# Patient Record
Sex: Female | Born: 1960 | Race: White | Hispanic: No | Marital: Single | Smoking: Current every day smoker
Health system: Southern US, Community
[De-identification: ages and names within clinical notes are randomized; demographics above are authoritative.]

## PROBLEM LIST (undated history)

## (undated) DIAGNOSIS — D649 Anemia, unspecified: Secondary | ICD-10-CM

## (undated) DIAGNOSIS — R0602 Shortness of breath: Secondary | ICD-10-CM

## (undated) DIAGNOSIS — F419 Anxiety disorder, unspecified: Secondary | ICD-10-CM

## (undated) DIAGNOSIS — F329 Major depressive disorder, single episode, unspecified: Secondary | ICD-10-CM

## (undated) DIAGNOSIS — J449 Chronic obstructive pulmonary disease, unspecified: Secondary | ICD-10-CM

## (undated) DIAGNOSIS — M5136 Other intervertebral disc degeneration, lumbar region: Secondary | ICD-10-CM

## (undated) DIAGNOSIS — D473 Essential (hemorrhagic) thrombocythemia: Secondary | ICD-10-CM

## (undated) DIAGNOSIS — F32A Depression, unspecified: Secondary | ICD-10-CM

## (undated) DIAGNOSIS — K219 Gastro-esophageal reflux disease without esophagitis: Secondary | ICD-10-CM

## (undated) DIAGNOSIS — D75839 Thrombocytosis, unspecified: Secondary | ICD-10-CM

## (undated) DIAGNOSIS — M519 Unspecified thoracic, thoracolumbar and lumbosacral intervertebral disc disorder: Secondary | ICD-10-CM

## (undated) DIAGNOSIS — K7460 Unspecified cirrhosis of liver: Secondary | ICD-10-CM

## (undated) DIAGNOSIS — E039 Hypothyroidism, unspecified: Secondary | ICD-10-CM

## (undated) DIAGNOSIS — K766 Portal hypertension: Secondary | ICD-10-CM

## (undated) DIAGNOSIS — J45909 Unspecified asthma, uncomplicated: Secondary | ICD-10-CM

## (undated) DIAGNOSIS — D72829 Elevated white blood cell count, unspecified: Secondary | ICD-10-CM

## (undated) DIAGNOSIS — I10 Essential (primary) hypertension: Secondary | ICD-10-CM

## (undated) DIAGNOSIS — M51369 Other intervertebral disc degeneration, lumbar region without mention of lumbar back pain or lower extremity pain: Secondary | ICD-10-CM

## (undated) HISTORY — DX: Unspecified cirrhosis of liver: K74.60

## (undated) HISTORY — PX: BRAIN SURGERY: SHX531

## (undated) HISTORY — DX: Thrombocytosis, unspecified: D75.839

## (undated) HISTORY — PX: APPENDECTOMY: SHX54

## (undated) HISTORY — DX: Elevated white blood cell count, unspecified: D72.829

## (undated) HISTORY — DX: Gastro-esophageal reflux disease without esophagitis: K21.9

## (undated) HISTORY — DX: Essential (hemorrhagic) thrombocythemia: D47.3

## (undated) HISTORY — PX: ABDOMINAL HYSTERECTOMY: SHX81

## (undated) HISTORY — PX: OTHER SURGICAL HISTORY: SHX169

## (undated) HISTORY — DX: Unspecified thoracic, thoracolumbar and lumbosacral intervertebral disc disorder: M51.9

## (undated) HISTORY — PX: TOTAL ABDOMINAL HYSTERECTOMY W/ BILATERAL SALPINGOOPHORECTOMY: SHX83

## (undated) HISTORY — PX: BACK SURGERY: SHX140

---

## 1998-05-07 ENCOUNTER — Emergency Department (HOSPITAL_COMMUNITY): Admission: EM | Admit: 1998-05-07 | Discharge: 1998-05-07 | Payer: Self-pay | Admitting: Emergency Medicine

## 1999-02-21 ENCOUNTER — Emergency Department (HOSPITAL_COMMUNITY): Admission: EM | Admit: 1999-02-21 | Discharge: 1999-02-21 | Payer: Self-pay | Admitting: Emergency Medicine

## 1999-02-22 ENCOUNTER — Encounter: Payer: Self-pay | Admitting: Emergency Medicine

## 1999-03-05 ENCOUNTER — Emergency Department (HOSPITAL_COMMUNITY): Admission: EM | Admit: 1999-03-05 | Discharge: 1999-03-05 | Payer: Self-pay | Admitting: Emergency Medicine

## 1999-07-02 ENCOUNTER — Emergency Department (HOSPITAL_COMMUNITY): Admission: EM | Admit: 1999-07-02 | Discharge: 1999-07-02 | Payer: Self-pay | Admitting: Emergency Medicine

## 1999-07-02 ENCOUNTER — Encounter: Payer: Self-pay | Admitting: Emergency Medicine

## 1999-10-04 ENCOUNTER — Ambulatory Visit (HOSPITAL_COMMUNITY): Admission: RE | Admit: 1999-10-04 | Discharge: 1999-10-04 | Payer: Self-pay | Admitting: Otolaryngology

## 1999-10-04 ENCOUNTER — Encounter: Payer: Self-pay | Admitting: Otolaryngology

## 1999-10-20 ENCOUNTER — Emergency Department (HOSPITAL_COMMUNITY): Admission: EM | Admit: 1999-10-20 | Discharge: 1999-10-20 | Payer: Self-pay | Admitting: Emergency Medicine

## 1999-12-04 ENCOUNTER — Ambulatory Visit (HOSPITAL_BASED_OUTPATIENT_CLINIC_OR_DEPARTMENT_OTHER): Admission: RE | Admit: 1999-12-04 | Discharge: 1999-12-04 | Payer: Self-pay | Admitting: Otolaryngology

## 2000-04-07 ENCOUNTER — Emergency Department (HOSPITAL_COMMUNITY): Admission: EM | Admit: 2000-04-07 | Discharge: 2000-04-08 | Payer: Self-pay | Admitting: Emergency Medicine

## 2000-04-08 ENCOUNTER — Encounter: Payer: Self-pay | Admitting: Emergency Medicine

## 2000-05-25 ENCOUNTER — Emergency Department (HOSPITAL_COMMUNITY): Admission: EM | Admit: 2000-05-25 | Discharge: 2000-05-25 | Payer: Self-pay | Admitting: Emergency Medicine

## 2000-08-23 ENCOUNTER — Emergency Department (HOSPITAL_COMMUNITY): Admission: EM | Admit: 2000-08-23 | Discharge: 2000-08-23 | Payer: Self-pay | Admitting: Emergency Medicine

## 2000-08-23 ENCOUNTER — Encounter: Payer: Self-pay | Admitting: Emergency Medicine

## 2000-09-22 ENCOUNTER — Emergency Department (HOSPITAL_COMMUNITY): Admission: EM | Admit: 2000-09-22 | Discharge: 2000-09-22 | Payer: Self-pay | Admitting: *Deleted

## 2000-09-27 ENCOUNTER — Ambulatory Visit (HOSPITAL_COMMUNITY): Admission: RE | Admit: 2000-09-27 | Discharge: 2000-09-27 | Payer: Self-pay | Admitting: Family Medicine

## 2000-09-27 ENCOUNTER — Encounter: Payer: Self-pay | Admitting: Family Medicine

## 2000-09-30 ENCOUNTER — Encounter: Payer: Self-pay | Admitting: Neurosurgery

## 2000-09-30 ENCOUNTER — Ambulatory Visit (HOSPITAL_COMMUNITY): Admission: RE | Admit: 2000-09-30 | Discharge: 2000-09-30 | Payer: Self-pay | Admitting: Neurosurgery

## 2000-10-11 ENCOUNTER — Encounter: Payer: Self-pay | Admitting: Neurosurgery

## 2000-10-11 ENCOUNTER — Encounter: Admission: RE | Admit: 2000-10-11 | Discharge: 2000-10-11 | Payer: Self-pay | Admitting: Neurosurgery

## 2000-10-25 ENCOUNTER — Encounter: Payer: Self-pay | Admitting: Neurosurgery

## 2000-10-25 ENCOUNTER — Encounter: Admission: RE | Admit: 2000-10-25 | Discharge: 2000-10-25 | Payer: Self-pay | Admitting: Neurosurgery

## 2000-11-01 ENCOUNTER — Encounter: Payer: Self-pay | Admitting: Neurosurgery

## 2000-11-01 ENCOUNTER — Encounter: Admission: RE | Admit: 2000-11-01 | Discharge: 2000-11-01 | Payer: Self-pay | Admitting: Neurosurgery

## 2000-11-14 ENCOUNTER — Encounter: Payer: Self-pay | Admitting: Neurosurgery

## 2000-11-14 ENCOUNTER — Encounter: Admission: RE | Admit: 2000-11-14 | Discharge: 2000-11-14 | Payer: Self-pay | Admitting: Neurosurgery

## 2001-01-01 ENCOUNTER — Encounter: Admission: RE | Admit: 2001-01-01 | Discharge: 2001-01-01 | Payer: Self-pay | Admitting: Neurosurgery

## 2001-01-02 ENCOUNTER — Encounter: Admission: RE | Admit: 2001-01-02 | Discharge: 2001-01-07 | Payer: Self-pay | Admitting: Neurosurgery

## 2001-11-29 ENCOUNTER — Emergency Department (HOSPITAL_COMMUNITY): Admission: EM | Admit: 2001-11-29 | Discharge: 2001-11-29 | Payer: Self-pay | Admitting: Emergency Medicine

## 2001-11-29 ENCOUNTER — Encounter: Payer: Self-pay | Admitting: Emergency Medicine

## 2001-12-12 ENCOUNTER — Encounter: Payer: Self-pay | Admitting: Family Medicine

## 2001-12-12 ENCOUNTER — Ambulatory Visit (HOSPITAL_COMMUNITY): Admission: RE | Admit: 2001-12-12 | Discharge: 2001-12-12 | Payer: Self-pay | Admitting: Family Medicine

## 2002-02-05 ENCOUNTER — Encounter: Admission: RE | Admit: 2002-02-05 | Discharge: 2002-02-05 | Payer: Self-pay | Admitting: Neurology

## 2002-02-05 ENCOUNTER — Encounter: Payer: Self-pay | Admitting: Neurology

## 2002-02-12 ENCOUNTER — Encounter: Payer: Self-pay | Admitting: Neurology

## 2002-02-12 ENCOUNTER — Ambulatory Visit (HOSPITAL_COMMUNITY): Admission: RE | Admit: 2002-02-12 | Discharge: 2002-02-12 | Payer: Self-pay | Admitting: Neurology

## 2002-03-04 ENCOUNTER — Encounter: Payer: Self-pay | Admitting: Neurology

## 2002-03-04 ENCOUNTER — Ambulatory Visit (HOSPITAL_COMMUNITY): Admission: RE | Admit: 2002-03-04 | Discharge: 2002-03-04 | Payer: Self-pay | Admitting: Neurology

## 2002-03-30 ENCOUNTER — Ambulatory Visit (HOSPITAL_COMMUNITY): Admission: RE | Admit: 2002-03-30 | Discharge: 2002-03-30 | Payer: Self-pay | Admitting: Family Medicine

## 2002-03-30 ENCOUNTER — Encounter: Payer: Self-pay | Admitting: Family Medicine

## 2002-05-14 ENCOUNTER — Encounter: Payer: Self-pay | Admitting: Family Medicine

## 2002-05-14 ENCOUNTER — Ambulatory Visit (HOSPITAL_COMMUNITY): Admission: RE | Admit: 2002-05-14 | Discharge: 2002-05-14 | Payer: Self-pay | Admitting: Family Medicine

## 2002-06-18 ENCOUNTER — Ambulatory Visit (HOSPITAL_COMMUNITY): Admission: RE | Admit: 2002-06-18 | Discharge: 2002-06-18 | Payer: Self-pay | Admitting: Neurology

## 2002-07-09 ENCOUNTER — Encounter: Admission: RE | Admit: 2002-07-09 | Discharge: 2002-10-07 | Payer: Self-pay

## 2002-07-29 ENCOUNTER — Emergency Department (HOSPITAL_COMMUNITY): Admission: EM | Admit: 2002-07-29 | Discharge: 2002-07-29 | Payer: Self-pay | Admitting: Emergency Medicine

## 2002-07-29 ENCOUNTER — Encounter: Payer: Self-pay | Admitting: Emergency Medicine

## 2002-10-28 ENCOUNTER — Encounter: Payer: Self-pay | Admitting: Family Medicine

## 2002-10-28 ENCOUNTER — Encounter: Admission: RE | Admit: 2002-10-28 | Discharge: 2002-10-28 | Payer: Self-pay | Admitting: Family Medicine

## 2003-01-19 ENCOUNTER — Encounter: Payer: Self-pay | Admitting: Family Medicine

## 2003-01-19 ENCOUNTER — Ambulatory Visit (HOSPITAL_COMMUNITY): Admission: RE | Admit: 2003-01-19 | Discharge: 2003-01-19 | Payer: Self-pay | Admitting: Family Medicine

## 2003-02-08 ENCOUNTER — Emergency Department (HOSPITAL_COMMUNITY): Admission: EM | Admit: 2003-02-08 | Discharge: 2003-02-08 | Payer: Self-pay | Admitting: *Deleted

## 2003-02-08 ENCOUNTER — Encounter: Payer: Self-pay | Admitting: *Deleted

## 2003-02-10 ENCOUNTER — Inpatient Hospital Stay (HOSPITAL_COMMUNITY): Admission: EM | Admit: 2003-02-10 | Discharge: 2003-02-11 | Payer: Self-pay | Admitting: Emergency Medicine

## 2003-02-10 ENCOUNTER — Encounter: Payer: Self-pay | Admitting: Emergency Medicine

## 2003-03-26 ENCOUNTER — Encounter: Payer: Self-pay | Admitting: Neurological Surgery

## 2003-03-26 ENCOUNTER — Encounter: Admission: RE | Admit: 2003-03-26 | Discharge: 2003-03-26 | Payer: Self-pay | Admitting: Neurological Surgery

## 2003-03-29 ENCOUNTER — Emergency Department (HOSPITAL_COMMUNITY): Admission: EM | Admit: 2003-03-29 | Discharge: 2003-03-29 | Payer: Self-pay | Admitting: Emergency Medicine

## 2003-03-29 ENCOUNTER — Ambulatory Visit: Admission: RE | Admit: 2003-03-29 | Discharge: 2003-03-29 | Payer: Self-pay | Admitting: Neurosurgery

## 2003-03-29 ENCOUNTER — Encounter: Payer: Self-pay | Admitting: Emergency Medicine

## 2003-05-21 ENCOUNTER — Encounter: Admission: RE | Admit: 2003-05-21 | Discharge: 2003-05-21 | Payer: Self-pay | Admitting: Neurological Surgery

## 2003-06-07 ENCOUNTER — Emergency Department (HOSPITAL_COMMUNITY): Admission: EM | Admit: 2003-06-07 | Discharge: 2003-06-07 | Payer: Self-pay | Admitting: Emergency Medicine

## 2003-08-06 ENCOUNTER — Ambulatory Visit (HOSPITAL_COMMUNITY): Admission: RE | Admit: 2003-08-06 | Discharge: 2003-08-06 | Payer: Self-pay | Admitting: Family Medicine

## 2003-12-08 ENCOUNTER — Encounter (HOSPITAL_COMMUNITY): Admission: RE | Admit: 2003-12-08 | Discharge: 2004-01-07 | Payer: Self-pay | Admitting: Family Medicine

## 2003-12-20 ENCOUNTER — Ambulatory Visit (HOSPITAL_COMMUNITY): Admission: RE | Admit: 2003-12-20 | Discharge: 2003-12-20 | Payer: Self-pay | Admitting: Family Medicine

## 2004-02-28 ENCOUNTER — Ambulatory Visit: Payer: Self-pay | Admitting: Psychiatry

## 2004-03-07 ENCOUNTER — Ambulatory Visit: Payer: Self-pay | Admitting: Psychiatry

## 2004-04-24 ENCOUNTER — Ambulatory Visit: Payer: Self-pay | Admitting: Psychiatry

## 2004-05-03 ENCOUNTER — Ambulatory Visit: Payer: Self-pay | Admitting: Psychiatry

## 2004-07-25 ENCOUNTER — Ambulatory Visit: Payer: Self-pay | Admitting: Psychiatry

## 2005-06-29 ENCOUNTER — Emergency Department (HOSPITAL_COMMUNITY): Admission: EM | Admit: 2005-06-29 | Discharge: 2005-06-29 | Payer: Self-pay | Admitting: Emergency Medicine

## 2005-08-03 ENCOUNTER — Inpatient Hospital Stay (HOSPITAL_COMMUNITY): Admission: EM | Admit: 2005-08-03 | Discharge: 2005-08-09 | Payer: Self-pay | Admitting: Emergency Medicine

## 2005-08-08 ENCOUNTER — Ambulatory Visit: Payer: Self-pay | Admitting: Internal Medicine

## 2005-08-08 HISTORY — PX: ESOPHAGOGASTRODUODENOSCOPY: SHX1529

## 2005-10-02 ENCOUNTER — Ambulatory Visit (HOSPITAL_COMMUNITY): Admission: RE | Admit: 2005-10-02 | Discharge: 2005-10-02 | Payer: Self-pay | Admitting: Family Medicine

## 2005-10-05 ENCOUNTER — Ambulatory Visit (HOSPITAL_COMMUNITY): Payer: Self-pay | Admitting: Psychiatry

## 2005-10-09 ENCOUNTER — Ambulatory Visit (HOSPITAL_COMMUNITY): Payer: Self-pay | Admitting: Psychiatry

## 2005-10-11 ENCOUNTER — Ambulatory Visit (HOSPITAL_COMMUNITY): Payer: Self-pay | Admitting: Psychiatry

## 2005-10-18 ENCOUNTER — Ambulatory Visit (HOSPITAL_COMMUNITY): Payer: Self-pay | Admitting: Psychiatry

## 2005-11-05 ENCOUNTER — Emergency Department (HOSPITAL_COMMUNITY): Admission: RE | Admit: 2005-11-05 | Discharge: 2005-11-05 | Payer: Self-pay | Admitting: Family Medicine

## 2005-11-08 ENCOUNTER — Ambulatory Visit (HOSPITAL_COMMUNITY): Payer: Self-pay | Admitting: Psychiatry

## 2005-11-09 ENCOUNTER — Ambulatory Visit (HOSPITAL_COMMUNITY): Payer: Self-pay | Admitting: Psychiatry

## 2005-11-14 ENCOUNTER — Ambulatory Visit (HOSPITAL_COMMUNITY): Payer: Self-pay | Admitting: Psychiatry

## 2005-12-24 ENCOUNTER — Encounter (HOSPITAL_COMMUNITY): Admission: RE | Admit: 2005-12-24 | Discharge: 2006-01-23 | Payer: Self-pay | Admitting: Family Medicine

## 2006-01-16 ENCOUNTER — Ambulatory Visit (HOSPITAL_COMMUNITY): Admission: RE | Admit: 2006-01-16 | Discharge: 2006-01-16 | Payer: Self-pay | Admitting: Otolaryngology

## 2006-01-23 ENCOUNTER — Ambulatory Visit (HOSPITAL_COMMUNITY): Payer: Self-pay | Admitting: Psychiatry

## 2006-01-29 ENCOUNTER — Ambulatory Visit (HOSPITAL_COMMUNITY): Admission: RE | Admit: 2006-01-29 | Discharge: 2006-01-29 | Payer: Self-pay | Admitting: Family Medicine

## 2006-02-25 ENCOUNTER — Ambulatory Visit (HOSPITAL_COMMUNITY): Payer: Self-pay | Admitting: Psychiatry

## 2006-02-26 ENCOUNTER — Ambulatory Visit (HOSPITAL_COMMUNITY): Payer: Self-pay | Admitting: Psychiatry

## 2006-03-11 ENCOUNTER — Ambulatory Visit (HOSPITAL_COMMUNITY): Payer: Self-pay | Admitting: Psychiatry

## 2006-03-12 ENCOUNTER — Ambulatory Visit (HOSPITAL_COMMUNITY): Payer: Self-pay | Admitting: Psychiatry

## 2006-03-19 ENCOUNTER — Ambulatory Visit (HOSPITAL_COMMUNITY): Payer: Self-pay | Admitting: Psychiatry

## 2006-03-25 ENCOUNTER — Ambulatory Visit (HOSPITAL_COMMUNITY): Payer: Self-pay | Admitting: Psychiatry

## 2006-03-26 ENCOUNTER — Ambulatory Visit (HOSPITAL_COMMUNITY): Payer: Self-pay | Admitting: Psychiatry

## 2006-04-22 ENCOUNTER — Ambulatory Visit (HOSPITAL_COMMUNITY): Payer: Self-pay | Admitting: Psychiatry

## 2006-04-30 ENCOUNTER — Ambulatory Visit (HOSPITAL_COMMUNITY): Payer: Self-pay | Admitting: Psychiatry

## 2006-06-04 HISTORY — PX: COLONOSCOPY: SHX174

## 2006-12-31 ENCOUNTER — Ambulatory Visit (HOSPITAL_COMMUNITY): Admission: RE | Admit: 2006-12-31 | Discharge: 2006-12-31 | Payer: Self-pay | Admitting: Family Medicine

## 2007-01-06 ENCOUNTER — Encounter (HOSPITAL_COMMUNITY): Admission: RE | Admit: 2007-01-06 | Discharge: 2007-02-05 | Payer: Self-pay | Admitting: Family Medicine

## 2007-01-07 ENCOUNTER — Emergency Department (HOSPITAL_COMMUNITY): Admission: EM | Admit: 2007-01-07 | Discharge: 2007-01-07 | Payer: Self-pay | Admitting: *Deleted

## 2007-01-30 ENCOUNTER — Ambulatory Visit: Payer: Self-pay | Admitting: Internal Medicine

## 2007-01-31 ENCOUNTER — Ambulatory Visit (HOSPITAL_COMMUNITY): Admission: RE | Admit: 2007-01-31 | Discharge: 2007-01-31 | Payer: Self-pay | Admitting: Internal Medicine

## 2007-02-06 ENCOUNTER — Ambulatory Visit (HOSPITAL_COMMUNITY): Admission: RE | Admit: 2007-02-06 | Discharge: 2007-02-06 | Payer: Self-pay | Admitting: Gastroenterology

## 2007-02-10 ENCOUNTER — Emergency Department (HOSPITAL_COMMUNITY): Admission: EM | Admit: 2007-02-10 | Discharge: 2007-02-10 | Payer: Self-pay | Admitting: *Deleted

## 2007-02-11 ENCOUNTER — Ambulatory Visit: Payer: Self-pay | Admitting: Internal Medicine

## 2007-02-12 ENCOUNTER — Ambulatory Visit (HOSPITAL_COMMUNITY): Admission: RE | Admit: 2007-02-12 | Discharge: 2007-02-12 | Payer: Self-pay | Admitting: Internal Medicine

## 2007-02-14 ENCOUNTER — Ambulatory Visit: Payer: Self-pay | Admitting: Gastroenterology

## 2007-02-21 ENCOUNTER — Ambulatory Visit: Payer: Self-pay | Admitting: Internal Medicine

## 2007-02-21 ENCOUNTER — Ambulatory Visit (HOSPITAL_COMMUNITY): Admission: RE | Admit: 2007-02-21 | Discharge: 2007-02-21 | Payer: Self-pay | Admitting: Internal Medicine

## 2007-02-21 ENCOUNTER — Encounter: Payer: Self-pay | Admitting: Internal Medicine

## 2007-02-21 HISTORY — PX: COLONOSCOPY: SHX174

## 2007-03-28 ENCOUNTER — Emergency Department (HOSPITAL_COMMUNITY): Admission: EM | Admit: 2007-03-28 | Discharge: 2007-03-28 | Payer: Self-pay | Admitting: Emergency Medicine

## 2007-04-14 ENCOUNTER — Encounter (HOSPITAL_COMMUNITY): Admission: RE | Admit: 2007-04-14 | Discharge: 2007-05-14 | Payer: Self-pay | Admitting: Oncology

## 2007-04-14 ENCOUNTER — Emergency Department (HOSPITAL_COMMUNITY): Admission: EM | Admit: 2007-04-14 | Discharge: 2007-04-14 | Payer: Self-pay | Admitting: Emergency Medicine

## 2007-04-14 ENCOUNTER — Ambulatory Visit (HOSPITAL_COMMUNITY): Payer: Self-pay | Admitting: Oncology

## 2007-05-19 ENCOUNTER — Encounter (HOSPITAL_COMMUNITY): Admission: RE | Admit: 2007-05-19 | Discharge: 2007-06-04 | Payer: Self-pay | Admitting: Oncology

## 2007-07-06 ENCOUNTER — Inpatient Hospital Stay (HOSPITAL_COMMUNITY): Admission: EM | Admit: 2007-07-06 | Discharge: 2007-07-07 | Payer: Self-pay | Admitting: Emergency Medicine

## 2007-09-18 ENCOUNTER — Encounter (HOSPITAL_COMMUNITY): Admission: RE | Admit: 2007-09-18 | Discharge: 2007-10-18 | Payer: Self-pay | Admitting: Oncology

## 2007-09-18 ENCOUNTER — Ambulatory Visit (HOSPITAL_COMMUNITY): Payer: Self-pay | Admitting: Oncology

## 2007-10-13 ENCOUNTER — Ambulatory Visit (HOSPITAL_COMMUNITY): Payer: Self-pay | Admitting: Oncology

## 2007-10-31 ENCOUNTER — Ambulatory Visit (HOSPITAL_COMMUNITY): Admission: RE | Admit: 2007-10-31 | Discharge: 2007-10-31 | Payer: Self-pay | Admitting: Family Medicine

## 2008-01-01 ENCOUNTER — Encounter (HOSPITAL_COMMUNITY): Admission: RE | Admit: 2008-01-01 | Discharge: 2008-01-31 | Payer: Self-pay | Admitting: Oncology

## 2008-02-06 ENCOUNTER — Encounter: Payer: Self-pay | Admitting: Orthopedic Surgery

## 2008-02-06 ENCOUNTER — Emergency Department (HOSPITAL_COMMUNITY): Admission: EM | Admit: 2008-02-06 | Discharge: 2008-02-06 | Payer: Self-pay | Admitting: Emergency Medicine

## 2008-03-03 ENCOUNTER — Ambulatory Visit: Payer: Self-pay | Admitting: Orthopedic Surgery

## 2008-03-03 DIAGNOSIS — G5790 Unspecified mononeuropathy of unspecified lower limb: Secondary | ICD-10-CM | POA: Insufficient documentation

## 2008-03-03 DIAGNOSIS — G5600 Carpal tunnel syndrome, unspecified upper limb: Secondary | ICD-10-CM | POA: Insufficient documentation

## 2008-03-11 ENCOUNTER — Ambulatory Visit (HOSPITAL_COMMUNITY): Payer: Self-pay | Admitting: Oncology

## 2008-03-11 ENCOUNTER — Encounter (HOSPITAL_COMMUNITY): Admission: RE | Admit: 2008-03-11 | Discharge: 2008-04-10 | Payer: Self-pay | Admitting: Oncology

## 2008-03-12 ENCOUNTER — Telehealth: Payer: Self-pay | Admitting: Orthopedic Surgery

## 2008-03-15 ENCOUNTER — Telehealth: Payer: Self-pay | Admitting: Orthopedic Surgery

## 2008-09-07 ENCOUNTER — Ambulatory Visit (HOSPITAL_COMMUNITY): Admission: RE | Admit: 2008-09-07 | Discharge: 2008-09-07 | Payer: Self-pay | Admitting: Family Medicine

## 2008-09-11 ENCOUNTER — Emergency Department (HOSPITAL_COMMUNITY): Admission: EM | Admit: 2008-09-11 | Discharge: 2008-09-11 | Payer: Self-pay | Admitting: Emergency Medicine

## 2008-09-15 ENCOUNTER — Encounter (HOSPITAL_COMMUNITY): Admission: RE | Admit: 2008-09-15 | Discharge: 2008-10-15 | Payer: Self-pay | Admitting: Oncology

## 2008-09-15 ENCOUNTER — Ambulatory Visit (HOSPITAL_COMMUNITY): Payer: Self-pay | Admitting: Oncology

## 2008-10-04 ENCOUNTER — Encounter (HOSPITAL_COMMUNITY): Admission: RE | Admit: 2008-10-04 | Discharge: 2008-11-03 | Payer: Self-pay | Admitting: Family Medicine

## 2009-02-14 ENCOUNTER — Encounter (INDEPENDENT_AMBULATORY_CARE_PROVIDER_SITE_OTHER): Payer: Self-pay | Admitting: *Deleted

## 2009-03-07 DIAGNOSIS — Z8719 Personal history of other diseases of the digestive system: Secondary | ICD-10-CM | POA: Insufficient documentation

## 2009-03-07 DIAGNOSIS — R197 Diarrhea, unspecified: Secondary | ICD-10-CM | POA: Insufficient documentation

## 2009-03-07 DIAGNOSIS — R109 Unspecified abdominal pain: Secondary | ICD-10-CM | POA: Insufficient documentation

## 2009-03-07 DIAGNOSIS — K921 Melena: Secondary | ICD-10-CM | POA: Insufficient documentation

## 2009-03-07 DIAGNOSIS — Z86010 Personal history of colonic polyps: Secondary | ICD-10-CM | POA: Insufficient documentation

## 2009-03-07 DIAGNOSIS — R112 Nausea with vomiting, unspecified: Secondary | ICD-10-CM | POA: Insufficient documentation

## 2009-04-04 ENCOUNTER — Emergency Department (HOSPITAL_COMMUNITY): Admission: EM | Admit: 2009-04-04 | Discharge: 2009-04-04 | Payer: Self-pay | Admitting: Emergency Medicine

## 2009-04-05 ENCOUNTER — Emergency Department (HOSPITAL_COMMUNITY): Admission: EM | Admit: 2009-04-05 | Discharge: 2009-04-06 | Payer: Self-pay | Admitting: Emergency Medicine

## 2009-06-04 HISTORY — PX: ESOPHAGOGASTRODUODENOSCOPY: SHX1529

## 2009-06-28 ENCOUNTER — Encounter: Payer: Self-pay | Admitting: Gastroenterology

## 2009-06-28 ENCOUNTER — Ambulatory Visit: Payer: Self-pay | Admitting: Internal Medicine

## 2009-06-28 DIAGNOSIS — K920 Hematemesis: Secondary | ICD-10-CM | POA: Insufficient documentation

## 2009-06-29 ENCOUNTER — Telehealth (INDEPENDENT_AMBULATORY_CARE_PROVIDER_SITE_OTHER): Payer: Self-pay | Admitting: *Deleted

## 2009-07-01 ENCOUNTER — Encounter: Payer: Self-pay | Admitting: Internal Medicine

## 2009-07-01 LAB — CONVERTED CEMR LAB
Amylase: 26 units/L (ref 0–105)
BUN: 8 mg/dL (ref 6–23)
Basophils Relative: 1 % (ref 0–1)
CO2: 22 meq/L (ref 19–32)
Calcium: 9.7 mg/dL (ref 8.4–10.5)
Chloride: 103 meq/L (ref 96–112)
Creatinine, Ser: 0.49 mg/dL (ref 0.40–1.50)
Eosinophils Absolute: 0.6 10*3/uL (ref 0.0–0.7)
Eosinophils Relative: 4 % (ref 0–5)
Glucose, Bld: 94 mg/dL (ref 70–99)
HCT: 39.8 % (ref 39.0–52.0)
IgA: 168 mg/dL (ref 68–378)
Lymphs Abs: 4.8 10*3/uL — ABNORMAL HIGH (ref 0.7–4.0)
MCHC: 32.4 g/dL (ref 30.0–36.0)
MCV: 94.8 fL (ref 78.0–100.0)
Monocytes Relative: 7 % (ref 3–12)
Platelets: 533 10*3/uL — ABNORMAL HIGH (ref 150–400)
Tissue Transglutaminase Ab, IgA: 0.5 units (ref <7)
WBC: 13.2 10*3/uL — ABNORMAL HIGH (ref 4.0–10.5)

## 2009-07-07 ENCOUNTER — Encounter: Payer: Self-pay | Admitting: Internal Medicine

## 2009-07-07 ENCOUNTER — Encounter (HOSPITAL_COMMUNITY): Admission: RE | Admit: 2009-07-07 | Discharge: 2009-08-06 | Payer: Self-pay | Admitting: Oncology

## 2009-07-07 ENCOUNTER — Ambulatory Visit (HOSPITAL_COMMUNITY): Payer: Self-pay | Admitting: Oncology

## 2009-07-11 ENCOUNTER — Ambulatory Visit (HOSPITAL_COMMUNITY): Admission: RE | Admit: 2009-07-11 | Discharge: 2009-07-11 | Payer: Self-pay | Admitting: Internal Medicine

## 2009-07-11 ENCOUNTER — Ambulatory Visit: Payer: Self-pay | Admitting: Internal Medicine

## 2009-07-18 ENCOUNTER — Encounter (INDEPENDENT_AMBULATORY_CARE_PROVIDER_SITE_OTHER): Payer: Self-pay

## 2009-07-28 ENCOUNTER — Encounter (INDEPENDENT_AMBULATORY_CARE_PROVIDER_SITE_OTHER): Payer: Self-pay | Admitting: *Deleted

## 2009-08-04 ENCOUNTER — Encounter (HOSPITAL_COMMUNITY): Admission: RE | Admit: 2009-08-04 | Discharge: 2009-09-03 | Payer: Self-pay | Admitting: Oncology

## 2009-10-12 ENCOUNTER — Encounter (HOSPITAL_COMMUNITY): Admission: RE | Admit: 2009-10-12 | Discharge: 2009-11-11 | Payer: Self-pay | Admitting: Family Medicine

## 2009-10-19 ENCOUNTER — Encounter: Payer: Self-pay | Admitting: Internal Medicine

## 2009-10-24 ENCOUNTER — Ambulatory Visit: Payer: Self-pay | Admitting: Internal Medicine

## 2009-10-24 DIAGNOSIS — R7401 Elevation of levels of liver transaminase levels: Secondary | ICD-10-CM | POA: Insufficient documentation

## 2009-10-24 DIAGNOSIS — R740 Nonspecific elevation of levels of transaminase and lactic acid dehydrogenase [LDH]: Secondary | ICD-10-CM

## 2009-10-25 ENCOUNTER — Encounter: Payer: Self-pay | Admitting: Internal Medicine

## 2009-10-26 ENCOUNTER — Ambulatory Visit (HOSPITAL_COMMUNITY): Admission: RE | Admit: 2009-10-26 | Discharge: 2009-10-26 | Payer: Self-pay | Admitting: Internal Medicine

## 2009-11-03 ENCOUNTER — Encounter: Payer: Self-pay | Admitting: Internal Medicine

## 2010-01-09 ENCOUNTER — Ambulatory Visit (HOSPITAL_COMMUNITY): Payer: Self-pay | Admitting: Oncology

## 2010-01-09 ENCOUNTER — Encounter (HOSPITAL_COMMUNITY): Admission: RE | Admit: 2010-01-09 | Discharge: 2010-02-09 | Payer: Self-pay | Admitting: Oncology

## 2010-01-10 ENCOUNTER — Ambulatory Visit (HOSPITAL_COMMUNITY): Payer: Self-pay | Admitting: Oncology

## 2010-06-15 ENCOUNTER — Ambulatory Visit (HOSPITAL_COMMUNITY)
Admission: RE | Admit: 2010-06-15 | Discharge: 2010-06-15 | Payer: Self-pay | Source: Home / Self Care | Attending: Family Medicine | Admitting: Family Medicine

## 2010-06-24 ENCOUNTER — Encounter: Payer: Self-pay | Admitting: Neurological Surgery

## 2010-06-25 ENCOUNTER — Encounter: Payer: Self-pay | Admitting: Family Medicine

## 2010-06-26 ENCOUNTER — Encounter: Payer: Self-pay | Admitting: Family Medicine

## 2010-07-04 NOTE — Letter (Signed)
Summary: REFERRAL DR Nobie Putnam  REFERRAL DR Nobie Putnam   Imported By: Diana Eves 07/01/2009 09:44:04  _____________________________________________________________________  External Attachment:    Type:   Image     Comment:   External Document

## 2010-07-04 NOTE — Letter (Signed)
Summary: [facility] APPT CONFIRMATION  [facility] APPT CONFIRMATION   Imported By: Ave Filter 11/03/2009 15:12:25  _____________________________________________________________________  External Attachment:    Type:   Image     Comment:   External Document  Appended Document: [facility] APPT CONFIRMATION Pt informed.

## 2010-07-04 NOTE — Assessment & Plan Note (Signed)
Summary: E30/ABD PAIN/HX OF POLYPS/GU   Visit Type:  Consult Primary Care Provider:  Patrica Duel, MD  Chief Complaint:  abd pain, nausea and vomiting, and diarrhea.  History of Present Illness: Ms. [name] is a 50 y/o WF, patient of Dr. Nobie Putnam, who presents for further evaluation of n/v/d and abdominal pain. She was late for her appt today secondary to these symptoms.  Patient no showed for appt in 10/10 but tells me today that we rescheduled her appt. She c/o several month h/o intractable n/v/d. She has 10-20 BMs per day. She c/o nocturnal diarrhea and incontinence.  Her father helps her change the bed sheets when this occurs. She wakes up nauseated and when she started moving she vomits.  Heartburn is controlled on omeprazole. She complains of hematemesis and refluxing blood when she lays down.  Denies melena or brbpr. She feels like certain foods stick in her chest.  She c/o frequent oral candidiasis and odynophagia when swallowing.  She completed another course of Diflucan last week but says her symptoms are back. She c/o pain around her umbilicus and to the left lower quadrant which occurs daily.  Pain is unrelated to meals or BMs. She continues to have chronic RUQ pain like when we saw her couple of years ago.  Abdomen feels tight.   She was seen in ED twice in 11/10 for abdominal pain. 04/04/09, Tbili 0.6, AP 119, AST 69, ALT 32. 04/06/09 AST 42, ALT 26, lipase 81.  TSH 1.2 in 8/10  ABD U/S 4/10: probable fatty liver, ?hepatomegaly. HIDA 5/10: GB EF 74%. Hepatomegaly.          Current Medications (verified): 1)  Cymbalta 60 Mg Cpep (Duloxetine Hcl) 2)  Fentanyl 50 Mcg/hr Pt72 (Fentanyl) .... Q 3 Days 3)  Metoprolol Succinate 25 Mg Xr24h-Tab (Metoprolol Succinate) .... Once Daily 4)  Promethazine Hcl 25 Mg Tabs (Promethazine Hcl) .... As Needed 5)  Omeprazole 20 Mg Cpdr (Omeprazole) .... Two Times A Day 6)  Metoclopramide Hcl 5 Mg Tabs (Metoclopramide Hcl) .... Ac & Hs 7)   Premarin 1.25 Mg Tabs (Estrogens Conjugated) .... Once Daily 8)  Tizanidine Hcl 4 Mg Tabs (Tizanidine Hcl) .... Two Times A Day 9)  Percocet 10-650 Mg Tabs (Oxycodone-Acetaminophen) .... Q 4 Hrs Prn 10)  Zolpidem Tartrate 10 Mg Tabs (Zolpidem Tartrate) .... At Bedtime 11)  Alprazolam 1 Mg Tabs (Alprazolam) .... Q 8 Hours As Needed 12)  Imitrex Statdose System 6 Mg/0.58ml Kit (Sumatriptan Succinate) .... As Needed Migraines  Allergies (verified): 1)  ! Codeine 2)  ! * All Antibiotics  Past History:  Past Medical History: chronic back pain Leukocytosis, followed by Dr. Mariel Sleet Thrombocytosis, followed by Dr. Mariel Sleet low iron and potassium depression anxiety GERD  DDD  Vitamin D deficiency  Hypertension Alcohol intoxication, hospitalized 2/09  Family History: FH of Cancer:  Family History of Diabetes Family History Coronary Heart Disease female < 85 Family History of Arthritis Hx, family, chronic respiratory condition Hx, family, asthma Father, colon cancer Maternal aunt, pancreatitic cancer Maternal uncle, colon cancer Praternal GF, colon cancer 1/2 sister, cancer  Social History: Patient is single. One child. One grandson. 2ppd. No alcohol. No drugs. Disabled  Review of Systems General:  Denies fever, chills, sweats, anorexia, fatigue, weakness, and weight loss. Eyes:  Denies vision loss. ENT:  Complains of sore throat and difficulty swallowing; denies nasal congestion and hoarseness. CV:  Denies chest pains, angina, palpitations, dyspnea on exertion, and peripheral edema. Resp:  Denies dyspnea at  rest, dyspnea with exercise, and cough. GI:  Complains of difficulty swallowing, pain on swallowing, nausea, vomiting, vomiting blood, abdominal pain, diarrhea, change in bowel habits, and fecal incontinence; denies indigestion/heartburn, jaundice, gas/bloating, constipation, bloody BM's, and black BMs. GU:  Denies urinary burning and blood in urine. MS:  Complains of joint  pain / LOM. Derm:  Denies rash and itching. Neuro:  Denies weakness, paralysis, frequent headaches, memory loss, and confusion. Psych:  Complains of depression and anxiety; denies suicidal ideation. Endo:  Denies unusual weight change. Heme:  Denies bruising and bleeding. Allergy:  Denies hives and rash.  Vital Signs:  Patient profile:   50 year old female Height:      66 inches Temp:     97.8 degrees F oral Pulse rate:   68 / minute BP sitting:   120 / 84  (left arm) Cuff size:   regular  Vitals Entered By: Hendricks Limes LPN (June 28, 2009 9:34 AM)  Physical Exam  General:  obese, laying on the table, no acute distress.  moves slowly with positioning on the exam table. Head:  Normocephalic and atraumatic. Eyes:  Conjunctivae pink, no scleral icterus.  Mouth:  Oropharyngeal mucosa moist, pink.  No lesions, erythema or exudate.   poor dentition.   Neck:  Supple; no masses or thyromegaly. Lungs:  Clear throughout to auscultation. Heart:  Regular rate and rhythm; no murmurs, rubs,  or bruits. Abdomen:  Obese. Positive bowel sounds. Soft. Moderate tenderness in RUQ and LLQ to deep palpation. No rebound or guarding. No HSM or masses appreciated. Extremities:  No clubbing, cyanosis, edema or deformities noted. Neurologic:  Alert and  oriented x4;  grossly normal neurologically. Skin:  Intact without significant lesions or rashes. Cervical Nodes:  No significant cervical adenopathy. Psych:  Alert and cooperative. Normal mood and affect.  Impression & Recommendations:  Problem # 1:  ABDOMINAL PAIN (ICD-789.00) C/O chronic intermittent RUQ pain for years and now 6 month h/o Left sided abd pain associated with N/V/D. C/O hematemesis as well. Gallbladder w/u has been negative. Last EGD over three years ago. Last TCS/TI 9/08. Segmental bx negative for microscopic colitis. She had three polyps, one was tubular adenoma.   With regards to chronic RUQ pain, she has been extensively evaluated  in the past (Abd U/S, HIDA, EUS in 2008, UGI/SBFT).  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  Given c/o hematemesis, she needs EGD. Consider SB bx to r/o Celiac disease.  Right now, there is no clear indication for repeat colonoscopy.  If EGD unrevealing, we will perform CT A/P prior to tertiary care referral.   Orders: T-Lipase [phone]) T-Amylase [phone]) T-Comprehensive Metabolic Panel [phone]) T-CBC w/Diff [phone]) Consultation Level III [phone])  Other Orders: T-igA ([id number]) T-Tissue Transglutamase Ab IgA ([id number]) Prescriptions: ONDANSETRON HCL 4 MG TABS (ONDANSETRON HCL) one by mouth every 6 hours as needed n/v  #30 x 0   Entered and Authorized by:   Leanna Battles. Dixon Boos   Signed by:   Leanna Battles Dixon Boos on 06/28/2009   Method used:   Electronically to        [facility] (retail)       [address]       [place], [place]  [address]       Ph: [phone]       Fax: [phone]   RxID:   [id number]  I would like to  thank Dr. Nobie Putnam for allowing [place] to take part in the care of this nice patient.

## 2010-07-04 NOTE — Letter (Signed)
Summary: External Other  External Other   Imported By: Peggyann Shoals 10/25/2009 13:37:52  _____________________________________________________________________  External Attachment:    Type:   Image     Comment:   External Document

## 2010-07-04 NOTE — Letter (Signed)
Summary: EGD ORDER  EGD ORDER   Imported By: Ricard Dillon 07/07/2009 13:00:50  _____________________________________________________________________  External Attachment:    Type:   Image     Comment:   External Document

## 2010-07-04 NOTE — Letter (Signed)
Summary: External Other  External Other   Imported By: Peggyann Shoals 10/19/2009 10:41:29  _____________________________________________________________________  External Attachment:    Type:   Image     Comment:   External Document

## 2010-07-04 NOTE — Letter (Signed)
Summary: CT SCAN ORDER  CT SCAN ORDER   Imported By: Ave Filter 10/24/2009 11:58:33  _____________________________________________________________________  External Attachment:    Type:   Image     Comment:   External Document

## 2010-07-04 NOTE — Letter (Signed)
Summary: Appointment Reminder  [facility]  [address]   [place], [place] [address]   Phone: [phone]  Fax: [phone]       July 28, 2009   [name] [address] [place], [place]  [address] 30-Oct-1960    Dear [name],  We have been unable to reach you by phone to schedule a follow up   appointment that was recommended for you by Dr. Jena Gauss. It is very   important that we reach you to schedule an appointment. We hope that you  allow [place] to participate in your health care needs. Please contact us at  [phone] at your earliest convenience to schedule your appointment.  Sincerely,    [facility] R. Roetta Sessions, M.D.    Kassie Mends, M.D. Lorenza Burton, FNP-BC    Tana Coast, PA-C Phone: [phone]    Fax: [phone]

## 2010-07-04 NOTE — Assessment & Plan Note (Signed)
Summary: RUQ ABD PAIN,NAUSEA AND VOMITING/SS   Visit Type:  Initial Consult Referring Provider:  [facility] Primary Care Provider:  C[facility]  Chief Complaint:  abd pain/n/v.  History of Present Illness: [name] is here for f/u. Dr. Nobie Putnam asked she come back to see [place]. We tried to see her post-EGD but her phone number was changed and she did not respond to our letter. Since she was here last, she had another HIDA which was normal. Labs from 09/30/09-->Tbili 0.6, AP 124, AST 73, ALT 30, alb 4.3, WBC 15,800, Plt 588,000, H/H 14/42.5, amylase 31, lipase 19. Notably, we checked her LFTS on 07/11/09 and they were normal. In 1/11 her AP was 145, AST 88.   She is upset today because Dr. Nobie Putnam is retiring. She is going to start seeing Dr. Phillips Odor on May 31st. She complains of recurrent n/v with RUQ pain (off/on for over 2 years). She also has LUQ for several months now.  Symptoms usually last for couple weeks at a time, she will alter her diet to clear liquids when it occurs. She continues to have 5-10 or sometimes more, loose stools daily. Rare brbpr on toilet tissue. No melena. Denies heartburn, but has bitter taste in mouth. She has folate deficiency but has not been able to take folic acid due to hives/itching. Dr. Mariel Sleet manages this for her, I ask that she make sure he knows she is not taking meds.   Current Medications (verified): 1)  Cymbalta 60 Mg Cpep (Duloxetine Hcl) 2)  Fentanyl 50 Mcg/hr Pt72 (Fentanyl) .... Q 3 Days 3)  Metoprolol Succinate 25 Mg Xr24h-Tab (Metoprolol Succinate) .... Once Daily 4)  Promethazine Hcl 25 Mg Tabs (Promethazine Hcl) .... As Needed 5)  Omeprazole 20 Mg Cpdr (Omeprazole) .... Two Times A Day 6)  Metoclopramide Hcl 5 Mg Tabs (Metoclopramide Hcl) .... Ac & Hs 7)  Premarin 1.25 Mg Tabs (Estrogens Conjugated) .... Once Daily 8)  Tizanidine Hcl 4 Mg Tabs (Tizanidine Hcl) .... Two Times A Day 9)  Percocet 10-650 Mg Tabs (Oxycodone-Acetaminophen) .... Q 4 Hrs  Prn 10)  Zolpidem Tartrate 10 Mg Tabs (Zolpidem Tartrate) .... At Bedtime 11)  Alprazolam 1 Mg Tabs (Alprazolam) .... Q 8 Hours As Needed 12)  Imitrex Statdose System 6 Mg/0.61ml Kit (Sumatriptan Succinate) .... As Needed Migraines  Allergies (verified): 1)  ! Codeine 2)  ! * All Antibiotics  Past History:  Past Medical History: chronic back pain Leukocytosis, followed by Dr. Mariel Sleet Thrombocytosis, followed by Dr. Mariel Sleet Folate deficiency, followed by Dr. Mariel Sleet low iron and potassium depression anxiety GERD  DDD  Vitamin D deficiency  Hypertension Alcohol intoxication, hospitalized 2/09 EGD 2/11--> Normal esophagus. Small hiatal hernia. Patchy antral erythema of uncertain significance status post  biopsy. Duodenal diverticulum (D2), status biopsy D2/D3, otherwise D1-D3 appeared normal. Bx negative for H. pylori and Celiac Disease TCS/TI 9/08. Segmental bx negative for microscopic colitis. She had three polyps, one was tubular adenoma. Surveillance exam due 02/2012   Past Surgical History: right knee arthroscopy 1980 hysterectomy 1990, ultimately had both ovaries moved due to endometriosis benign right breast lumpectomy 1980 appendectomy  Review of Systems      See HPI  Vital Signs:  Patient profile:   50 year old female Height:      66 inches Weight:      177 pounds BMI:     28.67 Temp:     97.8 degrees F oral Pulse rate:   88 / minute BP sitting:   98 /  64  (left arm) Cuff size:   regular  Vitals Entered By: Cloria Spring LPN (Oct 24, 2009 11:09 AM)  Physical Exam  General:  Well developed, well nourished, no acute distress. Head:  Normocephalic and atraumatic. Eyes:  sclera nonicteric Mouth:  op moist Abdomen:  obese. positive bs. soft. mild ruq/luq tenderness to deep palpation. mild llq tenderness. no rebound or guarding. no hsm or masses. no abd bruit or hernia Extremities:  No clubbing, cyanosis, edema or deformities noted. Neurologic:  weakness  noted.   Skin:  Intact without significant lesions or rashes. Psych:  depressed affect.    Impression & Recommendations:  Problem # 1:  ABDOMINAL PAIN (ICD-789.00)  C/O chronic intermittent RUQ pain for years and now greater than 6 month h/o Left sided abd pain associated with N/V/D.  Gallbladder w/u has been negative. EGD 2/11 unrevealing. With regards to chronic RUQ pain, she has been extensively evaluated in the past (Abd U/S, HIDA, EUS in 2008, UGI/SBFT).  Recent unremarkable HIDA as well. Next step was referral to [facility] for potential sphincter of Oddi dysfuction.  She has had documented abnormal alk phos and AST on multiple occasions but also with normal values like in 2/11. Prior to tertiary care referral, will obtain CT A/P with IV/oral contrast to further evaluate her symptoms and abnormal lfts.   Orders: Est. Patient Level II (96295) Prescriptions: ONDANSETRON HCL 4 MG TABS (ONDANSETRON HCL) one by mouth every 6 hours as needed n/v  #30 x 0   Entered and Authorized by:   Leanna Battles. Dixon Boos   Signed by:   Leanna Battles Dixon Boos on 10/24/2009   Method used:   Electronically to        [facility] (retail)       [address]       [place], [place]  [address]       Ph: [phone]       Fax: [phone]   RxID:   [id number]

## 2010-07-04 NOTE — Letter (Signed)
Summary: [facility] REFERRAL  [facility] REFERRAL   Imported By: Ave Filter 11/03/2009 09:05:05  _____________________________________________________________________  External Attachment:    Type:   Image     Comment:   External Document

## 2010-07-04 NOTE — Miscellaneous (Signed)
Summary: labs from aph  Clinical Lists Changes L-Hepatic Function Panel (HFP / LFT) - STATUS: Final                                            Perform Date: 7 Feb11 09:06  Ordered By: Jena Gauss MD , Gerrit Friends           Ordered Date: 7 Feb11 09:07                                       Last Updated Date: 7 Feb11 10:00  Facility: [facility]                               Department: GENL  Accession #: [id number]                    USN:       [id number]  Findings  Result Name                              Result     Abnl   Normal Range     Units      Perf. Loc.  Bilirubin, Total                                0.4               0.3-1.2          mg/dL  Bilirubin, Direct                              0.2               0.0-0.3          mg/dL  Indirect Bilirubin                              0.2        l      0.3-0.9          mg/dL  Alkaline Phosphatase                    102               39-117           U/L  SGOT (AST)                                  35                0-37             U/L  SGPT (ALT)                                   24                0-35  U/L  Total  Protein                                 5.9        l      6.0-8.3          g/dL  Albumin-Blood                               3.3        l      3.5-5.2          g/dL  Additional Information  HL7 RESULT STATUS : F  External IF Update Timestamp : 2009-07-11:09:57:00.000000      L-CBC-NO Differential - STATUS: Final                                            Perform Date: 7 Feb11 09:06  Ordered By: Jena Gauss MD , Gerrit Friends           Ordered Date: 7 Feb11 09:06                                       Last Updated Date: 7 Feb11 09:27  Facility: [facility]                               Department: GENL  Accession #: [id number]                    USN:       [id number]  Findings  Result Name                              Result     Abnl   Normal Range     Units      Perf. Loc.  WBC                                          11.5       h      4.0-10.5         K/uL  RBC                                           3.92              3.87-5.11        MIL/uL  Hemoglobin (HGB)                      12.3              12.0-15.0        g/dL  Hematocrit (HCT)                        36.8  36.0-46.0        %  MCV                                          93.7              78.0-100.0       fL  MCHC                                       33.5              30.0-36.0        g/dL  RDW                                         15.1              11.5-15.5        %  Platelet Count (PLT)                    376               150-400          K/uL  Additional Information  HL7 RESULT STATUS : F  External IF Update Timestamp : 2009-07-11:09:23:00.000000

## 2010-07-04 NOTE — Progress Notes (Signed)
Summary: [facility] appointment.  Phone Note Outgoing Call   Call placed by: Waldon Reining,  March 15, 2008 4:49 PM Call placed to: Specialist Action Taken: Appt scheduled Summary of Call: Patient has an appointment with Dr. Gerilyn Pilgrim on 03-31-08 at 10:30. Patient notified. Dr. Romeo Apple to call patient with results.

## 2010-07-04 NOTE — Progress Notes (Signed)
Summary: Instructions for EGD  ---- Converted from flag ---- ---- 06/29/2009 1:14 PM, Leanna Battles. Dixon Boos wrote: please arrange for EGD with propofol with rmr for hematemesis, abd pain, n/v and consider sb biopsy for chronic diarrhea.  please make sure all indications are on order so the sb bx does not get missed. thx. by the way, per rmr no need for tcs right now. ------------------------------  Appended Document: Instructions for EGD Pt scheduled for EGD on 07/11/09@7 :30am  Pt aware of appt.

## 2010-07-10 ENCOUNTER — Encounter (HOSPITAL_COMMUNITY): Admission: RE | Admit: 2010-07-10 | Payer: Self-pay | Source: Home / Self Care | Admitting: Oncology

## 2010-07-10 ENCOUNTER — Other Ambulatory Visit (HOSPITAL_COMMUNITY): Payer: Medicare Other

## 2010-07-11 ENCOUNTER — Ambulatory Visit (HOSPITAL_COMMUNITY): Payer: Medicare Other | Admitting: Oncology

## 2010-08-18 LAB — FOLATE: Folate: >20 ng/mL

## 2010-08-18 LAB — CBC
MCHC: 33.3 g/dL (ref 30.0–36.0)
Platelets: 386 10*3/uL (ref 150–400)
RDW: 13.7 % (ref 11.5–15.5)

## 2010-08-23 LAB — BASIC METABOLIC PANEL
CO2: 25 mEq/L (ref 19–32)
Calcium: 9.5 mg/dL (ref 8.4–10.5)
GFR calc Af Amer: >60 mL/min (ref >60)
Sodium: 137 mEq/L (ref 135–145)

## 2010-08-23 LAB — HEPATIC FUNCTION PANEL
AST: 35 U/L (ref 0–37)
Albumin: 3.3 g/dL — ABNORMAL LOW (ref 3.5–5.2)
Total Bilirubin: 0.4 mg/dL (ref 0.3–1.2)

## 2010-08-23 LAB — DIFFERENTIAL
Eosinophils Absolute: 0.8 10*3/uL — ABNORMAL HIGH (ref 0.0–0.7)
Lymphocytes Relative: 45 % (ref 12–46)
Lymphs Abs: 5.3 10*3/uL — ABNORMAL HIGH (ref 0.7–4.0)
Neutro Abs: 5.6 10*3/uL (ref 1.7–7.7)
Neutrophils Relative %: 48 % (ref 43–77)

## 2010-08-23 LAB — CBC
Hemoglobin: 12.3 g/dL (ref 12.0–15.0)
MCV: 93.3 fL (ref 78.0–100.0)
Platelets: 376 10*3/uL (ref 150–400)
Platelets: 428 10*3/uL — ABNORMAL HIGH (ref 150–400)
RDW: 15.1 % (ref 11.5–15.5)
WBC: 11.8 10*3/uL — ABNORMAL HIGH (ref 4.0–10.5)

## 2010-08-23 LAB — HEMOGLOBIN AND HEMATOCRIT, BLOOD: HCT: 39.2 % (ref 36.0–46.0)

## 2010-08-23 LAB — FOLATE: Folate: 1.8 ng/mL — ABNORMAL LOW

## 2010-08-25 LAB — CBC
HCT: 39.3 % (ref 36.0–46.0)
Hemoglobin: 13.3 g/dL (ref 12.0–15.0)
MCHC: 33.7 g/dL (ref 30.0–36.0)
MCV: 95.4 fL (ref 78.0–100.0)
RBC: 4.13 MIL/uL (ref 3.87–5.11)

## 2010-09-06 LAB — DIFFERENTIAL
Basophils Absolute: 0 10*3/uL (ref 0.0–0.1)
Basophils Relative: 2 % — ABNORMAL HIGH (ref 0–1)
Basophils Relative: 0 % (ref 0–1)
Lymphocytes Relative: 23 % (ref 12–46)
Lymphs Abs: 4.5 10*3/uL — ABNORMAL HIGH (ref 0.7–4.0)
Monocytes Relative: 6 % (ref 3–12)
Monocytes Relative: 3 % (ref 3–12)
Neutro Abs: 7.1 10*3/uL (ref 1.7–7.7)
Neutro Abs: 11.3 10*3/uL — ABNORMAL HIGH (ref 1.7–7.7)
Neutrophils Relative %: 55 % (ref 43–77)
Neutrophils Relative %: 72 % (ref 43–77)

## 2010-09-06 LAB — CBC
HCT: 37.1 % (ref 36.0–46.0)
Hemoglobin: 12.5 g/dL (ref 12.0–15.0)
MCHC: 33.7 g/dL (ref 30.0–36.0)
MCHC: 34.3 g/dL (ref 30.0–36.0)
MCV: 94.3 fL (ref 78.0–100.0)
Platelets: 460 10*3/uL — ABNORMAL HIGH (ref 150–400)
RBC: 4.26 MIL/uL (ref 3.87–5.11)
RDW: 15.1 % (ref 11.5–15.5)

## 2010-09-06 LAB — POCT I-STAT, CHEM 8
BUN: <3 mg/dL — ABNORMAL LOW (ref 6–23)
Calcium, Ion: 1.04 mmol/L — ABNORMAL LOW (ref 1.12–1.32)
Creatinine, Ser: 0.5 mg/dL (ref 0.4–1.2)
Hemoglobin: 14.3 g/dL (ref 12.0–15.0)
TCO2: 24 mmol/L (ref 0–100)

## 2010-09-06 LAB — COMPREHENSIVE METABOLIC PANEL
ALT: 32 U/L (ref 0–35)
AST: 69 U/L — ABNORMAL HIGH (ref 0–37)
Albumin: 3.6 g/dL (ref 3.5–5.2)
BUN: 3 mg/dL — ABNORMAL LOW (ref 6–23)
CO2: 23 mEq/L (ref 19–32)
Calcium: 8.2 mg/dL — ABNORMAL LOW (ref 8.4–10.5)
Calcium: 9.1 mg/dL (ref 8.4–10.5)
Creatinine, Ser: 0.53 mg/dL (ref 0.4–1.2)
GFR calc Af Amer: >60 mL/min (ref >60)
GFR calc non Af Amer: >60 mL/min (ref >60)
GFR calc non Af Amer: >60 mL/min (ref >60)
Glucose, Bld: 87 mg/dL (ref 70–99)
Sodium: 140 mEq/L (ref 135–145)
Total Protein: 6.1 g/dL (ref 6.0–8.3)
Total Protein: 6.8 g/dL (ref 6.0–8.3)

## 2010-09-06 LAB — URINE CULTURE: Culture: NO GROWTH

## 2010-09-06 LAB — ABO/RH: ABO/RH(D): A POS

## 2010-09-06 LAB — TYPE AND SCREEN
ABO/RH(D): A POS
Antibody Screen: NEGATIVE

## 2010-09-06 LAB — URINALYSIS, ROUTINE W REFLEX MICROSCOPIC
Glucose, UA: NEGATIVE mg/dL
Hgb urine dipstick: NEGATIVE
Ketones, ur: NEGATIVE mg/dL
Protein, ur: NEGATIVE mg/dL

## 2010-09-06 LAB — LIPASE, BLOOD: Lipase: 81 U/L — ABNORMAL HIGH (ref 11–59)

## 2010-09-06 LAB — LACTIC ACID, PLASMA: Lactic Acid, Venous: 2.1 mmol/L (ref 0.5–2.2)

## 2010-09-06 LAB — PROTIME-INR
INR: 1 (ref 0.00–1.49)
Prothrombin Time: 13.1 seconds (ref 11.6–15.2)

## 2010-09-13 LAB — CBC
Hemoglobin: 12.8 g/dL (ref 12.0–15.0)
RBC: 4.05 MIL/uL (ref 3.87–5.11)
RDW: 14.6 % (ref 11.5–15.5)
WBC: 13.5 10*3/uL — ABNORMAL HIGH (ref 4.0–10.5)

## 2010-10-17 NOTE — H&P (Signed)
NAMEDEJANEE, [name]             ACCOUNT NO.:  [id number]   MEDICAL RECORD NO.:  [id number]          PATIENT TYPE:  INP   LOCATION:  2631                         FACILITY:  [facility]   PHYSICIAN:  Thomasenia Bottoms, MDDATE OF BIRTH:  Oct 06, 1960   DATE OF ADMISSION:  07/06/2007  DATE OF DISCHARGE:                              HISTORY & PHYSICAL   DATA:  The patient's blood alcohol level is high at 150.  Her sodium is  143, potassium 3.1, chloride 107, bicarb 26, glucose 110, BUN 7,  creatinine 0.62, albumin is 3.8, ALT is 39.  Her white count is 13.6,  hemoglobin 13.4, hematocrit 39.5, platelet count is 469.  Urinalysis is  clear, negative nitrate, negative leukocyte, normal specific gravity.  Urine pregnancy test is negative.  CPK is 43, troponin is 0.02.  Her  urine drug screen is positive for opiates, positive for benzodiazepines,  and positive for cannabinoids.   ASSESSMENT AND PLAN:  1. Unresponsiveness and possible cardiac arrest, likely secondary to      acute alcohol intoxication in the setting of marijuana and narcotic      use.  The report was that the patient received chest compressions      and was pulseless, but she seemed to reverse quite quickly with      Narcan.  The patient denies taking any extra of her prescribed      medications which included Xanax and Percocet, but the patient      certainly did become arousable with Narcan.  The plan is to admit      her to a telemetry-monitored bed overnight with a sitter, to be      sure that all of the alcohol and drugs have left her system, so      that she is not at risk for becoming unresponsive, again.  2. Involuntary commitment, possible suicidal ideation.  Two different      sources, the patient's daughter who was not with her tonight and      the patient both indicated initially that patient has been      suicidal.  The patient later recanted this.  We will re-evaluate in      the morning, but she will be under  involuntary commitment tonight.      I will likely have psychiatry evaluate as well.  3. Hypokalemia.  Will replace.  4. Acute alcohol intoxication.  Will watch her over time.  5. Minimally elevated white blood cell count and platelet count.  As      mentioned above, the patient feels she has leukemia.  I did discuss      at length with the patient that she really should follow up with      the oncologist to get a diagnosis.  This very well may not be a      terminal cancer.      Thomasenia Bottoms, MD  Electronically Signed     CVC/MEDQ  D:  07/06/2007  T:  07/06/2007  Job:  [id number]   cc:   Patrica Duel, M.D.

## 2010-10-17 NOTE — H&P (Signed)
[name], [name]             ACCOUNT NO.:  [id number]   MEDICAL RECORD NO.:  [id number]          PATIENT TYPE:  INP   LOCATION:  2631                         FACILITY:  [facility]   PHYSICIAN:  Thomasenia Bottoms, MDDATE OF BIRTH:  02/23/1961   DATE OF ADMISSION:  07/06/2007  DATE OF DISCHARGE:                              HISTORY & PHYSICAL   CHIEF COMPLAINT:  Unresponsive.   HISTORY OF PRESENT ILLNESS:  [name] is a 50 year old whose friends  called 9-1-1 when she became unresponsive.  The patient is able to tell  me that she lives in [place].  She was here in [place] with a  friend. She had approximately 40 tequila shots tonight and smoked some  marijuana but otherwise took no other drugs or pills.  She said after  doing that, she was sat down to have a slice of pizza, and that is the  last thing she remembers until being in the emergency department. The  report I got from the emergency department physician states that she  became suddenly unresponsive. The people she was with started doing  chest compressions. When EMS arrived, they also started doing chest  compressions, but the patient did become arousable to pain, and chest  compressions were stopped.  She was breathing on her own.  She was  brought to the emergency department, given Narcan.  She became  belligerent after getting the Narcan but was wide awake, talking, had no  memory of the recent events.  However, the patient became very  belligerent and demanded to go home.  When she tried to go home, the  police were called. She was involuntarily committed. Apparently on  arrival, she told the nurse that she wanted to die, and the patient's  daughter also confided in the nurse that the patient had discussed  suicide recently.  Because of these factors, the patient was  involuntarily committed. After sometime in the emergency department when  she was fully awake, she denies or has no recollection of telling  the  nurse that she wanted to die.  She says she would never say that. She is  denying being suicidal. Once she found out she was involuntarily  committed, she became quite angry in the emergency department again, but  her situation was explained, and we will be admitting her.   PAST MEDICAL HISTORY:  Is significant for the fact that the patient  tells me she believes she has terminal cancer.  After further  questioning, it turns out that the patient had an elevated white blood  cell count and elevated platelet count. She saw an oncologist who  recommended a bone marrow biopsy, and. from her report, described that  one of the possibilities is cancer. This scared the patient, and she  never went back.  She has not followed up. That was 4 months ago, and  since that time she has essentially been under the impression that she  has terminal cancer.  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  Does not have a  diagnosis, and she refused to tell me which oncologist she saw. The  computer reveals history of chronic pain syndrome on chronic narcotic  use, though there are no specifics about the chronic pain syndrome. She  had an EGD in March 2007 for recurrent diarrhea, nausea and vomiting  that apparently was within normal limits.  She has a history of anxiety,  depression and history of knee surgery.  The patient was not cooperative  with much of the history, so it does come from the computer.   SOCIAL HISTORY, FAMILY HISTORY, REVIEW OF SYSTEMS:  Are unknown except as mentioned above.  The patient is not cooperative.   PHYSICAL EXAMINATION:  VITAL SIGNS:  On arrival, blood pressure was  141/97, temperature 97.7 rectally, pulse 110.  She is saturating 100% on  2 liters nasal cannula.  GENERAL:   On physical examination, the patient is well nourished, well  developed, no acute distress. At times she is cooperative and pleasant,  and at other times she gets angry, starts yelling, and is quite  belligerent.  HEENT:  Exam is normocephalic, atraumatic.  Sclerae nonicteric.  NECK:  Supple.  CARDIAC:  Exam is regular rate and rhythm.  LUNGS:  Her lungs are clear to auscultation bilaterally with no wheezes,  rhonchi or rales.  ABDOMEN:  Her abdomen is soft, nontender, nondistended.  Normoactive  bowel sounds.  No masses are appreciated.  EXTREMITIES:  Her extremities reveal no evidence of clubbing, cyanosis  or edema.  NEUROLOGIC:  The patient is quite alert and awake.  She has no slurred  speech.  She moves each of her extremities spontaneously.  She took off  her oxygen and was about to get up off the bed without assistance on at  least two occasions during my examination. She can easily go from supine  to sitting quickly. She has no clear focal deficit.  Cranial nerves  appear to be intact.  MUSCULOSKELETAL:  Examination reveals no effusion of her joints.   See rest of History and physical in following dictation.  DICTATION STOPS HERE.      Thomasenia Bottoms, MD  Electronically Signed     CVC/MEDQ  D:  07/06/2007  T:  07/06/2007  Job:  [id number]

## 2010-10-17 NOTE — Op Note (Signed)
NAMESUMIE, REMSEN             ACCOUNT NO.:  [id number]   MEDICAL RECORD NO.:  [id number]          PATIENT TYPE:  AMB   LOCATION:  DAY                           FACILITY:  [facility]   PHYSICIAN:  R. Roetta Sessions, M.D. DATE OF BIRTH:  05-05-1961   DATE OF PROCEDURE:  02/21/2007  DATE OF DISCHARGE:                               OPERATIVE REPORT   PROCEDURE:  Colonoscopy with ileoscopy, hot snare polypectomy, segmental  biopsies, polyp biopsy and stool sampling.   INDICATIONS FOR PROCEDURE:  A 50 year old lady with right upper quadrant  abdominal pain which had been evaluated extensively without a specific  cause being found who also has been having chronic diarrhea and  hematochezia recently.  Colonoscopy is now being done for the latter  reasons. This approach has been discussed the patient at length. The  potential risks, benefits and alternatives have been reviewed, questions  answered and she is agreeable.  Please see the documentation in the  medical record.   MONITORING:  O2 saturation, blood pressure, pulse and respirations were  monitored throughout entire procedure.   CONSCIOUS SEDATION:  Versed 8 mg IV, Demerol 150 mg IV in divided doses,  Phenergan 25 mg diluted slow IV push to augment conscious sedation.   INSTRUMENT:  Pentax video chip system.   FINDINGS:  Digital rectal exam revealed no abnormalities.   ENDOSCOPIC FINDINGS:  The prep was marginal.   COLON:  The colonic mucosa was surveyed from the rectosigmoid junction  through the left transverse and right colon to the appendiceal orifice,  ileocecal valve and cecum.  These structures were well seen and  photographed for the record.  The terminal ileum was intubated to 10 cm.  From this level, the scope was slowly withdrawn. All previously  mentioned mucosal surfaces were again seen. The patient left sided  diverticula. The patient had two polyps in the mid descending colon and  one large hyperplastic-appearing  polyp on a stalk a good 1 cm in  dimensions, please see photos.  The other was 5 mm, both were hot  snared, removed and recovered through the scope. Segmental biopsies of  the ascending colon and sigmoid were taken to rule out microscopic  colitis, also a fresh stool specimen was taken for the microbiology lab.  However, the remainder of the colonic mucosa appeared entirely normal  and there was no evidence of colitis or other abnormality otherwise in  the colon and terminal ileum. The scope was pulled down to the rectum.  A thorough examination of the rectal mucosa including a retroflexed view  of the anal verge demonstrated an anal papilla and internal hemorrhoids  (more likely the cause of the patient's rectum bleeding).  There was a  diminutive polyp at 10 cm from the anal verge which was cold  biopsied/removed. The patient tolerated the procedure well and was  reacted in endoscopy.   IMPRESSION:  1. Anal papilla and internal hemorrhoids, diminutive rectal polyp      status post cold biopsy, otherwise normal rectum.  2. Left-sided diverticula (sigmoid) pedunculated polyps mid descending      colon, hot  snared. Segmental biopsies of the sigmoid and ascending      colon were taken to rule out microscopic colitis, otherwise normal      colon, normal terminal ileum, stool sample obtained. I suspect the      patient's rectal bleeding is coming more from her anorectum.   RECOMMENDATIONS:  1. No aspirin or arthritis medications for 10 days.  2. 10-day course of mesalamine 1 gram suppositories one per rectum at      bedtime.  Diverticulosis and polyp literature provided to Ms.      Fahrner.  3. Follow-up on path. As far as her right upper quadrant abdominal      pain is concerned, we may now need to consider evaluation of this      lady for potential type 3 sphincter of Oddi dysfunction. The next      step would be referral over to [facility] for  further evaluation.      Jonathon Bellows, M.D.  Electronically Signed     RMR/MEDQ  D:  02/21/2007  T:  02/22/2007  Job:  [id number]   cc:   Patrica Duel, M.D.  Fax: [phone]

## 2010-10-17 NOTE — Discharge Summary (Signed)
[name]             ACCOUNT NO.:  [id number]   MEDICAL RECORD NO.:  [id number]          PATIENT TYPE:  INP   LOCATION:  2631                         FACILITY:  [facility]   PHYSICIAN:  Madaline Savage, MD        DATE OF BIRTH:  03-13-1961   DATE OF ADMISSION:  07/06/2007  DATE OF DISCHARGE:  07/07/2007                               DISCHARGE SUMMARY   PRIMARY CARE PHYSICIAN:  Patrica Duel, M.D.  This patient is unassigned  to [place].   DISCHARGE DIAGNOSES:  1. Alcohol intoxication.  2. Thrombocytosis.  3. Hypokalemia.  4. Anxiety disorder.  5. Alcohol abuse versus dependence.   DISCHARGE MEDICATIONS:  She will continue all of her medications as  before which includes:  1. Cymbalta 30 mg twice daily.  2. Xanax 0.5 mg twice daily.   HISTORY OF PRESENT ILLNESS:  For a full history and physical, see the  history and physical dictated by Thomasenia Bottoms, M.D. on July 06, 2007.  In short, the patient is a 51 year old lady who apparently  took 26 tequila shots with her friends and also smoked some marijuana.  She states she is not drunk or taken alcohol for a while and she was  found unresponsive.  Apparently when the EMS came they did not get a  pulse.  They do not have a strip from the EMS monitor.  She apparently  did get some Narcan and she woke up with that and she was brought to the  emergency room.  Apparently in the emergency room, she initially said  she was suicidal, but she denied it later.  She was admitted for further  evaluation and management.   PROBLEM LIST:  1. Alcohol intoxication.  The patient was intoxicated with alcohol      with a high alcohol level on admission of 150.  The next morning      her alcohol level had come down and she was back at her baseline.  2. Questionable cardiac arrest.  When the patient was found by EMS she      apparently did not have a pulse, but she did improve with the      Narcan.  She did not have any chest pain.  She  was monitored in the      stepdown unit under a cardiac monitor and she did not have any      arrhythmias.  She had serial cardiac markers which were all      negative.  I do not think she did have a cardiac arrest.  No      further workup is indicated at this time.  3. Psychiatric illness.  The patient had apparently said that she was      suicidal when she came to the ER, but she later denied being      suicidal.  We consulted psychiatry who diagnosed her with anxiety      disorder.  At this time she is not a harm to herself and she is      deemed safe to be discharged as per  psychiatry.  She is being      discharged home in stable condition.  4. Thrombocytosis.  She did have elevated platelets at 500,000.  The      patient does not want [place] to further investigate her thrombocytosis      at this time.   DISPOSITION:  She is now being discharged home in stable condition.   FOLLOWUP:  She is asked to follow up with her family doctor, Patrica Duel, M.D., in one week.      Madaline Savage, MD  Electronically Signed     PKN/MEDQ  D:  07/07/2007  T:  07/08/2007  Job:  [id number]

## 2010-10-17 NOTE — Assessment & Plan Note (Signed)
NAME:  [name], [name]              CHART#:  [id number]   DATE:  02/11/2007                       DOB:  [birth year]   CHIEF COMPLAINT:  Nausea, vomiting and diarrhea, abdominal pain.   SUBJECTIVE:  [name] is here for an urgent visit. She has a history of  right upper quadrant abdominal pain with nausea and vomiting of 1-2  months duration. She also has an 8 month history of diarrhea, having  upwards to 10 stools daily. She has had multiple studies done thus far  including a CT of the abdomen and pelvis which revealed a fatty liver, a  HIDA scan which was normal and abdominal ultrasound confirmed fatty  liver and an EUS to further evaluate her abdominal pain. Previously, she  had a slightly dilated common bile duct on diameter. Her EUS was normal.  She called yesterday stating that she had been vomiting 5-6 times a day  for a couple of days. She was having too numerous to count number of  pure watery stools. She was very upset. She was advised to go to the ER  for fluids and pain control. After hydration, antiemetics, pain  relievers she felt better and was sent home. She is here today for an  urgent visit. In the emergency department, her white count was 10,900.  Her hemoglobin was 13.1, platelets were 434,000. Her neutrophil count  was 49%, lymphocyte count was 40%. Previously, she had had a  lymphocytosis and thrombocytosis on multiple CBCs. Her urinalysis was  unremarkable. Her potassium was low at 3, creatinine was normal at 0.59.   The patient denies heartburn as long as she is able to take her  omeprazole. Denies any blood in the stool. She continues to have post  prandial right upper quadrant abdominal pain which goes down the right  side of her abdomen. She denies any dysuria, hematuria.   The patient states that she has had significant weight gain in the past  six months. She states that she has gained 30 pounds. She used to weigh  138 pounds. She also states that back in  August before she went to the  emergency department that she had had some bright red blood in her  emesis. This was after multiple episodes of vomiting.   CURRENT MEDICATIONS:  1. Premarin 1.25 mg daily.  2. Cymbalta 60 mg daily.  3. Omeprazole 20 mg b.i.d.  4. Xanax 1 mg nightly p.r.n.  5. Metoclopramide 5 mg q AC and nightly.  6. Percocet 10/650 mg b.i.d.  7. Potassium given to her yesterday but she has not filled.   ALLERGIES:  CODEINE.   PHYSICAL EXAMINATION:  Weight is 169 pounds, stable. Temperature 97.6,  blood pressure 126/80. Pulse 88.  GENERAL: Pleasant, well-nourished, well-developed Caucasian female in no  acute distress.  SKIN: Warm and dry. No jaundice.  ABDOMEN: Soft, nondistended. She has mild right upper quadrant  tenderness and tenderness to the right mid abdomen to deep palpation. No  organomegaly or masses. No rebound, tenderness or guarding. No abdominal  bruits or hernias.  LOWER EXTREMITIES: No edema.  CHEST: LUNGS:  Clear to auscultation.  CARDIAC: Regular rate and rhythm. No murmurs, rubs or gallops.   IMPRESSION:  The patient is a 50 year old lady with an 8 month history  of persistent diarrhea, and 1-2 month history of post  prandial right  upper quadrant abdominal pain associated with nausea and vomiting.  Recent workup has been unrevealing except for thrombocytosis and  lymphocytosis of unclear significance or etiology. Notably, last night  in the ED her WBC was unremarkable except for slight elevation of white  count of 10,900, the high end of normal at 10,500. Her platelet count  was mildly elevated at 434,000. Her neutrophil lymphocyte counts were  normal. It is not clear the etiology of her ongoing symptoms. Due to  chronic diarrhea, we need to consider possibility of IBD, although there  was no bowel findings on recent CT. Her gallbladder appears to be normal  on multiple studies, although her symptoms seem to be biliary in  etiology. She  denies any risk factors suggestive of infectious colitis,  but we will exclude this given the worsening symptoms. Gastroesophageal  reflux disease symptoms seem to be well-controlled. Will screen for  metabolic etiology for her ongoing symptoms at this point.   PLAN:  1. Upper GI with small bowel follow through.  2. LFTs and TSH, sed rate.  3. Stool for ova and parasite, culture for C-difficile and WBCs.  4. She will continue her omeprazole 20 mg b.i.d.  5. I would like for her to try Zofran ODT 4 mg p.o. t.i.d. for nausea      and vomiting as needed. If she cannot have this filled because of      cost, then she will fill the Phenergan 25 mg to take one p.o. q4-6      hours p.r.n. nausea and vomiting #20 with 0 refills.  6. Refilled her Percocet 10/650 mg #20 one p.o. b.i.d. p.r.n. She will      get further refills from Dr.  Nobie Putnam as he has been out of town.  7. She will keep [place] informed if she is not able to keep down liquids.      Otherwise, will followup with her after her test results return.  8. She will keep her office visit with Dr.  Jena Gauss as planned.      Tana Coast, P.A.  Electronically Signed     R. Roetta Sessions, M.D.  Electronically Signed   LL/MEDQ  D:  02/11/2007  T:  02/11/2007  Job:  [id number]   cc:   Patrica Duel, M.D.

## 2010-10-17 NOTE — Consult Note (Signed)
[name], [name]             ACCOUNT NO.:  [id number]   MEDICAL RECORD NO.:  [id number]          PATIENT TYPE:  INP   LOCATION:  2631                         FACILITY:  [facility]   PHYSICIAN:  Antonietta Breach, M.D.  DATE OF BIRTH:  December 16, 1960   DATE OF CONSULTATION:  07/07/2007  DATE OF DISCHARGE:                                 CONSULTATION   REQUESTING PHYSICIAN:  [facility].   REASON FOR CONSULTATION:  Suicidal ideation, depression, alcohol.   HISTORY OF PRESENT ILLNESS:  [name] is a 50 year old female  admitted to [place] on July 06, 2007, after  narcotic abuse, alcohol intoxication and altered mental status.   The patient states that she rarely drinks alcohol and was at a tequila  party and she drank several shots before she knew it and began to feel  very imbibed.  She said that she only wanted to drink some [facility]  and some pizza and at that point, she took one taste of pizza and the  next thing she knew, she woke up with an Ambu bag over her face and  several health care providers around her.   The patient states that at that point, one of the nurses asked her if  she needed anything and the patient stated that she needed her momma,  which evidently implied that she was going to kill herself and go to  heaven.   The patient clearly has constructive future goals.  She has intact  interests. She does not have any suicidal thoughts.  She has no thoughts  of harming others.  She has no hallucinations or delusions.  She is  socially appropriate and cooperative.   Her interest level is at about a 70%.  Her energy is still decreased.  Her concentration is mildly decreased.  She has been on Cymbalta 30 mg  b.i.d. for antidepression, which has been effective recently.   PAST PSYCHIATRIC HISTORY:  The patient does have a history of depression  which is confirmed in the past medical record on review in September  2004.   Also in  March 2007, the patient was listed on Xanax and Cymbalta.  She  does have feeling on edge, excessive worry and muscle tension for which  she has been treated with Xanax.   FAMILY PSYCHIATRIC HISTORY:  None known.   SOCIAL HISTORY:  The patient is single.  She lives with her father.  She  has one daughter.  Religion:  [employer].  She rarely drinks alcohol,  according to her.  She has no history of illegal drugs.   PAST MEDICAL HISTORY:  1. Thrombocytosis.  2. Chronic pain syndrome.   ALLERGIES:  CODEINE.   MEDICATIONS:  The MAR is reviewed.  Patient is not on any current  psychotropic medications.   LABORATORY DATA:  Sodium 142, BUN 3, creatinine 0.53.  The wbc is 11.3,  hemoglobin 11.6, platelet count 367.   HCG negative.  Alcohol was 150 on admission.   SGOT 39, SGPT 27.   REVIEW OF SYSTEMS:  CONSTITUTIONAL, HEAD, EYES, EARS, NOSE, THROAT,  MOUTH,  NEUROLOGIC, PSYCHIATRIC, CARDIOVASCULAR, RESPIRATORY,  GASTROINTESTINAL, GENITOURINARY, SKIN, ENDOCRINE, METABOLIC,  MUSCULOSKELETAL, HEMATOLOGIC, LYMPHATIC:  All unremarkable except for  the fact that the patient does have an anemia as reflected in her  hemoglobin of 11.6.  She still has a slight leukocytosis.   PHYSICAL EXAMINATION:  VITAL SIGNS:  Temperature 97.3, pulse 81,  respiratory rate 18, blood pressure 106/68, O2 saturation on room air  100%.  GENERAL APPEARANCE:  [name] is a middle-age female lying in the  supine position in her hospital bed with no abnormal involuntary  movements.  She is socially appropriate and cooperative.   OTHER MENTAL STATUS EXAM:  [name] is alert.  She has good eye  contact.  Attention span is within normal limits.  Concentration within  normal limits.  Affect slightly anxious.  Mood slightly anxious.  She is  oriented completely to all spheres.  Memory is intact to immediate,  recent and remote except for the alcohol intoxication blackout.  Fund of  knowledge and intelligence within  normal limits.  Speech involves normal  rate and prosody without dysarthria.  Thought process is logical,  coherent, goal directed, no looseness of associates.  Language,  expression and comprehension are intact.  Abstraction is intact.   Thought content:  No thoughts of harming herself.  No thoughts of  harming others.  No delusions, no hallucinations.  Insight is partial.  Judgment is intact.   ASSESSMENT:  AXIS I:  1. 293.84 anxiety disorder, not otherwise specified.  2. Alcohol intoxication, now resolved.  3. Alcohol abuse versus dependence.  4. 293.83 mood disorder, not otherwise specified, depressed, in      partial remission (general medical and functional elements).  AXIS II:  Deferred.  AXIS III:  See general medical section.  AXIS IV:  General medical, primary support group.  AXIS V:  55.   [name] is no longer at risk to harm herself or others.  She agrees  to call emergency services immediately for thoughts of harming herself  or other psychiatric emergency symptoms.   The patient declines any form of inpatient treatment.  She wants to  leave the hospital.  However, she is motivated for outpatient care.  She  is no longer committable now that she is recovered from her acute mental  status changes.   RECOMMENDATIONS:  As mentioned above,  1. The patient does want to be reinstated in outpatient therapy.  She      is interested in following up at the prior psychiatric clinic that      she attended,  [facility] in      [place], [place].  However, other options include the      psychiatric clinics attached to [facility]      [place] or [facility].  2. The patient would be a candidate for a chemical dependency      intensive outpatient program if she can arrange the transportation.      Those are available at the above hospitals.  3. Would continue the patient on her Cymbalta 30 mg b.i.d. for       antidepression, anti-anxiety in the context of chronic pain.      Antonietta Breach, M.D.  Electronically Signed     JW/MEDQ  D:  07/07/2007  T:  07/07/2007  Job:  [id number]

## 2010-10-17 NOTE — Assessment & Plan Note (Signed)
NAMEMarland Kitchen  PAITEN, BOIES              CHART#:  [id number]   DATE:  01/30/2007                       DOB:  1960/07/17   PRIMARY CARE PHYSICIAN:  Dr. Patrica Duel, M.D.   CHIEF COMPLAINT:  Abdominal pain.   SUBJECTIVE:  Ms. Gilchrest is a 50 year old female who was previously a  patient of  Dr. Karilyn Cota.  She is choosing to change to Dr. Jena Gauss in his  absence.  She was last seen back in March, 2007, when she underwent EGD  for nausea, vomiting, abdominal pain, and exam was normal.  More  recently she developed a severe right upper quadrant aching pain which  radiates to her mid-back.  She describes the pain as squeezing and at  times sharp.  It has been persistent for at least a month.  She has  constant pain and intermittent worsening of the pain.  The pain is worse  postprandially.  She tells me, Everything I eat or drink causes pain.  She tells me she is not sleeping at night because of the severe pain.  She is on omeprazole 20 mg b.i.d.  She did run out.  She has noticed  some heartburn and indigestion.  She states, however, this is resolved  when she is taking the medicine, and she has been on it for about a year  now.  She is also on Reglan 5 mg a.c. and nightly.  She started on this  recently through Dr. Geanie Logan office.  She has noticed dark stools.  She is having anywhere from 2 to 8 stools per day.  She denies any  rectal bleeding, denies any mucus in her stools.  She does have chills  and diaphoresis but denies any fever.  During her episode of nausea,  vomiting and diarrhea last year, she was slated to have a colonoscopy  due to hematochezia.  However, it was felt  she was unable to keep down  the prep.  Therefore, this exam was postponed.  She had an abdominal  ultrasound on December 31, 2006, which showed fatty infiltration of the  liver, Murphy's point tenderness without evidence of acute  cholecystitis.  She has a slightly prominent CBD at 7.6 mm.  She then  had a Hida scan on  January 06, 2007, which was normal.  She had normal  LFTs on December 27, 2006.  She had a normal Met 7.  She had a CBC which  showed thrombocytosis with a platelet count of 580.  She had a white  blood cell count of 10.5, hemoglobin of 13.5, and hematocrit 40.  Her  amylase and lipase were normal.   PAST MEDICAL/SURGICAL HISTORY:  Hospitalization as described in HPI for  nausea, vomiting and diarrhea with EGD as described in HPI.  Migraine  headaches, chronic low back pain due to lumbar spondylosis and  degenerative disc disease, herpes zoster, depression, a complete  hysterectomy over the course of 4 surgeries.  Benign breast lumpectomy,  right knee arthroscopy, nasal septoplasty, GERD.   CURRENT MEDICATIONS:  1. Premarin 1.25 mg daily.  2. Cymbalta 60 mg daily.  3. Omeprazole 20 mg b.i.d.  4. Alprazolam 1 mg nightly and p.r.n.  5. Reglan 5 mg a.c. and nightly.  6. Percocet 10/650 mg b.i.d.   ALLERGIES:  CODEINE causes nausea.   FAMILY HISTORY:  She is  adopted, but she does know of a history of  biological brother with pancreatic carcinoma at age 69.   SOCIAL HISTORY:  Ms. Standish is single.  She lives with her father.  She  is disabled.  She has a 32-pack-year history of tobacco use.  Denies  alcohol or drug use.   REVIEW OF SYSTEMS:  See HPI.  Otherwise negative.   PHYSICAL EXAM:  VITAL SIGNS:  Weight 169 pounds, height 66 inches, temp  98.3, blood pressure 130/82, pulse 76.  GENERAL:  Ms. Genson is a well-developed, well-nourished Caucasian  female in no acute distress.  HEENT:  Sclera clear.  Oropharynx pink and moist without lesions.  CHEST:  Heart regular rate and rhythm, normal S1, S2 without murmurs,  rubs, thrills or gallops.  Lungs clear to auscultation bilaterally.  ABDOMEN:  Positive bowel sounds x4, no bruits auscultated.  Soft,  nontender, nondistended without palpable mass or hepatosplenomegaly.  EXTREMITIES:  Without clubbing or edema bilaterally.  SKIN:  Pink,  warm and dry with no rash or jaundice.   IMPRESSION:  Ms. Wiggs is a 50 year old female with a 63-month history  of persistent postprandial right upper quadrant pain.  She had a normal  HIDA scan.  Abdominal ultrasound shows a slightly prominent common bile  duct of 7.6 mm, and she has normal LFTs.  Do feel she needs further  evaluation of her common bile duct, and at this point we will proceed  with CT scan of abdomen and pelvis to look for evidence of mass,  stricture, etc.  It is possible that she may have passed a stone.  However, again, there was no bump in her transaminases.   Her thrombocytosis is concerning, and we will recheck this and may refer  for further evaluation.   PLAN:  1. CT scan abd/pelvis with IV and oral contrast with further      recommendations to follow.  2. Check CBC today.  3. Hx of hematochezia previously may require colonoscopy for further      evaluation in the future.       Lorenza Burton, N.P.  Electronically Signed     R. Roetta Sessions, M.D.  Electronically Signed    KJ/MEDQ  D:  01/30/2007  T:  01/31/2007  Job:  [id number]   cc:   R. Roetta Sessions, M.D.  Patrica Duel, M.D.

## 2010-10-20 NOTE — Consult Note (Signed)
[name], [name]             ACCOUNT NO.:  [id number]   MEDICAL RECORD NO.:  [id number]          PATIENT TYPE:  INP   LOCATION:  A202                          FACILITY:  [facility]   PHYSICIAN:  Lionel December, M.D.    DATE OF BIRTH:  [birth year]   DATE OF CONSULTATION:  08/07/2005  DATE OF DISCHARGE:                                   CONSULTATION   GASTROENTEROLOGY CONSULTATION   REASON FOR CONSULTATION:  Nausea and vomiting.   HISTORY OF PRESENT ILLNESS:  [name] is a 50 year old Caucasian female with  history of chronic back pain, depression, chronic gastroesophageal reflux  disease who was admitted five days ago with a several day history of nausea,  vomiting, vague upper abdominal pain and diarrhea.  Initially she had a mild  leukocytosis at 13,300 without left shift.  On August 04, 2005 her white count  was down to 8800 consisting mostly of lymphocytes at 49%.  Hemoglobin 11.3.  Her liver function tests were normal except for albumin at 2.9. Clostridium  difficile and O and P were negative.  Stool Hemoccult's were negative X2.  Abdominal ultrasound revealed fatty infiltration of the liver.  CT scan was  also unremarkable except for fatty liver.  She continues to have  postprandial nausea and vomiting.  No hematemesis.  She says she has had two  to three month history of postprandial diarrhea, usually consisting of  greater than 5 stools per day.  Stools are nonbloody and no melena. She  complains of vague upper abdominal pain which is worsened with meals.  She  has chronic acid reflux and takes Tums.  She denies any current dysphagia,  odynophagia although states that she always gets oral Candidiasis after  antibiotics.  About 3 months ago she took a Z-Pak and had thrush and also  felt that she had yeast of her esophagus based on odynophagia and dysphagia.  This has never been confirmed, however.  She had an upper endoscopy in the  remote past over 10 to 15 years ago.  She had her  esophagus stretched at  that time.  She denies any recent antibiotic use.  She denies any ill  contacts.   MEDICATIONS AT HOME:  Cymbalta, Premarin, Xanax 1 mg t.i.d., Percocet PRN.   ALLERGIES:  CODEINE.   PAST MEDICAL HISTORY:  Migraine headaches, chronic low back pain due to  lumbar spondylosis and degenerative disc disease.  History of Shingles a few  months ago in the right upper quadrant region with minimal residual pain,  depression.   PAST SURGICAL HISTORY:  Hysterectomy which she reports to be complete; this  was over a course of four surgeries. She has had a benign lump removed from  her breast.  She had right knee arthroscopy for ligament tendon repair.  Nasal septoplasty.   FAMILY HISTORY:  She is adopted but reports family history of diabetes,  hypertension, heart disease and her biological mother who had emphysema.   SOCIAL HISTORY:  She is single. She has one daughter.  She is currently out  on disability due to her back.  She smokes 2  packs a day and has smoked  since age 36.  Denies any alcohol use.  In her youth she did do intranasal  drug use.  No IV drug use.   REVIEW OF SYSTEMS:  GASTROENTEROLOGY:  See history of present illness for  gastrointestinal.  CARDIOPULMONARY:  Denies any chest pain or shortness of  breath.  GENITOURINARY:  Denies any dysuria.   PHYSICAL EXAMINATION:  VITAL SIGNS:  Temperature 98, pulse 85, respirations  20, blood pressure 105/60. Height 5 feet 6 inches, weight 148.  GENERAL APPEARANCE:  Pleasant, well-nourished, well-developed Caucasian  female in no acute distress.  SKIN:  Warm and dry, no jaundice.  HEENT:  Conjunctiva are pink.  Sclerae nonicteric.  Oropharyngeal mucosa  moist and pink.  No lesions, erythema or exudates.  NECK:  No lymphadenopathy or thyromegaly.  CHEST:  Lungs clear to auscultation.  CARDIAC:  Examination reveals regular rate and rhythm with normal S1 and S2.  No murmurs, rubs or gallops.  ABDOMEN:  Positive  bowel sounds.  Soft, nondistended.  She has diffuse  moderate tenderness throughout her abdomen with some localization in the  epigastrium. No rebound tenderness or guarding.  No abdominal bruits or  hernias.  EXTREMITIES:  No edema.  Right upper extremity reveals some erythema  proximal to her prior intravenous site.  No warmth.  Some tenderness.   LABORATORY DATA:  From August 04, 2005 revealed white count 8800, hemoglobin  11.3, hematocrit 33.4, MCV 91.8, platelet count 470,000, neutrophils 42%,  lymphocytes 49%, sodium 144, potassium 4.5, BUN 4, creatinine 0.6, glucose  88, total bilirubin 0.5, alkaline phosphatase 69, AST 25, ALT 17, albumin  2.9, amylase 61, lipase 23.   IMPRESSION:  Patient is a 50 year old Caucasian female with approximately  one week history of postprandial nausea, vomiting, vague upper abdominal  pain and two to three month history of postprandial nonbloody diarrhea.  CT  scan and abdominal ultrasound have been unremarkable.  Initially she had  leukocytosis and increased lymphocytes suggestive of viral etiology.  Clostridium difficile and O and P have been negative.  She does have chronic  gastroesophageal reflux disease  which is poorly controlled. She may have an  underlying viral illness, although this appears to be somewhat protracted at  this point.  Cannot exclude the possibility of peptic ulcer disease or  erosive reflux esophagitis.  I do not suspect that she has a biliary  etiology at this point based on her symptoms.  She probably has underlying  irritable bowel syndrome which explains her postprandial loose stools as  well as loose stools related to stress.  She has right upper extremity  erythema proximal to her prior intravenous site, possibly phlebitis related  to intravenous for Phenergan.   RECOMMENDATIONS:  1.  Will follow up HIDA results.  2.  CBC and liver function tests. 3.  Follow up stool cultures.  4.  If HIDA scan is negative will  proceed with upper endoscopy as next step.  5.  Continue proton pump inhibitor therapy.  6.  Consider discontinuing intravenous Phenergan.   I would like to thank Dr. Patrica Duel for allowing [place] to take part in the  care of this patient.      Tana Coast, P.A.      Lionel December, M.D.  Electronically Signed    LL/MEDQ  D:  08/07/2005  T:  08/07/2005  Job:  [id number]

## 2010-10-20 NOTE — Op Note (Signed)
[place]  Patient:    [name], [name]                    MRN: [id number] Proc. Date: 12/04/99 Adm. Date:  [id number] Attending:  Serena Colonel H CC:         John Giovanni, M.D.                           Operative Report  PREOPERATIVE DIAGNOSES:  Septal deviation and turbinate hypertrophy.  POSTOPERATIVE DIAGNOSES:  Septal deviation and turbinate hypertrophy.  PROCEDURE: 1. Nasal septoplasty. 2. Submucous resection, inferior turbinates.  SURGEON:  Jefry H. Pollyann Kennedy, M.D.  ANESTHESIA:  General endotracheal anesthesia was used.  COMPLICATIONS:  None.  REFERRING PHYSICIAN:  John Giovanni, M.D.  FINDINGS:  Elongation and thickening of the right inferior turbinate; elongation and hypertrophic mucosa of the left inferior turbinate.  Severe septal deviation with what appeared to be a fractured nasal septum, with the superior aspect deviated towards the right side and the overlying nasal dorsum and tip also deflected towards the right side; the inferior septal attachment anteriorly was severely deflected to the left side and almost touching the inferior turbinate.  Patient tolerated the procedure well, was awakened, extubated and transferred to recovery in stable condition.  ESTIMATED BLOOD LOSS:  25 cc.  HISTORY:  [age]-year-old lady with a long history of chronic left-sided nasal obstruction.  She has a history of nasal trauma in the past.  Risks, benefits, alternatives and complications of the procedure were explained to the patient, who seemed to understand and agreed to surgery.  DESCRIPTION OF PROCEDURE:  Patient was taken to the operating room and placed on the operating room in the supine position.  Following induction of general endotracheal anesthesia, the patient was prepped and draped in a standard fashion.  Oxymetazoline spray was used in the nose preoperatively.  Xylocaine 1% with epinephrine was injected into the  septum, the columella and the inferior turbinates bilaterally; a total of about 3 cc were used. Oxymetazoline-soaked pledgets were then packed in the nasal cavities bilaterally.  A left hemitransfixion incision was used to approach the septal cartilage.  Mucoperichondrial flap was elevated posteriorly back to the sphenoid rostrum.  There was a severe deviation about halfway back and great care was taken to preserve as much mucosa as possible.  There were some tears that occurred on the left side, but on the contralateral flap, there were none.  The bony-cartilaginous junction was divided and a flap was developed posteriorly down the right side.  At the superior attachment of the nasal septum, the ethmoid perpendicular plate was taken down using open Jansen-Middleton rongeurs.  The majority of the ethmoid plate was then resected inferiorly and posteriorly, detached from the sphenoid attachment. This maneuver allowed at least the lower part of the nasal dorsum to become mobile and to lie nicely in the midline.  The inferior attachment of the ethmoid plate to the vomerine bone was taken down as well as part of the vomerine bone that was deflected off to the left side.  A curved chisel was used to remove the severe vomerine and maxillary crest deflection that was impinging on the left inferior airway; this provided nice significant improvement of the airways bilaterally.  The quadrangular cartilage was stable and sturdy and was left untouched.  The mucosal incision was reapproximated with 4-0 chromic suture.  A quilting suture of 4-0 plain gut was  used to reappose septal flaps.  #2 - Submucous resection, inferior turbinates:  The leading edge of the inferior turbinates was incised with a 15 scalpel.  Mucosa was elevated off the bone inferiorly, laterally and medially, bilaterally.  Large fragments of bone were removed from the right side.  Small fragments were removed from the left side.  An  Rickard Patience was then used to out-fracture the turbinates bilaterally.  Airways were nicely opened inferiorly after having performed this.  The nasal cavities were suctioned of blood and secretions and packed with rolled up Telfa gauze coated with Bacitracin ointment.  The pharynx was suctioned of blood and secretions and the patient was awakened, extubated and transferred to recovery. DD:  12/04/99 TD:  12/04/99 Job: [id number]

## 2010-10-20 NOTE — Discharge Summary (Signed)
[name], [name]             ACCOUNT NO.:  [id number]   MEDICAL RECORD NO.:  [id number]          PATIENT TYPE:  INP   LOCATION:  A202                          FACILITY:  [facility]   PHYSICIAN:  Patrica Duel, M.D.    DATE OF BIRTH:  11-16-1960   DATE OF ADMISSION:  08/03/2005  DATE OF DISCHARGE:  03/08/2007LH                                 DISCHARGE SUMMARY   DISCHARGE DIAGNOSES:  1.  Severe abdominal pain with nausea and vomiting of questionable etiology.      Extensive workup negative.  2.  Chronic pain syndrome.  Multiple diagnostics unrevealing.  Chronic      narcotic use stable.  3.  Longstanding anxiety and depression.  4.  Status post knee surgery.   HISTORY:  For details regarding admission please refer to the admitting  note.  Briefly this 50 year old female with the above history presented to  the office the day of admission with a 3-day history of severe right upper  quadrant abdominal and epigastric abdominal pain associated with nausea,  vomiting and some diarrhea.  She was sent to the emergency department for  evaluation and found to have a mild leukocytosis with a white count of  13,000 associated with a normal differential.  Her H&H were normal,  chemistries including liver functions, etc., were benign, amylase and lipase  normal.  CT scan was benign except for fatty infiltration of the liver.  Pelvic CT negative.  She was admitted with above symptom complex.   COURSE IN THE HOSPITAL:  The patient was hydrated and treated  symptomatically.  She did improve.  Gastroenterology was consulted.  A HIDA  scan was obtained which was normal.  Stool studies, etc., were normal.  She  underwent upper endoscopy which was unrevealing as well.  Stool guaiac's  negative x3.  The patient's symptoms did improve and by the sixth hospital  day she was stable for discharge.  Ultimate etiology of her symptoms not  determined though supratentorial component likely.   DISPOSITION:   Medications include Cymbalta, Premarin, Xanax, and Percocet  p.r.n.  She will also use Bentyl p.r.n.  She will be followed and treated  expectantly as an outpatient.  Gastroenterology suggests a possible  endoscopic ultrasound pending her progress.  We will follow and treat  expectantly.      Patrica Duel, M.D.  Electronically Signed     MC/MEDQ  D:  08/30/2005  T:  09/01/2005  Job:  [id number]

## 2010-10-20 NOTE — H&P (Signed)
NAME:  [name], [name]                       ACCOUNT NO.:  [id number]   MEDICAL RECORD NO.:  [id number]                   PATIENT TYPE:  INP   LOCATION:  3012                                 FACILITY:  [facility]   PHYSICIAN:  Payton Doughty, M.D.                   DATE OF BIRTH:  06-19-1960   DATE OF ADMISSION:  02/11/2003  DATE OF DISCHARGE:  02/11/2003                                HISTORY & PHYSICAL   ADMITTING DIAGNOSES:  1. Lumbar spondylosis and degenerative disk disease.  2. Drug-seeking.  3. Malingering.   SERVICE:  Neurosurgery.   BODY OF TEXT:  This is a 50 year old right-handed white female.  I saw her  in the office for back pain several years ago.  She showed up in the [facility] Emergency Room last night with low back pain.  Apparently, she had been  seen a couple of times with back pain.  MRI just done three weeks ago showed  mild degenerative change at 4-5 and 5-1.  There was some mild change last  night on the MRI.  She received 13 mg of Dilaudid in the emergency room and  said she was unrelieved and was admitted.  She was transferred by ambulance  this morning.  She was given 4 mg of Dilaudid, said she still had back pain  and was very abusive to the staff.  She has been able to move all four  extremities and at no time during any of this was she found to have a  neurological deficit.   MEDICAL HISTORY:  Depression.   SURGICAL HISTORY:  1. Hysterectomy.  2. Breast lump.  3. Right knee.   ALLERGIES:  Allergies are to CODEINE.   MEDICATIONS:  Medications are Percocet, Valium, Ambien and Effexor.   SOCIAL HISTORY:  She smokes, does not drink, is not working and is on  [employer]; she used to work here as a Pharmacologist.   PHYSICAL EXAMINATION:  GENERAL:  On exam, she is awake with low back and  right leg pain, occasional pain into her left leg.  HEENT:  Exam is within normal limits.  NECK:  Her neck is without lymphadenopathy.  CHEST:  Chest clear.  CARDIAC:  Regular rate and rhythm.  ABDOMEN:  Abdomen soft and nontender with no hepatosplenomegaly.  EXTREMITIES:  Extremities without clubbing or cyanosis.  GU:  Exam is deferred.  PERIPHERAL PULSES:  Good.  NEUROLOGIC:  Neurologically, she is awake, alert and oriented x3.  Her  cranial nerves are intact.  Motor exam shows 5/5 strength throughout the  upper and lower extremities, although she is quite hesitant with the right  leg and drops it spontaneously when asked to push up with it.  There is no  current sensory deficit.  Deep tendon reflexes are 2 at the knees and 2 at  the ankles.  Toes are downgoing bilaterally.  Straight leg  raise is  negative.   IMAGING STUDIES:  MRI shows mild degenerative disk disease at L4-5 and L5-S1  and question slight increased disk in severity versus several weeks ago.   ASSESSMENT:  1. Mild degenerative disk disease.  2. Drug-seeking behavior.  3. Malingering.   Her complaint are vastly out of proportion to the pathology on the magnetic  resonance imaging and completely incongruous with her exam.  She does not  need to be hospitalized, nor does she need an operation.  She is going to be  discharged home with a Medrol Dosepak for whatever inflammation exists at  the degenerative disk and a small amount of Percocet.  Her followup will be  with her family doctor on an as-needed (p.r.n.) basis in my office.                                                Payton Doughty, M.D.    MWR/MEDQ  D:  02/11/2003  T:  02/12/2003  Job:  [id number]

## 2010-10-20 NOTE — Op Note (Signed)
NAMEJAYCI, [name]             ACCOUNT NO.:  [id number]   MEDICAL RECORD NO.:  [id number]          PATIENT TYPE:  INP   LOCATION:  A202                          FACILITY:  [facility]   PHYSICIAN:  Lionel December, M.D.    DATE OF BIRTH:  [birth year]   DATE OF PROCEDURE:  08/08/2005  DATE OF DISCHARGE:                                 OPERATIVE REPORT   PROCEDURE:  Esophagogastroduodenoscopy.   INDICATIONS:  [name] is a 50 year old Caucasian female who was admitted  five days ago with nausea, vomiting, abdominal pain and diarrhea. She is  felt to have acute gastroenteritis, but her symptoms have persisted. She had  unremarkable abdominal pelvic CT and ultrasound, and yesterday, she had  hepatobiliary scan, and her EF was 48%. She remains with epigastric pain as  well as nausea and vomiting. She is undergoing diagnostic  esophagogastroduodenoscopy. The patient reports passing fresh blood with her  bowel movements yesterday. This may well be secondary hemorrhoids and can be  evaluated at a later date when she is able to tolerate the prep.   Procedure and risks were reviewed with the patient, and informed consent was  obtained.   MEDICATIONS FOR CONSCIOUS SEDATION:  Cetacaine spray for pharyngeal topical  anesthesia, Demerol 50 mg IV, Versed 10 mg IV.   FINDINGS:  Procedure performed in endoscopy suite. The patient's vital signs  and O2 saturation were monitored during the procedure and remained stable.   The patient was placed in left lateral position, and Olympus videoscope was  passed via oropharynx without any difficulty into esophagus.   Esophagus. Mucosa of the esophagus normal throughout. GE junction was at 38  cm from the incisors and was unremarkable.   Stomach. It was empty and distended very well with insufflation. Folds of  proximal stomach were normal. Examination of mucosa at body, antrum, pyloric  channel as well as angularis, fundus and cardia was normal.   Duodenum.  Bulbar mucosa was normal. Scope was passed into the second part of  the duodenum where mucosa and folds were normal. Endoscope was withdrawn.  The patient tolerated the procedure well.   FINAL DIAGNOSIS:  Normal esophagogastroduodenoscopy.   RECOMMENDATIONS:  1.  The trial with dicyclomine 10 mg before each meal.  2.  Start on low-fat diet.  3.  If symptoms persist, she may need further workup starting with an EUS.      Lionel December, M.D.  Electronically Signed     NR/MEDQ  D:  08/08/2005  T:  08/09/2005  Job:  [id number]

## 2010-10-20 NOTE — H&P (Signed)
[name], [name]             ACCOUNT NO.:  [id number]   MEDICAL RECORD NO.:  [id number]          PATIENT TYPE:  INP   LOCATION:  A202                          FACILITY:  [facility]   PHYSICIAN:  [name], M.D.    DATE OF BIRTH:  [birth year]   DATE OF ADMISSION:  08/03/2005  DATE OF DISCHARGE:  LH                                HISTORY & PHYSICAL   CHIEF COMPLAINT:  Nausea, vomiting, abdominal pain.   HISTORY OF PRESENT ILLNESS:  This is a 50 year old female with a history of  severe back pain. Multiple diagnostics have been unrevealing. She was  currently on disability for same and using moderate amounts of narcotics for  control. She also has a history of depression and anxiety and is status post  knee surgery.   Approximately five years ago, the patient nearly had her gallbladder out,  but the surgery was postponed just prior to the procedure. Reasons are  unknown.   The patient presented to the office the day of admission with a three-day  history of severe right upper quadrant and epigastric abdominal pain  associated with nausea and vomiting and some diarrhea. There was no melena,  hematemesis or hematochezia. She also denied fever or chills.   The patient was in severe distress and sent immediately to the emergency  department for evaluation. In the emergency department, she was found to  have mild leukocytosis with a white count of 13,000 associated with a normal  differential. Her H and H were normal. Chemistries including liver  functions, etc., were benign. Amylase and lipase normal. CT scan was  negative except for fatty infiltration of the liver. Pelvic CT negative.   The patient was admitted with severe abdominal pain, nausea, vomiting and  some diarrhea.   There was no history of headache, neurological deficits, chest pain,  shortness of breath, melena, hematemesis, hematochezia or genitourinary  symptoms.   CURRENT MEDICATIONS:  1.  Xanax 1 mg t.i.d.  p.r.n.  2.  Percocet 10/650 one q.i.d. p.r.n.  3.  Premarin 1.25 p.o. daily.  4.  Cymbalta 60 mg daily (well tolerated and effective).   ALLERGIES:  None known.   PAST HISTORY:  As noted above.   FAMILY HISTORY:  Noncontributory.   REVIEW OF SYSTEMS:  Negative except as mentioned.   SOCIAL HISTORY:  The patient does smoke and has a history of moderate  alcohol use only.   PHYSICAL EXAMINATION:  GENERAL:  This is a well-developed, well-nourished  female who is tearful and writhing in pain.  VITAL SIGNS:  At presentation, temperature 97.6, BP 110/56, pulse 103,  respirations 24, O2 saturation 100%.  HEENT:  Normocephalic, atraumatic. Pupils are equal. Ears, nose and throat  are benign.  NECK:  Supple.  LUNGS:  Clear.  HEART:  Sounds normal without murmurs, rubs, or gallops.  ABDOMEN:  Reveals diffuse tenderness particularly in the right upper  quadrant and epigastrium. There is no CVA tenderness noted.  EXTREMITIES:  Revealed no clubbing, cyanosis, or edema.  NEUROLOGICAL:  Within normal limits.   ASSESSMENT:  Severe abdominal pain, nausea, vomiting, diarrhea. Question  cholecystitis though liver functions are normal.   PLAN:  Admit for surgical consult, stat ultrasound and further diagnostics  and intervention pending her progress. Will follow and treat expectantly.      [name], M.D.  Electronically Signed     MC/MEDQ  D:  08/07/2005  T:  08/07/2005  Job:  [id number]

## 2010-10-20 NOTE — H&P (Signed)
NAME:  [name], [name]                       ACCOUNT NO.:  [id number]   MEDICAL RECORD NO.:  [id number]                   PATIENT TYPE:  INP   LOCATION:  A309                                 FACILITY:  [facility]   PHYSICIAN:  Madelin Rear. Sherwood Gambler, M.D.             DATE OF BIRTH:  [birth year]   DATE OF ADMISSION:  02/10/2003  DATE OF DISCHARGE:                                HISTORY & PHYSICAL   CHIEF COMPLAINT:  Back pain.   HISTORY OF PRESENT ILLNESS:  The patient has had recurrent unrelenting back  pain chronically for some time now, but developed an abrupt increase in pain  after a fall today.  Recently, she had documented a herniated nucleus  pulposus L4,5 with nerve compression.  She was pending followup at  neurosurgery with the fall intervening and causing an emergency room  visitation due to severe excruciating pain.  She denies any muscle weakness,  although she has had intermittent legs giving out.  She received several  doses of IV Dilaudid with relief of her discomfort, to the point where she  could at least lie without moaning in pain, however, she is extremely  immobile secondary to any movement exacerbating the pain.   PAST MEDICAL HISTORY:  She has had recurrent back pain, depression.   SOCIAL HISTORY:  Cigarette smoking positive, negative alcohol or drug use.  She states that she lost her job secondary to back causing chronic recurrent  pain.  Formerly employed by [employer].   FAMILY HISTORY:  Noncontributory.   REVIEW OF SYSTEMS:  Review of systems is under HPI, was positive for  occasional neck popping and some numbness and tingling of her ring and  little finger of the left hand.  There is no motor weakness with this.  She  has not had any previous scans done of her C-spine, just her LS-spine  showing the aforementioned results.   PHYSICAL EXAMINATION:  GENERAL:  She appears uncomfortable with the least  movement of the stretcher or the patient  spontaneously moving.  HEAD/NECK:  No JVD, adenopathy.  Neck supple.  CHEST:  Clear.  CARDIAC:  Regular rate and rhythm without murmur, gallop, rub.  ABDOMEN:  Soft, no organomegaly or masses.  EXTREMITIES:  Without clubbing, cyanosis or edema.  NEUROLOGIC:  Hyperreflexia, bilateral knee jerk, normal ankle jerk reflexes.  Straight leg raising is positive bilaterally.  Muscle weakness was difficult  to assess secondary to patient's discomfort on an active assessment.  The  rest of her neurological examination is nonfocal.   IMPRESSIONS:  1. Intractable back pain secondary to herniated nucleus pulposus, L4,5,     probably exacerbated by fall.  Consultation ensued with Dr. Channing Mutters, who     graciously accepted the patient during normal hours, hopefully     tomorrow morning if that is available.  She will maintained on IV     analgesia, antispasm regimen and strict bedrest protocol.  2. Depression, stable at present.                                               Madelin Rear. Sherwood Gambler, M.D.    LJF/MEDQ  D:  02/10/2003  T:  02/10/2003  Job:  [id number]

## 2010-10-20 NOTE — Discharge Summary (Signed)
   NAME:  RIELY, OETKEN                       ACCOUNT NO.:  [id number]   MEDICAL RECORD NO.:  [id number]                   PATIENT TYPE:  INP   LOCATION:  3012                                 FACILITY:  [facility]   PHYSICIAN:  Madelin Rear. Sherwood Gambler, M.D.             DATE OF BIRTH:  Aug 29, 1960   DATE OF ADMISSION:  02/10/2003  DATE OF DISCHARGE:  02/11/2003                                 DISCHARGE SUMMARY   DISCHARGE DIAGNOSES:  1. Intractable back pain.  2. Herniated nucleus pulposus.   DISCHARGE MEDICATIONS:  None.   TRANSFER:  Transfer to [facility], Dr. Channing Mutters, accomplished.   SUMMARY:  The patient was admitted due to intractable back pain requiring  multiple doses of strong IV medication.  Her MRI was reviewed confirming  nucleus pulposus herniation, and she was sent to Dr. Channing Mutters after discussion  and acceptance for definitive care.     ___________________________________________                                         Madelin Rear. Sherwood Gambler, M.D.   LJF/MEDQ  D:  03/01/2003  T:  03/01/2003  Job:  [id number]

## 2011-02-23 ENCOUNTER — Emergency Department (HOSPITAL_COMMUNITY)
Admission: EM | Admit: 2011-02-23 | Discharge: 2011-02-23 | Disposition: A | Discharge status: Home / Self Care | Payer: Medicare Other | Attending: Emergency Medicine | Admitting: Emergency Medicine

## 2011-02-23 ENCOUNTER — Emergency Department (HOSPITAL_COMMUNITY): Payer: Medicare Other

## 2011-02-23 DIAGNOSIS — S20229A Contusion of unspecified back wall of thorax, initial encounter: Secondary | ICD-10-CM | POA: Insufficient documentation

## 2011-02-23 DIAGNOSIS — I10 Essential (primary) hypertension: Secondary | ICD-10-CM | POA: Insufficient documentation

## 2011-02-23 DIAGNOSIS — M5137 Other intervertebral disc degeneration, lumbosacral region: Secondary | ICD-10-CM | POA: Insufficient documentation

## 2011-02-23 DIAGNOSIS — Y92009 Unspecified place in unspecified non-institutional (private) residence as the place of occurrence of the external cause: Secondary | ICD-10-CM | POA: Insufficient documentation

## 2011-02-23 DIAGNOSIS — M51379 Other intervertebral disc degeneration, lumbosacral region without mention of lumbar back pain or lower extremity pain: Secondary | ICD-10-CM | POA: Insufficient documentation

## 2011-02-23 DIAGNOSIS — S300XXA Contusion of lower back and pelvis, initial encounter: Secondary | ICD-10-CM

## 2011-02-23 DIAGNOSIS — W010XXA Fall on same level from slipping, tripping and stumbling without subsequent striking against object, initial encounter: Secondary | ICD-10-CM | POA: Insufficient documentation

## 2011-02-23 DIAGNOSIS — W19XXXA Unspecified fall, initial encounter: Secondary | ICD-10-CM

## 2011-02-23 HISTORY — DX: Essential (primary) hypertension: I10

## 2011-02-23 HISTORY — DX: Anxiety disorder, unspecified: F41.9

## 2011-02-23 HISTORY — DX: Other intervertebral disc degeneration, lumbar region: M51.36

## 2011-02-23 HISTORY — DX: Depression, unspecified: F32.A

## 2011-02-23 HISTORY — DX: Major depressive disorder, single episode, unspecified: F32.9

## 2011-02-23 HISTORY — DX: Other intervertebral disc degeneration, lumbar region without mention of lumbar back pain or lower extremity pain: M51.369

## 2011-02-23 LAB — TROPONIN I: Troponin I: 0.02

## 2011-02-23 LAB — RAPID URINE DRUG SCREEN, HOSP PERFORMED
Amphetamines: NOT DETECTED
Barbiturates: NOT DETECTED
Benzodiazepines: POSITIVE — AB
Cocaine: NOT DETECTED
Opiates: POSITIVE — AB
Tetrahydrocannabinol: POSITIVE — AB

## 2011-02-23 LAB — URINALYSIS, ROUTINE W REFLEX MICROSCOPIC
Bilirubin Urine: NEGATIVE
Glucose, UA: NEGATIVE
Hgb urine dipstick: NEGATIVE
Ketones, ur: NEGATIVE
Nitrite: NEGATIVE
Protein, ur: NEGATIVE
Specific Gravity, Urine: 1.014
Urobilinogen, UA: 0.2
pH: 6

## 2011-02-23 LAB — DIFFERENTIAL
Basophils Absolute: 0.1
Basophils Relative: 1
Eosinophils Absolute: 0.4
Eosinophils Relative: 3
Lymphocytes Relative: 52 — ABNORMAL HIGH
Lymphs Abs: 7.2 — ABNORMAL HIGH
Monocytes Absolute: 0.1
Monocytes Relative: 1 — ABNORMAL LOW
Neutro Abs: 5.8
Neutrophils Relative %: 43

## 2011-02-23 LAB — CARDIAC PANEL(CRET KIN+CKTOT+MB+TROPI)
CK, MB: 0.5
Total CK: 51
Total CK: 54

## 2011-02-23 LAB — CBC
HCT: 34.4 — ABNORMAL LOW
HCT: 39.5
Hemoglobin: 11.6 — ABNORMAL LOW
Hemoglobin: 13.4
MCHC: 33.9
MCV: 90.7
Platelets: 469 — ABNORMAL HIGH
RBC: 4.36
RDW: 14.3
WBC: 11.3 — ABNORMAL HIGH
WBC: 13.6 — ABNORMAL HIGH

## 2011-02-23 LAB — COMPREHENSIVE METABOLIC PANEL WITH GFR
ALT: 27
Albumin: 3.8
Alkaline Phosphatase: 77
BUN: 7
Calcium: 8.9
Potassium: 3.1 — ABNORMAL LOW
Sodium: 143
Total Protein: 6.5

## 2011-02-23 LAB — COMPREHENSIVE METABOLIC PANEL
AST: 39 — ABNORMAL HIGH
CO2: 26
Chloride: 107
Creatinine, Ser: 0.62
GFR calc Af Amer: >60
GFR calc non Af Amer: >60
Glucose, Bld: 110 — ABNORMAL HIGH
Total Bilirubin: 0.5

## 2011-02-23 LAB — BASIC METABOLIC PANEL
GFR calc non Af Amer: >60
Potassium: 3.5
Sodium: 142

## 2011-02-23 LAB — CK TOTAL AND CKMB (NOT AT ARMC)
CK, MB: 0.6
Relative Index: INVALID
Total CK: 43

## 2011-02-23 LAB — SAMPLE TO BLOOD BANK

## 2011-02-23 LAB — PATHOLOGIST SMEAR REVIEW

## 2011-02-23 LAB — ETHANOL: Alcohol, Ethyl (B): 150 — ABNORMAL HIGH

## 2011-02-23 LAB — PREGNANCY, URINE: Preg Test, Ur: NEGATIVE

## 2011-02-23 MED ORDER — CYCLOBENZAPRINE HCL 10 MG PO TABS: 10.0000 mg | ORAL_TABLET | Freq: Two times a day (BID) | ORAL | Status: AC | PRN

## 2011-02-23 MED ORDER — ONDANSETRON HCL 4 MG/2ML IJ SOLN
4.0000 mg | Freq: Once | INTRAMUSCULAR | Status: AC
  Administered 2011-02-23: 4 mg via INTRAVENOUS
  Filled 2011-02-23: qty 2

## 2011-02-23 MED ORDER — ONDANSETRON HCL 4 MG/5ML PO SOLN: 4.0000 mg | Freq: Once | ORAL | Status: DC

## 2011-02-23 MED ORDER — KETOROLAC TROMETHAMINE 30 MG/ML IJ SOLN
30.0000 mg | Freq: Once | INTRAMUSCULAR | Status: AC
  Administered 2011-02-23: 30 mg via INTRAVENOUS
  Filled 2011-02-23: qty 1

## 2011-02-23 MED ORDER — HYDROMORPHONE HCL 1 MG/ML IJ SOLN
1.0000 mg | Freq: Once | INTRAMUSCULAR | Status: AC
  Administered 2011-02-23: 1 mg via INTRAVENOUS
  Filled 2011-02-23: qty 1

## 2011-02-23 MED ORDER — HYDROCODONE-ACETAMINOPHEN 5-325 MG PO TABS: 1.0000 | ORAL_TABLET | ORAL | Status: AC | PRN

## 2011-02-23 NOTE — ED Notes (Signed)
Pt states her feet slipped from under her and she feel in the bathtub. Complain of pain in her lower back

## 2011-02-23 NOTE — ED Provider Notes (Signed)
History   Chart scribed for Donnetta Hutching, MD by Enos Fling; the patient was seen in room APA18/APA18; this patient's care was started at 1:15 PM.    CSN: [id number] Arrival date & time: 02/23/2011 12:13 PM  Chief Complaint  Patient presents with  . Fall    HPI [name] is a 50 y.o. female who presents to the Emergency Department complaining of back pain. Pt c/o increased low back pain s/p slip and fall in her bathtub this AM. Pt admits to chronic low back pain d/t spinal fractures and "nerve damage" but states pain has been much worse since the fall. Pain radiates down BLE but pt states this is not new for her. No other complaints. Denies head injury, LOC, headache, or neck pain. Denies numbness, tingling, or bowel/bladder incontinence. Pt in usual state of health prior to injury and has no other complaints.    Past Medical History  Diagnosis Date  . Hypertension   . Depression   . Anxiety   . Disc degeneration, lumbar     History reviewed. No pertinent past surgical history.  History reviewed. No pertinent family history.  History  Substance Use Topics  . Smoking status: Not on file  . Smokeless tobacco: Not on file  . Alcohol Use:     OB History    Grav Para Term Preterm Abortions TAB SAB Ect Mult Living                  Review of Systems 10 Systems reviewed and are negative for acute change except as noted in the HPI.  Allergies  Codeine  Home Medications   Current Outpatient Rx  Name Route Sig Dispense Refill  . CYCLOBENZAPRINE HCL 10 MG PO TABS Oral Take 1 tablet (10 mg total) by mouth 2 (two) times daily as needed for muscle spasms. 20 tablet 0  . HYDROCODONE-ACETAMINOPHEN 5-325 MG PO TABS Oral Take 1-2 tablets by mouth every 4 (four) hours as needed for pain. 20 tablet 0    Physical Exam    BP 153/92  Pulse 88  Temp(Src) 98.4 F (36.9 C) (Oral)  Resp 18  Ht 5\' 6"  (1.676 m)  Wt 180 lb (81.647 kg)  BMI 29.05 kg/m2  SpO2 98%  Physical  Exam  Nursing note and vitals reviewed. Constitutional: She is oriented to person, place, and time. No distress.       Appearance consistent with age of record  HENT:  Head: Normocephalic and atraumatic.  Right Ear: External ear normal.  Left Ear: External ear normal.  Nose: Nose normal.  Mouth/Throat: Oropharynx is clear and moist.  Eyes: Conjunctivae are normal.  Neck: Neck supple. No spinous process tenderness and no muscular tenderness present.  Cardiovascular: Normal rate and regular rhythm.  Exam reveals no gallop and no friction rub.   No murmur heard. Pulmonary/Chest: Effort normal and breath sounds normal. She has no wheezes. She has no rhonchi. She has no rales. She exhibits no tenderness.  Abdominal: Soft. There is no tenderness.  Musculoskeletal: Normal range of motion.       Diffuse lumbar and sacral tenderness, no point tenderness; pain with bilateral straight leg raise; Normal appearance of extremities  Neurological: She is alert and oriented to person, place, and time. No sensory deficit.  Skin: No rash noted.       Color normal  Psychiatric: She has a normal mood and affect.    ED Course  Procedures - none  OTHER DATA REVIEWED:  Nursing notes and vital signs reviewed.   LABS / RADIOLOGY:   Dg Lumbar Spine Complete  02/23/2011  *RADIOLOGY REPORT*  Clinical Data: Low back pain, tail bone pain, right hip pain, fell in bathtub  LUMBAR SPINE - COMPLETE 4+ VIEW  Comparison: 09/11/2008  Findings: Five non-rib bearing lumbar vertebrae. Vertebral body and disc space heights maintained. No acute fracture, subluxation or bone destruction. No spondylolysis. SI joints symmetric. Osseous mineralization grossly normal.  IMPRESSION: No acute abnormalities.  Original Report Authenticated By: Lollie Marrow, M.D.   Dg Pelvis 1-2 Views  02/23/2011  *RADIOLOGY REPORT*  Clinical Data: Pain, fell in bathtub  PELVIS - 1-2 VIEW  Comparison: None  Findings: Symmetric hip and SI joints.  Bones appear mildly demineralized. Question bone island versus phlebolith right sacrum. No acute fracture, dislocation or bone destruction.  IMPRESSION: No acute bony abnormalities.  Original Report Authenticated By: Lollie Marrow, M.D.      MDM: X-rays of lower back and pelvis were normal. Rx pain medicine and muscle relaxers. No radicular symptoms.  IMPRESSION: 1. Fall   2. Contusion of lower back      PLAN: Discharge home All results reviewed and discussed with pt, questions answered, pt agreeable with plan.   MEDS GIVEN IN ED: HYDROmorphone (DILAUDID) injection 1 mg (1 mg Intravenous Given 02/23/11 1308)  ondansetron (ZOFRAN) injection 4 mg (4 mg Intravenous Given 02/23/11 1308)     DISCHARGE MEDICATIONS: New Prescriptions   CYCLOBENZAPRINE (FLEXERIL) 10 MG TABLET    Take 1 tablet (10 mg total) by mouth 2 (two) times daily as needed for muscle spasms.   HYDROCODONE-ACETAMINOPHEN (NORCO) 5-325 MG PER TABLET    Take 1-2 tablets by mouth every 4 (four) hours as needed for pain.     SCRIBE ATTESTATION: I personally performed the services described in this documentation, which was scribed in my presence. The recorded information has been reviewed and considered. Donnetta Hutching, MD        Donnetta Hutching, MD 02/23/11 [phone]

## 2011-02-23 NOTE — ED Notes (Signed)
Dr. Adriana Simas in room and removed c-collar and LSB from pt.

## 2011-02-23 NOTE — ED Notes (Signed)
Pt requesting pain medication. Dr. Adriana Simas aware.

## 2011-02-27 LAB — DIFFERENTIAL
Eosinophils Absolute: 0.2
Eosinophils Relative: 2
Lymphs Abs: 3.3
Monocytes Relative: 4
Neutrophils Relative %: 68

## 2011-02-27 LAB — CBC
HCT: 37.2
MCV: 89.8
RBC: 4.14
WBC: 13.2 — ABNORMAL HIGH

## 2011-03-02 LAB — CBC
HCT: 39.9
MCHC: 33.5
MCV: 91.7
RBC: 4.35

## 2011-03-02 LAB — DIFFERENTIAL
Basophils Relative: 1
Eosinophils Absolute: 0.2
Eosinophils Relative: 2
Monocytes Relative: 6
Neutrophils Relative %: 61

## 2011-03-02 LAB — IRON AND TIBC: TIBC: 374

## 2011-03-05 LAB — COMPREHENSIVE METABOLIC PANEL
ALT: 20
Alkaline Phosphatase: 91
BUN: 7
CO2: 25
Chloride: 104
GFR calc non Af Amer: >60
Glucose, Bld: 93
Potassium: 3.6
Sodium: 138
Total Bilirubin: 0.4

## 2011-03-05 LAB — FOLATE: Folate: 2.3

## 2011-03-05 LAB — CBC
HCT: 39.6
Hemoglobin: 13.6
MCV: 93.8
Platelets: 435 — ABNORMAL HIGH
RBC: 4.22
WBC: 13.8 — ABNORMAL HIGH

## 2011-03-05 LAB — COPPER, SERUM: Copper: 167 ug/dL — ABNORMAL HIGH (ref 70–155)

## 2011-03-07 LAB — BASIC METABOLIC PANEL
BUN: 5 — ABNORMAL LOW
Creatinine, Ser: 0.59
GFR calc non Af Amer: >60
Glucose, Bld: 106 — ABNORMAL HIGH

## 2011-03-07 LAB — DIFFERENTIAL
Basophils Absolute: 0.1
Eosinophils Absolute: 0.2
Eosinophils Relative: 1
Lymphocytes Relative: 25
Lymphs Abs: 3.2
Neutrophils Relative %: 67

## 2011-03-07 LAB — CBC
HCT: 42.1
MCV: 93.7
Platelets: 456 — ABNORMAL HIGH
RDW: 14.6
WBC: 12.6 — ABNORMAL HIGH

## 2011-03-09 LAB — CBC
Hemoglobin: 13.9
MCHC: 34.3
Platelets: 536 — ABNORMAL HIGH
RDW: 13.8

## 2011-03-09 LAB — DIFFERENTIAL
Band Neutrophils: 0
Blasts: 0
Lymphocytes Relative: 45
Monocytes Relative: 4
Neutrophils Relative %: 51
Promyelocytes Absolute: 0
Smear Review: INCREASED
nRBC: 0

## 2011-03-13 LAB — IRON AND TIBC
Saturation Ratios: 19 — ABNORMAL LOW
UIBC: 314

## 2011-03-13 LAB — DIFFERENTIAL
Eosinophils Absolute: 0.3
Eosinophils Relative: 3
Lymphocytes Relative: 36
Lymphs Abs: 3.5
Monocytes Absolute: 0.7

## 2011-03-13 LAB — COMPREHENSIVE METABOLIC PANEL
ALT: 17
AST: 21
Albumin: 3.5
CO2: 30
Chloride: 107
Creatinine, Ser: 0.51
GFR calc Af Amer: >60
Sodium: 140
Total Bilirubin: 0.5

## 2011-03-13 LAB — CBC
MCV: 89.4
Platelets: 459 — ABNORMAL HIGH
RBC: 4.07
WBC: 9.9

## 2011-03-15 LAB — CLOSTRIDIUM DIFFICILE EIA: C difficile Toxins A+B, EIA: NEGATIVE

## 2011-03-15 LAB — OVA AND PARASITE EXAMINATION: Ova and parasites: NONE SEEN

## 2011-03-15 LAB — FECAL LACTOFERRIN, QUANT

## 2011-03-16 LAB — URINALYSIS, ROUTINE W REFLEX MICROSCOPIC
Bilirubin Urine: NEGATIVE
Glucose, UA: NEGATIVE
Hgb urine dipstick: NEGATIVE
Ketones, ur: NEGATIVE
pH: 6

## 2011-03-16 LAB — CBC
HCT: 38.6
Hemoglobin: 13.1
MCV: 88.7
RDW: 13.9

## 2011-03-16 LAB — BASIC METABOLIC PANEL
CO2: 26
Chloride: 106
GFR calc non Af Amer: >60
Glucose, Bld: 100 — ABNORMAL HIGH
Potassium: 3 — ABNORMAL LOW
Sodium: 138

## 2011-03-16 LAB — DIFFERENTIAL
Basophils Absolute: 0.2 — ABNORMAL HIGH
Eosinophils Relative: 3
Lymphocytes Relative: 40
Monocytes Absolute: 0.8 — ABNORMAL HIGH

## 2011-03-19 LAB — CBC
HCT: 38.1
Hemoglobin: 12.9
MCHC: 33.9
RDW: 13.6

## 2011-03-19 LAB — DIFFERENTIAL
Lymphs Abs: 3.9 — ABNORMAL HIGH
Monocytes Relative: 4
Neutro Abs: 8.2 — ABNORMAL HIGH
Neutrophils Relative %: 63

## 2011-03-19 LAB — COMPREHENSIVE METABOLIC PANEL
BUN: 6
Calcium: 9.1
Glucose, Bld: 98
Sodium: 140
Total Protein: 6

## 2011-05-09 ENCOUNTER — Telehealth: Payer: Self-pay | Admitting: Oncology

## 2011-05-09 NOTE — Telephone Encounter (Signed)
called pts home lmovm to rtn call to schedule new pt appt

## 2011-05-10 ENCOUNTER — Telehealth: Payer: Self-pay | Admitting: Oncology

## 2011-05-10 NOTE — Telephone Encounter (Signed)
called pts lmovm to rtn call to schedule new pt appt

## 2011-05-14 ENCOUNTER — Telehealth: Payer: Self-pay | Admitting: Oncology

## 2011-05-14 NOTE — Telephone Encounter (Signed)
No response from pt, will fax over a letter to Dr. Assunta Found office to informed them

## 2011-05-17 ENCOUNTER — Telehealth: Payer: Self-pay | Admitting: Oncology

## 2011-05-17 NOTE — Telephone Encounter (Signed)
pt rtn call and scheduled new pt appt for 06/14/2011.  pt stated that she needed a afternoon appt.  did not mind waiting to jan2013

## 2011-06-14 ENCOUNTER — Ambulatory Visit: Payer: Medicare Other

## 2011-06-14 ENCOUNTER — Other Ambulatory Visit: Payer: Medicare Other | Admitting: Lab

## 2011-06-14 ENCOUNTER — Ambulatory Visit: Payer: Medicare Other | Admitting: Oncology

## 2011-12-10 ENCOUNTER — Other Ambulatory Visit (HOSPITAL_COMMUNITY): Payer: Self-pay | Admitting: Physician Assistant

## 2011-12-10 DIAGNOSIS — R109 Unspecified abdominal pain: Secondary | ICD-10-CM

## 2011-12-10 DIAGNOSIS — R112 Nausea with vomiting, unspecified: Secondary | ICD-10-CM

## 2011-12-12 ENCOUNTER — Ambulatory Visit (HOSPITAL_COMMUNITY)
Admission: RE | Admit: 2011-12-12 | Discharge: 2011-12-12 | Disposition: A | Discharge status: Home / Self Care | Payer: Medicare Other | Source: Ambulatory Visit | Attending: Physician Assistant | Admitting: Physician Assistant

## 2011-12-12 DIAGNOSIS — R112 Nausea with vomiting, unspecified: Secondary | ICD-10-CM | POA: Insufficient documentation

## 2011-12-12 DIAGNOSIS — K7689 Other specified diseases of liver: Secondary | ICD-10-CM | POA: Insufficient documentation

## 2011-12-12 DIAGNOSIS — R109 Unspecified abdominal pain: Secondary | ICD-10-CM | POA: Insufficient documentation

## 2011-12-13 ENCOUNTER — Other Ambulatory Visit (HOSPITAL_COMMUNITY): Payer: Self-pay | Admitting: Physician Assistant

## 2011-12-13 DIAGNOSIS — K829 Disease of gallbladder, unspecified: Secondary | ICD-10-CM

## 2011-12-17 ENCOUNTER — Encounter (HOSPITAL_COMMUNITY): Payer: Medicare Other

## 2011-12-19 ENCOUNTER — Ambulatory Visit: Payer: Medicare Other

## 2012-01-01 ENCOUNTER — Ambulatory Visit (HOSPITAL_BASED_OUTPATIENT_CLINIC_OR_DEPARTMENT_OTHER): Payer: Medicare Other | Admitting: Oncology

## 2012-01-01 ENCOUNTER — Other Ambulatory Visit (HOSPITAL_BASED_OUTPATIENT_CLINIC_OR_DEPARTMENT_OTHER): Payer: Medicare Other | Admitting: Lab

## 2012-01-01 ENCOUNTER — Ambulatory Visit: Payer: Medicare Other

## 2012-01-01 ENCOUNTER — Encounter: Payer: Self-pay | Admitting: Oncology

## 2012-01-01 ENCOUNTER — Other Ambulatory Visit: Payer: Self-pay | Admitting: *Deleted

## 2012-01-01 VITALS — BP 111/75 | HR 90 | Temp 98.2°F | Ht 66.0 in | Wt 159.3 lb

## 2012-01-01 DIAGNOSIS — D473 Essential (hemorrhagic) thrombocythemia: Secondary | ICD-10-CM

## 2012-01-01 DIAGNOSIS — D72829 Elevated white blood cell count, unspecified: Secondary | ICD-10-CM | POA: Insufficient documentation

## 2012-01-01 DIAGNOSIS — C50919 Malignant neoplasm of unspecified site of unspecified female breast: Secondary | ICD-10-CM

## 2012-01-01 DIAGNOSIS — D75839 Thrombocytosis, unspecified: Secondary | ICD-10-CM | POA: Insufficient documentation

## 2012-01-01 DIAGNOSIS — F411 Generalized anxiety disorder: Secondary | ICD-10-CM

## 2012-01-01 DIAGNOSIS — Z1231 Encounter for screening mammogram for malignant neoplasm of breast: Secondary | ICD-10-CM

## 2012-01-01 DIAGNOSIS — J449 Chronic obstructive pulmonary disease, unspecified: Secondary | ICD-10-CM

## 2012-01-01 LAB — CBC WITH DIFFERENTIAL/PLATELET
Eosinophils Absolute: 0.7 10*3/uL — ABNORMAL HIGH (ref 0.0–0.5)
HCT: 40.5 % (ref 34.8–46.6)
LYMPH%: 57.6 % — ABNORMAL HIGH (ref 14.0–49.7)
MONO#: 0.6 10*3/uL (ref 0.1–0.9)
NEUT#: 4.2 10*3/uL (ref 1.5–6.5)
NEUT%: 31.4 % — ABNORMAL LOW (ref 38.4–76.8)
Platelets: 501 10*3/uL — ABNORMAL HIGH (ref 145–400)
RBC: 4.72 10*6/uL (ref 3.70–5.45)
WBC: 13.3 10*3/uL — ABNORMAL HIGH (ref 3.9–10.3)

## 2012-01-01 LAB — COMPREHENSIVE METABOLIC PANEL
CO2: 24 mEq/L (ref 19–32)
Calcium: 9.3 mg/dL (ref 8.4–10.5)
Chloride: 107 mEq/L (ref 96–112)
Glucose, Bld: 86 mg/dL (ref 70–99)
Sodium: 141 mEq/L (ref 135–145)
Total Bilirubin: 0.4 mg/dL (ref 0.3–1.2)
Total Protein: 6.3 g/dL (ref 6.0–8.3)
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
## 2012-01-02 ENCOUNTER — Telehealth: Payer: Self-pay | Admitting: *Deleted

## 2012-01-02 NOTE — Progress Notes (Signed)
01/02/2012 addendum: Have received additional data on [name]. She saw Dr. Mariel Sleet as recently as August 2011 for a history of #1 folate acid deficiency, status post replacement, #2 leukocytosis, which he felt was secondary to her smoking #3 intermittent thrombocytosis, as well as problems with COPD, and anxiety and depression. Some additional lab work is also being scanned in to  [facility]

## 2012-01-02 NOTE — Telephone Encounter (Signed)
spoke with Jeanie Cooks at [facility] she will call the patient and set up the date and time for the mammograph and lab appointment

## 2012-01-03 ENCOUNTER — Ambulatory Visit (HOSPITAL_COMMUNITY)
Admission: RE | Admit: 2012-01-03 | Discharge: 2012-01-03 | Disposition: A | Discharge status: Home / Self Care | Payer: Medicare Other | Source: Ambulatory Visit | Attending: Physician Assistant | Admitting: Physician Assistant

## 2012-01-03 ENCOUNTER — Other Ambulatory Visit (HOSPITAL_COMMUNITY): Payer: Self-pay | Admitting: Physician Assistant

## 2012-01-03 DIAGNOSIS — R1011 Right upper quadrant pain: Secondary | ICD-10-CM | POA: Insufficient documentation

## 2012-01-03 DIAGNOSIS — G8929 Other chronic pain: Secondary | ICD-10-CM

## 2012-01-03 DIAGNOSIS — R110 Nausea: Secondary | ICD-10-CM | POA: Insufficient documentation

## 2012-01-03 DIAGNOSIS — Z139 Encounter for screening, unspecified: Secondary | ICD-10-CM

## 2012-01-03 DIAGNOSIS — K829 Disease of gallbladder, unspecified: Secondary | ICD-10-CM | POA: Insufficient documentation

## 2012-01-03 MED ORDER — TECHNETIUM TC 99M MEBROFENIN IV KIT
5.0000 | PACK | Freq: Once | INTRAVENOUS | Status: AC | PRN
  Administered 2012-01-03: 5 via INTRAVENOUS

## 2012-01-08 ENCOUNTER — Ambulatory Visit (HOSPITAL_COMMUNITY): Payer: Medicare Other

## 2012-01-08 ENCOUNTER — Other Ambulatory Visit (HOSPITAL_COMMUNITY): Payer: Medicare Other

## 2012-01-09 ENCOUNTER — Other Ambulatory Visit (HOSPITAL_COMMUNITY): Payer: Medicare Other

## 2012-01-18 ENCOUNTER — Other Ambulatory Visit (HOSPITAL_COMMUNITY): Payer: Medicare Other

## 2012-01-18 ENCOUNTER — Ambulatory Visit (HOSPITAL_COMMUNITY): Payer: Medicare Other

## 2012-01-25 ENCOUNTER — Other Ambulatory Visit (HOSPITAL_BASED_OUTPATIENT_CLINIC_OR_DEPARTMENT_OTHER): Payer: Medicare Other

## 2012-01-25 ENCOUNTER — Other Ambulatory Visit (HOSPITAL_COMMUNITY)
Admission: RE | Admit: 2012-01-25 | Discharge: 2012-01-25 | Disposition: A | Discharge status: Home / Self Care | Payer: Medicare Other | Source: Ambulatory Visit | Attending: Oncology | Admitting: Oncology

## 2012-01-25 DIAGNOSIS — D75839 Thrombocytosis, unspecified: Secondary | ICD-10-CM

## 2012-01-25 DIAGNOSIS — D72829 Elevated white blood cell count, unspecified: Secondary | ICD-10-CM

## 2012-01-25 DIAGNOSIS — D473 Essential (hemorrhagic) thrombocythemia: Secondary | ICD-10-CM

## 2012-01-25 DIAGNOSIS — C9110 Chronic lymphocytic leukemia of B-cell type not having achieved remission: Secondary | ICD-10-CM | POA: Insufficient documentation

## 2012-01-25 LAB — CBC WITH DIFFERENTIAL/PLATELET
Basophils Absolute: 0.1 10*3/uL (ref 0.0–0.1)
Eosinophils Absolute: 0.9 10*3/uL — ABNORMAL HIGH (ref 0.0–0.5)
HGB: 12.6 g/dL (ref 11.6–15.9)
LYMPH%: 50.5 % — ABNORMAL HIGH (ref 14.0–49.7)
MCV: 86.2 fL (ref 79.5–101.0)
MONO%: 6.4 % (ref 0.0–14.0)
NEUT#: 5.3 10*3/uL (ref 1.5–6.5)
NEUT%: 36.4 % — ABNORMAL LOW (ref 38.4–76.8)
Platelets: 488 10*3/uL — ABNORMAL HIGH (ref 145–400)

## 2012-01-28 ENCOUNTER — Other Ambulatory Visit: Payer: Self-pay | Admitting: Oncology

## 2012-02-07 ENCOUNTER — Encounter: Payer: Self-pay | Admitting: Internal Medicine

## 2012-02-11 ENCOUNTER — Ambulatory Visit: Payer: Medicare Other

## 2012-02-11 ENCOUNTER — Other Ambulatory Visit: Payer: Medicare Other | Admitting: Lab

## 2012-02-13 ENCOUNTER — Telehealth: Payer: Self-pay | Admitting: *Deleted

## 2012-02-13 NOTE — Telephone Encounter (Signed)
This RN called pt per VM request to discuss results of labs done in mid August.  Lab results reviewed and questions answered including informing pt present plan of care is to monitor only and check labs on a routine basis.  Pt verbalized understanding of above.

## 2012-02-19 ENCOUNTER — Other Ambulatory Visit: Payer: Self-pay | Admitting: Oncology

## 2012-02-24 ENCOUNTER — Other Ambulatory Visit: Payer: Self-pay | Admitting: Oncology

## 2012-02-24 ENCOUNTER — Encounter: Payer: Self-pay | Admitting: Oncology

## 2012-02-25 ENCOUNTER — Telehealth: Payer: Self-pay | Admitting: Oncology

## 2012-02-25 NOTE — Telephone Encounter (Signed)
Letter sent to pt.and the scanning center. °

## 2012-08-29 ENCOUNTER — Ambulatory Visit (HOSPITAL_COMMUNITY): Payer: Medicare Other | Admitting: Oncology

## 2012-09-10 ENCOUNTER — Encounter (HOSPITAL_COMMUNITY): Payer: Medicare Other | Attending: Oncology | Admitting: Oncology

## 2012-09-10 ENCOUNTER — Encounter (HOSPITAL_COMMUNITY): Payer: Self-pay | Admitting: Oncology

## 2012-09-10 VITALS — BP 108/66 | HR 87 | Temp 97.4°F | Resp 18 | Ht 66.0 in | Wt 177.0 lb

## 2012-09-10 DIAGNOSIS — F341 Dysthymic disorder: Secondary | ICD-10-CM | POA: Insufficient documentation

## 2012-09-10 DIAGNOSIS — K7689 Other specified diseases of liver: Secondary | ICD-10-CM

## 2012-09-10 DIAGNOSIS — D72829 Elevated white blood cell count, unspecified: Secondary | ICD-10-CM

## 2012-09-10 DIAGNOSIS — R5381 Other malaise: Secondary | ICD-10-CM | POA: Insufficient documentation

## 2012-09-10 DIAGNOSIS — R5383 Other fatigue: Secondary | ICD-10-CM | POA: Insufficient documentation

## 2012-09-10 DIAGNOSIS — E538 Deficiency of other specified B group vitamins: Secondary | ICD-10-CM | POA: Insufficient documentation

## 2012-09-10 DIAGNOSIS — J449 Chronic obstructive pulmonary disease, unspecified: Secondary | ICD-10-CM

## 2012-09-10 LAB — COMPREHENSIVE METABOLIC PANEL
AST: 45 U/L — ABNORMAL HIGH (ref 0–37)
Albumin: 3.7 g/dL (ref 3.5–5.2)
Calcium: 9.2 mg/dL (ref 8.4–10.5)
Creatinine, Ser: 0.56 mg/dL (ref 0.50–1.10)

## 2012-09-10 LAB — CBC WITH DIFFERENTIAL/PLATELET
Eosinophils Absolute: 0.7 10*3/uL (ref 0.0–0.7)
Eosinophils Relative: 5 % (ref 0–5)
MCH: 29.1 pg (ref 26.0–34.0)
MCHC: 33.8 g/dL (ref 30.0–36.0)
Monocytes Absolute: 0.8 10*3/uL (ref 0.1–1.0)
Neutrophils Relative %: 40 % — ABNORMAL LOW (ref 43–77)
Platelets: 540 10*3/uL — ABNORMAL HIGH (ref 150–400)
RBC: 4.71 MIL/uL (ref 3.87–5.11)

## 2012-09-10 NOTE — Progress Notes (Signed)
#  1 leukocytosis and the patient tells me that she was diagnosed with "leukemia" last August but was not given details of the diagnosis. No therapy was recommended she states. We are trying to retrieve the flow cytometry report from August 2013 ( ADD: Flow cytometry has been obtained and is negative)  #2 COPD still smoking a pack a day and she has been a smoker greater than 30 pack years of 1 to 2 packs of cigarettes per day. She is more short of breath ever before therefore needs CAT scan of her chest.  #3 hepatosplenomegaly and she needs a CAT scan with contrast upper abdomen to make sure she does not have intra-hepatic lesions.  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 Interestingly she is no longer on folic acid  I have not seen this young lady since August 2011. She was however seen in [place] last year at the oncology clinic.  She has not lost weight. She is asked to gained weight. Her weight is up 12 pounds since last July. She does have fevers chills or night sweats. She is not aware of adenopathy. She does have some nonspecific abdominal discomfort intermittently but states her bowels are working fairly well.  BP 108/66  Pulse 87  Temp(Src) 97.4 F (36.3 C) (Oral)  Resp 18  Ht 5\' 6"  (1.676 m)  Wt 177 lb (80.287 kg)  BMI 28.58 kg/m2  She is in no acute distress. He still has no adenopathy in the cervical, supraclavicular, infraclavicular, axillary, epitrochlear, or inguinal areas. She has no arm or leg edema. Her lungs are clear but with markedly diminished breath sounds. There are some rhonchi which clear with coughing. She has no thyromegaly. Facial symmetry is intact. Breast exam is negative for masses. Heart shows a regular rhythm and rate without murmur rub or gallop. Abdomen does reveal a palpable spleen tip in the left upper quadrant on inspiration at least 2 cm below the costal margin and  her liver edge right midclavicular line is palpated 6-7 cm below the costal margin.  Bowel sounds are present. She has no skin abnormalities that are apparent.  I do think she needs a CAT scan of her chest abdomen and pelvis, further blood work, and I will see her back in a couple weeks. She has a host of issues. I still think for anxiety depression are one of her major issues. She also has chronic back pain she states which keeps her from driving, etc. her legs are involved with this chronic debilitating pain issue

## 2012-09-10 NOTE — Patient Instructions (Addendum)
[facility] Discharge Instructions  RECOMMENDATIONS MADE BY THE CONSULTANT AND ANY TEST RESULTS WILL BE SENT TO YOUR REFERRING PHYSICIAN.  EXAM FINDINGS BY THE PHYSICIAN TODAY AND SIGNS OR SYMPTOMS TO REPORT TO CLINIC OR PRIMARY PHYSICIAN: Exam and discussion by MD.   Need to do some blood work today and get a CT scan of your chest, abdomen and pelvis.  MEDICATIONS PRESCRIBED:  none  INSTRUCTIONS GIVEN AND DISCUSSED: Prep for CT scans discussed.  SPECIAL INSTRUCTIONS/FOLLOW-UP: CTs of chest, abdomen and pelvis on Monday and will get you back for follow-up in a few weeks.  Thank you for choosing [facility] to provide your oncology and hematology care.  To afford each patient quality time with our providers, please arrive at least 15 minutes before your scheduled appointment time.  With your help, our goal is to use those 15 minutes to complete the necessary work-up to ensure our physicians have the information they need to help with your evaluation and healthcare recommendations.    Effective January 1st, 2014, we ask that you re-schedule your appointment with our physicians should you arrive 10 or more minutes late for your appointment.  We strive to give you quality time with our providers, and arriving late affects you and other patients whose appointments are after yours.    Again, thank you for choosing [facility].  Our hope is that these requests will decrease the amount of time that you wait before being seen by our physicians.       _____________________________________________________________  Should you have questions after your visit to [facility], please contact our office at [phone] between the hours of 8:30 a.m. and 5:00 p.m.  Voicemails left after 4:30 p.m. will not be returned until the following business day.  For prescription refill requests, have your pharmacy contact our office with your prescription  refill request.

## 2012-09-10 NOTE — Progress Notes (Signed)
[name] presented for [employer]. Labs per MD order drawn via Peripheral Line 23 gauge needle inserted in right hand  Good blood return present. Procedure without incident.  Needle removed intact. Patient tolerated procedure well.

## 2012-09-12 ENCOUNTER — Other Ambulatory Visit (HOSPITAL_COMMUNITY): Payer: Self-pay | Admitting: Oncology

## 2012-09-12 DIAGNOSIS — E876 Hypokalemia: Secondary | ICD-10-CM

## 2012-09-12 MED ORDER — POTASSIUM CHLORIDE CRYS ER 20 MEQ PO TBCR: 20.0000 meq | EXTENDED_RELEASE_TABLET | Freq: Every day | ORAL | Status: DC

## 2012-09-15 ENCOUNTER — Ambulatory Visit (HOSPITAL_COMMUNITY): Admission: RE | Admit: 2012-09-15 | Payer: Medicare Other | Source: Ambulatory Visit

## 2012-09-15 ENCOUNTER — Ambulatory Visit (HOSPITAL_COMMUNITY): Payer: Medicare Other

## 2012-09-15 ENCOUNTER — Encounter: Payer: Self-pay | Admitting: Gastroenterology

## 2012-09-22 ENCOUNTER — Ambulatory Visit (HOSPITAL_COMMUNITY)
Admission: RE | Admit: 2012-09-22 | Discharge: 2012-09-22 | Disposition: A | Discharge status: Home / Self Care | Payer: Medicare Other | Source: Ambulatory Visit | Attending: Oncology | Admitting: Oncology

## 2012-09-22 DIAGNOSIS — R160 Hepatomegaly, not elsewhere classified: Secondary | ICD-10-CM | POA: Insufficient documentation

## 2012-09-22 DIAGNOSIS — R0602 Shortness of breath: Secondary | ICD-10-CM | POA: Insufficient documentation

## 2012-09-22 DIAGNOSIS — J449 Chronic obstructive pulmonary disease, unspecified: Secondary | ICD-10-CM

## 2012-09-22 DIAGNOSIS — K7689 Other specified diseases of liver: Secondary | ICD-10-CM | POA: Insufficient documentation

## 2012-09-22 DIAGNOSIS — D72829 Elevated white blood cell count, unspecified: Secondary | ICD-10-CM

## 2012-09-22 MED ORDER — IOHEXOL 300 MG/ML  SOLN
100.0000 mL | Freq: Once | INTRAMUSCULAR | Status: AC | PRN
  Administered 2012-09-22: 100 mL via INTRAVENOUS

## 2012-09-23 ENCOUNTER — Telehealth (HOSPITAL_COMMUNITY): Payer: Self-pay

## 2012-09-23 ENCOUNTER — Ambulatory Visit (HOSPITAL_COMMUNITY): Admission: RE | Admit: 2012-09-23 | Payer: Medicare Other | Source: Ambulatory Visit

## 2012-09-23 ENCOUNTER — Ambulatory Visit (HOSPITAL_COMMUNITY): Payer: Medicare Other

## 2012-09-23 NOTE — Telephone Encounter (Signed)
Patient called to inform of CT results and to remind her of her 4/29 appointment.

## 2012-09-29 ENCOUNTER — Encounter: Payer: Self-pay | Admitting: Internal Medicine

## 2012-09-30 ENCOUNTER — Encounter (HOSPITAL_BASED_OUTPATIENT_CLINIC_OR_DEPARTMENT_OTHER): Payer: Medicare Other | Admitting: Oncology

## 2012-09-30 ENCOUNTER — Encounter (HOSPITAL_COMMUNITY): Payer: Self-pay | Admitting: Oncology

## 2012-09-30 VITALS — BP 109/67 | HR 80 | Temp 98.3°F | Resp 16 | Wt 178.3 lb

## 2012-09-30 DIAGNOSIS — R160 Hepatomegaly, not elsewhere classified: Secondary | ICD-10-CM

## 2012-09-30 DIAGNOSIS — F17200 Nicotine dependence, unspecified, uncomplicated: Secondary | ICD-10-CM

## 2012-09-30 DIAGNOSIS — Z8639 Personal history of other endocrine, nutritional and metabolic disease: Secondary | ICD-10-CM

## 2012-09-30 DIAGNOSIS — D473 Essential (hemorrhagic) thrombocythemia: Secondary | ICD-10-CM

## 2012-09-30 DIAGNOSIS — R5381 Other malaise: Secondary | ICD-10-CM

## 2012-09-30 DIAGNOSIS — D72829 Elevated white blood cell count, unspecified: Secondary | ICD-10-CM

## 2012-09-30 DIAGNOSIS — D75839 Thrombocytosis, unspecified: Secondary | ICD-10-CM

## 2012-09-30 DIAGNOSIS — D509 Iron deficiency anemia, unspecified: Secondary | ICD-10-CM

## 2012-09-30 NOTE — Progress Notes (Signed)
#  1 leukocytosis, nonspecific. Flow cytometry last year was actually negative for CLL. Her white count is less than was last year we will just see her again in 6 months  #2 weakness and fatigue with no evidence for cancer on her CAT scans I do think she has no changes of COPD and she still smoking. We talked about sensation once again  #3 history of folic acid deficiency in the past and you need to repeat that levels and she is not truly changed her dietary intake of greens and therefore we will see what it shows that.  #4 history of iron deficiency even though her hemoglobin is still good will check her iron stores presently  #5 hepatomegaly with fatty infiltration of the liver and most likely this is weight related.  #6 anxiety depression  She really does not have any of his for leukemia. She also has no lymphadenopathy and no primary lung lesion etc. She really does need to quit smoking since she is still smoking a pack a day and has been smoking essentially now since age 34 she recalls today.  We'll see her in 6 months check a folic acid and iron stores and will be in touch with her.

## 2012-09-30 NOTE — Patient Instructions (Signed)
[facility] Discharge Instructions  RECOMMENDATIONS MADE BY THE CONSULTANT AND ANY TEST RESULTS WILL BE SENT TO YOUR REFERRING PHYSICIAN.  EXAM FINDINGS BY THE PHYSICIAN TODAY AND SIGNS OR SYMPTOMS TO REPORT TO CLINIC OR PRIMARY PHYSICIAN:   Labs today.   Return in 6 months for labs and then to see Dr. Mariel Sleet after labs.   Dr. Mariel Sleet wants you to STOP SMOKING!!!!!   Thank you for choosing [facility] to provide your oncology and hematology care.  To afford each patient quality time with our providers, please arrive at least 15 minutes before your scheduled appointment time.  With your help, our goal is to use those 15 minutes to complete the necessary work-up to ensure our physicians have the information they need to help with your evaluation and healthcare recommendations.    Effective January 1st, 2014, we ask that you re-schedule your appointment with our physicians should you arrive 10 or more minutes late for your appointment.  We strive to give you quality time with our providers, and arriving late affects you and other patients whose appointments are after yours.    Again, thank you for choosing [facility].  Our hope is that these requests will decrease the amount of time that you wait before being seen by our physicians.       _____________________________________________________________  Should you have questions after your visit to [facility], please contact our office at [phone] between the hours of 8:30 a.m. and 5:00 p.m.  Voicemails left after 4:30 p.m. will not be returned until the following business day.  For prescription refill requests, have your pharmacy contact our office with your prescription refill request.

## 2012-10-01 ENCOUNTER — Other Ambulatory Visit (HOSPITAL_COMMUNITY): Payer: Self-pay

## 2012-10-01 ENCOUNTER — Ambulatory Visit: Payer: Medicare Other | Admitting: Gastroenterology

## 2012-10-01 DIAGNOSIS — E538 Deficiency of other specified B group vitamins: Secondary | ICD-10-CM

## 2012-10-01 LAB — IRON AND TIBC
Iron: 57 ug/dL (ref 42–135)
Saturation Ratios: 14 % — ABNORMAL LOW (ref 20–55)
TIBC: 412 ug/dL (ref 250–470)

## 2012-10-01 MED ORDER — FOLIC ACID 1 MG PO TABS: ORAL_TABLET | ORAL | Status: DC

## 2012-10-03 ENCOUNTER — Other Ambulatory Visit (HOSPITAL_COMMUNITY): Payer: Self-pay | Admitting: Oncology

## 2012-10-03 ENCOUNTER — Encounter (HOSPITAL_COMMUNITY): Payer: Medicare Other | Attending: Oncology

## 2012-10-03 VITALS — BP 122/72 | HR 79 | Temp 98.1°F | Resp 18

## 2012-10-03 DIAGNOSIS — D75839 Thrombocytosis, unspecified: Secondary | ICD-10-CM

## 2012-10-03 DIAGNOSIS — K920 Hematemesis: Secondary | ICD-10-CM | POA: Insufficient documentation

## 2012-10-03 DIAGNOSIS — D500 Iron deficiency anemia secondary to blood loss (chronic): Secondary | ICD-10-CM

## 2012-10-03 DIAGNOSIS — D473 Essential (hemorrhagic) thrombocythemia: Secondary | ICD-10-CM | POA: Insufficient documentation

## 2012-10-03 DIAGNOSIS — E538 Deficiency of other specified B group vitamins: Secondary | ICD-10-CM

## 2012-10-03 DIAGNOSIS — D509 Iron deficiency anemia, unspecified: Secondary | ICD-10-CM

## 2012-10-03 MED ORDER — SODIUM CHLORIDE 0.9 % IV SOLN
1020.0000 mg | Freq: Once | INTRAVENOUS | Status: AC
  Administered 2012-10-03: 1020 mg via INTRAVENOUS
  Filled 2012-10-03: qty 34

## 2012-10-03 MED ORDER — SODIUM CHLORIDE 0.9 % IJ SOLN
10.0000 mL | INTRAMUSCULAR | Status: DC | PRN
  Filled 2012-10-03: qty 10

## 2012-10-03 MED ORDER — SODIUM CHLORIDE 0.9 % IV SOLN
Freq: Once | INTRAVENOUS | Status: AC
  Administered 2012-10-03: 250 mL via INTRAVENOUS

## 2012-10-03 NOTE — Progress Notes (Signed)
[name] tolerated infusion well and without incident; verbalizes understanding for follow-up.  No distress noted at time of discharge and patient was discharged home with her father.

## 2012-10-14 ENCOUNTER — Ambulatory Visit: Payer: Medicare Other | Admitting: Gastroenterology

## 2012-10-18 ENCOUNTER — Encounter: Payer: Self-pay | Admitting: Oncology

## 2012-10-23 ENCOUNTER — Telehealth: Payer: Self-pay | Admitting: Gastroenterology

## 2012-10-23 ENCOUNTER — Ambulatory Visit: Payer: Medicare Other | Admitting: Gastroenterology

## 2012-10-23 NOTE — Telephone Encounter (Signed)
Pt was a no show

## 2012-10-28 NOTE — Telephone Encounter (Signed)
Please send letter for f/u.  

## 2012-11-11 ENCOUNTER — Encounter: Payer: Self-pay | Admitting: Gastroenterology

## 2012-11-11 NOTE — Telephone Encounter (Signed)
MAILED LETTER °

## 2013-02-23 ENCOUNTER — Encounter (HOSPITAL_COMMUNITY): Payer: Self-pay

## 2013-02-23 ENCOUNTER — Encounter (HOSPITAL_COMMUNITY): Payer: Medicare Other | Attending: Oncology

## 2013-02-23 VITALS — BP 141/92 | HR 123 | Temp 98.2°F | Resp 20 | Wt 171.6 lb

## 2013-02-23 DIAGNOSIS — D509 Iron deficiency anemia, unspecified: Secondary | ICD-10-CM | POA: Insufficient documentation

## 2013-02-23 DIAGNOSIS — D72829 Elevated white blood cell count, unspecified: Secondary | ICD-10-CM | POA: Insufficient documentation

## 2013-02-23 DIAGNOSIS — D500 Iron deficiency anemia secondary to blood loss (chronic): Secondary | ICD-10-CM

## 2013-02-23 DIAGNOSIS — E538 Deficiency of other specified B group vitamins: Secondary | ICD-10-CM

## 2013-02-23 LAB — CBC WITH DIFFERENTIAL/PLATELET
Basophils Absolute: 0.1 10*3/uL (ref 0.0–0.1)
Eosinophils Absolute: 0.4 10*3/uL (ref 0.0–0.7)
Lymphs Abs: 6.3 10*3/uL — ABNORMAL HIGH (ref 0.7–4.0)
MCH: 30.4 pg (ref 26.0–34.0)
MCHC: 34.1 g/dL (ref 30.0–36.0)
MCV: 89.1 fL (ref 78.0–100.0)
Monocytes Absolute: 0.7 10*3/uL (ref 0.1–1.0)
Monocytes Relative: 5 % (ref 3–12)
Platelets: 468 10*3/uL — ABNORMAL HIGH (ref 150–400)
RDW: 13.7 % (ref 11.5–15.5)
WBC: 14.7 10*3/uL — ABNORMAL HIGH (ref 4.0–10.5)

## 2013-02-23 LAB — IRON AND TIBC: UIBC: 253 ug/dL (ref 125–400)
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
## 2013-02-23 NOTE — Patient Instructions (Addendum)
[facility] Discharge Instructions  RECOMMENDATIONS MADE BY THE CONSULTANT AND ANY TEST RESULTS WILL BE SENT TO YOUR REFERRING PHYSICIAN.  EXAM FINDINGS BY THE PHYSICIAN TODAY AND SIGNS OR SYMPTOMS TO REPORT TO CLINIC OR PRIMARY PHYSICIAN: [name] seen dr Nelle Don today.  We drew some blood work and keep your upcoming appt to follow up    Thank [name] for choosing [facility] to provide your oncology and hematology care.  To afford each patient quality time with our providers, please arrive at least 15 minutes before your scheduled appointment time.  With your help, our goal is to use those 15 minutes to complete the necessary work-up to ensure our physicians have the information they need to help with your evaluation and healthcare recommendations.    Effective January 1st, 2014, we ask that [name] re-schedule your appointment with our physicians should [name] arrive 10 or more minutes late for your appointment.  We strive to give [name] quality time with our providers, and arriving late affects [name] and other patients whose appointments are after yours.    Again, thank [name] for choosing [facility].  Our hope is that these requests will decrease the amount of time that [name] wait before being seen by our physicians.       _____________________________________________________________  Should [name] have questions after your visit to [facility], please contact our office at [phone] between the hours of 8:30 a.m. and 5:00 p.m.  Voicemails left after 4:30 p.m. will not be returned until the following business day.  For prescription refill requests, have your pharmacy contact our office with your prescription refill request.

## 2013-02-24 LAB — ERYTHROPOIETIN: Erythropoietin: 5.6 m[IU]/mL (ref 2.6–18.5)

## 2013-02-25 NOTE — Progress Notes (Addendum)
[name]'s reason for visit today is for labs as scheduled per MD orders.  Venipuncture performed with a 23 gauge butterfly needle to R Hand.  [name] tolerated procedure well and without incident; questions were answered and patient was discharged.

## 2013-02-27 ENCOUNTER — Ambulatory Visit (HOSPITAL_COMMUNITY): Payer: Medicare Other

## 2013-04-01 ENCOUNTER — Other Ambulatory Visit (HOSPITAL_COMMUNITY): Payer: Medicare Other

## 2013-04-03 ENCOUNTER — Ambulatory Visit (HOSPITAL_COMMUNITY): Payer: Medicare Other

## 2013-05-20 ENCOUNTER — Ambulatory Visit (HOSPITAL_COMMUNITY)
Admission: RE | Admit: 2013-05-20 | Discharge: 2013-05-20 | Disposition: A | Discharge status: Home / Self Care | Payer: Medicare Other | Source: Ambulatory Visit | Attending: Physician Assistant | Admitting: Physician Assistant

## 2013-05-20 ENCOUNTER — Other Ambulatory Visit (HOSPITAL_COMMUNITY): Payer: Self-pay | Admitting: Physician Assistant

## 2013-05-20 DIAGNOSIS — M79642 Pain in left hand: Secondary | ICD-10-CM

## 2013-05-20 DIAGNOSIS — M79609 Pain in unspecified limb: Secondary | ICD-10-CM | POA: Insufficient documentation

## 2013-06-29 ENCOUNTER — Emergency Department (HOSPITAL_COMMUNITY)
Admission: EM | Admit: 2013-06-29 | Discharge: 2013-06-29 | Discharge status: Against Medical Advice | Payer: Medicare Other

## 2013-06-29 NOTE — ED Notes (Signed)
Pt called for triage and no answer 

## 2013-06-29 NOTE — ED Notes (Signed)
Called once for triage- no answer.

## 2013-11-06 ENCOUNTER — Encounter (HOSPITAL_COMMUNITY): Payer: Self-pay

## 2013-11-06 ENCOUNTER — Encounter (HOSPITAL_BASED_OUTPATIENT_CLINIC_OR_DEPARTMENT_OTHER): Payer: Medicare HMO

## 2013-11-06 ENCOUNTER — Encounter (HOSPITAL_COMMUNITY): Payer: Medicare HMO | Attending: Hematology and Oncology

## 2013-11-06 VITALS — BP 144/91 | HR 116 | Temp 97.9°F | Resp 20 | Wt 170.5 lb

## 2013-11-06 DIAGNOSIS — F3289 Other specified depressive episodes: Secondary | ICD-10-CM | POA: Diagnosis not present

## 2013-11-06 DIAGNOSIS — R1012 Left upper quadrant pain: Secondary | ICD-10-CM | POA: Insufficient documentation

## 2013-11-06 DIAGNOSIS — D473 Essential (hemorrhagic) thrombocythemia: Secondary | ICD-10-CM | POA: Diagnosis not present

## 2013-11-06 DIAGNOSIS — K7689 Other specified diseases of liver: Secondary | ICD-10-CM | POA: Diagnosis not present

## 2013-11-06 DIAGNOSIS — M51379 Other intervertebral disc degeneration, lumbosacral region without mention of lumbar back pain or lower extremity pain: Secondary | ICD-10-CM | POA: Insufficient documentation

## 2013-11-06 DIAGNOSIS — J4489 Other specified chronic obstructive pulmonary disease: Secondary | ICD-10-CM | POA: Insufficient documentation

## 2013-11-06 DIAGNOSIS — R5381 Other malaise: Secondary | ICD-10-CM | POA: Insufficient documentation

## 2013-11-06 DIAGNOSIS — R079 Chest pain, unspecified: Secondary | ICD-10-CM | POA: Diagnosis not present

## 2013-11-06 DIAGNOSIS — R42 Dizziness and giddiness: Secondary | ICD-10-CM | POA: Insufficient documentation

## 2013-11-06 DIAGNOSIS — J449 Chronic obstructive pulmonary disease, unspecified: Secondary | ICD-10-CM

## 2013-11-06 DIAGNOSIS — D75839 Thrombocytosis, unspecified: Secondary | ICD-10-CM

## 2013-11-06 DIAGNOSIS — R5383 Other fatigue: Secondary | ICD-10-CM | POA: Diagnosis not present

## 2013-11-06 DIAGNOSIS — G589 Mononeuropathy, unspecified: Secondary | ICD-10-CM | POA: Insufficient documentation

## 2013-11-06 DIAGNOSIS — D500 Iron deficiency anemia secondary to blood loss (chronic): Secondary | ICD-10-CM | POA: Diagnosis present

## 2013-11-06 DIAGNOSIS — R0602 Shortness of breath: Secondary | ICD-10-CM | POA: Diagnosis not present

## 2013-11-06 DIAGNOSIS — I10 Essential (primary) hypertension: Secondary | ICD-10-CM | POA: Diagnosis not present

## 2013-11-06 DIAGNOSIS — D72829 Elevated white blood cell count, unspecified: Secondary | ICD-10-CM

## 2013-11-06 DIAGNOSIS — M5137 Other intervertebral disc degeneration, lumbosacral region: Secondary | ICD-10-CM | POA: Diagnosis not present

## 2013-11-06 DIAGNOSIS — Z79899 Other long term (current) drug therapy: Secondary | ICD-10-CM | POA: Insufficient documentation

## 2013-11-06 DIAGNOSIS — M549 Dorsalgia, unspecified: Secondary | ICD-10-CM

## 2013-11-06 DIAGNOSIS — F411 Generalized anxiety disorder: Secondary | ICD-10-CM | POA: Diagnosis not present

## 2013-11-06 DIAGNOSIS — F329 Major depressive disorder, single episode, unspecified: Secondary | ICD-10-CM | POA: Diagnosis not present

## 2013-11-06 DIAGNOSIS — D72828 Other elevated white blood cell count: Secondary | ICD-10-CM | POA: Diagnosis not present

## 2013-11-06 DIAGNOSIS — R350 Frequency of micturition: Secondary | ICD-10-CM

## 2013-11-06 LAB — COMPREHENSIVE METABOLIC PANEL
ALK PHOS: 210 U/L — AB (ref 39–117)
ALT: 41 U/L — ABNORMAL HIGH (ref 0–35)
AST: 62 U/L — ABNORMAL HIGH (ref 0–37)
Albumin: 3.6 g/dL (ref 3.5–5.2)
BUN: 7 mg/dL (ref 6–23)
CO2: 25 mEq/L (ref 19–32)
Calcium: 9.2 mg/dL (ref 8.4–10.5)
Chloride: 99 mEq/L (ref 96–112)
Creatinine, Ser: 0.54 mg/dL (ref 0.50–1.10)
GFR calc non Af Amer: >90 mL/min (ref >90)
GLUCOSE: 106 mg/dL — AB (ref 70–99)
POTASSIUM: 3.4 meq/L — AB (ref 3.7–5.3)
Sodium: 138 mEq/L (ref 137–147)
TOTAL PROTEIN: 7.4 g/dL (ref 6.0–8.3)
Total Bilirubin: 0.5 mg/dL (ref 0.3–1.2)

## 2013-11-06 LAB — CBC WITH DIFFERENTIAL/PLATELET
BASOS ABS: 0.1 10*3/uL (ref 0.0–0.1)
BASOS PCT: 1 % (ref 0–1)
EOS ABS: 0.4 10*3/uL (ref 0.0–0.7)
EOS PCT: 4 % (ref 0–5)
HCT: 39.8 % (ref 36.0–46.0)
Hemoglobin: 13.5 g/dL (ref 12.0–15.0)
LYMPHS ABS: 5 10*3/uL — AB (ref 0.7–4.0)
Lymphocytes Relative: 42 % (ref 12–46)
MCH: 30.5 pg (ref 26.0–34.0)
MCHC: 33.9 g/dL (ref 30.0–36.0)
MCV: 89.8 fL (ref 78.0–100.0)
Monocytes Absolute: 0.6 10*3/uL (ref 0.1–1.0)
Monocytes Relative: 5 % (ref 3–12)
Neutro Abs: 5.9 10*3/uL (ref 1.7–7.7)
Neutrophils Relative %: 48 % (ref 43–77)
PLATELETS: 388 10*3/uL (ref 150–400)
RBC: 4.43 MIL/uL (ref 3.87–5.11)
RDW: 13.8 % (ref 11.5–15.5)
WBC: 12 10*3/uL — ABNORMAL HIGH (ref 4.0–10.5)

## 2013-11-06 LAB — RETICULOCYTES
RBC.: 4.43 MIL/uL (ref 3.87–5.11)
RETIC CT PCT: 1.5 % (ref 0.4–3.1)
Retic Count, Absolute: 66.5 10*3/uL (ref 19.0–186.0)

## 2013-11-06 LAB — FERRITIN: Ferritin: 376 ng/mL — ABNORMAL HIGH (ref 10–291)

## 2013-11-06 LAB — LACTATE DEHYDROGENASE: LDH: 196 U/L (ref 94–250)

## 2013-11-06 NOTE — Progress Notes (Signed)
LABS DRAWN FOR BCR/ABL GENE REARRANGEMENT BY PCR, JAK2 GENOTYPR, FERR, CMP, LDH, CBCD, RETICULOCYTES

## 2013-11-06 NOTE — Patient Instructions (Signed)
[facility] Discharge Instructions  RECOMMENDATIONS MADE BY THE CONSULTANT AND ANY TEST RESULTS WILL BE SENT TO YOUR REFERRING PHYSICIAN.  Return in 6 months for office visit. Please consult your primary care physician re:  Urinary symptoms; chest pain  Thank you for choosing [facility] to provide your oncology and hematology care.  To afford each patient quality time with our providers, please arrive at least 15 minutes before your scheduled appointment time.  With your help, our goal is to use those 15 minutes to complete the necessary work-up to ensure our physicians have the information they need to help with your evaluation and healthcare recommendations.    Effective January 1st, 2014, we ask that you re-schedule your appointment with our physicians should you arrive 10 or more minutes late for your appointment.  We strive to give you quality time with our providers, and arriving late affects you and other patients whose appointments are after yours.    Again, thank you for choosing [facility].  Our hope is that these requests will decrease the amount of time that you wait before being seen by our physicians.       _____________________________________________________________  Should you have questions after your visit to [facility], please contact our office at [phone] between the hours of 8:30 a.m. and 5:00 p.m.  Voicemails left after 4:30 p.m. will not be returned until the following business day.  For prescription refill requests, have your pharmacy contact our office with your prescription refill request.

## 2013-11-06 NOTE — Progress Notes (Signed)
[facility]  OFFICE PROGRESS NOTE  MANN, [place], PA-C [address] [place] [address]  DIAGNOSIS: Iron deficiency anemia secondary to blood loss (chronic) - Plan: CBC with Differential, Reticulocytes, Ferritin  Thrombocytosis - Plan: CBC with Differential, Reticulocytes, Comprehensive metabolic panel, Lactate dehydrogenase, Bcr/abl gene rearrangement qnt, PCR, JAK2 genotypr, Ferritin  COPD (chronic obstructive pulmonary disease)  Leukocytosis - Plan: CBC with Differential, Reticulocytes, Comprehensive metabolic panel, Lactate dehydrogenase, Bcr/abl gene rearrangement qnt, PCR, JAK2 genotypr, Ferritin  Chief Complaint  Patient presents with  . Follow-up  . Anemia  . Leukocytosis/thrombocytosis    CURRENT THERAPY: Watchful expectation and surveillance.  INTERVAL HISTORY: [name] 53 y.o. female returns for followup of iron deficiency in the setting of thrombocytosis and leukocytosis with last visit here in September of 2014 outpatient CBC done on 10/21/2013 showed a white cell count of 16.5, hemoglobin of 14.5, and platelets of  427,000. Unable to find work up for primary bone marrow disorder so additional testing will be done today to identify myeloproliferative disorder. She presents with multiple somatic complaints including left upper quadrant abdominal pain, back pain, urinary frequency, weakness, fatigue, intermittent chest pain for 3 weeks, dizziness, lightheadedness, and shortness of breath on exertion. She denies any significant pruritus or desire to suck on ice.  MEDICAL HISTORY: Past Medical History  Diagnosis Date  . Hypertension   . Depression   . Anxiety   . Disc degeneration, lumbar   . Leukocytosis   . Thrombocytosis   . Lumbar disc disease     INTERIM HISTORY: has CARPAL TUNNEL SYNDROME; MONONEURITIS, LEG; HEMATEMESIS; HEMATOCHEZIA; Nausea with vomiting; Diarrhea; ABDOMINAL PAIN; TRANSAMINASES,  SERUM, ELEVATED; COLONIC POLYPS, HX OF; FATTY LIVER DISEASE, HX OF; Leukocytosis; Thrombocytosis; Iron deficiency anemia secondary to blood loss (chronic); and Folic acid deficiency on her problem list.    ALLERGIES:  is allergic to codeine; latex; and other.  MEDICATIONS: has a current medication list which includes the following prescription(s): alprazolam, baclofen, duloxetine, estrogens (conjugated), fentanyl, fluconazole, metoclopramide, metoprolol tartrate, omeprazole, oxycodone, promethazine, tizanidine, zolpidem, folic acid, and potassium chloride sa.  SURGICAL HISTORY:  Past Surgical History  Procedure Laterality Date  . Total abdominal hysterectomy w/ bilateral salpingoophorectomy      age 30  . Rt breast biopsy    . Right knee surgery    . Esophagogastroduodenoscopy  08/08/2005    AYT:KZSWFU esophagogastroduodenoscopy.  . Colonoscopy  02/21/2007    XNA:TFTD papilla and internal hemorrhoids, diminutive rectal polyp/Left-sided diverticula (sigmoid) pedunculated polyps mid descending    FAMILY HISTORY: family history includes Cancer in her brother, maternal aunt, maternal uncle, mother, paternal aunt, and paternal grandfather. She was adopted.  SOCIAL HISTORY:  reports that she has been smoking Cigarettes.  She has a 15 pack-year smoking history. She has never used smokeless tobacco. She reports that she does not drink alcohol or use illicit drugs.  REVIEW OF SYSTEMS:  Other than that discussed above is noncontributory.  PHYSICAL EXAMINATION: ECOG PERFORMANCE STATUS: 1 - Symptomatic but completely ambulatory  Blood pressure 144/91, pulse 116, temperature 97.9 F (36.6 C), temperature source Oral, resp. rate 20, weight 170 lb 8 oz (77.338 kg).  GENERAL:alert, no distress and comfortable SKIN: skin color, texture, turgor are normal, no rashes or significant lesions EYES: PERLA; Conjunctiva are pink and non-injected, sclera clear SINUSES: No redness or tenderness over maxillary  or ethmoid sinuses OROPHARYNX:no exudate, no erythema on lips, buccal mucosa, or tongue. NECK: supple, thyroid normal  size, non-tender, without nodularity. No masses CHEST: Increased AP diameter with no breast masses. LYMPH:  no palpable lymphadenopathy in the cervical, axillary or inguinal LUNGS: clear to auscultation and percussion with normal breathing effort HEART: regular rate & rhythm and no murmurs. ABDOMEN:abdomen soft, non-tender and normal bowel sounds. Liver spleen on large. MUSCULOSKELETAL:no cyanosis of digits and no clubbing. Range of motion normal.  NEURO: alert & oriented x 3 with fluent speech, no focal motor/sensory deficits   LABORATORY DATA:  10/21/2013:  WBC 16.5, hemoglobin 14.5, platelets 477,000, ANC 7.9, ALC 6.8                    Alkaline phosphatase 186 with AST 49 ALT 43  No visits with results within 30 Day(s) from this visit. Latest known visit with results is:  Office Visit on 02/23/2013  Component Date Value Ref Range Status  . WBC 02/23/2013 14.7* 4.0 - 10.5 K/uL Final  . RBC 02/23/2013 4.67  3.87 - 5.11 MIL/uL Final  . Hemoglobin 02/23/2013 14.2  12.0 - 15.0 g/dL Final  . HCT 02/23/2013 41.6  36.0 - 46.0 % Final  . MCV 02/23/2013 89.1  78.0 - 100.0 fL Final  . MCH 02/23/2013 30.4  26.0 - 34.0 pg Final  . MCHC 02/23/2013 34.1  30.0 - 36.0 g/dL Final  . RDW 02/23/2013 13.7  11.5 - 15.5 % Final  . Platelets 02/23/2013 468* 150 - 400 K/uL Final  . Neutrophils Relative % 02/23/2013 48  43 - 77 % Final  . Lymphocytes Relative 02/23/2013 43  12 - 46 % Final  . Monocytes Relative 02/23/2013 5  3 - 12 % Final  . Eosinophils Relative 02/23/2013 3  0 - 5 % Final  . Basophils Relative 02/23/2013 1  0 - 1 % Final  . Neutro Abs 02/23/2013 7.2  1.7 - 7.7 K/uL Final  . Lymphs Abs 02/23/2013 6.3* 0.7 - 4.0 K/uL Final  . Monocytes Absolute 02/23/2013 0.7  0.1 - 1.0 K/uL Final  . Eosinophils Absolute 02/23/2013 0.4  0.0 - 0.7 K/uL Final  . Basophils Absolute  02/23/2013 0.1  0.0 - 0.1 K/uL Final  . WBC Morphology 02/23/2013 ABSOLUTE LYMPHOCYTOSIS   Final   ATYPICAL LYMPHOCYTES  . Smear Review 02/23/2013 LARGE PLATELETS PRESENT   Final   GIANT PLATELETS SEEN  . Ferritin 02/23/2013 362* 10 - 291 ng/mL Final   Performed at [facility]  . Iron 02/23/2013 131  42 - 135 ug/dL Final  . TIBC 02/23/2013 384  250 - 470 ug/dL Final  . Saturation Ratios 02/23/2013 34  20 - 55 % Final  . UIBC 02/23/2013 253  125 - 400 ug/dL Final   Performed at [facility]  . Erythropoietin 02/23/2013 5.6  2.6 - 18.5 mIU/mL Final   Comment: (NOTE)                          Because of diurnal variations in Erythropoietin levels, it is                          important to collect samples at a consistent time of day.  Morning                          samples collected between 7:30 AM and 12:00 noon are recommended.  Performed at [facility]: Peripheral blood smear failed to reveal evidence of premature forms.  Urinalysis    Component Value Date/Time   COLORURINE YELLOW 04/04/2009 2232   APPEARANCEUR CLEAR 04/04/2009 2232   LABSPEC 1.005 04/04/2009 2232   PHURINE 6.5 04/04/2009 2232   GLUCOSEU NEGATIVE 04/04/2009 2232   HGBUR NEGATIVE 04/04/2009 2232   BILIRUBINUR NEGATIVE 04/04/2009 2232   KETONESUR NEGATIVE 04/04/2009 2232   PROTEINUR NEGATIVE 04/04/2009 2232   UROBILINOGEN 0.2 04/04/2009 2232   NITRITE NEGATIVE 04/04/2009 2232   LEUKOCYTESUR NEGATIVE MICROSCOPIC NOT DONE ON URINES WITH NEGATIVE PROTEIN, BLOOD, LEUKOCYTES, NITRITE, OR GLUCOSE <1000 mg/dL. 04/04/2009 2232    RADIOGRAPHIC STUDIES: No results found.  ASSESSMENT:  #1. Possible myeloproliferative disorder. #2. History of iron deficiency anemia with Feraheme infusion given in March of 2014, awaiting today's ferritin report. #3. Steatosis of the liver. #4. Multiple vertebral injuries with generalized tremulousness and back pain control with  fentanyl plus oxycodone.   PLAN:  #1. If ferritin is low, intravenous iron will be administered. #2. Await results of BCR-ABL and JAK-2 determination with appropriate intervention should abnormalities be discovered. #3. Office visit 6 months with CBC and ferritin.   All questions were answered. The patient knows to call the clinic with any problems, questions or concerns. We can certainly see the patient much sooner if necessary.   I spent 25 minutes counseling the patient face to face. The total time spent in the appointment was 30 minutes.    Farrel Gobble, MD 11/06/2013 9:48 AM  DISCLAIMER:  This note was dictated with voice recognition software.  Similar sounding words can inadvertently be transcribed inaccurately and may not be corrected upon review.

## 2013-11-10 LAB — P210 BCR-ABL 1: P210 BCR ABL1: NOT DETECTED

## 2013-11-10 LAB — BCR/ABL GENE REARRANGEMENT QNT, PCR
BCR ABL1 / ABL1 IS: 0 %
BCR ABL1 / ABL1: 0 %

## 2013-11-10 LAB — P190 BCR-ABL 1: P190 BCR ABL1: NOT DETECTED

## 2013-11-11 LAB — JAK2 GENOTYPR: JAK2 GenotypR: NOT DETECTED

## 2013-11-16 ENCOUNTER — Encounter: Payer: Self-pay | Admitting: *Deleted

## 2013-12-09 ENCOUNTER — Emergency Department (HOSPITAL_COMMUNITY)
Admission: EM | Admit: 2013-12-09 | Discharge: 2013-12-09 | Disposition: A | Discharge status: Home / Self Care | Payer: Medicare HMO | Attending: Emergency Medicine | Admitting: Emergency Medicine

## 2013-12-09 ENCOUNTER — Telehealth: Payer: Self-pay | Admitting: *Deleted

## 2013-12-09 ENCOUNTER — Emergency Department (HOSPITAL_COMMUNITY): Payer: Medicare HMO

## 2013-12-09 ENCOUNTER — Encounter (HOSPITAL_COMMUNITY): Payer: Self-pay | Admitting: Emergency Medicine

## 2013-12-09 DIAGNOSIS — R000 Tachycardia, unspecified: Secondary | ICD-10-CM | POA: Insufficient documentation

## 2013-12-09 DIAGNOSIS — F3289 Other specified depressive episodes: Secondary | ICD-10-CM | POA: Insufficient documentation

## 2013-12-09 DIAGNOSIS — R112 Nausea with vomiting, unspecified: Secondary | ICD-10-CM

## 2013-12-09 DIAGNOSIS — M51379 Other intervertebral disc degeneration, lumbosacral region without mention of lumbar back pain or lower extremity pain: Secondary | ICD-10-CM | POA: Insufficient documentation

## 2013-12-09 DIAGNOSIS — M5137 Other intervertebral disc degeneration, lumbosacral region: Secondary | ICD-10-CM | POA: Insufficient documentation

## 2013-12-09 DIAGNOSIS — E860 Dehydration: Secondary | ICD-10-CM

## 2013-12-09 DIAGNOSIS — I10 Essential (primary) hypertension: Secondary | ICD-10-CM | POA: Insufficient documentation

## 2013-12-09 DIAGNOSIS — R109 Unspecified abdominal pain: Secondary | ICD-10-CM | POA: Insufficient documentation

## 2013-12-09 DIAGNOSIS — F411 Generalized anxiety disorder: Secondary | ICD-10-CM | POA: Insufficient documentation

## 2013-12-09 DIAGNOSIS — R197 Diarrhea, unspecified: Secondary | ICD-10-CM | POA: Insufficient documentation

## 2013-12-09 DIAGNOSIS — Z91040 Latex allergy status: Secondary | ICD-10-CM | POA: Insufficient documentation

## 2013-12-09 DIAGNOSIS — F329 Major depressive disorder, single episode, unspecified: Secondary | ICD-10-CM | POA: Insufficient documentation

## 2013-12-09 DIAGNOSIS — C9590 Leukemia, unspecified not having achieved remission: Secondary | ICD-10-CM | POA: Insufficient documentation

## 2013-12-09 DIAGNOSIS — Z860100 Personal history of colon polyps, unspecified: Secondary | ICD-10-CM | POA: Insufficient documentation

## 2013-12-09 DIAGNOSIS — F17200 Nicotine dependence, unspecified, uncomplicated: Secondary | ICD-10-CM | POA: Insufficient documentation

## 2013-12-09 DIAGNOSIS — Z79899 Other long term (current) drug therapy: Secondary | ICD-10-CM | POA: Insufficient documentation

## 2013-12-09 LAB — COMPREHENSIVE METABOLIC PANEL
ALBUMIN: 4.2 g/dL (ref 3.5–5.2)
ALK PHOS: 229 U/L — AB (ref 39–117)
ALT: 23 U/L (ref 0–35)
ANION GAP: 19 — AB (ref 5–15)
AST: 53 U/L — ABNORMAL HIGH (ref 0–37)
BUN: 5 mg/dL — ABNORMAL LOW (ref 6–23)
CO2: 22 mEq/L (ref 19–32)
CREATININE: 0.51 mg/dL (ref 0.50–1.10)
Calcium: 9.7 mg/dL (ref 8.4–10.5)
Chloride: 101 mEq/L (ref 96–112)
GFR calc Af Amer: >90 mL/min (ref >90)
GFR calc non Af Amer: >90 mL/min (ref >90)
Glucose, Bld: 117 mg/dL — ABNORMAL HIGH (ref 70–99)
POTASSIUM: 3.5 meq/L — AB (ref 3.7–5.3)
Sodium: 142 mEq/L (ref 137–147)
Total Bilirubin: 0.6 mg/dL (ref 0.3–1.2)
Total Protein: 8.6 g/dL — ABNORMAL HIGH (ref 6.0–8.3)

## 2013-12-09 LAB — CBC WITH DIFFERENTIAL/PLATELET
Basophils Absolute: 0.1 K/uL (ref 0.0–0.1)
Basophils Relative: 1 % (ref 0–1)
Eosinophils Absolute: 0.3 K/uL (ref 0.0–0.7)
Eosinophils Relative: 2 % (ref 0–5)
HCT: 47.1 % — ABNORMAL HIGH (ref 36.0–46.0)
Hemoglobin: 16.3 g/dL — ABNORMAL HIGH (ref 12.0–15.0)
Lymphocytes Relative: 30 % (ref 12–46)
Lymphs Abs: 4.5 K/uL — ABNORMAL HIGH (ref 0.7–4.0)
MCH: 30.9 pg (ref 26.0–34.0)
MCHC: 34.6 g/dL (ref 30.0–36.0)
MCV: 89.2 fL (ref 78.0–100.0)
Monocytes Absolute: 0.9 K/uL (ref 0.1–1.0)
Monocytes Relative: 6 % (ref 3–12)
Neutro Abs: 9.4 K/uL — ABNORMAL HIGH (ref 1.7–7.7)
Neutrophils Relative %: 61 % (ref 43–77)
Platelets: 493 K/uL — ABNORMAL HIGH (ref 150–400)
RBC: 5.28 MIL/uL — ABNORMAL HIGH (ref 3.87–5.11)
RDW: 13.9 % (ref 11.5–15.5)
WBC: 15.2 K/uL — ABNORMAL HIGH (ref 4.0–10.5)

## 2013-12-09 LAB — LIPASE, BLOOD: Lipase: 21 U/L (ref 11–59)

## 2013-12-09 MED ORDER — SODIUM CHLORIDE 0.9 % IV SOLN
1000.0000 mL | Freq: Once | INTRAVENOUS | Status: AC
  Administered 2013-12-09: 1000 mL via INTRAVENOUS

## 2013-12-09 MED ORDER — DIPHENHYDRAMINE HCL 50 MG/ML IJ SOLN
25.0000 mg | Freq: Once | INTRAMUSCULAR | Status: AC
  Administered 2013-12-09: 25 mg via INTRAVENOUS
  Filled 2013-12-09: qty 1

## 2013-12-09 MED ORDER — SODIUM CHLORIDE 0.9 % IV SOLN: 1000.0000 mL | INTRAVENOUS | Status: DC

## 2013-12-09 MED ORDER — METOCLOPRAMIDE HCL 5 MG/ML IJ SOLN
10.0000 mg | Freq: Once | INTRAMUSCULAR | Status: AC
  Administered 2013-12-09: 10 mg via INTRAVENOUS
  Filled 2013-12-09: qty 2

## 2013-12-09 MED ORDER — ONDANSETRON 4 MG PO TBDP: 4.0000 mg | ORAL_TABLET | Freq: Once | ORAL | Status: DC

## 2013-12-09 MED ORDER — FENTANYL CITRATE 0.05 MG/ML IJ SOLN
50.0000 ug | Freq: Once | INTRAMUSCULAR | Status: AC
  Administered 2013-12-09: 50 ug via INTRAVENOUS
  Filled 2013-12-09: qty 2

## 2013-12-09 MED ORDER — SODIUM CHLORIDE 0.9 % IV BOLUS (SEPSIS): 1000.0000 mL | Freq: Once | INTRAVENOUS | Status: DC

## 2013-12-09 MED ORDER — PROMETHAZINE HCL 25 MG PO TABS: 25.0000 mg | ORAL_TABLET | Freq: Three times a day (TID) | ORAL | Status: DC | PRN

## 2013-12-09 MED ORDER — IOHEXOL 300 MG/ML  SOLN
100.0000 mL | Freq: Once | INTRAMUSCULAR | Status: AC | PRN
  Administered 2013-12-09: 100 mL via INTRAVENOUS

## 2013-12-09 MED ORDER — ONDANSETRON HCL 4 MG/2ML IJ SOLN
4.0000 mg | Freq: Once | INTRAMUSCULAR | Status: AC
  Administered 2013-12-09: 4 mg via INTRAVENOUS
  Filled 2013-12-09: qty 2

## 2013-12-09 NOTE — ED Notes (Signed)
MD at bedside. 

## 2013-12-09 NOTE — Telephone Encounter (Signed)
I spoke with pt. We have not seen her in the office since 2011, she no showed her last ov with AS in 2014. Advised her that until she has been seen in the office recently that I cannot give her medical advise. Informed her that she needed to see her pcp or go to the ED if she gets worse. Pt verbalized understanding and is aware of ov on 12/21/13 with AS.

## 2013-12-09 NOTE — ED Notes (Signed)
Pt also reports has had diarrhea as well.

## 2013-12-09 NOTE — ED Notes (Signed)
Pt tolerated Sprite without nausea. Pt walking in halls without complaint of pain.

## 2013-12-09 NOTE — ED Notes (Signed)
Pt also reports is being evaluated for elevated liver enzymes.

## 2013-12-09 NOTE — Telephone Encounter (Signed)
Pt has a appt 12/21/13 and pt says she is very bloated, pt has been in the bed for the last 12 days, anything she eats it goes right through her and she has lost weight. Per pt she has nausea and diarrhea. Pt said even water makes her sick. Pt said she is having a lot of pain. Please advise [phone]

## 2013-12-09 NOTE — Discharge Instructions (Signed)
Drink plenty of fluids. Use the phenergan for nausea or vomiting. Avoid milk until the diarrhea is gone.  Dehydration, Adult Dehydration is when you lose more fluids from the body than you take in. Vital organs like the kidneys, brain, and heart cannot function without a proper amount of fluids and salt. Any loss of fluids from the body can cause dehydration.  CAUSES   Vomiting.  Diarrhea.  Excessive sweating.  Excessive urine output.  Fever. SYMPTOMS  Mild dehydration  Thirst.  Dry lips.  Slightly dry mouth. Moderate dehydration  Very dry mouth.  Sunken eyes.  Skin does not bounce back quickly when lightly pinched and released.  Dark urine and decreased urine production.  Decreased tear production.  Headache. Severe dehydration  Very dry mouth.  Extreme thirst.  Rapid, weak pulse (more than 100 beats per minute at rest).  Cold hands and feet.  Not able to sweat in spite of heat and temperature.  Rapid breathing.  Blue lips.  Confusion and lethargy.  Difficulty being awakened.  Minimal urine production.  No tears. DIAGNOSIS  Your caregiver will diagnose dehydration based on your symptoms and your exam. Blood and urine tests will help confirm the diagnosis. The diagnostic evaluation should also identify the cause of dehydration. TREATMENT  Treatment of mild or moderate dehydration can often be done at home by increasing the amount of fluids that you drink. It is best to drink small amounts of fluid more often. Drinking too much at one time can make vomiting worse. Refer to the home care instructions below. Severe dehydration needs to be treated at the hospital where you will probably be given intravenous (IV) fluids that contain water and electrolytes. HOME CARE INSTRUCTIONS   Ask your caregiver about specific rehydration instructions.  Drink enough fluids to keep your urine clear or pale yellow.  Drink small amounts frequently if you have nausea  and vomiting.  Eat as you normally do.  Avoid:  Foods or drinks high in sugar.  Carbonated drinks.  Juice.  Extremely hot or cold fluids.  Drinks with caffeine.  Fatty, greasy foods.  Alcohol.  Tobacco.  Overeating.  Gelatin desserts.  Wash your hands well to avoid spreading bacteria and viruses.  Only take over-the-counter or prescription medicines for pain, discomfort, or fever as directed by your caregiver.  Ask your caregiver if you should continue all prescribed and over-the-counter medicines.  Keep all follow-up appointments with your caregiver. SEEK MEDICAL CARE IF:  You have abdominal pain and it increases or stays in one area (localizes).  You have a rash, stiff neck, or severe headache.  You are irritable, sleepy, or difficult to awaken.  You are weak, dizzy, or extremely thirsty. SEEK IMMEDIATE MEDICAL CARE IF:   You are unable to keep fluids down or you get worse despite treatment.  You have frequent episodes of vomiting or diarrhea.  You have blood or green matter (bile) in your vomit.  You have blood in your stool or your stool looks black and tarry.  You have not urinated in 6 to 8 hours, or you have only urinated a small amount of very dark urine.  You have a fever.  You faint. MAKE SURE YOU:   Understand these instructions.  Will watch your condition.  Will get help right away if you are not doing well or get worse. Document Released: 05/21/2005 Document Revised: 08/13/2011 Document Reviewed: 01/08/2011 [facility] Patient Information 2015 [place], [place]. This information is not intended to replace advice given  to you by your health care provider. Make sure you discuss any questions you have with your health care provider. Keep your appointment with Dr Gala Romney to have the endoscopy done, the radiologist also recommended you get a colonoscopy done to look at one area of your colon. Recheck if you get dehydrated again.

## 2013-12-09 NOTE — Telephone Encounter (Signed)
Pt has a appt 12/21/13 and pt says she is very bloated, pt has been in the bed for the last 12 days, anything she eats it goes right through her and she has lost weight. Per pt she has nausea and diarrhea. Pt said even water makes her sick. Pt said she is having a lot of pain. Please advise [phone]

## 2013-12-09 NOTE — Telephone Encounter (Signed)
Agreed -

## 2013-12-09 NOTE — ED Notes (Signed)
Pt came to desk to complain that she is not willing to wait any longer for her discharge papers and turned around and walked out of the e.d.

## 2013-12-09 NOTE — ED Provider Notes (Signed)
CSN: [id number]     Arrival date & time 12/09/13  1638 History  This chart was scribed for Janice Norrie, MD by Roxan Diesel, ED scribe.  This patient was seen in room APA17/APA17 and the patient's care was started at 5:34 PM.   Chief Complaint  Patient presents with  . Abdominal Pain    The history is provided by the patient. No language interpreter was used.    HPI Comments: [name] is a 53 y.o. female with h/o leukemia, fatty liver disease, colonic polyps, elevated serum transaminases, leukocytosis, and thrombocytosis who presents to the Emergency Department complaining of 13 days of abdominal pain, nausea, vomiting and diarrhea.  Pt states her symptoms initially began after eating a barbecue sandwich.  Her father ate the same sandwich and did not get sick.  She has been unable to eat or drink anything since then including her medications.  She reports about 6-9 episodes of emesis per day, and about 10 episodes per day of watery stool.  She was seen by her PCP today and told "you're throwing up bile" and advised to come to the ED.  She also reports sharp pains that begin in her right upper abdomen and radiates across the abdomen to her RLQ.  This pain has also been constant for 13 days.  She also reports lightheadedness and dizziness over the past few days.  She states she is urinating a very large amount considering how few fluids she is drinking.  She denies fever.  Pt denies prior h/o similar symptoms.  She denise recent sick contact.  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  She still sees her oncologist and was told that her white count is still abnormal.  She is also scheduled for an endoscopy soon due to recent questionable elevated liver tests. .  She takes Xanax, Cymbalta, Baclofen, Premarin, Reglan, lopressor, Prilosec, Roxicodone, Zanaflex, and Ambien.  She is wearing a fentanyl patch.  She has h/o  hysterectomy.    Past Medical History  Diagnosis Date  . Hypertension   . Depression   . Anxiety   . Disc degeneration, lumbar   . Leukocytosis   . Thrombocytosis   . Lumbar disc disease     Past Surgical History  Procedure Laterality Date  . Total abdominal hysterectomy w/ bilateral salpingoophorectomy      age 14  . Rt breast biopsy    . Right knee surgery    . Esophagogastroduodenoscopy  08/08/2005    [id number] esophagogastroduodenoscopy.  . Colonoscopy  02/21/2007    PJK:DTOI papilla and internal hemorrhoids, diminutive rectal polyp/Left-sided diverticula (sigmoid) pedunculated polyps mid descending    Family History  Problem Relation Age of Onset  . Adopted: Yes  . Cancer Mother   . Cancer Brother   . Cancer Maternal Aunt   . Cancer Maternal Uncle   . Cancer Paternal Aunt   . Cancer Paternal Grandfather     History  Substance Use Topics  . Smoking status: Current Every Day Smoker -- 0.50 packs/day for 30 years    Types: Cigarettes  . Smokeless tobacco: Never Used  . Alcohol Use: No     Comment: see HPI. Patient denies alcohol use at present   Pt is a current one-pack-per-day smoker.   She does not drink.   She is on disability due to back, hip and leg problems.   She lives with her  father  OB History   Grav Para Term Preterm Abortions TAB SAB Ect Mult Living                   Review of Systems  Gastrointestinal: Positive for nausea, vomiting, abdominal pain and diarrhea.  All other systems reviewed and are negative.     Allergies  Codeine; Latex; and Other  Home Medications   Prior to Admission medications   Medication Sig Start Date End Date Taking? Authorizing Provider  ALPRAZolam Duanne Moron) 1 MG tablet Take 1 mg by mouth 3 (three) times daily as needed. Pain    Yes Historical Provider, MD  baclofen (LIORESAL) 10 MG tablet Take 10 mg by mouth at bedtime. Muscle spasms   Yes Historical Provider, MD  DULoxetine (CYMBALTA) 60 MG capsule Take  60 mg by mouth daily.     Yes Historical Provider, MD  estradiol (ESTRACE) 1 MG tablet Take 1 tablet by mouth daily. 11/09/13  Yes Historical Provider, MD  estrogens, conjugated, (PREMARIN) 1.25 MG tablet Take 1.25 mg by mouth daily.     Yes Historical Provider, MD  fentaNYL (DURAGESIC - DOSED MCG/HR) 50 MCG/HR Place 1 patch onto the skin every 3 (three) days. 08/13/12  Yes Historical Provider, MD  metoCLOPramide (REGLAN) 5 MG tablet Take 5 mg by mouth at bedtime.     Yes Historical Provider, MD  metoprolol tartrate (LOPRESSOR) 25 MG tablet Take 25 mg by mouth daily.    Yes Historical Provider, MD  omeprazole (PRILOSEC) 20 MG capsule Take 20 mg by mouth at bedtime.    Yes Historical Provider, MD  oxycodone (ROXICODONE) 30 MG immediate release tablet Take 30 mg by mouth every 4 (four) hours as needed for pain.   Yes Historical Provider, MD  PROAIR HFA 108 (90 BASE) MCG/ACT inhaler Inhale 2 puffs into the lungs daily as needed. 09/07/13  Yes Historical Provider, MD  tiZANidine (ZANAFLEX) 4 MG tablet Take 1 tablet by mouth at bedtime as needed and may repeat dose one time if needed. 08/23/12  Yes Historical Provider, MD  zolpidem (AMBIEN) 10 MG tablet Take 10 mg by mouth at bedtime.     Yes Historical Provider, MD   BP 138/90  Pulse 122  Temp(Src) 98.4 F (36.9 C) (Oral)  Resp 20  Ht 5\' 6"  (1.676 m)  Wt 168 lb (76.204 kg)  BMI 27.13 kg/m2  SpO2 98%  Vital signs normal    Physical Exam  Nursing note and vitals reviewed. Constitutional: She is oriented to person, place, and time. She appears well-developed and well-nourished.  Non-toxic appearance. She does not appear ill. She appears distressed.  HENT:  Head: Normocephalic and atraumatic.  Right Ear: External ear normal.  Left Ear: External ear normal.  Nose: Nose normal. No mucosal edema or rhinorrhea.  Mouth/Throat: Oropharynx is clear and moist. Mucous membranes are dry. No dental abscesses or uvula swelling.  Eyes: Conjunctivae and EOM  are normal. Pupils are equal, round, and reactive to light.  Neck: Normal range of motion and full passive range of motion without pain. Neck supple.  Cardiovascular: Regular rhythm and normal heart sounds.  Tachycardia present.  Exam reveals no gallop and no friction rub.   No murmur heard. Pulmonary/Chest: Effort normal and breath sounds normal. No respiratory distress. She has no wheezes. She has no rhonchi. She has no rales. She exhibits no tenderness and no crepitus.  Abdominal: Soft. Normal appearance and bowel sounds are normal. She exhibits no distension. There is tenderness  in the right upper quadrant, right lower quadrant and suprapubic area. There is no rebound and no guarding.    Musculoskeletal: Normal range of motion. She exhibits no edema and no tenderness.  Moves all extremities well.   Neurological: She is alert and oriented to person, place, and time. She has normal strength. No cranial nerve deficit.  Skin: Skin is warm, dry and intact. No rash noted. No erythema. No pallor.  Psychiatric: Her speech is normal and behavior is normal. Her mood appears not anxious.  flat affect    ED Course  Procedures (including critical care time)  Medications  0.9 %  sodium chloride infusion (0 mLs Intravenous Stopped 12/09/13 1905)    Followed by  0.9 %  sodium chloride infusion (1,000 mLs Intravenous New Bag/Given 12/09/13 2009)    Followed by  0.9 %  sodium chloride infusion (not administered)  sodium chloride 0.9 % bolus 1,000 mL (not administered)  metoCLOPramide (REGLAN) injection 10 mg (10 mg Intravenous Given 12/09/13 1804)  diphenhydrAMINE (BENADRYL) injection 25 mg (25 mg Intravenous Given 12/09/13 1805)  fentaNYL (SUBLIMAZE) injection 50 mcg (50 mcg Intravenous Given 12/09/13 1804)  iohexol (OMNIPAQUE) 300 MG/ML solution 100 mL (100 mLs Intravenous Contrast Given 12/09/13 1914)  ondansetron (ZOFRAN) injection 4 mg (4 mg Intravenous Given 12/09/13 2009)    DIAGNOSTIC STUDIES: Oxygen  Saturation is 98% on room air, normal by my interpretation.    COORDINATION OF CARE: 5:45 PM-Discussed treatment plan which includes IV fluids, CT abdomen, and labs with pt at bedside and pt agreed to plan.   Pt given IV fluids and nausea meds. She was able to drink fluids without vomiting or diarrhea.    Results for orders placed during the hospital encounter of 12/09/13  COMPREHENSIVE METABOLIC PANEL      Result Value Ref Range   Sodium 142  137 - 147 mEq/L   Potassium 3.5 (*) 3.7 - 5.3 mEq/L   Chloride 101  96 - 112 mEq/L   CO2 22  19 - 32 mEq/L   Glucose, Bld 117 (*) 70 - 99 mg/dL   BUN 5 (*) 6 - 23 mg/dL   Creatinine, Ser 0.51  0.50 - 1.10 mg/dL   Calcium 9.7  8.4 - 10.5 mg/dL   Total Protein 8.6 (*) 6.0 - 8.3 g/dL   Albumin 4.2  3.5 - 5.2 g/dL   AST 53 (*) 0 - 37 U/L   ALT 23  0 - 35 U/L   Alkaline Phosphatase 229 (*) 39 - 117 U/L   Total Bilirubin 0.6  0.3 - 1.2 mg/dL   GFR calc non Af Amer >90  >90 mL/min   GFR calc Af Amer >90  >90 mL/min   Anion gap 19 (*) 5 - 15  CBC WITH DIFFERENTIAL      Result Value Ref Range   WBC 15.2 (*) 4.0 - 10.5 K/uL   RBC 5.28 (*) 3.87 - 5.11 MIL/uL   Hemoglobin 16.3 (*) 12.0 - 15.0 g/dL   HCT 47.1 (*) 36.0 - 46.0 %   MCV 89.2  78.0 - 100.0 fL   MCH 30.9  26.0 - 34.0 pg   MCHC 34.6  30.0 - 36.0 g/dL   RDW 13.9  11.5 - 15.5 %   Platelets 493 (*) 150 - 400 K/uL   Neutrophils Relative % 61  43 - 77 %   Neutro Abs 9.4 (*) 1.7 - 7.7 K/uL   Lymphocytes Relative 30  12 - 46 %  Lymphs Abs 4.5 (*) 0.7 - 4.0 K/uL   Monocytes Relative 6  3 - 12 %   Monocytes Absolute 0.9  0.1 - 1.0 K/uL   Eosinophils Relative 2  0 - 5 %   Eosinophils Absolute 0.3  0.0 - 0.7 K/uL   Basophils Relative 1  0 - 1 %   Basophils Absolute 0.1  0.0 - 0.1 K/uL  LIPASE, BLOOD      Result Value Ref Range   Lipase 21  11 - 59 U/L   Laboratory interpretation all normal except concentrated Hb c/w dehydration, elevated LFT's (chronic)   Ct Abdomen Pelvis W  Contrast  12/09/2013   CLINICAL DATA:  Abdominal pain.  Vomiting and diarrhea.  EXAM: CT ABDOMEN AND PELVIS WITH CONTRAST  TECHNIQUE: Multidetector CT imaging of the abdomen and pelvis was performed using the standard protocol following bolus administration of intravenous contrast.  CONTRAST:  189mL OMNIPAQUE IOHEXOL 300 MG/ML  SOLN  COMPARISON:  CT of the abdomen 09/22/2012.  FINDINGS: Lung Bases: Unremarkable.  Abdomen/Pelvis: Diffuse low attenuation throughout the hepatic parenchyma, compatible with hepatic steatosis. This is less severe than the prior study. The appearance of the gallbladder, pancreas, spleen, bilateral adrenal glands and bilateral kidneys is unremarkable. No significant volume of ascites. No pneumoperitoneum. No pathologic distention of small bowel. No lymphadenopathy identified within the abdomen or pelvis. Status post hysterectomy. Ovaries are not confidently identified may be surgically absent or atrophic. Urinary bladder is unremarkable in appearance. Focal area of narrowing and colonic wall thickening in the transverse colon best appreciated on image 51 of series 2 and image 38 of series 3.  Musculoskeletal: There are no aggressive appearing lytic or blastic lesions noted in the visualized portions of the skeleton.  IMPRESSION: 1. No acute findings in the abdomen or pelvis to account for the patient's symptoms. 2. However, there is a focal area of colonic wall thickening and narrowing in the transverse colon. Followup nonemergent colonoscopy is recommended in the near future to exclude the possibility of underlying colonic neoplasm. 3. Hepatic steatosis.   Electronically Signed   By: Vinnie Langton M.D.   On: 12/09/2013 20:00      MDM   Final diagnoses:  Nausea vomiting and diarrhea  Dehydration    New Prescriptions   PROMETHAZINE (PHENERGAN) 25 MG TABLET    Take 1 tablet (25 mg total) by mouth every 8 (eight) hours as needed for nausea or vomiting.    Plan  discharge  Rolland Porter, MD, FACEP   I personally performed the services described in this documentation, which was scribed in my presence. The recorded information has been reviewed and considered.  Rolland Porter, MD, Abram Sander    Janice Norrie, MD 12/09/13 2149

## 2013-12-09 NOTE — ED Notes (Signed)
Pt sent from [facility] for c/o abd pain and vomiting x 13 days.  Pt received 4mg  of zofran IM at pcp's office.

## 2013-12-10 NOTE — ED Notes (Addendum)
Pt was upset over the wait for her discharge paperwork, and walked out without it when it was actually ready for her.

## 2013-12-21 ENCOUNTER — Ambulatory Visit: Payer: Private Health Insurance - Indemnity | Admitting: Gastroenterology

## 2014-01-26 ENCOUNTER — Ambulatory Visit: Payer: Medicare HMO | Admitting: Gastroenterology

## 2014-01-26 ENCOUNTER — Telehealth: Payer: Self-pay | Admitting: Gastroenterology

## 2014-01-26 ENCOUNTER — Encounter: Payer: Self-pay | Admitting: Gastroenterology

## 2014-01-26 NOTE — Telephone Encounter (Signed)
Mailed letter °

## 2014-01-26 NOTE — Telephone Encounter (Signed)
Please send letter.

## 2014-01-26 NOTE — Telephone Encounter (Signed)
Pt was a no show

## 2014-03-09 ENCOUNTER — Other Ambulatory Visit: Payer: Self-pay

## 2014-03-09 ENCOUNTER — Encounter: Payer: Self-pay | Admitting: Gastroenterology

## 2014-03-09 ENCOUNTER — Ambulatory Visit (INDEPENDENT_AMBULATORY_CARE_PROVIDER_SITE_OTHER): Payer: Medicare HMO | Admitting: Gastroenterology

## 2014-03-09 VITALS — BP 100/66 | HR 89 | Temp 98.4°F | Ht 66.0 in | Wt 173.0 lb

## 2014-03-09 DIAGNOSIS — K5909 Other constipation: Secondary | ICD-10-CM

## 2014-03-09 DIAGNOSIS — Z800 Family history of malignant neoplasm of digestive organs: Secondary | ICD-10-CM | POA: Insufficient documentation

## 2014-03-09 DIAGNOSIS — Z86010 Personal history of colonic polyps: Secondary | ICD-10-CM

## 2014-03-09 DIAGNOSIS — R1314 Dysphagia, pharyngoesophageal phase: Secondary | ICD-10-CM

## 2014-03-09 DIAGNOSIS — K5900 Constipation, unspecified: Secondary | ICD-10-CM | POA: Insufficient documentation

## 2014-03-09 MED ORDER — PEG 3350-KCL-NA BICARB-NACL 420 G PO SOLR: 4000.0000 mL | ORAL | Status: DC

## 2014-03-09 MED ORDER — LINACLOTIDE 145 MCG PO CAPS: 145.0000 ug | ORAL_CAPSULE | Freq: Every day | ORAL | Status: DC

## 2014-03-09 NOTE — Progress Notes (Signed)
Primary Care Physician:  Collene Mares, PA-C Primary Gastroenterologist:  Dr. Gala Romney   Chief Complaint  [name] presents with  . Abdominal Pain    HPI:   53 year old female last seen in 2011 with history of chronic abdominal pain now presenting with RLQ abdominal pain. Has intermittent nausea, stays more nauseated than not. Unable to describe pain. "weird" pain. Constant. States had no bowel movement for "35 days". Had antibiotics and went normally, now back to no bowel movements. Has no "urge" to have a bowel movement. Abdominal bloating.  Occasional low-volume hematochezia. New onset constipation. Going on for 2-3 months. No melena.  Liquid dysphagia noted. Solid food dysphagia. History of dilation in the remote past per [name]. If does not take PPI, esophagus "burns" all the way up. Sometimes burns all the way up even with PPI. PCP gave her Reglan. Last EGD in 2011 with normal esophagus, gastritis. Negative H.pylori and negative celiac sprue.   Last colonoscopy in 2008 with adenomatous polyp; she was due for surveillance in 2013. CT July 2015 revealed focal area of colonic wall thickening and narrowing of transverse colon, recommending follow-up colonoscopy to exclude neoplasm.       Past Medical History  Diagnosis Date  . Hypertension   . Depression   . Anxiety   . Disc degeneration, lumbar   . Leukocytosis   . Thrombocytosis   . Lumbar disc disease   . GERD (gastroesophageal reflux disease)     Past Surgical History  Procedure Laterality Date  . Total abdominal hysterectomy w/ bilateral salpingoophorectomy      age 80  . Rt breast biopsy    . Right knee surgery    . Esophagogastroduodenoscopy  08/08/2005    [id number] esophagogastroduodenoscopy.  . Colonoscopy  02/21/2007    SWN:IOEV papilla and internal hemorrhoids, diminutive rectal polyp/Left-sided diverticula (sigmoid) pedunculated polyps mid descending  . Esophagogastroduodenoscopy  2011    Dr. Gala Romney:  normal esophagus, small hiatal hernia, duodenal diverticulum, negative H.pylori and celiac  . Colonoscopy  2008    Dr. Gala Romney: segmental biopsy negative for microscopic colitis. Three polyps, one tubular adenoma. Surveillance due 2013.     Current Outpatient Prescriptions  Medication Sig Dispense Refill  . ALPRAZolam (XANAX) 1 MG tablet Take 1 mg by mouth 3 (three) times daily as needed. Pain       . baclofen (LIORESAL) 10 MG tablet Take 10 mg by mouth 3 (three) times daily. Muscle spasms      . DULoxetine (CYMBALTA) 60 MG capsule Take 60 mg by mouth daily.        Marland Kitchen estradiol (ESTRACE) 1 MG tablet Take 1 tablet by mouth daily.      . fentaNYL (DURAGESIC - DOSED MCG/HR) 50 MCG/HR Place 1 patch onto the skin every 3 (three) days.      . metoCLOPramide (REGLAN) 5 MG tablet Take 5 mg by mouth 4 (four) times daily -  before meals and at bedtime.       . metoprolol tartrate (LOPRESSOR) 25 MG tablet Take 25 mg by mouth daily.       Marland Kitchen omeprazole (PRILOSEC) 20 MG capsule Take 20 mg by mouth at bedtime.       Marland Kitchen oxycodone (ROXICODONE) 30 MG immediate release tablet Take 30 mg by mouth every 4 (four) hours as needed for pain.      Marland Kitchen PROAIR HFA 108 (90 BASE) MCG/ACT inhaler Inhale 2 puffs into the lungs daily as needed.      Marland Kitchen  promethazine (PHENERGAN) 25 MG tablet Take 1 tablet (25 mg total) by mouth every 8 (eight) hours as needed for nausea or vomiting.  15 tablet  0  . tiZANidine (ZANAFLEX) 4 MG tablet Take 1 tablet by mouth at bedtime as needed and may repeat dose one time if needed.      . zolpidem (AMBIEN) 10 MG tablet Take 10 mg by mouth at bedtime.        . Linaclotide (LINZESS) 145 MCG CAPS capsule Take 1 capsule (145 mcg total) by mouth daily. Take 30 minutes prior to breakfast  30 capsule  5  . polyethylene glycol-electrolytes (TRILYTE) 420 G solution Take 4,000 mLs by mouth as directed.  4000 mL  0   No current facility-administered medications for this visit.    Allergies as of 03/09/2014 -  Review Complete 03/09/2014  Allergen Reaction Noted  . Codeine Hives and Other (See Comments) 03/03/2008  . Latex Hives and Itching 02/23/2011  . Other Hives and Itching 02/23/2013    Family History  Problem Relation Age of Onset  . Adopted: Yes  . Cancer Mother   . Cancer Brother   . Cancer Maternal Aunt   . Cancer Maternal Uncle   . Cancer Paternal Aunt   . Cancer Paternal Grandfather   . Colon cancer Brother 17    History   Social History  . Marital Status: Single    Spouse Name: N/A    Number of Children: N/A  . Years of Education: N/A   Occupational History  . Not on file.   Social History Main Topics  . Smoking status: Current Every Day Smoker -- 1.00 packs/day for 30 years    Types: Cigarettes  . Smokeless tobacco: Never Used  . Alcohol Use: No     Comment: see HPI. [name] denies alcohol use at present  . Drug Use: No  . Sexual Activity: Not on file   Other Topics Concern  . Not on file   Social History Narrative  . No narrative on file    Review of Systems: Gen: Denies any fever, chills, fatigue, weight loss, lack of appetite.  CV: Denies chest pain, heart palpitations, peripheral edema, syncope.  Resp: +DOE GI: see HPI GU : Denies urinary burning, urinary frequency, urinary hesitancy MS: +chronic back pain Derm: Denies rash, itching, dry skin Psych: +depression/anxiety Heme: Denies bruising, bleeding, and enlarged lymph nodes.  Physical Exam: BP 100/66  Pulse 89  Temp(Src) 98.4 F (36.9 C) (Oral)  Ht 5\' 6"  (1.676 m)  Wt 173 lb (78.472 kg)  BMI 27.94 kg/m2 General:   Alert and oriented. Pleasant and cooperative. Well-nourished and well-developed.  Head:  Normocephalic and atraumatic. Eyes:  Without icterus, sclera clear and conjunctiva pink.  Ears:  Normal auditory acuity. Nose:  No deformity, discharge,  or lesions. Mouth:  No deformity or lesions, oral mucosa pink.  Lungs:  Clear to auscultation bilaterally. No wheezes, rales, or  rhonchi. No distress.  Heart:  S1, S2 present without murmurs appreciated.  Abdomen:  +BS, soft, mild TTP RLQ  and non-distended. No HSM noted. No guarding or rebound. No masses appreciated.  Rectal:  Deferred  Msk:  Right side weaker than left, walks with a limp Extremities:  Without clubbing or edema. Neurologic:  Alert and  oriented x4;  grossly normal neurologically. Skin:  Intact without significant lesions or rashes. Psych:  Alert and cooperative. Normal mood and affect.   CT abd/pelvis with contrast July 2015: IMPRESSION:  1. No  acute findings in the abdomen or pelvis to account for the  [name]'s symptoms.  2. However, there is a focal area of colonic wall thickening and  narrowing in the transverse colon. Followup nonemergent colonoscopy  is recommended in the near future to exclude the possibility of  underlying colonic neoplasm.  3. Hepatic steatosis.

## 2014-03-09 NOTE — Patient Instructions (Signed)
For constipation: start taking Linzess 1 capsule each morning, 30 minutes before breakfast. I have provided a voucher for you.  We have scheduled your for a colonoscopy, upper endoscopy and dilation with Dr. Gala Romney in the near future.

## 2014-03-09 NOTE — Progress Notes (Signed)
Pt pre-op appointment is on October 19 @ 9:00. Pt is aware

## 2014-03-10 ENCOUNTER — Encounter: Payer: Self-pay | Admitting: Gastroenterology

## 2014-03-10 DIAGNOSIS — R1314 Dysphagia, pharyngoesophageal phase: Secondary | ICD-10-CM | POA: Insufficient documentation

## 2014-03-10 NOTE — Assessment & Plan Note (Addendum)
Solid food and liquid dysphagia in the setting of GERD. Last EGD in 2011 as noted. Needs EGD for further evaluation.  Proceed with upper endoscopy and dilation in the near future with Dr. Gala Romney. The risks, benefits, and alternatives have been discussed in detail with patient. They have stated understanding and desire to proceed.  PROPOFOL due to polypharmacy Continue Prilosec daily

## 2014-03-10 NOTE — Assessment & Plan Note (Addendum)
53 year old female with history of adenomatous polyp in 2008, overdue for surveillance. Recent CT in July 2015 with focal area of colonic wall thickening and narrowing of transverse colon; needs colonoscopy to further exclude malignancy. +FH of colon cancer in brother at age 75.  Proceed with TCS with Dr. Gala Romney in near future: the risks, benefits, and alternatives have been discussed with the patient in detail. The patient states understanding and desires to proceed. Propofol due to polypharmacy

## 2014-03-10 NOTE — Assessment & Plan Note (Signed)
Brother age 52. High risk surveillance colonoscopy scheduled.

## 2014-03-10 NOTE — Assessment & Plan Note (Signed)
With constant abdominal pain, bloating, constipation. Likely multifactorial. Start Linzess 145 mcg daily. Vouchers and samples provided.

## 2014-03-11 NOTE — Progress Notes (Signed)
cc'ed to pcp °

## 2014-03-15 ENCOUNTER — Encounter (HOSPITAL_COMMUNITY): Payer: Self-pay | Admitting: Pharmacy Technician

## 2014-03-22 ENCOUNTER — Encounter (HOSPITAL_COMMUNITY)
Admission: RE | Admit: 2014-03-22 | Discharge: 2014-03-22 | Disposition: A | Discharge status: Home / Self Care | Payer: Medicare HMO | Source: Ambulatory Visit | Attending: Internal Medicine | Admitting: Internal Medicine

## 2014-03-22 ENCOUNTER — Encounter (HOSPITAL_COMMUNITY): Payer: Self-pay

## 2014-03-22 ENCOUNTER — Other Ambulatory Visit: Payer: Self-pay

## 2014-03-22 DIAGNOSIS — Z79899 Other long term (current) drug therapy: Secondary | ICD-10-CM | POA: Diagnosis not present

## 2014-03-22 DIAGNOSIS — K635 Polyp of colon: Secondary | ICD-10-CM | POA: Diagnosis not present

## 2014-03-22 DIAGNOSIS — K5730 Diverticulosis of large intestine without perforation or abscess without bleeding: Secondary | ICD-10-CM | POA: Diagnosis not present

## 2014-03-22 DIAGNOSIS — K921 Melena: Secondary | ICD-10-CM | POA: Diagnosis not present

## 2014-03-22 DIAGNOSIS — K5900 Constipation, unspecified: Secondary | ICD-10-CM | POA: Diagnosis not present

## 2014-03-22 DIAGNOSIS — F418 Other specified anxiety disorders: Secondary | ICD-10-CM | POA: Diagnosis not present

## 2014-03-22 DIAGNOSIS — K219 Gastro-esophageal reflux disease without esophagitis: Secondary | ICD-10-CM | POA: Diagnosis not present

## 2014-03-22 DIAGNOSIS — R1031 Right lower quadrant pain: Secondary | ICD-10-CM | POA: Diagnosis present

## 2014-03-22 DIAGNOSIS — I10 Essential (primary) hypertension: Secondary | ICD-10-CM | POA: Diagnosis not present

## 2014-03-22 DIAGNOSIS — Z86010 Personal history of colonic polyps: Secondary | ICD-10-CM | POA: Diagnosis not present

## 2014-03-22 DIAGNOSIS — R1319 Other dysphagia: Secondary | ICD-10-CM | POA: Diagnosis not present

## 2014-03-22 DIAGNOSIS — K621 Rectal polyp: Secondary | ICD-10-CM | POA: Diagnosis not present

## 2014-03-22 DIAGNOSIS — K2970 Gastritis, unspecified, without bleeding: Secondary | ICD-10-CM | POA: Diagnosis not present

## 2014-03-22 HISTORY — DX: Chronic obstructive pulmonary disease, unspecified: J44.9

## 2014-03-22 HISTORY — DX: Shortness of breath: R06.02

## 2014-03-22 HISTORY — DX: Unspecified asthma, uncomplicated: J45.909

## 2014-03-22 LAB — BASIC METABOLIC PANEL
ANION GAP: 14 (ref 5–15)
BUN: 6 mg/dL (ref 6–23)
CHLORIDE: 100 meq/L (ref 96–112)
CO2: 24 meq/L (ref 19–32)
Calcium: 9.4 mg/dL (ref 8.4–10.5)
Creatinine, Ser: 0.55 mg/dL (ref 0.50–1.10)
GFR calc non Af Amer: >90 mL/min (ref >90)
Glucose, Bld: 104 mg/dL — ABNORMAL HIGH (ref 70–99)
Potassium: 4 mEq/L (ref 3.7–5.3)
SODIUM: 138 meq/L (ref 137–147)

## 2014-03-22 LAB — HEMOGLOBIN AND HEMATOCRIT, BLOOD
HEMATOCRIT: 39 % (ref 36.0–46.0)
Hemoglobin: 13.3 g/dL (ref 12.0–15.0)

## 2014-03-22 NOTE — Patient Instructions (Signed)
[name]  03/22/2014   Your procedure is scheduled on:  03/25/2014  Report to [facility] at  50  AM.  Call this number if you have problems the morning of surgery: [phone]   Remember:   Do not eat food or drink liquids after midnight.   Take these medicines the morning of surgery with A SIP OF WATER:  Xanax, baclofen, cymbalta, reglan, metorpolol, prilosec, oxycodone, zanaflex. Take your proair before you come.   Do not wear jewelry, make-up or nail polish.  Do not wear lotions, powders, or perfumes.   Do not shave 48 hours prior to surgery. Men may shave face and neck.  Do not bring valuables to the hospital.  [facility] is not responsible for any belongings or valuables.               Contacts, dentures or bridgework may not be worn into surgery.  Leave suitcase in the car. After surgery it may be brought to your room.  For patients admitted to the hospital, discharge time is determined by your treatment team.               Patients discharged the day of surgery will not be allowed to drive home.  Name and phone number of your driver: family  Special Instructions: N/A   Please read over the following fact sheets that you were given: Pain Booklet, Coughing and Deep Breathing, Surgical Site Infection Prevention, Anesthesia Post-op Instructions and Care and Recovery After Surgery Colonoscopy A colonoscopy is an exam to look at the entire large intestine (colon). This exam can help find problems such as tumors, polyps, inflammation, and areas of bleeding. The exam takes about 1 hour.  LET [facility] CARE PROVIDER KNOW ABOUT:   Any allergies you have.  All medicines you are taking, including vitamins, herbs, eye drops, creams, and over-the-counter medicines.  Previous problems you or members of your family have had with the use of anesthetics.  Any blood disorders you have.  Previous surgeries you have had.  Medical conditions you have. RISKS AND  COMPLICATIONS  Generally, this is a safe procedure. However, as with any procedure, complications can occur. Possible complications include:  Bleeding.  Tearing or rupture of the colon wall.  Reaction to medicines given during the exam.  Infection (rare). BEFORE THE PROCEDURE   Ask your health care provider about changing or stopping your regular medicines.  You may be prescribed an oral bowel prep. This involves drinking a large amount of medicated liquid, starting the day before your procedure. The liquid will cause you to have multiple loose stools until your stool is almost clear or light green. This cleans out your colon in preparation for the procedure.  Do not eat or drink anything else once you have started the bowel prep, unless your health care provider tells you it is safe to do so.  Arrange for someone to drive you home after the procedure. PROCEDURE   You will be given medicine to help you relax (sedative).  You will lie on your side with your knees bent.  A long, flexible tube with a light and camera on the end (colonoscope) will be inserted through the rectum and into the colon. The camera sends video back to a computer screen as it moves through the colon. The colonoscope also releases carbon dioxide gas to inflate the colon. This helps your health care provider see the area better.  During the exam, your health care provider may take a small tissue sample (biopsy) to be examined under a microscope if any abnormalities are found.  The exam is finished when the entire colon has been viewed. AFTER THE PROCEDURE   Do not drive for 24 hours after the exam.  You may have a small amount of blood in your stool.  You may pass moderate amounts of gas and have mild abdominal cramping or bloating. This is caused by the gas used to inflate your colon during the exam.  Ask when your test results will be ready and how you will get your results. Make sure you get your test  results. Document Released: 05/18/2000 Document Revised: 03/11/2013 Document Reviewed: 01/26/2013 [facility] Patient Information 2015 [place], [place]. This information is not intended to replace advice given to you by your health care provider. Make sure you discuss any questions you have with your health care provider. Esophageal Dilatation The esophagus is the long, narrow tube which carries food and liquid from the mouth to the stomach. Esophageal dilatation is the technique used to stretch a blocked or narrowed portion of the esophagus. This procedure is used when a part of the esophagus has become so narrow that it becomes difficult, painful or even impossible to swallow. This is generally an uncomplicated form of treatment. When this is not successful, chest surgery may be required. This is a much more extensive form of treatment with a longer recovery time. CAUSES  Some of the more common causes of blockage or strictures of the esophagus are:  Narrowing from longstanding inflammation (soreness and redness) of the lower esophagus. This comes from the constant exposure of the lower esophagus to the acid which bubbles up from the stomach. Over time this causes scarring and narrowing of the lower esophagus.  Hiatal hernia in which a small part of the stomach bulges (herniates) up through the diaphragm. This can cause a gradual narrowing of the end of the esophagus.  Schatzki ring is a narrow ring of benign (non-cancerous) fibrous tissue which constricts the lower esophagus. The reason for this is not known.  Scleroderma is a connective tissue disorder that affects the esophagus and makes swallowing difficult.  Achalasia is an absence of nerves to the lower esophagus and to the esophageal sphincter. This is the circular muscle between the stomach and esophagus that relaxes to allow food into the stomach. After swallowing, it contracts to keep food in the stomach. This absence of nerves may be  congenital (present since birth). This can cause irregular spasms of the lower esophageal muscle. This spasm does not open up to allow food and fluid through. The result is a persistent blockage with subsequent slow trickling of the esophageal contents into the stomach.  Strictures may develop from swallowing materials which damage the esophagus. Some examples are strong acids or alkalis such as lye.  Growths such as benign (non-cancerous) and malignant (cancerous) tumors can block the esophagus.  Hereditary (present since birth) causes. DIAGNOSIS  Your caregiver often suspects this problem by taking a medical history. They will also do a physical exam. They can then prove their suspicions using X-rays and endoscopy. Endoscopy is an exam in which a tube like a small, flexible telescope is used to look at your esophagus.  TREATMENT There are different stretching (dilating) techniques that can be used. Simple bougie dilatation may be done in the office. This usually takes only a couple minutes. A numbing (anesthetic) spray of the throat is used.  Endoscopy, when done, is done in an endoscopy suite under mild sedation. When fluoroscopy is used, the procedure is performed in X-ray. Other techniques require a little longer time. Recovery is usually quick. There is no waiting time to begin eating and drinking to test success of the treatment. Following are some of the methods used. Narrowing of the esophagus is treated by making it bigger. Commonly this is a mechanical problem which can be treated with stretching. This can be done in different ways. Your caregiver will discuss these with you. Some of the means used are:  A series of graduated (increasing thickness) flexible dilators can be used. These are weighted tubes passed through the esophagus into the stomach. The tubes used become progressively larger until the desired stretched size is reached. Graduated dilators are a simple and quick way of opening  the esophagus. No visualization is required.  Another method is the use of endoscopy to place a flexible wire across the stricture. The endoscope is removed and the wire left in place. A dilator with a hole through it from end to end is guided down the esophagus and across the stricture. One or more of these dilators are passed over the wire. At the end of the exam, the wire is removed. This type of treatment may be performed in the X-ray department under fluoroscopy. An advantage of this procedure is the examiner is visualizing the end opening in the esophagus.  Stretching of the esophagus may be done using balloons. Deflated balloons are placed through the endoscope and across the stricture. This type of balloon dilatation is often done at the time of endoscopy or fluoroscopy. Flexible endoscopy allows the examiner to directly view the stricture. A balloon is inserted in the deflated form into the area of narrowing. It is then inflated with air to a certain pressure that is preset for a given circumference. When inflated, it becomes sausage shaped, stretched, and makes the stricture larger.  Achalasia requires a longer, larger balloon-type dilator. This is frequently done under X-ray control. In this situation, the spastic muscle fibers in the lower esophagus are stretched. All of the above procedures make the passage of food and water into the stomach easier. They also make it easier for stomach contents to reflux back into the esophagus. Special medications may be used following the procedure to help prevent further stricturing. Proton-pump inhibitor medications are good at decreasing the amount of acid in the stomach juice. When stomach juice refluxes into the esophagus, the juice is no longer as acidic and is less likely to burn or scar the esophagus. RISKS AND COMPLICATIONS Esophageal dilatation is usually performed effectively and without problems. Some complications that can occur are:  A small  amount of bleeding almost always happens where the stretching takes place. If this is too excessive it may require more aggressive treatment.  An uncommon complication is perforation (making a hole) of the esophagus. The esophagus is thin. It is easy to make a hole in it. If this happens, an operation may be necessary to repair this.  A small, undetected perforation could lead to an infection in the chest. This can be very serious. HOME CARE INSTRUCTIONS   If you received sedation for your procedure, do not drive, make important decisions, or perform any activities requiring your full coordination. Do not drink alcohol, take sedatives, or use any mind altering chemicals unless instructed by your caregiver.  You may use throat lozenges or warm salt water gargles if you have  throat discomfort.  You can begin eating and drinking normally on return home unless instructed otherwise. Do not purposely try to force large chunks of food down to test the benefits of your procedure.  Mild discomfort can be eased with sips of ice water.  Medications for discomfort may or may not be needed. SEEK IMMEDIATE MEDICAL CARE IF:   You begin vomiting up blood.  You develop black, tarry stools.  You develop chills or an unexplained temperature of over 101F (38.3C)  You develop chest or abdominal pain.  You develop shortness of breath, or feel light-headed or faint.  Your swallowing is becoming more painful, difficult, or you are unable to swallow. MAKE SURE YOU:   Understand these instructions.  Will watch your condition.  Will get help right away if you are not doing well or get worse. Document Released: 07/12/2005 Document Revised: 10/05/2013 Document Reviewed: 08/29/2005 [facility] Patient Information 2015 [place], [place]. This information is not intended to replace advice given to you by your health care provider. Make sure you discuss any questions you have with your health care  provider. Esophagogastroduodenoscopy Esophagogastroduodenoscopy (EGD) is a procedure to examine the lining of the esophagus, stomach, and first part of the small intestine (duodenum). A long, flexible, lighted tube with a camera attached (endoscope) is inserted down the throat to view these organs. This procedure is done to detect problems or abnormalities, such as inflammation, bleeding, ulcers, or growths, in order to treat them. The procedure lasts about 5-20 minutes. It is usually an outpatient procedure, but it may need to be performed in emergency cases in the hospital. LET YOUR CAREGIVER KNOW ABOUT:   Allergies to food or medicine.  All medicines you are taking, including vitamins, herbs, eyedrops, and over-the-counter medicines and creams.  Use of steroids (by mouth or creams).  Previous problems you or members of your family have had with the use of anesthetics.  Any blood disorders you have.  Previous surgeries you have had.  Other health problems you have.  Possibility of pregnancy, if this applies. RISKS AND COMPLICATIONS  Generally, EGD is a safe procedure. However, as with any procedure, complications can occur. Possible complications include:  Infection.  Bleeding.  Tearing (perforation) of the esophagus, stomach, or duodenum.  Difficulty breathing or not being able to breath.  Excessive sweating.  Spasms of the larynx.  Slowed heartbeat.  Low blood pressure. BEFORE THE PROCEDURE  Do not eat or drink anything for 6-8 hours before the procedure or as directed by your caregiver.  Ask your caregiver about changing or stopping your regular medicines.  If you wear dentures, be prepared to remove them before the procedure.  Arrange for someone to drive you home after the procedure. PROCEDURE   A vein will be accessed to give medicines and fluids. A medicine to relax you (sedative) and a pain reliever will be given through that access into the vein.  A  numbing medicine (local anesthetic) may be sprayed on your throat for comfort and to stop you from gagging or coughing.  A mouth guard may be placed in your mouth to protect your teeth and to keep you from biting on the endoscope.  You will be asked to lie on your left side.  The endoscope is inserted down your throat and into the esophagus, stomach, and duodenum.  Air is put through the endoscope to allow your caregiver to view the lining of your esophagus clearly.  The esophagus, stomach, and duodenum is then examined.  During the exam, your caregiver may:  Remove tissue to be examined under a microscope (biopsy) for inflammation, infection, or other medical problems.  Remove growths.  Remove objects (foreign bodies) that are stuck.  Treat any bleeding with medicines or other devices that stop tissues from bleeding (hot cautery, clipping devices).  Widen (dilate) or stretch narrowed areas of the esophagus and stomach.  The endoscope will then be withdrawn. AFTER THE PROCEDURE  You will be taken to a recovery area to be monitored. You will be able to go home once you are stable and alert.  Do not eat or drink anything until the local anesthetic and numbing medicines have worn off. You may choke.  It is normal to feel bloated, have pain with swallowing, or have a sore throat for a short time. This will wear off.  Your caregiver should be able to discuss his or her findings with you. It will take longer to discuss the test results if any biopsies were taken. Document Released: 09/21/2004 Document Revised: 10/05/2013 Document Reviewed: 04/23/2012 [facility] Patient Information 2015 [place], [place]. This information is not intended to replace advice given to you by your health care provider. Make sure you discuss any questions you have with your health care provider. PATIENT INSTRUCTIONS POST-ANESTHESIA  IMMEDIATELY FOLLOWING SURGERY:  Do not drive or operate machinery for the first  twenty four hours after surgery.  Do not make any important decisions for twenty four hours after surgery or while taking narcotic pain medications or sedatives.  If you develop intractable nausea and vomiting or a severe headache please notify your doctor immediately.  FOLLOW-UP:  Please make an appointment with your surgeon as instructed. You do not need to follow up with anesthesia unless specifically instructed to do so.  WOUND CARE INSTRUCTIONS (if applicable):  Keep a dry clean dressing on the anesthesia/puncture wound site if there is drainage.  Once the wound has quit draining you may leave it open to air.  Generally you should leave the bandage intact for twenty four hours unless there is drainage.  If the epidural site drains for more than 36-48 hours please call the anesthesia department.  QUESTIONS?:  Please feel free to call your physician or the hospital operator if you have any questions, and they will be happy to assist you.

## 2014-03-22 NOTE — Pre-Procedure Instructions (Signed)
Patient given information to sign up for my chart at home. 

## 2014-03-25 ENCOUNTER — Encounter (HOSPITAL_COMMUNITY): Payer: Self-pay

## 2014-03-25 ENCOUNTER — Encounter (HOSPITAL_COMMUNITY): Payer: Medicare HMO | Admitting: Anesthesiology

## 2014-03-25 ENCOUNTER — Ambulatory Visit (HOSPITAL_COMMUNITY): Payer: Medicare HMO | Admitting: Anesthesiology

## 2014-03-25 ENCOUNTER — Ambulatory Visit (HOSPITAL_COMMUNITY)
Admission: RE | Admit: 2014-03-25 | Discharge: 2014-03-25 | Disposition: A | Discharge status: Home / Self Care | Payer: Medicare HMO | Source: Ambulatory Visit | Attending: Internal Medicine | Admitting: Internal Medicine

## 2014-03-25 ENCOUNTER — Encounter (HOSPITAL_COMMUNITY)
Admission: RE | Disposition: A | Discharge status: Home / Self Care | Payer: Self-pay | Source: Ambulatory Visit | Attending: Internal Medicine

## 2014-03-25 DIAGNOSIS — K921 Melena: Secondary | ICD-10-CM | POA: Insufficient documentation

## 2014-03-25 DIAGNOSIS — I10 Essential (primary) hypertension: Secondary | ICD-10-CM | POA: Insufficient documentation

## 2014-03-25 DIAGNOSIS — K2970 Gastritis, unspecified, without bleeding: Secondary | ICD-10-CM | POA: Diagnosis not present

## 2014-03-25 DIAGNOSIS — Z86010 Personal history of colonic polyps: Secondary | ICD-10-CM

## 2014-03-25 DIAGNOSIS — F418 Other specified anxiety disorders: Secondary | ICD-10-CM | POA: Insufficient documentation

## 2014-03-25 DIAGNOSIS — K219 Gastro-esophageal reflux disease without esophagitis: Secondary | ICD-10-CM | POA: Insufficient documentation

## 2014-03-25 DIAGNOSIS — K621 Rectal polyp: Secondary | ICD-10-CM | POA: Insufficient documentation

## 2014-03-25 DIAGNOSIS — K635 Polyp of colon: Secondary | ICD-10-CM | POA: Insufficient documentation

## 2014-03-25 DIAGNOSIS — K5730 Diverticulosis of large intestine without perforation or abscess without bleeding: Secondary | ICD-10-CM | POA: Insufficient documentation

## 2014-03-25 DIAGNOSIS — R1319 Other dysphagia: Secondary | ICD-10-CM | POA: Insufficient documentation

## 2014-03-25 DIAGNOSIS — D128 Benign neoplasm of rectum: Secondary | ICD-10-CM

## 2014-03-25 DIAGNOSIS — Z79899 Other long term (current) drug therapy: Secondary | ICD-10-CM | POA: Insufficient documentation

## 2014-03-25 DIAGNOSIS — D124 Benign neoplasm of descending colon: Secondary | ICD-10-CM

## 2014-03-25 DIAGNOSIS — R1310 Dysphagia, unspecified: Secondary | ICD-10-CM

## 2014-03-25 DIAGNOSIS — K5900 Constipation, unspecified: Secondary | ICD-10-CM | POA: Insufficient documentation

## 2014-03-25 HISTORY — PX: MALONEY DILATION: SHX5535

## 2014-03-25 HISTORY — PX: ESOPHAGOGASTRODUODENOSCOPY (EGD) WITH PROPOFOL: SHX5813

## 2014-03-25 HISTORY — PX: POLYPECTOMY: SHX5525

## 2014-03-25 HISTORY — PX: BIOPSY: SHX5522

## 2014-03-25 HISTORY — PX: COLONOSCOPY WITH PROPOFOL: SHX5780

## 2014-03-25 SURGERY — COLONOSCOPY WITH PROPOFOL
Anesthesia: Monitor Anesthesia Care

## 2014-03-25 MED ORDER — BUTAMBEN-TETRACAINE-BENZOCAINE 2-2-14 % EX AERO
1.0000 | INHALATION_SPRAY | Freq: Once | CUTANEOUS | Status: AC
  Administered 2014-03-25: 1 via TOPICAL
  Filled 2014-03-25: qty 56

## 2014-03-25 MED ORDER — PROPOFOL 10 MG/ML IV BOLUS
INTRAVENOUS | Status: AC
  Filled 2014-03-25: qty 20

## 2014-03-25 MED ORDER — PROPOFOL INFUSION 10 MG/ML OPTIME
INTRAVENOUS | Status: DC | PRN
Start: 2014-03-25 — End: 2014-03-25
  Administered 2014-03-25: 125 ug/kg/min via INTRAVENOUS
  Administered 2014-03-25: 11:00:00 via INTRAVENOUS

## 2014-03-25 MED ORDER — LIDOCAINE HCL (PF) 1 % IJ SOLN
INTRAMUSCULAR | Status: AC
  Filled 2014-03-25: qty 5

## 2014-03-25 MED ORDER — ONDANSETRON HCL 4 MG/2ML IJ SOLN
INTRAMUSCULAR | Status: AC
  Filled 2014-03-25: qty 2

## 2014-03-25 MED ORDER — ONDANSETRON HCL 4 MG/2ML IJ SOLN
4.0000 mg | Freq: Once | INTRAMUSCULAR | Status: DC | PRN
Start: 2014-03-25 — End: 2014-03-25

## 2014-03-25 MED ORDER — MIDAZOLAM HCL 2 MG/2ML IJ SOLN
INTRAMUSCULAR | Status: AC
  Filled 2014-03-25: qty 2

## 2014-03-25 MED ORDER — ONDANSETRON HCL 4 MG/2ML IJ SOLN
4.0000 mg | Freq: Once | INTRAMUSCULAR | Status: AC
  Administered 2014-03-25: 4 mg via INTRAVENOUS

## 2014-03-25 MED ORDER — FENTANYL CITRATE 0.05 MG/ML IJ SOLN: 25.0000 ug | INTRAMUSCULAR | Status: DC | PRN

## 2014-03-25 MED ORDER — FENTANYL CITRATE 0.05 MG/ML IJ SOLN
25.0000 ug | INTRAMUSCULAR | Status: AC
  Administered 2014-03-25 (×2): 25 ug via INTRAVENOUS

## 2014-03-25 MED ORDER — STERILE WATER FOR IRRIGATION IR SOLN
Status: DC | PRN
Start: 2014-03-25 — End: 2014-03-25
  Administered 2014-03-25: 11:00:00

## 2014-03-25 MED ORDER — FENTANYL CITRATE 0.05 MG/ML IJ SOLN
INTRAMUSCULAR | Status: AC
  Filled 2014-03-25: qty 2

## 2014-03-25 MED ORDER — LACTATED RINGERS IV SOLN
INTRAVENOUS | Status: DC
  Administered 2014-03-25: 10:00:00 via INTRAVENOUS

## 2014-03-25 MED ORDER — MIDAZOLAM HCL 2 MG/2ML IJ SOLN
1.0000 mg | INTRAMUSCULAR | Status: DC | PRN
  Administered 2014-03-25 (×2): 2 mg via INTRAVENOUS
  Filled 2014-03-25: qty 2

## 2014-03-25 SURGICAL SUPPLY — 20 items
BLOCK BITE 60FR ADLT L/F BLUE (MISCELLANEOUS) ×3 IMPLANT
FLOOR PAD 36X40 (MISCELLANEOUS) ×3
FORCEPS BIOP RAD 4 LRG CAP 4 (CUTTING FORCEPS) ×3 IMPLANT
FORMALIN 10 PREFIL 20ML (MISCELLANEOUS) ×3 IMPLANT
KIT CLEAN ENDO COMPLIANCE (KITS) ×3 IMPLANT
LUBRICANT JELLY 4.5OZ STERILE (MISCELLANEOUS) ×1 IMPLANT
MANIFOLD NEPTUNE II (INSTRUMENTS) ×1 IMPLANT
NDL SCLEROTHERAPY 25GX240 (NEEDLE) ×2 IMPLANT
NEEDLE SCLEROTHERAPY 25GX240 (NEEDLE) ×3 IMPLANT
PAD FLOOR 36X40 (MISCELLANEOUS) ×2 IMPLANT
PROBE APC STR FIRE (PROBE) ×3 IMPLANT
PROBE INJECTION GOLD (MISCELLANEOUS) ×3
PROBE INJECTION GOLD 7FR (MISCELLANEOUS) ×2 IMPLANT
SNARE ROTATE MED OVAL 20MM (MISCELLANEOUS) ×4 IMPLANT
SNARE SHORT THROW 13M SML OVAL (MISCELLANEOUS) ×3 IMPLANT
SYR 50ML LL SCALE MARK (SYRINGE) ×1 IMPLANT
TRAP SPECIMEN MUCOUS 40CC (MISCELLANEOUS) ×1 IMPLANT
TUBING ENDO SMARTCAP PENTAX (MISCELLANEOUS) ×3 IMPLANT
TUBING IRRIGATION ENDOGATOR (MISCELLANEOUS) ×3 IMPLANT
WATER STERILE IRR 1000ML POUR (IV SOLUTION) ×1 IMPLANT

## 2014-03-25 NOTE — Addendum Note (Signed)
Addendum created 03/25/14 1428 by Ollen Bowl, CRNA   Modules edited: Charges VN

## 2014-03-25 NOTE — Op Note (Signed)
[facility] [address] [place], [address]   COLONOSCOPY PROCEDURE REPORT  PATIENT: Tammy, [name]  MR#: [id number] BIRTHDATE: 12-25-1960 , 23  yrs. old GENDER: female ENDOSCOPIST: R.  Garfield Cornea, MD FACP [facility] REFERRED HQ:[id number] Mann, PA-C PROCEDURE DATE:  04/08/14 PROCEDURE:   Colonoscopy with cold biopsy polypectomy and Colonoscopy with snare polypectomy INDICATIONS:History of colonic adenoma; abnormal transverse colon suggested on CT. MEDICATIONS: Deep sedation per Dr.  [facility] ASA CLASS:       Class II  CONSENT: The risks, benefits, alternatives and imponderables including but not limited to bleeding, perforation as well as the possibility of a missed lesion have been reviewed.  The potential for biopsy, lesion removal, etc. have also been discussed. Questions have been answered.  All parties agreeable.  Please see the history and physical in the medical record for more information.  DESCRIPTION OF PROCEDURE:   After the risks benefits and alternatives of the procedure were thoroughly explained, informed consent was obtained.  The digital rectal exam revealed no abnormalities of the rectum.   The     endoscope was introduced through the anus and advanced to the cecum, which was identified by both the appendix and ileocecal valve. No adverse events experienced.   The quality of the prep was adequate.  The instrument was then slowly withdrawn as the colon was fully examined.      COLON FINDINGS: (1) diminutive polyp in the rectum at 10 cm from the anal verge; otherwise, the remainder of the rectal mucosa appeared normal.  Couple of left-sided diverticula observed; (1) 4 mm polyp in the mid descending segment; otherwise, the remainder of the colonic mucosa appeared normal.  Specifically no thickening or other mucosal abnormality otherwise anywhere throughout the colon. The above-mentioned polyps were cold biopsy / removed  and cold snare removed, respectively.  Retroflexed views revealed no abnormalities. .  Withdrawal time=14 minutes 0 seconds.  The scope was withdrawn and the procedure completed. COMPLICATIONS: There were no immediate complications.  ENDOSCOPIC IMPRESSION: Rectal and colonic polyps removed as described above. Colonic diverticulosis. CT abnormality likely artifactual in origin  RECOMMENDATIONS: Continuance Linzess 145 daily. Followup on pathology. See EGD report.  eSigned:  R. Garfield Cornea, MD Rosalita Chessman [facility] 2014/04/08 12:31 PM   cc:  CPT CODES: ICD CODES:  The ICD and CPT codes recommended by this software are interpretations from the data that the clinical staff has captured with the software.  The verification of the translation of this report to the ICD and CPT codes and modifiers is the sole responsibility of the health care institution and practicing physician where this report was generated.  [employer] will not be held responsible for the validity of the ICD and CPT codes included on this report.  AMA assumes no liability for data contained or not contained herein. CPT is a Designer, television/film set of the [employer].  PATIENT NAME:  Tammy, [name] MR#: [id number]

## 2014-03-25 NOTE — Op Note (Signed)
[facility] [address] [place], [address]   ENDOSCOPY PROCEDURE REPORT  PATIENT: [name], [name]  MR#: [id number] BIRTHDATE: [birth year] , 22  yrs. old GENDER: female ENDOSCOPIST: R.  Garfield Cornea, MD [facility] REFERRED BY:  Collene Mares, PA-C PROCEDURE DATE:  03/29/14 PROCEDURE:  EGD w/ biopsy and Maloney dilation of esophagus INDICATIONS:  GERD; esophageal dysphagia. MEDICATIONS: deep sedation per Dr[facility]  [facility] ASA CLASS:      Class II  CONSENT: The risks, benefits, limitations, alternatives and imponderables have been discussed.  The potential for biopsy, esophogeal dilation, etc. have also been reviewed.  Questions have been answered.  All parties agreeable.  Please see the history and physical in the medical record for more information.  DESCRIPTION OF PROCEDURE: After the risks benefits and alternatives of the procedure were thoroughly explained, informed consent was obtained.  The    endoscope was introduced through the mouth and advanced to the second portion of the duodenum , limited by Without limitations. The instrument was slowly withdrawn as the mucosa was fully examined.    Normal appearing tubular esophagus.  Stomach empty;  mucosa diffusely erythematous with a couple of gastric erosions in the antrum.  Some bile-stained mucus overlying the mucosa.  No ulcer or infiltrating process.  Patent pylorus.  Normal first and second portion of the duodenum  54 [place] Maloney dilators passed to full insertion with mild to moderate resistance.  A look back revealed a small tear through the upper esophageal sphincter mucosa.  Retroflexed views revealed as previously described.    Biopsy of the abnormal gastric mucosa taken. The scope was then withdrawn from the patient and the procedure completed.  COMPLICATIONS: There were no immediate complications.  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 Abnormal gastric mucosa of uncertain significance-status post gastric biopsy  RECOMMENDATIONS: Continue Prilosec 20 mg daily. Further recommendations to follow pending review of pathology report. See colonoscopy report.   REPEAT EXAM:  eSigned:  R. Garfield Cornea, MD Rosalita Chessman [facility] 29-Mar-2014 12:26 PM    CC:  CPT CODES: ICD CODES:  The ICD and CPT codes recommended by this software are interpretations from the data that the clinical staff has captured with the software.  The verification of the translation of this report to the ICD and CPT codes and modifiers is the sole responsibility of the health care institution and practicing physician where this report was generated.  [employer] will not be held responsible for the validity of the ICD and CPT codes included on this report.  AMA assumes no liability for data contained or not contained herein. CPT is a Designer, television/film set of the [employer].  PATIENT NAME:  [name], [name] MR#: [id number]

## 2014-03-25 NOTE — Anesthesia Postprocedure Evaluation (Signed)
  Anesthesia Post-op Note  Patient: [name]  Procedure(s) Performed: Procedure(s): COLONOSCOPY WITH PROPOFOL; At cecum 1140; total withdrawal time=14 minutes (N/A) ESOPHAGOGASTRODUODENOSCOPY (EGD) WITH PROPOFOL (N/A) MALONEY ESOPHAGEAL DILATION #54 Fr (N/A) GASTRIC BIOPSIES SIGMOID AND RECTAL POLYPECTOMY  Patient Location: PACU  Anesthesia Type:MAC  Level of Consciousness: awake, alert  and oriented  Airway and Oxygen Therapy: Patient Spontanous Breathing  Post-op Pain: none  Post-op Assessment: Post-op Vital signs reviewed, Patient's Cardiovascular Status Stable, Respiratory Function Stable, Patent Airway and No signs of Nausea or vomiting  Post-op Vital Signs: Reviewed and stable  Last Vitals:  Filed Vitals:   03/25/14 1233  BP: 129/76  Pulse:   Temp:   Resp:     Complications: No apparent anesthesia complications, late entry, patient seen before discharge.

## 2014-03-25 NOTE — Discharge Instructions (Addendum)
Colonoscopy Discharge Instructions  Read the instructions outlined below and refer to this sheet in the next few weeks. These discharge instructions provide you with general information on caring for yourself after you leave the hospital. Your doctor may also give you specific instructions. While your treatment has been planned according to the most current medical practices available, unavoidable complications occasionally occur. If you have any problems or questions after discharge, call Dr. Gala Romney at [phone]. ACTIVITY  You may resume your regular activity, but move at a slower pace for the next 24 hours.   Take frequent rest periods for the next 24 hours.   Walking will help get rid of the air and reduce the bloated feeling in your belly (abdomen).   No driving for 24 hours (because of the medicine (anesthesia) used during the test).    Do not sign any important legal documents or operate any machinery for 24 hours (because of the anesthesia used during the test).  NUTRITION  Drink plenty of fluids.   You may resume your normal diet as instructed by your doctor.   Begin with a light meal and progress to your normal diet. Heavy or fried foods are harder to digest and may make you feel sick to your stomach (nauseated).   Avoid alcoholic beverages for 24 hours or as instructed.  MEDICATIONS  You may resume your normal medications unless your doctor tells you otherwise.  WHAT YOU CAN EXPECT TODAY  Some feelings of bloating in the abdomen.   Passage of more gas than usual.   Spotting of blood in your stool or on the toilet paper.  IF YOU HAD POLYPS REMOVED DURING THE COLONOSCOPY:  No aspirin products for 7 days or as instructed.   No alcohol for 7 days or as instructed.   Eat a soft diet for the next 24 hours.  FINDING OUT THE RESULTS OF YOUR TEST Not all test results are available during your visit. If your test results are not back during the visit, make an appointment  with your caregiver to find out the results. Do not assume everything is normal if you have not heard from your caregiver or the medical facility. It is important for you to follow up on all of your test results.  SEEK IMMEDIATE MEDICAL ATTENTION IF:  You have more than a spotting of blood in your stool.   Your belly is swollen (abdominal distention).   You are nauseated or vomiting.   You have a temperature over 101.   You have abdominal pain or discomfort that is severe or gets worse throughout the day.  EGD Discharge instructions Please read the instructions outlined below and refer to this sheet in the next few weeks. These discharge instructions provide you with general information on caring for yourself after you leave the hospital. Your doctor may also give you specific instructions. While your treatment has been planned according to the most current medical practices available, unavoidable complications occasionally occur. If you have any problems or questions after discharge, please call your doctor. ACTIVITY You may resume your regular activity but move at a slower pace for the next 24 hours.  Take frequent rest periods for the next 24 hours.  Walking will help expel (get rid of) the air and reduce the bloated feeling in your abdomen.  No driving for 24 hours (because of the anesthesia (medicine) used during the test).  You may shower.  Do not sign any important legal documents or operate any machinery  for 24 hours (because of the anesthesia used during the test).  NUTRITION Drink plenty of fluids.  You may resume your normal diet.  Begin with a light meal and progress to your normal diet.  Avoid alcoholic beverages for 24 hours or as instructed by your caregiver.  MEDICATIONS You may resume your normal medications unless your caregiver tells you otherwise.  WHAT YOU CAN EXPECT TODAY You may experience abdominal discomfort such as a feeling of fullness or gas pains.    FOLLOW-UP Your doctor will discuss the results of your test with you.  SEEK IMMEDIATE MEDICAL ATTENTION IF ANY OF THE FOLLOWING OCCUR: Excessive nausea (feeling sick to your stomach) and/or vomiting.  Severe abdominal pain and distention (swelling).  Trouble swallowing.  Temperature over 101 F (37.8 C).  Rectal bleeding or vomiting of blood.   Continue Prilosec 20 mg daily  Continued Linzess 145 daily  Polyp information provided  Office visit with [place] in 8 weeks.  Further recommendations to follow pending review of pathology report  Colon Polyps Polyps are lumps of extra tissue growing inside the body. Polyps can grow in the large intestine (colon). Most colon polyps are noncancerous (benign). However, some colon polyps can become cancerous over time. Polyps that are larger than a pea may be harmful. To be safe, caregivers remove and test all polyps. CAUSES  Polyps form when mutations in the genes cause your cells to grow and divide even though no more tissue is needed. RISK FACTORS There are a number of risk factors that can increase your chances of getting colon polyps. They include:  Being older than 50 years.  Family history of colon polyps or colon cancer.  Long-term colon diseases, such as colitis or Crohn disease.  Being overweight.  Smoking.  Being inactive.  Drinking too much alcohol. SYMPTOMS  Most small polyps do not cause symptoms. If symptoms are present, they may include:  Blood in the stool. The stool may look dark red or black.  Constipation or diarrhea that lasts longer than 1 week. DIAGNOSIS People often do not know they have polyps until their caregiver finds them during a regular checkup. Your caregiver can use 4 tests to check for polyps:  Digital rectal exam. The caregiver wears gloves and feels inside the rectum. This test would find polyps only in the rectum.  Barium enema. The caregiver puts a liquid called barium into your rectum before  taking X-rays of your colon. Barium makes your colon look white. Polyps are dark, so they are easy to see in the X-ray pictures.  Sigmoidoscopy. A thin, flexible tube (sigmoidoscope) is placed into your rectum. The sigmoidoscope has a light and tiny camera in it. The caregiver uses the sigmoidoscope to look at the last third of your colon.  Colonoscopy. This test is like sigmoidoscopy, but the caregiver looks at the entire colon. This is the most common method for finding and removing polyps. TREATMENT  Any polyps will be removed during a sigmoidoscopy or colonoscopy. The polyps are then tested for cancer. PREVENTION  To help lower your risk of getting more colon polyps:  Eat plenty of fruits and vegetables. Avoid eating fatty foods.  Do not smoke.  Avoid drinking alcohol.  Exercise every day.  Lose weight if recommended by your caregiver.  Eat plenty of calcium and folate. Foods that are rich in calcium include milk, cheese, and broccoli. Foods that are rich in folate include chickpeas, kidney beans, and spinach. HOME CARE INSTRUCTIONS Keep all follow-up  appointments as directed by your caregiver. You may need periodic exams to check for polyps. SEEK MEDICAL CARE IF: You notice bleeding during a bowel movement. Document Released: 02/15/2004 Document Revised: 08/13/2011 Document Reviewed: 07/31/2011 [facility] Patient Information 2015 [place], [place]. This information is not intended to replace advice given to you by your health care provider. Make sure you discuss any questions you have with your health care provider.

## 2014-03-25 NOTE — Interval H&P Note (Signed)
History and Physical Interval Note:  03/25/2014 10:12 AM  [name]  has presented today for surgery, with the diagnosis of family history of colon cancer/colon polyps/rectal bleeding  The various methods of treatment have been discussed with the patient and family. After consideration of risks, benefits and other options for treatment, the patient has consented to  Procedure(s) with comments: COLONOSCOPY WITH PROPOFOL (N/A) - 1100 ESOPHAGOGASTRODUODENOSCOPY (EGD) WITH PROPOFOL (N/A) SAVORY DILATION (N/A) MALONEY DILATION (N/A) as a surgical intervention .  The patient's history has been reviewed, patient examined, no change in status, stable for surgery.  I have reviewed the patient's chart and labs.  Questions were answered to the patient's satisfaction.     [name]   Patient without change. Esophageal dysphagia persists. History of colonic adenoma. Abnormal colon on CT. EGD with dilation and colonoscopy as appropriate per plan.  The risks, benefits, limitations, imponderables and alternatives regarding both EGD and colonoscopy have been reviewed with the patient. Questions have been answered. All parties agreeable.

## 2014-03-25 NOTE — H&P (View-Only) (Signed)
Primary Care Physician:  Collene Mares, PA-C Primary Gastroenterologist:  Dr. Gala Romney   Chief Complaint  Patient presents with  . Abdominal Pain    HPI:   53 year old female last seen in 2011 with history of chronic abdominal pain now presenting with RLQ abdominal pain. Has intermittent nausea, stays more nauseated than not. Unable to describe pain. "weird" pain. Constant. States had no bowel movement for "35 days". Had antibiotics and went normally, now back to no bowel movements. Has no "urge" to have a bowel movement. Abdominal bloating.  Occasional low-volume hematochezia. New onset constipation. Going on for 2-3 months. No melena.  Liquid dysphagia noted. Solid food dysphagia. History of dilation in the remote past per patient. If does not take PPI, esophagus "burns" all the way up. Sometimes burns all the way up even with PPI. PCP gave her Reglan. Last EGD in 2011 with normal esophagus, gastritis. Negative H.pylori and negative celiac sprue.   Last colonoscopy in 2008 with adenomatous polyp; she was due for surveillance in 2013. CT July 2015 revealed focal area of colonic wall thickening and narrowing of transverse colon, recommending follow-up colonoscopy to exclude neoplasm.       Past Medical History  Diagnosis Date  . Hypertension   . Depression   . Anxiety   . Disc degeneration, lumbar   . Leukocytosis   . Thrombocytosis   . Lumbar disc disease   . GERD (gastroesophageal reflux disease)     Past Surgical History  Procedure Laterality Date  . Total abdominal hysterectomy w/ bilateral salpingoophorectomy      age 47  . Rt breast biopsy    . Right knee surgery    . Esophagogastroduodenoscopy  08/08/2005    ZMO:[id number] esophagogastroduodenoscopy.  . Colonoscopy  02/21/2007    YYT:KPTW papilla and internal hemorrhoids, diminutive rectal polyp/Left-sided diverticula (sigmoid) pedunculated polyps mid descending  . Esophagogastroduodenoscopy  2011    Dr. Gala Romney:  normal esophagus, small hiatal hernia, duodenal diverticulum, negative H.pylori and celiac  . Colonoscopy  2008    Dr. Gala Romney: segmental biopsy negative for microscopic colitis. Three polyps, one tubular adenoma. Surveillance due 2013.     Current Outpatient Prescriptions  Medication Sig Dispense Refill  . ALPRAZolam (XANAX) 1 MG tablet Take 1 mg by mouth 3 (three) times daily as needed. Pain       . baclofen (LIORESAL) 10 MG tablet Take 10 mg by mouth 3 (three) times daily. Muscle spasms      . DULoxetine (CYMBALTA) 60 MG capsule Take 60 mg by mouth daily.        Marland Kitchen estradiol (ESTRACE) 1 MG tablet Take 1 tablet by mouth daily.      . fentaNYL (DURAGESIC - DOSED MCG/HR) 50 MCG/HR Place 1 patch onto the skin every 3 (three) days.      . metoCLOPramide (REGLAN) 5 MG tablet Take 5 mg by mouth 4 (four) times daily -  before meals and at bedtime.       . metoprolol tartrate (LOPRESSOR) 25 MG tablet Take 25 mg by mouth daily.       Marland Kitchen omeprazole (PRILOSEC) 20 MG capsule Take 20 mg by mouth at bedtime.       Marland Kitchen oxycodone (ROXICODONE) 30 MG immediate release tablet Take 30 mg by mouth every 4 (four) hours as needed for pain.      Marland Kitchen PROAIR HFA 108 (90 BASE) MCG/ACT inhaler Inhale 2 puffs into the lungs daily as needed.      Marland Kitchen  promethazine (PHENERGAN) 25 MG tablet Take 1 tablet (25 mg total) by mouth every 8 (eight) hours as needed for nausea or vomiting.  15 tablet  0  . tiZANidine (ZANAFLEX) 4 MG tablet Take 1 tablet by mouth at bedtime as needed and may repeat dose one time if needed.      . zolpidem (AMBIEN) 10 MG tablet Take 10 mg by mouth at bedtime.        . Linaclotide (LINZESS) 145 MCG CAPS capsule Take 1 capsule (145 mcg total) by mouth daily. Take 30 minutes prior to breakfast  30 capsule  5  . polyethylene glycol-electrolytes (TRILYTE) 420 G solution Take 4,000 mLs by mouth as directed.  4000 mL  0   No current facility-administered medications for this visit.    Allergies as of 03/09/2014 -  Review Complete 03/09/2014  Allergen Reaction Noted  . Codeine Hives and Other (See Comments) 03/03/2008  . Latex Hives and Itching 02/23/2011  . Other Hives and Itching 02/23/2013    Family History  Problem Relation Age of Onset  . Adopted: Yes  . Cancer Mother   . Cancer Brother   . Cancer Maternal Aunt   . Cancer Maternal Uncle   . Cancer Paternal Aunt   . Cancer Paternal Grandfather   . Colon cancer Brother 52    History   Social History  . Marital Status: Single    Spouse Name: N/A    Number of Children: N/A  . Years of Education: N/A   Occupational History  . Not on file.   Social History Main Topics  . Smoking status: Current Every Day Smoker -- 1.00 packs/day for 30 years    Types: Cigarettes  . Smokeless tobacco: Never Used  . Alcohol Use: No     Comment: see HPI. Patient denies alcohol use at present  . Drug Use: No  . Sexual Activity: Not on file   Other Topics Concern  . Not on file   Social History Narrative  . No narrative on file    Review of Systems: Gen: Denies any fever, chills, fatigue, weight loss, lack of appetite.  CV: Denies chest pain, heart palpitations, peripheral edema, syncope.  Resp: +DOE GI: see HPI GU : Denies urinary burning, urinary frequency, urinary hesitancy MS: +chronic back pain Derm: Denies rash, itching, dry skin Psych: +depression/anxiety Heme: Denies bruising, bleeding, and enlarged lymph nodes.  Physical Exam: BP 100/66  Pulse 89  Temp(Src) 98.4 F (36.9 C) (Oral)  Ht 5\' 6"  (1.676 m)  Wt 173 lb (78.472 kg)  BMI 27.94 kg/m2 General:   Alert and oriented. Pleasant and cooperative. Well-nourished and well-developed.  Head:  Normocephalic and atraumatic. Eyes:  Without icterus, sclera clear and conjunctiva pink.  Ears:  Normal auditory acuity. Nose:  No deformity, discharge,  or lesions. Mouth:  No deformity or lesions, oral mucosa pink.  Lungs:  Clear to auscultation bilaterally. No wheezes, rales, or  rhonchi. No distress.  Heart:  S1, S2 present without murmurs appreciated.  Abdomen:  +BS, soft, mild TTP RLQ  and non-distended. No HSM noted. No guarding or rebound. No masses appreciated.  Rectal:  Deferred  Msk:  Right side weaker than left, walks with a limp Extremities:  Without clubbing or edema. Neurologic:  Alert and  oriented x4;  grossly normal neurologically. Skin:  Intact without significant lesions or rashes. Psych:  Alert and cooperative. Normal mood and affect.   CT abd/pelvis with contrast July 2015: IMPRESSION:  1. No  acute findings in the abdomen or pelvis to account for the  patient's symptoms.  2. However, there is a focal area of colonic wall thickening and  narrowing in the transverse colon. Followup nonemergent colonoscopy  is recommended in the near future to exclude the possibility of  underlying colonic neoplasm.  3. Hepatic steatosis.

## 2014-03-25 NOTE — Transfer of Care (Signed)
Immediate Anesthesia Transfer of Care Note  Patient: [name]  Procedure(s) Performed: Procedure(s): COLONOSCOPY WITH PROPOFOL; At cecum 1140; total withdrawal time=14 minutes (N/A) ESOPHAGOGASTRODUODENOSCOPY (EGD) WITH PROPOFOL (N/A) MALONEY ESOPHAGEAL DILATION #54 Fr (N/A) GASTRIC BIOPSIES SIGMOID AND RECTAL POLYPECTOMY  Patient Location: PACU  Anesthesia Type:MAC  Level of Consciousness: awake, alert  and oriented  Airway & Oxygen Therapy: Patient Spontanous Breathing and Patient connected to face mask oxygen  Post-op Assessment: Report given to PACU RN and Post -op Vital signs reviewed and stable  Post vital signs: Reviewed and stable  Complications: No apparent anesthesia complications

## 2014-03-25 NOTE — Anesthesia Preprocedure Evaluation (Signed)
Anesthesia Evaluation  Patient identified by MRN, date of birth, ID band Patient awake    Reviewed: Allergy & Precautions, H&P , NPO status , Patient's Chart, lab work & pertinent test results  Airway Mallampati: II TM Distance: >3 FB Neck ROM: Full    Dental  (+) Dental Advisory Given, Poor Dentition, Missing, Chipped,    Pulmonary shortness of breath, asthma , COPDCurrent Smoker,  breath sounds clear to auscultation        Cardiovascular hypertension, Pt. on medications Rhythm:Regular Rate:Normal     Neuro/Psych PSYCHIATRIC DISORDERS Anxiety Depression  Neuromuscular disease    GI/Hepatic GERD-  Medicated and Poorly Controlled,Epigastric pain    Endo/Other    Renal/GU      Musculoskeletal  (+) Arthritis -,   Abdominal   Peds  Hematology  (+) anemia ,   Anesthesia Other Findings   Reproductive/Obstetrics                           Anesthesia Physical Anesthesia Plan  ASA: III  Anesthesia Plan: MAC   Post-op Pain Management:    Induction: Intravenous  Airway Management Planned: Simple Face Mask  Additional Equipment:   Intra-op Plan:   Post-operative Plan:   Informed Consent: I have reviewed the patients History and Physical, chart, labs and discussed the procedure including the risks, benefits and alternatives for the proposed anesthesia with the patient or authorized representative who has indicated his/her understanding and acceptance.     Plan Discussed with:   Anesthesia Plan Comments:         Anesthesia Quick Evaluation

## 2014-03-26 ENCOUNTER — Encounter (HOSPITAL_COMMUNITY): Payer: Self-pay | Admitting: Internal Medicine

## 2014-03-29 ENCOUNTER — Encounter: Payer: Self-pay | Admitting: Internal Medicine

## 2014-04-22 ENCOUNTER — Encounter: Payer: Self-pay | Admitting: Internal Medicine

## 2014-05-14 ENCOUNTER — Ambulatory Visit (HOSPITAL_COMMUNITY)
Admission: RE | Admit: 2014-05-14 | Discharge: 2014-05-14 | Disposition: A | Discharge status: Home / Self Care | Payer: Medicare HMO | Source: Ambulatory Visit | Attending: Physician Assistant | Admitting: Physician Assistant

## 2014-05-14 ENCOUNTER — Other Ambulatory Visit (HOSPITAL_COMMUNITY): Payer: Self-pay | Admitting: Physician Assistant

## 2014-05-14 ENCOUNTER — Ambulatory Visit (HOSPITAL_COMMUNITY): Payer: Self-pay

## 2014-05-14 DIAGNOSIS — R0600 Dyspnea, unspecified: Secondary | ICD-10-CM

## 2014-05-14 DIAGNOSIS — R05 Cough: Secondary | ICD-10-CM | POA: Diagnosis not present

## 2014-05-14 DIAGNOSIS — G894 Chronic pain syndrome: Secondary | ICD-10-CM

## 2014-05-14 DIAGNOSIS — R0989 Other specified symptoms and signs involving the circulatory and respiratory systems: Secondary | ICD-10-CM | POA: Diagnosis not present

## 2014-05-15 NOTE — Progress Notes (Signed)
This encounter was created in error - please disregard.

## 2014-06-02 ENCOUNTER — Ambulatory Visit: Payer: Medicare HMO | Admitting: Gastroenterology

## 2014-06-09 ENCOUNTER — Encounter: Payer: Self-pay | Admitting: Gastroenterology

## 2014-06-09 ENCOUNTER — Telehealth: Payer: Self-pay | Admitting: Internal Medicine

## 2014-06-09 ENCOUNTER — Ambulatory Visit: Payer: Medicare HMO | Admitting: Gastroenterology

## 2014-06-09 NOTE — Telephone Encounter (Signed)
PATIENT WAS A NO SHOW ON 06/09/14 AND LETTER SENT

## 2014-08-13 ENCOUNTER — Encounter: Payer: Self-pay | Admitting: Gastroenterology

## 2014-08-30 ENCOUNTER — Ambulatory Visit: Payer: Medicare HMO | Admitting: Nurse Practitioner

## 2014-08-30 ENCOUNTER — Telehealth: Payer: Self-pay | Admitting: Gastroenterology

## 2014-08-30 ENCOUNTER — Encounter: Payer: Self-pay | Admitting: Nurse Practitioner

## 2014-08-30 NOTE — Telephone Encounter (Signed)
PATIENT WAS A NO SHOW 08/30/14 AND LETTER SENT

## 2014-08-30 NOTE — Telephone Encounter (Signed)
Noted  

## 2015-02-24 ENCOUNTER — Other Ambulatory Visit (HOSPITAL_COMMUNITY): Payer: Self-pay | Admitting: Physician Assistant

## 2015-02-24 ENCOUNTER — Ambulatory Visit (HOSPITAL_COMMUNITY)
Admission: RE | Admit: 2015-02-24 | Discharge: 2015-02-24 | Disposition: A | Discharge status: Home / Self Care | Payer: Medicare HMO | Source: Ambulatory Visit | Attending: Physician Assistant | Admitting: Physician Assistant

## 2015-02-24 DIAGNOSIS — G894 Chronic pain syndrome: Secondary | ICD-10-CM | POA: Diagnosis present

## 2015-02-24 DIAGNOSIS — R059 Cough, unspecified: Secondary | ICD-10-CM

## 2015-02-24 DIAGNOSIS — Z1389 Encounter for screening for other disorder: Secondary | ICD-10-CM | POA: Insufficient documentation

## 2015-02-24 DIAGNOSIS — M810 Age-related osteoporosis without current pathological fracture: Secondary | ICD-10-CM

## 2015-02-24 DIAGNOSIS — R05 Cough: Secondary | ICD-10-CM | POA: Insufficient documentation

## 2015-03-09 ENCOUNTER — Other Ambulatory Visit (HOSPITAL_COMMUNITY): Payer: Medicare HMO

## 2015-04-11 DIAGNOSIS — Z23 Encounter for immunization: Secondary | ICD-10-CM | POA: Diagnosis not present

## 2015-04-25 DIAGNOSIS — G894 Chronic pain syndrome: Secondary | ICD-10-CM | POA: Diagnosis not present

## 2015-04-25 DIAGNOSIS — Z0000 Encounter for general adult medical examination without abnormal findings: Secondary | ICD-10-CM | POA: Diagnosis not present

## 2015-04-25 DIAGNOSIS — R609 Edema, unspecified: Secondary | ICD-10-CM | POA: Diagnosis not present

## 2015-04-25 DIAGNOSIS — Z0001 Encounter for general adult medical examination with abnormal findings: Secondary | ICD-10-CM | POA: Diagnosis not present

## 2015-04-25 DIAGNOSIS — Z6834 Body mass index (BMI) 34.0-34.9, adult: Secondary | ICD-10-CM | POA: Diagnosis not present

## 2015-04-26 ENCOUNTER — Other Ambulatory Visit: Payer: Self-pay

## 2015-04-26 DIAGNOSIS — Z1231 Encounter for screening mammogram for malignant neoplasm of breast: Secondary | ICD-10-CM

## 2015-05-25 ENCOUNTER — Other Ambulatory Visit (HOSPITAL_COMMUNITY): Payer: Medicare HMO

## 2015-06-08 ENCOUNTER — Encounter: Payer: Self-pay | Admitting: *Deleted

## 2015-06-16 ENCOUNTER — Other Ambulatory Visit: Payer: Self-pay | Admitting: Obstetrics & Gynecology

## 2015-06-29 ENCOUNTER — Other Ambulatory Visit: Payer: Self-pay | Admitting: Advanced Practice Midwife

## 2015-07-11 DIAGNOSIS — Z6835 Body mass index (BMI) 35.0-35.9, adult: Secondary | ICD-10-CM | POA: Diagnosis not present

## 2015-07-11 DIAGNOSIS — E6609 Other obesity due to excess calories: Secondary | ICD-10-CM | POA: Diagnosis not present

## 2015-07-11 DIAGNOSIS — G894 Chronic pain syndrome: Secondary | ICD-10-CM | POA: Diagnosis not present

## 2015-07-11 DIAGNOSIS — E538 Deficiency of other specified B group vitamins: Secondary | ICD-10-CM | POA: Diagnosis not present

## 2015-07-11 DIAGNOSIS — L509 Urticaria, unspecified: Secondary | ICD-10-CM | POA: Diagnosis not present

## 2015-08-31 DIAGNOSIS — G894 Chronic pain syndrome: Secondary | ICD-10-CM | POA: Diagnosis not present

## 2015-08-31 DIAGNOSIS — Z6834 Body mass index (BMI) 34.0-34.9, adult: Secondary | ICD-10-CM | POA: Diagnosis not present

## 2015-08-31 DIAGNOSIS — E538 Deficiency of other specified B group vitamins: Secondary | ICD-10-CM | POA: Diagnosis not present

## 2015-09-14 ENCOUNTER — Emergency Department (HOSPITAL_COMMUNITY)
Admission: EM | Admit: 2015-09-14 | Discharge: 2015-09-14 | Disposition: A | Discharge status: Home / Self Care | Payer: Medicare HMO | Attending: Dermatology | Admitting: Dermatology

## 2015-09-14 ENCOUNTER — Encounter (HOSPITAL_COMMUNITY): Payer: Self-pay | Admitting: *Deleted

## 2015-09-14 DIAGNOSIS — L03317 Cellulitis of buttock: Secondary | ICD-10-CM | POA: Diagnosis not present

## 2015-09-14 DIAGNOSIS — J45909 Unspecified asthma, uncomplicated: Secondary | ICD-10-CM | POA: Insufficient documentation

## 2015-09-14 DIAGNOSIS — Z79899 Other long term (current) drug therapy: Secondary | ICD-10-CM | POA: Diagnosis not present

## 2015-09-14 DIAGNOSIS — J449 Chronic obstructive pulmonary disease, unspecified: Secondary | ICD-10-CM | POA: Insufficient documentation

## 2015-09-14 DIAGNOSIS — I10 Essential (primary) hypertension: Secondary | ICD-10-CM | POA: Insufficient documentation

## 2015-09-14 DIAGNOSIS — F329 Major depressive disorder, single episode, unspecified: Secondary | ICD-10-CM | POA: Insufficient documentation

## 2015-09-14 DIAGNOSIS — Z5321 Procedure and treatment not carried out due to patient leaving prior to being seen by health care provider: Secondary | ICD-10-CM | POA: Insufficient documentation

## 2015-09-14 DIAGNOSIS — L0231 Cutaneous abscess of buttock: Secondary | ICD-10-CM | POA: Diagnosis not present

## 2015-09-14 DIAGNOSIS — R319 Hematuria, unspecified: Secondary | ICD-10-CM | POA: Diagnosis not present

## 2015-09-14 DIAGNOSIS — R69 Illness, unspecified: Secondary | ICD-10-CM | POA: Diagnosis not present

## 2015-09-14 NOTE — ED Notes (Addendum)
Pt has an abscess in between her cheeks on the right side of her buttock that burst today around 1 pm. Pt c/o pain and bleeding to this area. Pt has had this before on her right buttocks.

## 2015-09-14 NOTE — ED Notes (Signed)
Pt states she cannot wait any longer because her daughter is on her way to pick her up and she is her only way home.  Pt states she will come back if the bleeding starts again from an open area between her buttocks.  Pt states there was a knot there until today at 1 pm when it started bleeding and the knot has gone down, feels it was an abcess.   Pt left without signing out.

## 2015-09-16 ENCOUNTER — Emergency Department (HOSPITAL_COMMUNITY)
Admission: EM | Admit: 2015-09-16 | Discharge: 2015-09-16 | Disposition: A | Discharge status: Home / Self Care | Payer: Medicare HMO | Attending: Emergency Medicine | Admitting: Emergency Medicine

## 2015-09-16 ENCOUNTER — Encounter (HOSPITAL_COMMUNITY): Payer: Self-pay | Admitting: Emergency Medicine

## 2015-09-16 DIAGNOSIS — K610 Anal abscess: Secondary | ICD-10-CM | POA: Diagnosis not present

## 2015-09-16 DIAGNOSIS — J449 Chronic obstructive pulmonary disease, unspecified: Secondary | ICD-10-CM | POA: Diagnosis not present

## 2015-09-16 DIAGNOSIS — F329 Major depressive disorder, single episode, unspecified: Secondary | ICD-10-CM | POA: Insufficient documentation

## 2015-09-16 DIAGNOSIS — K6289 Other specified diseases of anus and rectum: Secondary | ICD-10-CM | POA: Diagnosis not present

## 2015-09-16 DIAGNOSIS — Z79899 Other long term (current) drug therapy: Secondary | ICD-10-CM | POA: Insufficient documentation

## 2015-09-16 DIAGNOSIS — K625 Hemorrhage of anus and rectum: Secondary | ICD-10-CM | POA: Diagnosis present

## 2015-09-16 DIAGNOSIS — K611 Rectal abscess: Secondary | ICD-10-CM | POA: Diagnosis not present

## 2015-09-16 DIAGNOSIS — R69 Illness, unspecified: Secondary | ICD-10-CM | POA: Diagnosis not present

## 2015-09-16 DIAGNOSIS — J45909 Unspecified asthma, uncomplicated: Secondary | ICD-10-CM | POA: Insufficient documentation

## 2015-09-16 DIAGNOSIS — I10 Essential (primary) hypertension: Secondary | ICD-10-CM | POA: Insufficient documentation

## 2015-09-16 DIAGNOSIS — F17210 Nicotine dependence, cigarettes, uncomplicated: Secondary | ICD-10-CM | POA: Diagnosis not present

## 2015-09-16 DIAGNOSIS — K629 Disease of anus and rectum, unspecified: Secondary | ICD-10-CM

## 2015-09-16 MED ORDER — LIDOCAINE-EPINEPHRINE (PF) 2 %-1:200000 IJ SOLN
20.0000 mL | Freq: Once | INTRAMUSCULAR | Status: AC
  Administered 2015-09-16: 20 mL
  Filled 2015-09-16: qty 20

## 2015-09-16 NOTE — ED Notes (Signed)
Pt sent over by Dr. Karie Kirks for evaluation of a "growth" on patient's perineum. Pt states that PCP stated he did not think it was cyst. States it has been bleeding. Pt also has hemorrhoids.

## 2015-09-16 NOTE — ED Provider Notes (Signed)
CSN: [id number]     Arrival date & time 09/16/15  1557 History   First MD Initiated Contact with Patient 09/16/15 1621     Chief Complaint  Patient presents with  . Rectal Bleeding     HPI Patient presents to the emergency department for evaluation of perianal discomfort and pain after being evaluated at primary care doctor's office.  Patient reports that over the past several days she's had an area to her right buttock near rectum that has been causing discomfort and pain.  2 days ago she felt it "pop" and she had drainage from this area.  Skin the emergency department but was unable to stay and was never evaluated by a medical provider.  She presents now from the primary care doctor's office for further evaluation the ER.  No fevers or chills.  She does have a history of internal/external hemorrhoids but reports this feels different.  She has small amount of bleeding from this area.  She denies abdominal pain.  No fevers or chills.   Past Medical History  Diagnosis Date  . Hypertension   . Depression   . Anxiety   . Disc degeneration, lumbar   . Leukocytosis   . Thrombocytosis ([place])   . Lumbar disc disease   . GERD (gastroesophageal reflux disease)   . Leukemia ([place])   . Asthma   . COPD (chronic obstructive pulmonary disease) ([place])   . Shortness of breath    Past Surgical History  Procedure Laterality Date  . Total abdominal hysterectomy w/ bilateral salpingoophorectomy      age 82  . Rt breast biopsy    . Right knee surgery    . Esophagogastroduodenoscopy  08/08/2005    [id number] esophagogastroduodenoscopy.  . Colonoscopy  02/21/2007    [id number] papilla and internal hemorrhoids, diminutive rectal polyp/Left-sided diverticula (sigmoid) pedunculated polyps mid descending  . Esophagogastroduodenoscopy  2011    Dr. Gala Romney: normal esophagus, small hiatal hernia, duodenal diverticulum, negative H.pylori and celiac  . Colonoscopy  2008    Dr. Gala Romney: segmental biopsy negative for  microscopic colitis. Three polyps, one tubular adenoma. Surveillance due 2013.   . Colonoscopy with propofol N/A 03/25/2014    RMR: Rectal and colonic polyps removed as described above.  Colonic diverticulosis. CT abnormality likely artifactual in orgin    . 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 Abnormal gastic mucosa of uncertain significance-status post gasrtric biopsy  . Maloney dilation N/A 03/25/2014    Procedure: Venia Minks ESOPHAGEAL DILATION #54 Fr;  Surgeon: Daneil Dolin, MD;  Location: [facility];  Service: Endoscopy;  Laterality: N/A;  . Biopsy  03/25/2014    Procedure: GASTRIC BIOPSIES;  Surgeon: Daneil Dolin, MD;  Location: [facility];  Service: Endoscopy;;  . Polypectomy  03/25/2014    Procedure: SIGMOID AND RECTAL POLYPECTOMY;  Surgeon: Daneil Dolin, MD;  Location: [facility];  Service: Endoscopy;;  . Abdominal hysterectomy    . Brain surgery     Family History  Problem Relation Age of Onset  . Adopted: Yes  . Cancer Mother   . Cancer Brother   . Cancer Maternal Aunt   . Cancer Maternal Uncle   . Cancer Paternal Aunt   . Cancer Paternal Grandfather   . Colon cancer Brother 29   Social History  Substance Use Topics  . Smoking status: Current Every Day Smoker -- 1.00 packs/day for 30 years  Types: Cigarettes  . Smokeless tobacco: Never Used  . Alcohol Use: No     Comment: see HPI. Patient denies alcohol use at present   OB History    No data available     Review of Systems  All other systems reviewed and are negative.     Allergies  Codeine; Latex; and Other  Home Medications   Prior to Admission medications   Medication Sig Start Date End Date Taking? Authorizing Provider  ALPRAZolam Duanne Moron) 1 MG tablet Take 1 mg by mouth 3 (three) times daily as needed for anxiety.   Yes Historical Provider, MD  baclofen (LIORESAL) 10 MG tablet Take 10 mg by  mouth 3 (three) times daily. Muscle spasms   Yes Historical Provider, MD  DULoxetine (CYMBALTA) 60 MG capsule Take 60 mg by mouth daily.     Yes Historical Provider, MD  fentaNYL (DURAGESIC - DOSED MCG/HR) 50 MCG/HR Place 1 patch onto the skin every 3 (three) days. 08/13/12  Yes Historical Provider, MD  metoCLOPramide (REGLAN) 5 MG tablet Take 5 mg by mouth 4 (four) times daily -  before meals and at bedtime.    Yes Historical Provider, MD  metoprolol tartrate (LOPRESSOR) 25 MG tablet Take 25 mg by mouth daily.    Yes Historical Provider, MD  mometasone-formoterol (DULERA) 100-5 MCG/ACT AERO Inhale 2 puffs into the lungs 2 (two) times daily.   Yes Historical Provider, MD  omeprazole (PRILOSEC) 20 MG capsule Take 20 mg by mouth at bedtime.    Yes Historical Provider, MD  oxycodone (ROXICODONE) 30 MG immediate release tablet Take 30 mg by mouth every 4 (four) hours.    Yes Historical Provider, MD  PROAIR HFA 108 (90 BASE) MCG/ACT inhaler Inhale 2 puffs into the lungs daily as needed for wheezing or shortness of breath.  09/07/13  Yes Historical Provider, MD  promethazine (PHENERGAN) 25 MG tablet Take 1 tablet (25 mg total) by mouth every 8 (eight) hours as needed for nausea or vomiting. Patient taking differently: Take 25 mg by mouth every 4 (four) hours.  12/09/13  Yes Rolland Porter, MD  tiZANidine (ZANAFLEX) 4 MG tablet Take 1 tablet by mouth at bedtime as needed and may repeat dose one time if needed for muscle spasms.  08/23/12  Yes Historical Provider, MD  venlafaxine (EFFEXOR) 75 MG tablet Take 75 mg by mouth daily.   Yes Historical Provider, MD  zolpidem (AMBIEN) 10 MG tablet Take 10 mg by mouth at bedtime.     Yes Historical Provider, MD   BP 122/93 mmHg  Pulse 116  Temp(Src) 98.2 F (36.8 C) (Oral)  Resp 18  SpO2 97% Physical Exam  Constitutional: She is oriented to person, place, and time. She appears well-developed and well-nourished.  HENT:  Head: Normocephalic.  Eyes: EOM are normal.   Neck: Normal range of motion.  Pulmonary/Chest: Effort normal.  Abdominal: She exhibits no distension. There is no tenderness.  Genitourinary:  Small area of fluctuance near the perianal region but was more on the right buttock.  This area was somewhat bruised and fluctuance without drainage.  No signs of external hemorrhoids at this time.  Chaperone present during examination  Musculoskeletal: Normal range of motion.  Neurological: She is alert and oriented to person, place, and time.  Psychiatric: She has a normal mood and affect.  Nursing note and vitals reviewed.   ED Course  Procedures (including critical care time)    INCISION AND DRAINAGE Performed by: Hoy Morn Consent: Verbal  consent obtained. Risks and benefits: risks, benefits and alternatives were discussed Time out performed prior to procedure Type: fluctuance Body area: right perianal region Anesthesia: local infiltration Incision was made with a scalpel. Local anesthetic: lidocaine 2% with epinephrine Anesthetic total: 5 ml Complexity: simple Drainage: blood Drainage amount:small  Packing material: none Patient tolerance: Patient tolerated the procedure well with no immediate complications.      Labs Review Labs Reviewed - No data to display  Imaging Review No results found. I have personally reviewed and evaluated these images and lab results as part of my medical decision-making.   EKG Interpretation None      MDM   Final diagnoses:  None    Incision and drainage was made over this area but only a small amount of blood was obtained.  There was no purulent drainage.  No signs of surrounding erythema or warmth.  No signs of cellulitis.  No purulent drainage obtained therefore the patient does not need to be on antibiotics at this time.  Recommended warm water soaks twice a day with the understanding that she would return the emergency department for any new or worsening  symptoms    Jola Schmidt, MD 09/18/15 [phone]

## 2015-09-16 NOTE — Discharge Instructions (Signed)
Please call your gastroenterologist for followup. Perform warm baths soaks twice a day. Return to the ER for new or worsening symptoms

## 2015-09-23 DIAGNOSIS — E6609 Other obesity due to excess calories: Secondary | ICD-10-CM | POA: Diagnosis not present

## 2015-09-23 DIAGNOSIS — Z6834 Body mass index (BMI) 34.0-34.9, adult: Secondary | ICD-10-CM | POA: Diagnosis not present

## 2015-09-23 DIAGNOSIS — L03818 Cellulitis of other sites: Secondary | ICD-10-CM | POA: Diagnosis not present

## 2015-09-23 DIAGNOSIS — Z1389 Encounter for screening for other disorder: Secondary | ICD-10-CM | POA: Diagnosis not present

## 2015-10-17 DIAGNOSIS — E538 Deficiency of other specified B group vitamins: Secondary | ICD-10-CM | POA: Diagnosis not present

## 2015-10-17 DIAGNOSIS — E6609 Other obesity due to excess calories: Secondary | ICD-10-CM | POA: Diagnosis not present

## 2015-10-17 DIAGNOSIS — G894 Chronic pain syndrome: Secondary | ICD-10-CM | POA: Diagnosis not present

## 2015-10-17 DIAGNOSIS — K120 Recurrent oral aphthae: Secondary | ICD-10-CM | POA: Diagnosis not present

## 2015-10-17 DIAGNOSIS — Z6835 Body mass index (BMI) 35.0-35.9, adult: Secondary | ICD-10-CM | POA: Diagnosis not present

## 2015-11-08 DIAGNOSIS — Z6835 Body mass index (BMI) 35.0-35.9, adult: Secondary | ICD-10-CM | POA: Diagnosis not present

## 2015-11-08 DIAGNOSIS — G894 Chronic pain syndrome: Secondary | ICD-10-CM | POA: Diagnosis not present

## 2015-11-08 DIAGNOSIS — E538 Deficiency of other specified B group vitamins: Secondary | ICD-10-CM | POA: Diagnosis not present

## 2015-11-08 DIAGNOSIS — T148 Other injury of unspecified body region: Secondary | ICD-10-CM | POA: Diagnosis not present

## 2015-12-06 ENCOUNTER — Encounter (HOSPITAL_COMMUNITY): Payer: Self-pay

## 2015-12-06 ENCOUNTER — Emergency Department (HOSPITAL_COMMUNITY)
Admission: EM | Admit: 2015-12-06 | Discharge: 2015-12-06 | Disposition: A | Discharge status: Home / Self Care | Payer: Medicare HMO | Attending: Emergency Medicine | Admitting: Emergency Medicine

## 2015-12-06 DIAGNOSIS — R030 Elevated blood-pressure reading, without diagnosis of hypertension: Secondary | ICD-10-CM | POA: Diagnosis not present

## 2015-12-06 DIAGNOSIS — M5442 Lumbago with sciatica, left side: Secondary | ICD-10-CM | POA: Diagnosis not present

## 2015-12-06 DIAGNOSIS — J45909 Unspecified asthma, uncomplicated: Secondary | ICD-10-CM | POA: Diagnosis not present

## 2015-12-06 DIAGNOSIS — Z79899 Other long term (current) drug therapy: Secondary | ICD-10-CM | POA: Diagnosis not present

## 2015-12-06 DIAGNOSIS — F17210 Nicotine dependence, cigarettes, uncomplicated: Secondary | ICD-10-CM | POA: Insufficient documentation

## 2015-12-06 DIAGNOSIS — F329 Major depressive disorder, single episode, unspecified: Secondary | ICD-10-CM | POA: Diagnosis not present

## 2015-12-06 DIAGNOSIS — J449 Chronic obstructive pulmonary disease, unspecified: Secondary | ICD-10-CM | POA: Diagnosis not present

## 2015-12-06 DIAGNOSIS — I10 Essential (primary) hypertension: Secondary | ICD-10-CM | POA: Diagnosis not present

## 2015-12-06 DIAGNOSIS — R69 Illness, unspecified: Secondary | ICD-10-CM | POA: Diagnosis not present

## 2015-12-06 DIAGNOSIS — M5489 Other dorsalgia: Secondary | ICD-10-CM | POA: Diagnosis not present

## 2015-12-06 DIAGNOSIS — M545 Low back pain: Secondary | ICD-10-CM | POA: Diagnosis present

## 2015-12-06 MED ORDER — HYDROMORPHONE HCL 1 MG/ML IJ SOLN
1.0000 mg | Freq: Once | INTRAMUSCULAR | Status: AC
  Administered 2015-12-06: 1 mg via INTRAVENOUS
  Filled 2015-12-06: qty 1

## 2015-12-06 MED ORDER — PROMETHAZINE HCL 25 MG PO TABS: 25.0000 mg | ORAL_TABLET | Freq: Four times a day (QID) | ORAL | Status: DC | PRN

## 2015-12-06 MED ORDER — SODIUM CHLORIDE 0.9 % IV BOLUS (SEPSIS)
250.0000 mL | Freq: Once | INTRAVENOUS | Status: AC
  Administered 2015-12-06: 250 mL via INTRAVENOUS

## 2015-12-06 MED ORDER — ONDANSETRON HCL 4 MG/2ML IJ SOLN
4.0000 mg | Freq: Once | INTRAMUSCULAR | Status: AC
  Administered 2015-12-06: 4 mg via INTRAVENOUS
  Filled 2015-12-06: qty 2

## 2015-12-06 MED ORDER — SODIUM CHLORIDE 0.9 % IV SOLN
INTRAVENOUS | Status: DC
  Administered 2015-12-06: 19:00:00 via INTRAVENOUS

## 2015-12-06 NOTE — ED Provider Notes (Signed)
CSN: [id number]     Arrival date & time 12/06/15  1828 History   First MD Initiated Contact with Patient 12/06/15 1832     Chief Complaint  Patient presents with  . Back Pain     (Consider location/radiation/quality/duration/timing/severity/associated sxs/prior Treatment) Patient is a 55 y.o. female presenting with back pain. The history is provided by the patient.  Back Pain Associated symptoms: numbness   Associated symptoms: no abdominal pain, no chest pain, no dysuria, no fever and no headaches   Patient with complaint of increase lower back pain with radiation of pain into the posterior aspect of the left leg was started about 3 days ago. Exacerbated by recent move. Patient has numbness to the left foot. No fall or injury. No incontinence. Patient has chronic pain medicines at home but has been vomiting them.  Past Medical History  Diagnosis Date  . Hypertension   . Depression   . Anxiety   . Disc degeneration, lumbar   . Leukocytosis   . Thrombocytosis ([place])   . Lumbar disc disease   . GERD (gastroesophageal reflux disease)   . Leukemia ([place])   . Asthma   . COPD (chronic obstructive pulmonary disease) ([place])   . Shortness of breath    Past Surgical History  Procedure Laterality Date  . Total abdominal hysterectomy w/ bilateral salpingoophorectomy      age 71  . Rt breast biopsy    . Right knee surgery    . Esophagogastroduodenoscopy  08/08/2005    [id number] esophagogastroduodenoscopy.  . Colonoscopy  02/21/2007    [id number] papilla and internal hemorrhoids, diminutive rectal polyp/Left-sided diverticula (sigmoid) pedunculated polyps mid descending  . Esophagogastroduodenoscopy  2011    Dr. Gala Romney: normal esophagus, small hiatal hernia, duodenal diverticulum, negative H.pylori and celiac  . Colonoscopy  2008    Dr. Gala Romney: segmental biopsy negative for microscopic colitis. Three polyps, one tubular adenoma. Surveillance due 2013.   . Colonoscopy with propofol N/A  03/25/2014    RMR: Rectal and colonic polyps removed as described above.  Colonic diverticulosis. CT abnormality likely artifactual in orgin    . 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 Abnormal gastic mucosa of uncertain significance-status post gasrtric biopsy  . Maloney dilation N/A 03/25/2014    Procedure: Venia Minks ESOPHAGEAL DILATION #54 Fr;  Surgeon: Daneil Dolin, MD;  Location: [facility];  Service: Endoscopy;  Laterality: N/A;  . Biopsy  03/25/2014    Procedure: GASTRIC BIOPSIES;  Surgeon: Daneil Dolin, MD;  Location: [facility];  Service: Endoscopy;;  . Polypectomy  03/25/2014    Procedure: SIGMOID AND RECTAL POLYPECTOMY;  Surgeon: Daneil Dolin, MD;  Location: [facility];  Service: Endoscopy;;  . Abdominal hysterectomy    . Brain surgery     Family History  Problem Relation Age of Onset  . Adopted: Yes  . Cancer Mother   . Cancer Brother   . Cancer Maternal Aunt   . Cancer Maternal Uncle   . Cancer Paternal Aunt   . Cancer Paternal Grandfather   . Colon cancer Brother 84   Social History  Substance Use Topics  . Smoking status: Current Every Day Smoker -- 1.00 packs/day for 30 years    Types: Cigarettes  . Smokeless tobacco: Never Used  . Alcohol Use: No     Comment: see HPI. Patient denies alcohol use at present   OB History    No data  available     Review of Systems  Constitutional: Negative for fever.  HENT: Negative for congestion.   Eyes: Negative for visual disturbance.  Respiratory: Negative for shortness of breath.   Cardiovascular: Negative for chest pain.  Gastrointestinal: Negative for abdominal pain.  Genitourinary: Negative for dysuria.  Musculoskeletal: Positive for back pain.  Skin: Negative for rash.  Neurological: Positive for numbness. Negative for headaches.  Hematological: Does not bruise/bleed easily.  Psychiatric/Behavioral: Positive  for confusion.      Allergies  Codeine; Latex; and Other  Home Medications   Prior to Admission medications   Medication Sig Start Date End Date Taking? Authorizing Provider  ALPRAZolam Duanne Moron) 1 MG tablet Take 1 mg by mouth 3 (three) times daily as needed for anxiety.   Yes Historical Provider, MD  baclofen (LIORESAL) 10 MG tablet Take 10 mg by mouth 3 (three) times daily. Muscle spasms   Yes Historical Provider, MD  DULoxetine (CYMBALTA) 60 MG capsule Take 60 mg by mouth daily.     Yes Historical Provider, MD  estradiol (ESTRACE) 1 MG tablet Take 1 mg by mouth daily.   Yes Historical Provider, MD  fentaNYL (DURAGESIC - DOSED MCG/HR) 50 MCG/HR Place 1 patch onto the skin every 3 (three) days. 08/13/12  Yes Historical Provider, MD  metoCLOPramide (REGLAN) 5 MG tablet Take 5 mg by mouth 4 (four) times daily -  before meals and at bedtime.    Yes Historical Provider, MD  metoprolol tartrate (LOPRESSOR) 25 MG tablet Take 25 mg by mouth daily.    Yes Historical Provider, MD  mometasone-formoterol (DULERA) 100-5 MCG/ACT AERO Inhale 2 puffs into the lungs 2 (two) times daily.   Yes Historical Provider, MD  omeprazole (PRILOSEC) 20 MG capsule Take 20 mg by mouth at bedtime.    Yes Historical Provider, MD  oxycodone (ROXICODONE) 30 MG immediate release tablet Take 30 mg by mouth every 4 (four) hours.    Yes Historical Provider, MD  PROAIR HFA 108 (90 BASE) MCG/ACT inhaler Inhale 2 puffs into the lungs daily as needed for wheezing or shortness of breath.  09/07/13  Yes Historical Provider, MD  promethazine (PHENERGAN) 25 MG tablet Take 1 tablet (25 mg total) by mouth every 8 (eight) hours as needed for nausea or vomiting. Patient taking differently: Take 25 mg by mouth every 4 (four) hours.  12/09/13  Yes Rolland Porter, MD  tiZANidine (ZANAFLEX) 4 MG tablet Take 4 mg by mouth at bedtime.  12/06/15  Yes Historical Provider, MD  venlafaxine (EFFEXOR) 75 MG tablet Take 75 mg by mouth daily.   Yes Historical  Provider, MD  zolpidem (AMBIEN) 10 MG tablet Take 10 mg by mouth at bedtime.     Yes Historical Provider, MD  promethazine (PHENERGAN) 25 MG tablet Take 1 tablet (25 mg total) by mouth every 6 (six) hours as needed. 12/06/15   Fredia Sorrow, MD   BP 142/91 mmHg  Pulse 80  Temp(Src) 98.2 F (36.8 C) (Oral)  Resp 20  Ht 5\' 6"  (1.676 m)  Wt 77.111 kg  BMI 27.45 kg/m2  SpO2 99% Physical Exam  Constitutional: She is oriented to person, place, and time. She appears well-developed and well-nourished. No distress.  HENT:  Head: Normocephalic and atraumatic.  Mouth/Throat: Oropharynx is clear and moist.  Eyes: Conjunctivae and EOM are normal. Pupils are equal, round, and reactive to light.  Neck: Normal range of motion.  Cardiovascular: Normal rate, regular rhythm and normal heart sounds.   Pulmonary/Chest: Effort normal  and breath sounds normal. No respiratory distress.  Abdominal: Soft. Bowel sounds are normal. There is no tenderness.  Musculoskeletal: Normal range of motion.  Good range of motion at the ankle and toes of the left foot. Her sounds. Pulse 2+.  Neurological: She is alert and oriented to person, place, and time. No cranial nerve deficit. She exhibits normal muscle tone. Coordination normal.  Except for some numbness to the lateral aspect of the left foot dorsalis pedis pulses 2+.  Skin: Skin is warm.  Nursing note and vitals reviewed.   ED Course  Procedures (including critical care time) Labs Review Labs Reviewed - No data to display  Imaging Review No results found. I have personally reviewed and evaluated these images and lab results as part of my medical decision-making.   EKG Interpretation None      MDM   Final diagnoses:  Left-sided low back pain with left-sided sciatica    Patient with an exacerbation of her back pain with left-sided sciatica. Numbness to the lateral aspect of her left foot. Dorsalis pedis pulses 2+. No significant weakness. No fall  or injury. Will treat symptomatically have her follow-up with her regular doctors.  Patient improved here with IV fluids and some IV pain medicine. Will be discharged home with antinausea medicine.  Fredia Sorrow, MD 12/06/15 2039

## 2015-12-06 NOTE — ED Notes (Signed)
Complain of severe lower back pain and numbness to left leg. States three toes on her left foot is numb

## 2015-12-06 NOTE — Discharge Instructions (Signed)
Symptoms consistent with an exacerbation of your back pain with left-sided sciatica. Rest off her feet as much as possible. Take pain medicine she have at home. Take the antinausea medicine provided. Follow-up with your doctor if symptoms aren't improving in 2 weeks and then MRI of the back would be appropriate.

## 2015-12-26 ENCOUNTER — Other Ambulatory Visit (HOSPITAL_COMMUNITY): Payer: Self-pay | Admitting: Internal Medicine

## 2015-12-26 ENCOUNTER — Ambulatory Visit (HOSPITAL_COMMUNITY)
Admission: RE | Admit: 2015-12-26 | Discharge: 2015-12-26 | Disposition: A | Discharge status: Home / Self Care | Payer: Medicare HMO | Source: Ambulatory Visit | Attending: Internal Medicine | Admitting: Internal Medicine

## 2015-12-26 ENCOUNTER — Encounter (HOSPITAL_COMMUNITY): Payer: Self-pay

## 2015-12-26 DIAGNOSIS — I10 Essential (primary) hypertension: Secondary | ICD-10-CM | POA: Diagnosis not present

## 2015-12-26 DIAGNOSIS — G8929 Other chronic pain: Secondary | ICD-10-CM | POA: Diagnosis not present

## 2015-12-26 DIAGNOSIS — G834 Cauda equina syndrome: Secondary | ICD-10-CM

## 2015-12-26 DIAGNOSIS — M5126 Other intervertebral disc displacement, lumbar region: Secondary | ICD-10-CM | POA: Diagnosis not present

## 2015-12-26 DIAGNOSIS — E538 Deficiency of other specified B group vitamins: Secondary | ICD-10-CM | POA: Diagnosis not present

## 2015-12-26 DIAGNOSIS — Z6834 Body mass index (BMI) 34.0-34.9, adult: Secondary | ICD-10-CM | POA: Diagnosis not present

## 2015-12-26 DIAGNOSIS — M5416 Radiculopathy, lumbar region: Secondary | ICD-10-CM | POA: Diagnosis not present

## 2015-12-27 ENCOUNTER — Ambulatory Visit (HOSPITAL_COMMUNITY): Payer: Medicare HMO

## 2015-12-27 ENCOUNTER — Other Ambulatory Visit (HOSPITAL_COMMUNITY): Payer: Self-pay | Admitting: Internal Medicine

## 2015-12-27 ENCOUNTER — Ambulatory Visit (HOSPITAL_COMMUNITY)
Admission: RE | Admit: 2015-12-27 | Discharge: 2015-12-27 | Disposition: A | Discharge status: Home / Self Care | Payer: Medicare HMO | Source: Ambulatory Visit | Attending: Internal Medicine | Admitting: Internal Medicine

## 2015-12-27 DIAGNOSIS — M5126 Other intervertebral disc displacement, lumbar region: Secondary | ICD-10-CM

## 2015-12-27 DIAGNOSIS — G834 Cauda equina syndrome: Secondary | ICD-10-CM

## 2015-12-27 DIAGNOSIS — M5127 Other intervertebral disc displacement, lumbosacral region: Secondary | ICD-10-CM | POA: Diagnosis not present

## 2015-12-29 DIAGNOSIS — Z6831 Body mass index (BMI) 31.0-31.9, adult: Secondary | ICD-10-CM | POA: Diagnosis not present

## 2015-12-29 DIAGNOSIS — M5127 Other intervertebral disc displacement, lumbosacral region: Secondary | ICD-10-CM | POA: Diagnosis not present

## 2016-01-03 DIAGNOSIS — M5127 Other intervertebral disc displacement, lumbosacral region: Secondary | ICD-10-CM | POA: Diagnosis not present

## 2016-01-03 DIAGNOSIS — M5117 Intervertebral disc disorders with radiculopathy, lumbosacral region: Secondary | ICD-10-CM | POA: Diagnosis not present

## 2016-03-08 DIAGNOSIS — D473 Essential (hemorrhagic) thrombocythemia: Secondary | ICD-10-CM | POA: Diagnosis not present

## 2016-03-08 DIAGNOSIS — G894 Chronic pain syndrome: Secondary | ICD-10-CM | POA: Diagnosis not present

## 2016-03-08 DIAGNOSIS — Z6834 Body mass index (BMI) 34.0-34.9, adult: Secondary | ICD-10-CM | POA: Diagnosis not present

## 2016-03-08 DIAGNOSIS — I10 Essential (primary) hypertension: Secondary | ICD-10-CM | POA: Diagnosis not present

## 2016-03-08 DIAGNOSIS — Z1389 Encounter for screening for other disorder: Secondary | ICD-10-CM | POA: Diagnosis not present

## 2016-05-18 DIAGNOSIS — E538 Deficiency of other specified B group vitamins: Secondary | ICD-10-CM | POA: Diagnosis not present

## 2016-05-18 DIAGNOSIS — G894 Chronic pain syndrome: Secondary | ICD-10-CM | POA: Diagnosis not present

## 2016-05-18 DIAGNOSIS — E6609 Other obesity due to excess calories: Secondary | ICD-10-CM | POA: Diagnosis not present

## 2016-05-18 DIAGNOSIS — Z1389 Encounter for screening for other disorder: Secondary | ICD-10-CM | POA: Diagnosis not present

## 2016-05-18 DIAGNOSIS — R69 Illness, unspecified: Secondary | ICD-10-CM | POA: Diagnosis not present

## 2016-05-18 DIAGNOSIS — Z6836 Body mass index (BMI) 36.0-36.9, adult: Secondary | ICD-10-CM | POA: Diagnosis not present

## 2016-07-04 DIAGNOSIS — G894 Chronic pain syndrome: Secondary | ICD-10-CM | POA: Diagnosis not present

## 2016-07-04 DIAGNOSIS — I10 Essential (primary) hypertension: Secondary | ICD-10-CM | POA: Diagnosis not present

## 2016-07-04 DIAGNOSIS — H6692 Otitis media, unspecified, left ear: Secondary | ICD-10-CM | POA: Diagnosis not present

## 2016-07-04 DIAGNOSIS — Z6834 Body mass index (BMI) 34.0-34.9, adult: Secondary | ICD-10-CM | POA: Diagnosis not present

## 2016-07-04 DIAGNOSIS — E538 Deficiency of other specified B group vitamins: Secondary | ICD-10-CM | POA: Diagnosis not present

## 2016-09-27 DIAGNOSIS — I10 Essential (primary) hypertension: Secondary | ICD-10-CM | POA: Diagnosis not present

## 2016-09-27 DIAGNOSIS — R609 Edema, unspecified: Secondary | ICD-10-CM | POA: Diagnosis not present

## 2016-09-27 DIAGNOSIS — Z6837 Body mass index (BMI) 37.0-37.9, adult: Secondary | ICD-10-CM | POA: Diagnosis not present

## 2016-09-27 DIAGNOSIS — M47816 Spondylosis without myelopathy or radiculopathy, lumbar region: Secondary | ICD-10-CM | POA: Diagnosis not present

## 2016-09-27 DIAGNOSIS — G894 Chronic pain syndrome: Secondary | ICD-10-CM | POA: Diagnosis not present

## 2016-09-27 DIAGNOSIS — Z1389 Encounter for screening for other disorder: Secondary | ICD-10-CM | POA: Diagnosis not present

## 2016-10-02 ENCOUNTER — Encounter: Payer: Self-pay | Admitting: Internal Medicine

## 2016-10-03 DIAGNOSIS — Z6836 Body mass index (BMI) 36.0-36.9, adult: Secondary | ICD-10-CM | POA: Diagnosis not present

## 2016-10-03 DIAGNOSIS — E6609 Other obesity due to excess calories: Secondary | ICD-10-CM | POA: Diagnosis not present

## 2016-10-03 DIAGNOSIS — D45 Polycythemia vera: Secondary | ICD-10-CM | POA: Diagnosis not present

## 2016-10-03 DIAGNOSIS — M5431 Sciatica, right side: Secondary | ICD-10-CM | POA: Diagnosis not present

## 2016-10-03 DIAGNOSIS — Z0001 Encounter for general adult medical examination with abnormal findings: Secondary | ICD-10-CM | POA: Diagnosis not present

## 2016-10-03 DIAGNOSIS — E669 Obesity, unspecified: Secondary | ICD-10-CM | POA: Diagnosis not present

## 2016-10-05 ENCOUNTER — Other Ambulatory Visit (HOSPITAL_COMMUNITY): Payer: Self-pay | Admitting: Internal Medicine

## 2016-10-05 DIAGNOSIS — E748 Other specified disorders of carbohydrate metabolism: Secondary | ICD-10-CM | POA: Diagnosis not present

## 2016-10-05 DIAGNOSIS — E46 Unspecified protein-calorie malnutrition: Secondary | ICD-10-CM | POA: Diagnosis not present

## 2016-10-05 DIAGNOSIS — E039 Hypothyroidism, unspecified: Secondary | ICD-10-CM

## 2016-10-05 DIAGNOSIS — R945 Abnormal results of liver function studies: Secondary | ICD-10-CM | POA: Diagnosis not present

## 2016-10-05 DIAGNOSIS — E6609 Other obesity due to excess calories: Secondary | ICD-10-CM | POA: Diagnosis not present

## 2016-10-09 ENCOUNTER — Encounter: Payer: Self-pay | Admitting: Internal Medicine

## 2016-10-23 ENCOUNTER — Ambulatory Visit: Payer: Managed Care, Other (non HMO) | Admitting: Gastroenterology

## 2016-10-30 ENCOUNTER — Ambulatory Visit (HOSPITAL_COMMUNITY): Admission: RE | Admit: 2016-10-30 | Payer: Medicare HMO | Source: Ambulatory Visit

## 2016-11-20 ENCOUNTER — Ambulatory Visit (INDEPENDENT_AMBULATORY_CARE_PROVIDER_SITE_OTHER): Payer: Medicare HMO | Admitting: Gastroenterology

## 2016-11-20 ENCOUNTER — Encounter: Payer: Self-pay | Admitting: Gastroenterology

## 2016-11-20 VITALS — BP 161/97 | HR 119 | Temp 97.0°F | Ht 66.0 in | Wt 195.8 lb

## 2016-11-20 DIAGNOSIS — R7989 Other specified abnormal findings of blood chemistry: Secondary | ICD-10-CM | POA: Insufficient documentation

## 2016-11-20 DIAGNOSIS — R945 Abnormal results of liver function studies: Secondary | ICD-10-CM | POA: Diagnosis not present

## 2016-11-20 DIAGNOSIS — R1011 Right upper quadrant pain: Secondary | ICD-10-CM

## 2016-11-20 DIAGNOSIS — K5903 Drug induced constipation: Secondary | ICD-10-CM | POA: Diagnosis not present

## 2016-11-20 MED ORDER — LUBIPROSTONE 24 MCG PO CAPS: 24.0000 ug | ORAL_CAPSULE | Freq: Two times a day (BID) | ORAL | 5 refills | Status: DC

## 2016-11-20 NOTE — Assessment & Plan Note (Signed)
Abnormal alkaline phosphatase and AST. Patient complains of right upper quadrant pain. No significant recent medication changes. Update imaging via abdominal ultrasound. Further serologies to be done. Discussed with patient today that hypoalbuminemia can often be seen if protein is being lost in the urine, liver is not working well, patient not absorbing protein in her diet. I will check to see results from her 24-hour urinary test. Also indicated to patient that abnormal alkaline phosphatase can be from other sources besides liver but we will work to determine whether the liver is the etiology. Further recommendations to follow pending labs and ultrasound.

## 2016-11-20 NOTE — Progress Notes (Signed)
Primary Care Physician:  Redmond School, MD  Primary Gastroenterologist:  Garfield Cornea, MD   Chief Complaint  Patient presents with  . abnormal liver labs  . Fatigue  . Abdominal Pain    right side    HPI:  [name] is a 56 y.o. female here For further evaluation of abdominal pain and abnormal LFTs at the request of PCP. Patient last seen in our office in October 2015. She has a history of chronic abdominal pain. At the time she was complaining of right lower quadrant pain. Abnormal colon on CT (focal area of colon wall thickening and narrowing in the transverse colon) at that time. She underwent a colonoscopy revealing rectal and colonic polyps (hyperplastic), colonic diverticulosis, no abnormality seen to correlate with CT findings felt to be artifactual. EGD showed small hiatal hernia, duodenal diverticulum, negative for H. pylori and celiac disease.  Patient presents today complaining of feeling poorly for the last 7 months. Complains of fatigue and nausea. Right upper quadrant pain worse with meals. Sometimes the pain radiates throughout the entire upper abdomen. She states she was told she had a low protein level on her lab work. She's been "eating plenty of meat" and Carnation Instant Breakfast twice daily even before that. Heartburn well-controlled on current PPI regimen. Denies dysphagia. She has chronic constipation, may go 3 or 4 days without a BM. Takes Dulcolax as needed which causes abdominal cramping.  Labs back in April showed TSH of 4.6. She was put on Synthroid without any improvement in her fatigue. Hemoglobin A1c 5.2. Alkaline phosphatase 547, AST 43, ALT and total bilirubin normal, albumin 2.8. White blood cell count 11,400, hemoglobin 13.1, platelets 3 and 35,000. Repeat LFTs in May with alkaline phosphatase 364, AST 33. Viral markers negative. 24-hour urine ordered but I cannot see the results.  Back in 2015 her alkaline phosphatase was 229, AST 53. Last liver  imaging in 2015 with fatty liver.  Current Outpatient Prescriptions  Medication Sig Dispense Refill  . ALPRAZolam (XANAX) 1 MG tablet Take 1 mg by mouth 3 (three) times daily as needed for anxiety.    . baclofen (LIORESAL) 10 MG tablet Take 10 mg by mouth 3 (three) times daily. Muscle spasms    . DULoxetine (CYMBALTA) 60 MG capsule Take 60 mg by mouth daily.      Marland Kitchen estradiol (ESTRACE) 1 MG tablet Take 1 mg by mouth daily.    . fentaNYL (DURAGESIC - DOSED MCG/HR) 25 MCG/HR patch Place 1 patch onto the skin every 3 (three) days.    . metoCLOPramide (REGLAN) 5 MG tablet Take 5 mg by mouth 4 (four) times daily -  before meals and at bedtime.     . metoprolol tartrate (LOPRESSOR) 25 MG tablet Take 25 mg by mouth daily.     . mometasone-formoterol (DULERA) 100-5 MCG/ACT AERO Inhale 2 puffs into the lungs 2 (two) times daily.    Marland Kitchen omeprazole (PRILOSEC) 20 MG capsule Take 20 mg by mouth at bedtime.     Marland Kitchen oxycodone (ROXICODONE) 30 MG immediate release tablet Take 60 mg by mouth every 4 (four) hours.     Marland Kitchen PROAIR HFA 108 (90 BASE) MCG/ACT inhaler Inhale 2 puffs into the lungs daily as needed for wheezing or shortness of breath.     . promethazine (PHENERGAN) 25 MG tablet Take 1 tablet (25 mg total) by mouth every 8 (eight) hours as needed for nausea or vomiting. (Patient taking differently: Take 25 mg by mouth every 4 (  four) hours. ) 15 tablet 0  . promethazine (PHENERGAN) 25 MG tablet Take 1 tablet (25 mg total) by mouth every 6 (six) hours as needed. 12 tablet 1  . tiZANidine (ZANAFLEX) 4 MG tablet Take 4 mg by mouth at bedtime.     Marland Kitchen venlafaxine (EFFEXOR) 75 MG tablet Take 75 mg by mouth daily.    Marland Kitchen zolpidem (AMBIEN) 10 MG tablet Take 10 mg by mouth at bedtime.      Marland Kitchen levothyroxine (SYNTHROID, LEVOTHROID) 25 MCG tablet Take 25 mcg by mouth daily.    .       No current facility-administered medications for this visit.     Allergies as of 11/20/2016 - Review Complete 11/20/2016  Allergen Reaction  Noted  . Codeine Hives and Other (See Comments) 03/03/2008  . Latex Hives and Itching 02/23/2011  . Lyrica [pregabalin] Hives 11/20/2016  . Nsaids  11/20/2016  . Other Hives and Itching 02/23/2013    Past Medical History:  Diagnosis Date  . Anxiety   . Asthma   . COPD (chronic obstructive pulmonary disease) ([place])   . Depression   . Disc degeneration, lumbar   . GERD (gastroesophageal reflux disease)   . Hypertension   . Leukocytosis   . Lumbar disc disease   . Shortness of breath   . Thrombocytosis ([place])     Past Surgical History:  Procedure Laterality Date  . ABDOMINAL HYSTERECTOMY    . BIOPSY  03/25/2014   Procedure: GASTRIC BIOPSIES;  Surgeon: Daneil Dolin, MD;  Location: [facility];  Service: Endoscopy;;  . BRAIN SURGERY    . COLONOSCOPY  02/21/2007   GEX:BMWU papilla and internal hemorrhoids, diminutive rectal polyp/Left-sided diverticula (sigmoid) pedunculated polyps mid descending  . COLONOSCOPY  2008   Dr. Gala Romney: segmental biopsy negative for microscopic colitis. Three polyps, one tubular adenoma. Surveillance due 2013.   Marland Kitchen COLONOSCOPY WITH PROPOFOL N/A 03/25/2014   RMR: Rectal and colonic polyps removed as described above.  Colonic diverticulosis. CT abnormality likely artifactual in orgin    . ESOPHAGOGASTRODUODENOSCOPY  08/08/2005   XLK:GMWNUU esophagogastroduodenoscopy.  . ESOPHAGOGASTRODUODENOSCOPY  2011   Dr. Gala Romney: normal esophagus, small hiatal hernia, duodenal diverticulum, negative H.pylori and celiac  . 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 Abnormal gastic mucosa of uncertain significance-status post gasrtric biopsy  . MALONEY DILATION N/A 03/25/2014   Procedure: MALONEY ESOPHAGEAL DILATION #54 Fr;  Surgeon: Daneil Dolin, MD;  Location: [facility];  Service: Endoscopy;  Laterality: N/A;  . POLYPECTOMY  03/25/2014   Procedure: SIGMOID AND RECTAL  POLYPECTOMY;  Surgeon: Daneil Dolin, MD;  Location: [facility];  Service: Endoscopy;;  . right knee surgery    . rt breast biopsy    . TOTAL ABDOMINAL HYSTERECTOMY W/ BILATERAL SALPINGOOPHORECTOMY     age 14    Family History  Problem Relation Age of Onset  . Adopted: Yes  . Pancreatic cancer Mother   . Hepatitis C Brother   . Liver cancer Brother        suicide  . Pancreatic cancer Maternal Aunt   . Kidney cancer Maternal Uncle   . Colon cancer Paternal Aunt   . Kidney cancer Paternal Grandfather   . Colon cancer Brother 44  . Leukemia Sister        1/2 sister  . Pancreatic cancer Paternal Grandmother   . Pancreatic cancer Maternal Uncle     Social History  Social History  . Marital status: Single    Spouse name: N/A  . Number of children: N/A  . Years of education: N/A   Occupational History  . Not on file.   Social History Main Topics  . Smoking status: Current Every Day Smoker    Packs/day: 1.00    Years: 30.00    Types: Cigarettes  . Smokeless tobacco: Never Used  . Alcohol use No     Comment: see HPI. Patient denies alcohol use at present  . Drug use: No  . Sexual activity: Not on file   Other Topics Concern  . Not on file   Social History Narrative  . No narrative on file      ROS:  General: Negative for anorexia, weight loss, fever, chills,Weakness. Complains of fatigue. Weight is up 20 pounds in the last 3 years. Eyes: Negative for vision changes.  ENT: Negative for hoarseness, difficulty swallowing , nasal congestion. CV: Negative for chest pain, angina, palpitations, dyspnea on exertion, peripheral edema.  Respiratory: Negative for dyspnea at rest, dyspnea on exertion, cough, sputum, wheezing.  GI: See history of present illness. GU:  Negative for dysuria, hematuria, urinary incontinence, urinary frequency, nocturnal urination.  MS: Chronic back pain  Derm: Negative for rash or itching.  Neuro: Negative for weakness, abnormal sensation,  seizure, frequent headaches, memory loss, confusion.  Psych: Negative for anxiety, depression, suicidal ideation, hallucinations.  Endo: Negative for unusual weight change.  Heme: Negative for bruising or bleeding. Allergy: Negative for rash or hives.    Physical Examination:  BP (!) 161/97   Pulse (!) 119   Temp 97 F (36.1 C) (Oral)   Ht 5\' 6"  (1.676 m)   Wt 195 lb 12.8 oz (88.8 kg)   BMI 31.60 kg/m    General: Well-nourished, well-developed in no acute distress. Somewhat disheveled. Accompanied by neighbor. Head: Normocephalic, atraumatic.   Eyes: Conjunctiva pink, no icterus. Mouth: Oropharyngeal mucosa moist and pink , no lesions erythema or exudate. Neck: Supple without thyromegaly, masses, or lymphadenopathy.  Lungs: Clear to auscultation bilaterally.  Heart: Regular rate and rhythm, no murmurs rubs or gallops.  Abdomen: Bowel sounds are normal, mild diffuse abdominal tenderness with moderate tenderness in the right upper quadrant/epigastrium, nondistended, no hepatosplenomegaly or masses, no abdominal bruits or    hernia , no rebound or guarding.   Rectal: Not performed Extremities: No lower extremity edema. No clubbing or deformities.  Neuro: Alert and oriented x 4 , grossly normal neurologically.  Skin: Warm and dry, no rash or jaundice.   Psych: Alert and cooperative, normal mood and affect.  Labs: Labs from 5 2018 Amylase 37, hemoglobin A1c 5.2, alkaline phosphatase 364, AST 33, ALT 16, total bilirubin 0.3, hepatitis A IgM negative, hepatitis B surface antigen negative, hepatitis B core IgM negative, hepatitis C antibody less than 0.1 negative, lipase 28,   Labs from April 2018 white blood cell count 11,400, hemoglobin 13.1, hematocrit 38, platelets 335,000 Total bilirubin 0.5, alkaline phosphatase 547, AST 43, ALT 16, albumin 2.8, random omeprazole level XIX.4, TSH 4.6  Imaging Studies: No results found.

## 2016-11-20 NOTE — Assessment & Plan Note (Addendum)
Chronic constipation in the setting of opioid use. Begin an Amitiza 24 g twice daily with food. Samples and prescription provided. She will let me know if medication is not helping.

## 2016-11-20 NOTE — Patient Instructions (Signed)
1. Please have your labs and ultrasound done. We will contact to within 5-7 business days with results. 2. Start Amitiza 24 g twice daily with food for constipation. Samples provided. Prescription sent to your pharmacy. Let me know if her stools do not become more regular with this regimen.

## 2016-11-20 NOTE — Progress Notes (Signed)
[name], can you see if patient completed 24 hour urinary test with [facility], ordered by PCP.

## 2016-11-21 NOTE — Progress Notes (Signed)
cc'ed to pcp °

## 2016-11-21 NOTE — Progress Notes (Signed)
I checked in [place] and there are no 24 hr urine results.

## 2016-11-23 ENCOUNTER — Ambulatory Visit (HOSPITAL_COMMUNITY)
Admission: RE | Admit: 2016-11-23 | Discharge: 2016-11-23 | Disposition: A | Discharge status: Home / Self Care | Payer: Medicare HMO | Source: Ambulatory Visit | Attending: Gastroenterology | Admitting: Gastroenterology

## 2016-11-23 DIAGNOSIS — K5903 Drug induced constipation: Secondary | ICD-10-CM

## 2016-11-23 DIAGNOSIS — R1011 Right upper quadrant pain: Secondary | ICD-10-CM | POA: Insufficient documentation

## 2016-11-23 DIAGNOSIS — R945 Abnormal results of liver function studies: Secondary | ICD-10-CM | POA: Diagnosis not present

## 2016-11-23 DIAGNOSIS — R7989 Other specified abnormal findings of blood chemistry: Secondary | ICD-10-CM

## 2016-12-04 ENCOUNTER — Other Ambulatory Visit: Payer: Self-pay | Admitting: Nurse Practitioner

## 2016-12-06 NOTE — Progress Notes (Signed)
Please let patient know that her gb wall is slightly thick which may be due to low protein vs gb disease. She may have some liver scarring as well. Would recommend HIDA with fatty meal challenge to further evaluate gb.  We need her to have her lab work done as ordered at time of ov.  Needs OV 4 weeks, to discuss results.

## 2016-12-13 ENCOUNTER — Other Ambulatory Visit: Payer: Self-pay

## 2016-12-13 DIAGNOSIS — K828 Other specified diseases of gallbladder: Secondary | ICD-10-CM

## 2016-12-13 DIAGNOSIS — R1011 Right upper quadrant pain: Secondary | ICD-10-CM

## 2016-12-14 ENCOUNTER — Encounter: Payer: Self-pay | Admitting: Internal Medicine

## 2016-12-14 NOTE — Progress Notes (Signed)
noted 

## 2016-12-14 NOTE — Progress Notes (Signed)
[name] can you request copy of labs done at PCP

## 2016-12-17 ENCOUNTER — Encounter (HOSPITAL_COMMUNITY): Payer: Self-pay | Admitting: Emergency Medicine

## 2016-12-17 ENCOUNTER — Emergency Department (HOSPITAL_COMMUNITY)
Admission: EM | Admit: 2016-12-17 | Discharge: 2016-12-18 | Disposition: A | Discharge status: Home / Self Care | Payer: Medicare HMO | Attending: Emergency Medicine | Admitting: Emergency Medicine

## 2016-12-17 ENCOUNTER — Telehealth: Payer: Self-pay

## 2016-12-17 ENCOUNTER — Emergency Department (HOSPITAL_COMMUNITY): Payer: Medicare HMO

## 2016-12-17 ENCOUNTER — Other Ambulatory Visit (INDEPENDENT_AMBULATORY_CARE_PROVIDER_SITE_OTHER): Payer: Self-pay | Admitting: Internal Medicine

## 2016-12-17 ENCOUNTER — Encounter (HOSPITAL_COMMUNITY): Payer: Medicare HMO

## 2016-12-17 DIAGNOSIS — K529 Noninfective gastroenteritis and colitis, unspecified: Secondary | ICD-10-CM | POA: Diagnosis not present

## 2016-12-17 DIAGNOSIS — I10 Essential (primary) hypertension: Secondary | ICD-10-CM | POA: Diagnosis not present

## 2016-12-17 DIAGNOSIS — R1084 Generalized abdominal pain: Secondary | ICD-10-CM | POA: Diagnosis not present

## 2016-12-17 DIAGNOSIS — R112 Nausea with vomiting, unspecified: Secondary | ICD-10-CM | POA: Diagnosis not present

## 2016-12-17 DIAGNOSIS — G8929 Other chronic pain: Secondary | ICD-10-CM

## 2016-12-17 DIAGNOSIS — M545 Low back pain: Secondary | ICD-10-CM | POA: Diagnosis not present

## 2016-12-17 DIAGNOSIS — J449 Chronic obstructive pulmonary disease, unspecified: Secondary | ICD-10-CM | POA: Diagnosis not present

## 2016-12-17 DIAGNOSIS — F17210 Nicotine dependence, cigarettes, uncomplicated: Secondary | ICD-10-CM | POA: Insufficient documentation

## 2016-12-17 DIAGNOSIS — E876 Hypokalemia: Secondary | ICD-10-CM | POA: Diagnosis not present

## 2016-12-17 DIAGNOSIS — R197 Diarrhea, unspecified: Secondary | ICD-10-CM | POA: Diagnosis not present

## 2016-12-17 DIAGNOSIS — Z91040 Latex allergy status: Secondary | ICD-10-CM | POA: Diagnosis not present

## 2016-12-17 DIAGNOSIS — K6389 Other specified diseases of intestine: Secondary | ICD-10-CM | POA: Diagnosis not present

## 2016-12-17 DIAGNOSIS — R404 Transient alteration of awareness: Secondary | ICD-10-CM | POA: Diagnosis not present

## 2016-12-17 DIAGNOSIS — R061 Stridor: Secondary | ICD-10-CM | POA: Diagnosis not present

## 2016-12-17 DIAGNOSIS — J45909 Unspecified asthma, uncomplicated: Secondary | ICD-10-CM | POA: Insufficient documentation

## 2016-12-17 DIAGNOSIS — Z79899 Other long term (current) drug therapy: Secondary | ICD-10-CM | POA: Diagnosis not present

## 2016-12-17 DIAGNOSIS — R531 Weakness: Secondary | ICD-10-CM | POA: Diagnosis not present

## 2016-12-17 DIAGNOSIS — R69 Illness, unspecified: Secondary | ICD-10-CM | POA: Diagnosis not present

## 2016-12-17 LAB — COMPREHENSIVE METABOLIC PANEL
ALT: 24 U/L (ref 14–54)
AST: 50 U/L — AB (ref 15–41)
Albumin: 2.6 g/dL — ABNORMAL LOW (ref 3.5–5.0)
Alkaline Phosphatase: 329 U/L — ABNORMAL HIGH (ref 38–126)
Anion gap: 10 (ref 5–15)
BUN: 14 mg/dL (ref 6–20)
CALCIUM: 7.9 mg/dL — AB (ref 8.9–10.3)
CHLORIDE: 104 mmol/L (ref 101–111)
CO2: 22 mmol/L (ref 22–32)
CREATININE: 0.64 mg/dL (ref 0.44–1.00)
Glucose, Bld: 110 mg/dL — ABNORMAL HIGH (ref 65–99)
Potassium: 3.1 mmol/L — ABNORMAL LOW (ref 3.5–5.1)
Sodium: 136 mmol/L (ref 135–145)
Total Bilirubin: 1.1 mg/dL (ref 0.3–1.2)
Total Protein: 6.1 g/dL — ABNORMAL LOW (ref 6.5–8.1)

## 2016-12-17 LAB — CBC
HCT: 47.7 % — ABNORMAL HIGH (ref 36.0–46.0)
Hemoglobin: 16.1 g/dL — ABNORMAL HIGH (ref 12.0–15.0)
MCH: 29.9 pg (ref 26.0–34.0)
MCHC: 33.8 g/dL (ref 30.0–36.0)
MCV: 88.5 fL (ref 78.0–100.0)
Platelets: 328 10*3/uL (ref 150–400)
RBC: 5.39 MIL/uL — AB (ref 3.87–5.11)
RDW: 13.6 % (ref 11.5–15.5)
WBC: 15 10*3/uL — AB (ref 4.0–10.5)

## 2016-12-17 LAB — URINALYSIS, ROUTINE W REFLEX MICROSCOPIC
GLUCOSE, UA: NEGATIVE mg/dL
Hgb urine dipstick: NEGATIVE
Ketones, ur: NEGATIVE mg/dL
LEUKOCYTES UA: NEGATIVE
Nitrite: NEGATIVE
Protein, ur: >=300 mg/dL — AB
SPECIFIC GRAVITY, URINE: 1.03 (ref 1.005–1.030)
pH: 6 (ref 5.0–8.0)

## 2016-12-17 LAB — LIPASE, BLOOD: LIPASE: 34 U/L (ref 11–51)

## 2016-12-17 MED ORDER — HYDROMORPHONE HCL 1 MG/ML IJ SOLN
1.0000 mg | INTRAMUSCULAR | Status: AC | PRN
  Administered 2016-12-17 – 2016-12-18 (×2): 1 mg via INTRAVENOUS
  Filled 2016-12-17 (×2): qty 1

## 2016-12-17 MED ORDER — POTASSIUM CHLORIDE 10 MEQ/100ML IV SOLN
10.0000 meq | INTRAVENOUS | Status: AC
  Administered 2016-12-17 – 2016-12-18 (×3): 10 meq via INTRAVENOUS
  Filled 2016-12-17 (×3): qty 100

## 2016-12-17 MED ORDER — IOPAMIDOL (ISOVUE-300) INJECTION 61%
100.0000 mL | Freq: Once | INTRAVENOUS | Status: AC | PRN
  Administered 2016-12-17: 100 mL via INTRAVENOUS

## 2016-12-17 MED ORDER — ONDANSETRON HCL 4 MG/2ML IJ SOLN
4.0000 mg | Freq: Once | INTRAMUSCULAR | Status: AC
  Administered 2016-12-17: 4 mg via INTRAVENOUS
  Filled 2016-12-17: qty 2

## 2016-12-17 NOTE — ED Triage Notes (Signed)
Pt states she has been having nausea vomiting and black diarrhea for 4 days.  EMS gave 4mg  zofran in route

## 2016-12-17 NOTE — Telephone Encounter (Signed)
Pt was a no show for her HIDA scan this morning. Tried to call pt, no answer.

## 2016-12-17 NOTE — ED Notes (Signed)
Pt informed of need for urine sample but states she is not able to provide one at this time. Was informed to let nursing staff know when able to provide one.

## 2016-12-17 NOTE — ED Provider Notes (Signed)
[facility] DEPT Provider Note   CSN: [id number] Arrival date & time: 12/17/16  1959     History   Chief Complaint Chief Complaint  Patient presents with  . Emesis  . Diarrhea    HPI [name] is a 56 y.o. female.  She presents for evaluation of nausea vomiting and diarrhea, present for 5 days, with intermittent black stools.  She denies fever, chills, weakness or dizziness.  She is concerned because she has not been able to keep her medications down.  No prior similar problems.  No prior abdominal surgeries.  No recent cough, chest pain, headache or pain.  Back has been hurting because she is unable to keep her chronic pain medicine, tramadol, down.  There are no other known modifying factors.  HPI  Past Medical History:  Diagnosis Date  . Anxiety   . Asthma   . COPD (chronic obstructive pulmonary disease) ([place])   . Depression   . Disc degeneration, lumbar   . GERD (gastroesophageal reflux disease)   . Hypertension   . Leukocytosis   . Lumbar disc disease   . Shortness of breath   . Thrombocytosis [facility])     Patient Active Problem List   Diagnosis Date Noted  . RUQ pain 11/20/2016  . Abnormal LFTs 11/20/2016  . Dysphagia, pharyngoesophageal phase 03/10/2014  . Constipation 03/09/2014  . FH: colon cancer in first degree relative <60 years old 03/09/2014  . 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  . Folic acid deficiency 76/28/3151  . Leukocytosis 01/01/2012  . Thrombocytosis ([place])   . TRANSAMINASES, SERUM, ELEVATED 10/24/2009  . HEMATEMESIS 06/28/2009  . HEMATOCHEZIA 03/07/2009  . Nausea with vomiting 03/07/2009  . Diarrhea 03/07/2009  . ABDOMINAL PAIN 03/07/2009  . History of colonic polyps 03/07/2009  . FATTY LIVER DISEASE, HX OF 03/07/2009  . CARPAL TUNNEL SYNDROME 03/03/2008  . MONONEURITIS, LEG 03/03/2008    Past Surgical History:  Procedure Laterality Date  . ABDOMINAL HYSTERECTOMY    . BIOPSY  03/25/2014   Procedure: GASTRIC BIOPSIES;  Surgeon: Daneil Dolin, MD;  Location: [facility] ORS;  Service: Endoscopy;;  . BRAIN SURGERY    . COLONOSCOPY  02/21/2007   VOH:YWVP papilla and internal hemorrhoids, diminutive rectal polyp/Left-sided diverticula (sigmoid) pedunculated polyps mid descending  . COLONOSCOPY  2008   Dr. Gala Romney: segmental biopsy negative for microscopic colitis. Three polyps, one tubular adenoma. Surveillance due 2013.   Marland Kitchen COLONOSCOPY WITH PROPOFOL N/A 03/25/2014   RMR: Rectal and colonic polyps removed as described above.  Colonic diverticulosis. CT abnormality likely artifactual in orgin    . ESOPHAGOGASTRODUODENOSCOPY  08/08/2005   XTG:GYIRSW esophagogastroduodenoscopy.  . ESOPHAGOGASTRODUODENOSCOPY  2011   Dr. Gala Romney: normal esophagus, small hiatal hernia, duodenal diverticulum, negative H.pylori and celiac  . 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 Abnormal gastic mucosa of uncertain significance-status post gasrtric biopsy  . MALONEY DILATION N/A 03/25/2014   Procedure: MALONEY ESOPHAGEAL DILATION #54 Fr;  Surgeon: Daneil Dolin, MD;  Location: [facility];  Service: Endoscopy;  Laterality: N/A;  . POLYPECTOMY  03/25/2014   Procedure: SIGMOID AND RECTAL POLYPECTOMY;  Surgeon: Daneil Dolin, MD;  Location: [facility];  Service: Endoscopy;;  . right knee surgery    . rt breast biopsy    . TOTAL ABDOMINAL HYSTERECTOMY W/ BILATERAL SALPINGOOPHORECTOMY     age 30    OB History    No data available  Home Medications    Prior to Admission medications   Medication Sig Start Date End Date Taking? Authorizing Provider  ALPRAZolam Duanne Moron) 1 MG tablet Take 1 mg by mouth 3 (three) times daily as needed for anxiety.   Yes [provider]  baclofen (LIORESAL) 10 MG tablet Take 10 mg by mouth at bedtime. May take up to 3 times daily for Muscle spasms   Yes [provider]  DULoxetine (CYMBALTA) 60 MG capsule Take 60 mg by mouth at bedtime.    Yes [provider]  estradiol (ESTRACE) 1 MG tablet Take 1 mg by mouth daily.   Yes [provider]  fentaNYL (DURAGESIC - DOSED MCG/HR) 25 MCG/HR patch Place 1 patch onto the skin every 3 (three) days. 10/30/16  Yes [provider]  furosemide (LASIX) 20 MG tablet Take 20 mg by mouth daily. 12/10/16  Yes [provider]  levothyroxine (SYNTHROID, LEVOTHROID) 25 MCG tablet Take 25 mcg by mouth daily. 10/30/16  Yes [provider]  lubiprostone (AMITIZA) 24 MCG capsule Take 1 capsule (24 mcg total) by mouth 2 (two) times daily with a meal. 11/20/16  Yes Mahala Menghini, PA-C  metoCLOPramide (REGLAN) 5 MG tablet Take 5 mg by mouth 4 (four) times daily -  before meals and at bedtime.    Yes [provider]  metoprolol tartrate (LOPRESSOR) 25 MG tablet Take 25 mg by mouth daily.    Yes [provider]  mometasone-formoterol (DULERA) 100-5 MCG/ACT AERO Inhale 2 puffs into the lungs 2 (two) times daily.   Yes [provider]  omeprazole (PRILOSEC) 20 MG capsule Take 20 mg by mouth at bedtime.    Yes [provider]  oxycodone (ROXICODONE) 30 MG immediate release tablet Take 60 mg by mouth every 4 (four) hours.    Yes [provider]  PROAIR HFA 108 (90 BASE) MCG/ACT inhaler Inhale 2 puffs into the lungs daily as needed for wheezing or shortness of breath.  09/07/13  Yes [provider]  promethazine (PHENERGAN) 25 MG tablet Take 1 tablet (25 mg total) by mouth every 8 (eight) hours as needed for nausea or vomiting. Patient taking differently: Take 25 mg by mouth every 4 (four) hours.  12/09/13  Yes Rolland Porter, MD  tiZANidine (ZANAFLEX) 4 MG tablet Take 4 mg by mouth at bedtime.  12/06/15  Yes [provider]  venlafaxine (EFFEXOR) 75 MG tablet Take 75 mg by mouth daily.   Yes [provider]  zolpidem (AMBIEN) 10  MG tablet Take 10 mg by mouth at bedtime.     Yes [provider]    Family History Family History  Problem Relation Age of Onset  . Adopted: Yes  . Pancreatic cancer Mother   . Hepatitis C Brother   . Liver cancer Brother        suicide  . Pancreatic cancer Maternal Aunt   . Kidney cancer Maternal Uncle   . Colon cancer Paternal Aunt   . Kidney cancer Paternal Grandfather   . Colon cancer Brother 78  . Leukemia Sister        1/2 sister  . Pancreatic cancer Paternal Grandmother   . Pancreatic cancer Maternal Uncle     Social History Social History  Substance Use Topics  . Smoking status: Current Every Day Smoker    Packs/day: 1.00    Years: 30.00    Types: Cigarettes  . Smokeless tobacco: Never Used  . Alcohol use No  Comment: see HPI. Patient denies alcohol use at present     Allergies   Codeine; Latex; Lyrica [pregabalin]; Nsaids; and Other   Review of Systems Review of Systems  All other systems reviewed and are negative.    Physical Exam Updated Vital Signs BP (!) 192/103   Pulse 99   Temp 98.1 F (36.7 C) (Oral)   Resp 18   Ht 5\' 6"  (1.676 m)   Wt 90.7 kg (200 lb)   SpO2 98%   BMI 32.28 kg/m   Physical Exam  Constitutional: She is oriented to person, place, and time. She appears well-developed.  Obese  HENT:  Head: Normocephalic and atraumatic.  Eyes: Pupils are equal, round, and reactive to light. Conjunctivae and EOM are normal.  Neck: Normal range of motion and phonation normal. Neck supple.  Cardiovascular: Normal rate and regular rhythm.   Pulmonary/Chest: Effort normal and breath sounds normal. She exhibits no tenderness.  Abdominal: Soft. She exhibits no distension and no mass. There is tenderness (Bilateral upper abdomen, moderate). There is no rebound and no guarding.  Musculoskeletal: Normal range of motion.  Neurological: She is alert and oriented to person, place, and time. She exhibits normal muscle tone.  Skin: Skin  is warm and dry.  Psychiatric: She has a normal mood and affect. Her behavior is normal. Judgment and thought content normal.  Nursing note and vitals reviewed.    ED Treatments / Results  Labs (all labs ordered are listed, but only abnormal results are displayed) Labs Reviewed  COMPREHENSIVE METABOLIC PANEL - Abnormal; Notable for the following:       Result Value   Potassium 3.1 (*)    Glucose, Bld 110 (*)    Calcium 7.9 (*)    Total Protein 6.1 (*)    Albumin 2.6 (*)    AST 50 (*)    Alkaline Phosphatase 329 (*)    All other components within normal limits  CBC - Abnormal; Notable for the following:    WBC 15.0 (*)    RBC 5.39 (*)    Hemoglobin 16.1 (*)    HCT 47.7 (*)    All other components within normal limits  URINALYSIS, ROUTINE W REFLEX MICROSCOPIC - Abnormal; Notable for the following:    Color, Urine AMBER (*)    APPearance HAZY (*)    Bilirubin Urine SMALL (*)    Protein, ur >=300 (*)    Bacteria, UA RARE (*)    Squamous Epithelial / LPF 6-30 (*)    All other components within normal limits  LIPASE, BLOOD    EKG  EKG Interpretation None       Radiology Ct Abdomen Pelvis W Contrast  Result Date: 12/17/2016 CLINICAL DATA:  Initial evaluation for chronic pain, increased over past 4 days, black diarrhea. EXAM: CT ABDOMEN AND PELVIS WITH CONTRAST TECHNIQUE: Multidetector CT imaging of the abdomen and pelvis was performed using the standard protocol following bolus administration of intravenous contrast. CONTRAST:  119mL ISOVUE-300 IOPAMIDOL (ISOVUE-300) INJECTION 61% COMPARISON:  Prior CT from 12/09/2013. FINDINGS: Lower chest: Scattered atelectatic changes present within the visualized lung bases. Visualized lung bases are otherwise clear. No pleural or pericardial effusion. Hepatobiliary: Liver is heterogeneous with diffuse nodular contour, consistent with cirrhosis, advanced relative to previous exam. No definite focal intrahepatic lesions, although evaluation  limited on this exam. Gallbladder within normal limits. Small amount of free pericholecystic fluid like related to ascites/liver disease. No biliary dilatation. Pancreas: Pancreas within normal limits without acute inflammatory changes  or mass lesion. Spleen: Spleen within normal limits.  No splenomegaly. Adrenals/Urinary Tract: The adrenal glands are normal. Kidneys equal in size with symmetric enhancement. No nephrolithiasis, hydronephrosis, or focal enhancing renal mass. No hydroureter. Bladder largely decompressed without acute abnormality. Stomach/Bowel: Stomach within normal limits. No evidence for bowel obstruction. Colon largely decompressed. Mild circumferential wall thickening about the transverse and ascending colon favored to be related incomplete distension and/ or midline hepatic enteropathy. Superimposed acute colitis could be considered in the correct clinical setting. Appendix is absent. No other significant inflammatory changes seen about the bowels. Vascular/Lymphatic: Normal intravascular enhancement seen throughout the intra-abdominal aorta and its branch vessels. Mild aorto bi-iliac atherosclerotic disease. No aneurysm. Scattered gastrohepatic and paraesophageal varices with recannulized paraumbilical vein, consistent with portal hypertension. Portal vein and SMV are patent. No pathologically enlarged intra-abdominal or pelvic lymph nodes. Reproductive: Uterus is absent.  Ovaries not discretely identified. Other: Moderate volume ascites.  No free intraperitoneal air. Musculoskeletal: No acute osseus abnormality. No worrisome lytic or blastic osseous lesions. Bilateral facet arthropathy noted within the lumbar spine. IMPRESSION: 1. Hepatic cirrhosis with evidence for portal hypertension. Associated moderate volume ascites within the abdomen. 2. Circumferential wall thickening about the ascending and transverse colon, favored to be related to incomplete distension and/or hepatic enteropathy.  Superimposed acute colitis could be considered in the correct clinical setting. 3. Aortic atherosclerosis. Electronically Signed   By: Jeannine Boga M.D.   On: 12/17/2016 23:53    Procedures Procedures (including critical care time)  Medications Ordered in ED Medications  potassium chloride 10 mEq in 100 mL IVPB (10 mEq Intravenous New Bag/Given 12/17/16 2256)  ondansetron (ZOFRAN) injection 4 mg (not administered)  ondansetron (ZOFRAN) injection 4 mg (4 mg Intravenous Given 12/17/16 2246)  HYDROmorphone (DILAUDID) injection 1 mg (1 mg Intravenous Given 12/18/16 0000)  iopamidol (ISOVUE-300) 61 % injection 100 mL (100 mLs Intravenous Contrast Given 12/17/16 2306)     Initial Impression / Assessment and Plan / ED Course  I have reviewed the triage vital signs and the nursing notes.  Pertinent labs & imaging results that were available during my care of the patient were reviewed by me and considered in my medical decision making (see chart for details).      Patient Vitals for the past 24 hrs:  BP Temp Temp src Pulse Resp SpO2 Height Weight  12/18/16 0002 (!) 192/103 - - 99 18 98 % - -  12/17/16 2230 (!) 158/87 - - (!) 107 - 96 % - -  12/17/16 2200 (!) 158/87 - - (!) 106 - 96 % - -  12/17/16 2130 136/89 - - (!) 113 - 97 % - -  12/17/16 2045 - - - (!) 107 - 99 % - -  12/17/16 2030 (!) 169/99 - - (!) 112 - 95 % - -  12/17/16 2005 - - - (!) 108 - 99 % - -  12/17/16 2004 - - - (!) 105 - 98 % - -  12/17/16 2003 - - - (!) 105 - 99 % - -  12/17/16 2002 - - - (!) 110 - 99 % - -  12/17/16 2001 (!) 157/92 98.1 F (36.7 C) Oral (!) 108 - 100 % 5\' 6"  (1.676 m) 90.7 kg (200 lb)    12:10 AM Reevaluation with update and discussion. After initial assessment and treatment, an updated evaluation reveals she feels somewhat better at this time but is nauseated.  He would like to try some more antiemetic, then an oral  fluid trial.  Findings discussed and she agrees to home if she can tolerate  oral fluids.Daleen Bo L    Final Clinical Impressions(s) / ED Diagnoses   Final diagnoses:  Generalized abdominal pain  Hypokalemia  Chronic low back pain without sciatica, unspecified back pain laterality    Non-specific nausea vomiting and diarrhea and possible mild acute colitis, unspecified.  No hemodynamic instability.  Incidental hypokalemia.  Doubt serious bacterial infection, bowel obstruction, or metabolic instability.  Suspect hypokalemia will improved as oral diet, improves.  Nursing Notes Reviewed/ Care Coordinated Applicable Imaging Reviewed Interpretation of Laboratory Data incorporated into ED treatment  The patient appears reasonably screened and/or stabilized for discharge and I doubt any other medical condition or other [facility] requiring further screening, evaluation, or treatment in the ED at this time prior to discharge.  Plan: Home Medications-resume usual medications as soon as possible; Home Treatments-gradually advance diet; return here if the recommended treatment, does not improve the symptoms; Recommended follow up-PCP checkup in 3-5 days and as needed  New Prescriptions New Prescriptions   No medications on file     Daleen Bo, MD 12/18/16 1700

## 2016-12-17 NOTE — Progress Notes (Signed)
PATIENT SCHEDULED  °

## 2016-12-18 DIAGNOSIS — J449 Chronic obstructive pulmonary disease, unspecified: Secondary | ICD-10-CM | POA: Diagnosis not present

## 2016-12-18 DIAGNOSIS — I10 Essential (primary) hypertension: Secondary | ICD-10-CM | POA: Diagnosis not present

## 2016-12-18 DIAGNOSIS — J45909 Unspecified asthma, uncomplicated: Secondary | ICD-10-CM | POA: Diagnosis not present

## 2016-12-18 DIAGNOSIS — M545 Low back pain: Secondary | ICD-10-CM | POA: Diagnosis not present

## 2016-12-18 DIAGNOSIS — Z79899 Other long term (current) drug therapy: Secondary | ICD-10-CM | POA: Diagnosis not present

## 2016-12-18 DIAGNOSIS — R1084 Generalized abdominal pain: Secondary | ICD-10-CM | POA: Diagnosis not present

## 2016-12-18 DIAGNOSIS — R69 Illness, unspecified: Secondary | ICD-10-CM | POA: Diagnosis not present

## 2016-12-18 DIAGNOSIS — E876 Hypokalemia: Secondary | ICD-10-CM | POA: Diagnosis not present

## 2016-12-18 DIAGNOSIS — Z91040 Latex allergy status: Secondary | ICD-10-CM | POA: Diagnosis not present

## 2016-12-18 MED ORDER — ONDANSETRON HCL 4 MG/2ML IJ SOLN
4.0000 mg | Freq: Once | INTRAMUSCULAR | Status: AC
  Administered 2016-12-18: 4 mg via INTRAVENOUS
  Filled 2016-12-18: qty 2

## 2016-12-18 MED ORDER — CIPROFLOXACIN HCL 500 MG PO TABS: 500.0000 mg | ORAL_TABLET | Freq: Two times a day (BID) | ORAL | 0 refills | Status: DC

## 2016-12-18 MED ORDER — METRONIDAZOLE 500 MG PO TABS: 500.0000 mg | ORAL_TABLET | Freq: Two times a day (BID) | ORAL | 0 refills | Status: DC

## 2016-12-18 MED ORDER — ONDANSETRON HCL 8 MG PO TABS: 8.0000 mg | ORAL_TABLET | Freq: Three times a day (TID) | ORAL | 0 refills | Status: DC | PRN

## 2016-12-18 MED ORDER — ONDANSETRON HCL 4 MG/2ML IJ SOLN
4.0000 mg | Freq: Once | INTRAMUSCULAR | Status: AC
  Administered 2016-12-18: 4 mg via INTRAVENOUS

## 2016-12-18 MED ORDER — ONDANSETRON HCL 4 MG/2ML IJ SOLN
INTRAMUSCULAR | Status: AC
  Filled 2016-12-18: qty 2

## 2016-12-18 NOTE — ED Provider Notes (Signed)
Patient signed out to me by Dr. Eulis Foster. Patient seen with nausea, vomiting, diarrhea, diagnosed with mild colitis. Patient being administered fluids. She is tolerating oral intake. She feels better, blood pressure improved, heart rate improved. She will be discharged, treated with Cipro and Flagyl as an outpatient. Follow-up with PCP.   Orpah Greek, MD 12/18/16 [phone]

## 2016-12-18 NOTE — Discharge Instructions (Signed)
Start with a clear liquid diet and gradually advance to regular foods over 1 or 2 days.  Soon your usual medications, as soon as possible.  Follow up with your primary care doctor for checkup within 1 week.  Return here if needed for problems.

## 2016-12-20 DIAGNOSIS — R188 Other ascites: Secondary | ICD-10-CM | POA: Diagnosis not present

## 2016-12-20 DIAGNOSIS — I10 Essential (primary) hypertension: Secondary | ICD-10-CM | POA: Diagnosis not present

## 2016-12-20 DIAGNOSIS — K529 Noninfective gastroenteritis and colitis, unspecified: Secondary | ICD-10-CM | POA: Diagnosis not present

## 2016-12-20 DIAGNOSIS — G894 Chronic pain syndrome: Secondary | ICD-10-CM | POA: Diagnosis not present

## 2016-12-20 DIAGNOSIS — K7460 Unspecified cirrhosis of liver: Secondary | ICD-10-CM | POA: Diagnosis not present

## 2016-12-20 DIAGNOSIS — R69 Illness, unspecified: Secondary | ICD-10-CM | POA: Diagnosis not present

## 2016-12-20 DIAGNOSIS — Z6835 Body mass index (BMI) 35.0-35.9, adult: Secondary | ICD-10-CM | POA: Diagnosis not present

## 2016-12-26 NOTE — Telephone Encounter (Signed)
Letter mailed to pt.  

## 2017-01-16 ENCOUNTER — Ambulatory Visit: Payer: Medicare HMO | Admitting: Gastroenterology

## 2017-01-16 ENCOUNTER — Encounter: Payer: Self-pay | Admitting: Gastroenterology

## 2017-01-16 ENCOUNTER — Telehealth: Payer: Self-pay | Admitting: Gastroenterology

## 2017-01-16 NOTE — Telephone Encounter (Signed)
Patient was a no show and letter sent  °

## 2017-02-02 ENCOUNTER — Other Ambulatory Visit: Payer: Self-pay | Admitting: Nurse Practitioner

## 2017-03-13 DIAGNOSIS — R609 Edema, unspecified: Secondary | ICD-10-CM | POA: Diagnosis not present

## 2017-03-13 DIAGNOSIS — Z6833 Body mass index (BMI) 33.0-33.9, adult: Secondary | ICD-10-CM | POA: Diagnosis not present

## 2017-03-13 DIAGNOSIS — G894 Chronic pain syndrome: Secondary | ICD-10-CM | POA: Diagnosis not present

## 2017-03-13 DIAGNOSIS — R69 Illness, unspecified: Secondary | ICD-10-CM | POA: Diagnosis not present

## 2017-03-13 DIAGNOSIS — E063 Autoimmune thyroiditis: Secondary | ICD-10-CM | POA: Diagnosis not present

## 2017-03-13 DIAGNOSIS — R0600 Dyspnea, unspecified: Secondary | ICD-10-CM | POA: Diagnosis not present

## 2017-04-10 ENCOUNTER — Encounter: Payer: Self-pay | Admitting: Cardiovascular Disease

## 2017-05-03 ENCOUNTER — Emergency Department (HOSPITAL_COMMUNITY): Payer: Medicare HMO

## 2017-05-03 ENCOUNTER — Inpatient Hospital Stay (HOSPITAL_COMMUNITY)
Admission: EM | Admit: 2017-05-03 | Discharge: 2017-05-05 | DRG: 392 | Disposition: A | Discharge status: Home / Self Care | Payer: Medicare HMO | Attending: Internal Medicine | Admitting: Internal Medicine

## 2017-05-03 ENCOUNTER — Encounter (HOSPITAL_COMMUNITY): Payer: Self-pay | Admitting: Emergency Medicine

## 2017-05-03 ENCOUNTER — Other Ambulatory Visit: Payer: Self-pay

## 2017-05-03 DIAGNOSIS — Z8051 Family history of malignant neoplasm of kidney: Secondary | ICD-10-CM | POA: Diagnosis not present

## 2017-05-03 DIAGNOSIS — K5750 Diverticulosis of both small and large intestine without perforation or abscess without bleeding: Secondary | ICD-10-CM | POA: Diagnosis present

## 2017-05-03 DIAGNOSIS — Z886 Allergy status to analgesic agent status: Secondary | ICD-10-CM | POA: Diagnosis not present

## 2017-05-03 DIAGNOSIS — R1084 Generalized abdominal pain: Secondary | ICD-10-CM

## 2017-05-03 DIAGNOSIS — J449 Chronic obstructive pulmonary disease, unspecified: Secondary | ICD-10-CM | POA: Diagnosis not present

## 2017-05-03 DIAGNOSIS — R079 Chest pain, unspecified: Secondary | ICD-10-CM | POA: Diagnosis not present

## 2017-05-03 DIAGNOSIS — D72829 Elevated white blood cell count, unspecified: Secondary | ICD-10-CM | POA: Diagnosis present

## 2017-05-03 DIAGNOSIS — E039 Hypothyroidism, unspecified: Secondary | ICD-10-CM | POA: Diagnosis present

## 2017-05-03 DIAGNOSIS — I161 Hypertensive emergency: Secondary | ICD-10-CM

## 2017-05-03 DIAGNOSIS — Z79891 Long term (current) use of opiate analgesic: Secondary | ICD-10-CM | POA: Diagnosis not present

## 2017-05-03 DIAGNOSIS — K760 Fatty (change of) liver, not elsewhere classified: Secondary | ICD-10-CM | POA: Diagnosis present

## 2017-05-03 DIAGNOSIS — F17210 Nicotine dependence, cigarettes, uncomplicated: Secondary | ICD-10-CM | POA: Diagnosis present

## 2017-05-03 DIAGNOSIS — Z90710 Acquired absence of both cervix and uterus: Secondary | ICD-10-CM | POA: Diagnosis not present

## 2017-05-03 DIAGNOSIS — A084 Viral intestinal infection, unspecified: Principal | ICD-10-CM | POA: Diagnosis present

## 2017-05-03 DIAGNOSIS — E669 Obesity, unspecified: Secondary | ICD-10-CM | POA: Diagnosis present

## 2017-05-03 DIAGNOSIS — Z91040 Latex allergy status: Secondary | ICD-10-CM | POA: Diagnosis not present

## 2017-05-03 DIAGNOSIS — I10 Essential (primary) hypertension: Secondary | ICD-10-CM | POA: Diagnosis present

## 2017-05-03 DIAGNOSIS — I160 Hypertensive urgency: Secondary | ICD-10-CM | POA: Diagnosis present

## 2017-05-03 DIAGNOSIS — R112 Nausea with vomiting, unspecified: Secondary | ICD-10-CM | POA: Diagnosis present

## 2017-05-03 DIAGNOSIS — Z86010 Personal history of colonic polyps: Secondary | ICD-10-CM

## 2017-05-03 DIAGNOSIS — K7469 Other cirrhosis of liver: Secondary | ICD-10-CM | POA: Diagnosis present

## 2017-05-03 DIAGNOSIS — R7989 Other specified abnormal findings of blood chemistry: Secondary | ICD-10-CM | POA: Diagnosis present

## 2017-05-03 DIAGNOSIS — M5136 Other intervertebral disc degeneration, lumbar region: Secondary | ICD-10-CM | POA: Diagnosis not present

## 2017-05-03 DIAGNOSIS — G894 Chronic pain syndrome: Secondary | ICD-10-CM | POA: Diagnosis not present

## 2017-05-03 DIAGNOSIS — K7460 Unspecified cirrhosis of liver: Secondary | ICD-10-CM | POA: Diagnosis not present

## 2017-05-03 DIAGNOSIS — Z885 Allergy status to narcotic agent status: Secondary | ICD-10-CM

## 2017-05-03 DIAGNOSIS — Z888 Allergy status to other drugs, medicaments and biological substances status: Secondary | ICD-10-CM

## 2017-05-03 DIAGNOSIS — R748 Abnormal levels of other serum enzymes: Secondary | ICD-10-CM | POA: Diagnosis not present

## 2017-05-03 DIAGNOSIS — R197 Diarrhea, unspecified: Secondary | ICD-10-CM | POA: Diagnosis not present

## 2017-05-03 DIAGNOSIS — E782 Mixed hyperlipidemia: Secondary | ICD-10-CM | POA: Diagnosis not present

## 2017-05-03 DIAGNOSIS — Z800 Family history of malignant neoplasm of digestive organs: Secondary | ICD-10-CM | POA: Diagnosis not present

## 2017-05-03 DIAGNOSIS — K7290 Hepatic failure, unspecified without coma: Secondary | ICD-10-CM | POA: Diagnosis present

## 2017-05-03 DIAGNOSIS — R778 Other specified abnormalities of plasma proteins: Secondary | ICD-10-CM | POA: Diagnosis present

## 2017-05-03 DIAGNOSIS — R1110 Vomiting, unspecified: Secondary | ICD-10-CM | POA: Diagnosis not present

## 2017-05-03 DIAGNOSIS — Z79890 Hormone replacement therapy (postmenopausal): Secondary | ICD-10-CM | POA: Diagnosis not present

## 2017-05-03 DIAGNOSIS — R109 Unspecified abdominal pain: Secondary | ICD-10-CM | POA: Diagnosis present

## 2017-05-03 DIAGNOSIS — Z6831 Body mass index (BMI) 31.0-31.9, adult: Secondary | ICD-10-CM

## 2017-05-03 DIAGNOSIS — F419 Anxiety disorder, unspecified: Secondary | ICD-10-CM | POA: Diagnosis present

## 2017-05-03 DIAGNOSIS — R188 Other ascites: Secondary | ICD-10-CM | POA: Diagnosis not present

## 2017-05-03 DIAGNOSIS — Z806 Family history of leukemia: Secondary | ICD-10-CM | POA: Diagnosis not present

## 2017-05-03 DIAGNOSIS — K219 Gastro-esophageal reflux disease without esophagitis: Secondary | ICD-10-CM | POA: Diagnosis present

## 2017-05-03 DIAGNOSIS — R404 Transient alteration of awareness: Secondary | ICD-10-CM | POA: Diagnosis not present

## 2017-05-03 DIAGNOSIS — F329 Major depressive disorder, single episode, unspecified: Secondary | ICD-10-CM | POA: Diagnosis present

## 2017-05-03 DIAGNOSIS — G8929 Other chronic pain: Secondary | ICD-10-CM

## 2017-05-03 DIAGNOSIS — E869 Volume depletion, unspecified: Secondary | ICD-10-CM | POA: Diagnosis present

## 2017-05-03 DIAGNOSIS — R531 Weakness: Secondary | ICD-10-CM | POA: Diagnosis not present

## 2017-05-03 DIAGNOSIS — I700 Atherosclerosis of aorta: Secondary | ICD-10-CM | POA: Diagnosis present

## 2017-05-03 LAB — GRAM STAIN

## 2017-05-03 LAB — COMPREHENSIVE METABOLIC PANEL
ALK PHOS: 604 U/L — AB (ref 38–126)
ALT: 11 U/L — AB (ref 14–54)
AST: 32 U/L (ref 15–41)
Albumin: 2.5 g/dL — ABNORMAL LOW (ref 3.5–5.0)
Anion gap: 8 (ref 5–15)
BUN: 9 mg/dL (ref 6–20)
CO2: 25 mmol/L (ref 22–32)
Calcium: 8.2 mg/dL — ABNORMAL LOW (ref 8.9–10.3)
Chloride: 109 mmol/L (ref 101–111)
Creatinine, Ser: 0.55 mg/dL (ref 0.44–1.00)
GFR calc non Af Amer: >60 mL/min (ref >60)
Glucose, Bld: 115 mg/dL — ABNORMAL HIGH (ref 65–99)
Potassium: 3.6 mmol/L (ref 3.5–5.1)
SODIUM: 142 mmol/L (ref 135–145)
TOTAL PROTEIN: 6.2 g/dL — AB (ref 6.5–8.1)
Total Bilirubin: 1 mg/dL (ref 0.3–1.2)

## 2017-05-03 LAB — PHOSPHORUS: Phosphorus: 4.2 mg/dL (ref 2.5–4.6)

## 2017-05-03 LAB — GLUCOSE, PLEURAL OR PERITONEAL FLUID: GLUCOSE FL: 132 mg/dL

## 2017-05-03 LAB — BODY FLUID CELL COUNT WITH DIFFERENTIAL
Eos, Fluid: 0 %
Lymphs, Fluid: 58 %
Monocyte-Macrophage-Serous Fluid: 35 % — ABNORMAL LOW (ref 50–90)
Neutrophil Count, Fluid: 7 % (ref 0–25)
Total Nucleated Cell Count, Fluid: 309 uL (ref 0–1000)

## 2017-05-03 LAB — PROTIME-INR
INR: 1.19
Prothrombin Time: 15 s (ref 11.4–15.2)

## 2017-05-03 LAB — TROPONIN I
TROPONIN I: 0.07 ng/mL — AB (ref <0.03)
Troponin I: 0.13 ng/mL (ref <0.03)
Troponin I: 0.19 ng/mL (ref <0.03)

## 2017-05-03 LAB — ALBUMIN, PLEURAL OR PERITONEAL FLUID: Albumin, Fluid: <1 g/dL

## 2017-05-03 LAB — CBC WITH DIFFERENTIAL/PLATELET
Basophils Absolute: 0 10*3/uL (ref 0.0–0.1)
Basophils Relative: 0 %
EOS ABS: 0 10*3/uL (ref 0.0–0.7)
Eosinophils Relative: 0 %
HEMATOCRIT: 40 % (ref 36.0–46.0)
HEMOGLOBIN: 12.9 g/dL (ref 12.0–15.0)
LYMPHS ABS: 1.8 10*3/uL (ref 0.7–4.0)
LYMPHS PCT: 11 %
MCH: 29.3 pg (ref 26.0–34.0)
MCHC: 32.3 g/dL (ref 30.0–36.0)
MCV: 90.7 fL (ref 78.0–100.0)
Monocytes Absolute: 0.8 10*3/uL (ref 0.1–1.0)
Monocytes Relative: 5 %
NEUTROS PCT: 84 %
Neutro Abs: 13.8 10*3/uL — ABNORMAL HIGH (ref 1.7–7.7)
Platelets: 390 10*3/uL (ref 150–400)
RBC: 4.41 MIL/uL (ref 3.87–5.11)
RDW: 15.2 % (ref 11.5–15.5)
WBC: 16.4 10*3/uL — AB (ref 4.0–10.5)

## 2017-05-03 LAB — LACTATE DEHYDROGENASE, PLEURAL OR PERITONEAL FLUID: LD FL: 41 U/L — AB (ref 3–23)

## 2017-05-03 LAB — PROTEIN, PLEURAL OR PERITONEAL FLUID: Total protein, fluid: <3 g/dL

## 2017-05-03 LAB — MAGNESIUM: Magnesium: 1.6 mg/dL — ABNORMAL LOW (ref 1.7–2.4)

## 2017-05-03 LAB — LIPASE, BLOOD: Lipase: 17 U/L (ref 11–51)

## 2017-05-03 MED ORDER — ENOXAPARIN SODIUM 40 MG/0.4ML ~~LOC~~ SOLN
40.0000 mg | SUBCUTANEOUS | Status: DC
Start: 2017-05-04 — End: 2017-05-05
  Administered 2017-05-04 – 2017-05-05 (×2): 40 mg via SUBCUTANEOUS
  Filled 2017-05-03 (×2): qty 0.4

## 2017-05-03 MED ORDER — ONDANSETRON HCL 4 MG/2ML IJ SOLN
4.0000 mg | Freq: Once | INTRAMUSCULAR | Status: AC
  Administered 2017-05-03: 4 mg via INTRAVENOUS
  Filled 2017-05-03: qty 2

## 2017-05-03 MED ORDER — ONDANSETRON HCL 4 MG/2ML IJ SOLN
4.0000 mg | Freq: Four times a day (QID) | INTRAMUSCULAR | Status: DC | PRN
  Administered 2017-05-04 – 2017-05-05 (×3): 4 mg via INTRAVENOUS
  Filled 2017-05-03 (×3): qty 2

## 2017-05-03 MED ORDER — ASPIRIN 300 MG RE SUPP
300.0000 mg | Freq: Once | RECTAL | Status: DC
  Filled 2017-05-03: qty 1

## 2017-05-03 MED ORDER — FENTANYL CITRATE (PF) 100 MCG/2ML IJ SOLN
100.0000 ug | Freq: Once | INTRAMUSCULAR | Status: AC
  Administered 2017-05-03: 100 ug via INTRAVENOUS
  Filled 2017-05-03: qty 2

## 2017-05-03 MED ORDER — LABETALOL HCL 5 MG/ML IV SOLN
10.0000 mg | INTRAVENOUS | Status: DC | PRN
  Administered 2017-05-04 (×2): 10 mg via INTRAVENOUS
  Filled 2017-05-03 (×2): qty 4

## 2017-05-03 MED ORDER — HYDRALAZINE HCL 20 MG/ML IJ SOLN
10.0000 mg | INTRAMUSCULAR | Status: DC | PRN
  Administered 2017-05-04: 10 mg via INTRAVENOUS
  Filled 2017-05-03: qty 1

## 2017-05-03 MED ORDER — SODIUM CHLORIDE 0.9 % IV BOLUS (SEPSIS)
1000.0000 mL | Freq: Once | INTRAVENOUS | Status: AC
  Administered 2017-05-03: 1000 mL via INTRAVENOUS

## 2017-05-03 MED ORDER — MOMETASONE FURO-FORMOTEROL FUM 100-5 MCG/ACT IN AERO
INHALATION_SPRAY | RESPIRATORY_TRACT | Status: AC
  Filled 2017-05-03: qty 8.8

## 2017-05-03 MED ORDER — METOCLOPRAMIDE HCL 5 MG/ML IJ SOLN
10.0000 mg | Freq: Once | INTRAMUSCULAR | Status: AC
  Administered 2017-05-03: 10 mg via INTRAVENOUS
  Filled 2017-05-03: qty 2

## 2017-05-03 MED ORDER — ESTRADIOL 1 MG PO TABS
1.0000 mg | ORAL_TABLET | Freq: Every day | ORAL | Status: DC
  Administered 2017-05-04 – 2017-05-05 (×2): 1 mg via ORAL
  Filled 2017-05-03 (×4): qty 1

## 2017-05-03 MED ORDER — MOMETASONE FURO-FORMOTEROL FUM 100-5 MCG/ACT IN AERO
2.0000 | INHALATION_SPRAY | Freq: Two times a day (BID) | RESPIRATORY_TRACT | Status: DC
  Administered 2017-05-04 – 2017-05-05 (×3): 2 via RESPIRATORY_TRACT
  Filled 2017-05-03: qty 8.8

## 2017-05-03 MED ORDER — ALPRAZOLAM 1 MG PO TABS
1.0000 mg | ORAL_TABLET | Freq: Three times a day (TID) | ORAL | Status: DC | PRN
  Administered 2017-05-04 – 2017-05-05 (×2): 1 mg via ORAL
  Filled 2017-05-03: qty 2
  Filled 2017-05-03: qty 1

## 2017-05-03 MED ORDER — SODIUM CHLORIDE 0.9 % IV BOLUS (SEPSIS): 1000.0000 mL | Freq: Once | INTRAVENOUS | Status: DC

## 2017-05-03 MED ORDER — ALBUTEROL SULFATE (2.5 MG/3ML) 0.083% IN NEBU: 3.0000 mL | INHALATION_SOLUTION | Freq: Every day | RESPIRATORY_TRACT | Status: DC | PRN

## 2017-05-03 MED ORDER — LEVOTHYROXINE SODIUM 25 MCG PO TABS
25.0000 ug | ORAL_TABLET | Freq: Every day | ORAL | Status: DC
  Administered 2017-05-04 – 2017-05-05 (×2): 25 ug via ORAL
  Filled 2017-05-03 (×2): qty 1

## 2017-05-03 MED ORDER — METOPROLOL TARTRATE 25 MG PO TABS
25.0000 mg | ORAL_TABLET | Freq: Every day | ORAL | Status: DC
  Administered 2017-05-04: 25 mg via ORAL
  Filled 2017-05-03: qty 1

## 2017-05-03 MED ORDER — LABETALOL HCL 5 MG/ML IV SOLN
10.0000 mg | Freq: Once | INTRAVENOUS | Status: AC
  Administered 2017-05-03: 10 mg via INTRAVENOUS
  Filled 2017-05-03: qty 4

## 2017-05-03 MED ORDER — SODIUM CHLORIDE 0.9 % IV SOLN
INTRAVENOUS | Status: DC
Start: 2017-05-03 — End: 2017-05-05
  Administered 2017-05-04 – 2017-05-05 (×2): via INTRAVENOUS

## 2017-05-03 MED ORDER — BACLOFEN 10 MG PO TABS
10.0000 mg | ORAL_TABLET | Freq: Every day | ORAL | Status: DC
  Administered 2017-05-04 (×2): 10 mg via ORAL
  Filled 2017-05-03 (×5): qty 1

## 2017-05-03 MED ORDER — PROMETHAZINE HCL 25 MG/ML IJ SOLN
25.0000 mg | Freq: Once | INTRAMUSCULAR | Status: AC
  Administered 2017-05-03: 25 mg via INTRAVENOUS
  Filled 2017-05-03: qty 1

## 2017-05-03 MED ORDER — IOPAMIDOL (ISOVUE-300) INJECTION 61%
100.0000 mL | Freq: Once | INTRAVENOUS | Status: AC | PRN
  Administered 2017-05-03: 100 mL via INTRAVENOUS

## 2017-05-03 NOTE — ED Notes (Signed)
Pt was given Sprite to drink. Pt was able to keep some down and then began to feel nauseated.

## 2017-05-03 NOTE — ED Provider Notes (Signed)
[facility] EMERGENCY DEPARTMENT Provider Note   CSN: [id number] Arrival date & time: 05/03/17  1350     History   Chief Complaint Chief Complaint  Patient presents with  . Emesis  . Diarrhea    HPI [name] is a 56 y.o. female.  HPI  56 year old female presents with 4 days of vomiting and diarrhea.  She has not been able to keep anything down including water.  She has not been able to take her medicines.  This is similar to when she was here a few months ago.  She is having some right-sided abdominal pain that started after the vomiting.  Whenever the nausea spikes it seems to cause an increase in pain.  She has been having multiple episodes of loose stools without blood.  No hematemesis.  Abdominal pain feels like someone is squeezing her abdomen.  Has had hot and cold spells.  No recent antibiotics or travel.  No one else around her is sick.  She has also been feeling a tightness and pressure to her chest that has been intermittent since the vomiting started.  Some shortness of breath.  She has not been able to smoke due to all the symptoms.  Past Medical History:  Diagnosis Date  . Anxiety   . Asthma   . COPD (chronic obstructive pulmonary disease) ([place])   . Depression   . Disc degeneration, lumbar   . GERD (gastroesophageal reflux disease)   . Hypertension   . Leukocytosis   . Lumbar disc disease   . Shortness of breath   . Thrombocytosis [facility])     Patient Active Problem List   Diagnosis Date Noted  . RUQ pain 11/20/2016  . Abnormal LFTs 11/20/2016  . Dysphagia, pharyngoesophageal phase 03/10/2014  . Constipation 03/09/2014  . FH: colon cancer in first degree relative <54 years old 03/09/2014  . 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  . Folic acid deficiency 37/90/2409  . Leukocytosis 01/01/2012  . Thrombocytosis ([place])   . TRANSAMINASES, SERUM, ELEVATED 10/24/2009  . HEMATEMESIS 06/28/2009  . HEMATOCHEZIA 03/07/2009  .  Nausea with vomiting 03/07/2009  . Diarrhea 03/07/2009  . ABDOMINAL PAIN 03/07/2009  . History of colonic polyps 03/07/2009  . FATTY LIVER DISEASE, HX OF 03/07/2009  . CARPAL TUNNEL SYNDROME 03/03/2008  . MONONEURITIS, LEG 03/03/2008    Past Surgical History:  Procedure Laterality Date  . ABDOMINAL HYSTERECTOMY    . BIOPSY  03/25/2014   Procedure: GASTRIC BIOPSIES;  Surgeon: Daneil Dolin, MD;  Location: [facility] ORS;  Service: Endoscopy;;  . BRAIN SURGERY    . COLONOSCOPY  02/21/2007   BDZ:HGDJ papilla and internal hemorrhoids, diminutive rectal polyp/Left-sided diverticula (sigmoid) pedunculated polyps mid descending  . COLONOSCOPY  2008   Dr. Gala Romney: segmental biopsy negative for microscopic colitis. Three polyps, one tubular adenoma. Surveillance due 2013.   Marland Kitchen COLONOSCOPY WITH PROPOFOL N/A 03/25/2014   RMR: Rectal and colonic polyps removed as described above.  Colonic diverticulosis. CT abnormality likely artifactual in orgin    . ESOPHAGOGASTRODUODENOSCOPY  08/08/2005   MEQ:ASTMHD esophagogastroduodenoscopy.  . ESOPHAGOGASTRODUODENOSCOPY  2011   Dr. Gala Romney: normal esophagus, small hiatal hernia, duodenal diverticulum, negative H.pylori and celiac  . 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 Abnormal gastic mucosa of uncertain significance-status post gasrtric biopsy  . MALONEY DILATION N/A 03/25/2014   Procedure: MALONEY ESOPHAGEAL DILATION #54 Fr;  Surgeon: Daneil Dolin,  MD;  Location: [facility]S;  Service: Endoscopy;  Laterality: N/A;  . POLYPECTOMY  03/25/2014   Procedure: SIGMOID AND RECTAL POLYPECTOMY;  Surgeon: Daneil Dolin, MD;  Location: [facility];  Service: Endoscopy;;  . right knee surgery    . rt breast biopsy    . TOTAL ABDOMINAL HYSTERECTOMY W/ BILATERAL SALPINGOOPHORECTOMY     age 63    OB History    No data available       Home Medications    Prior to  Admission medications   Medication Sig Start Date End Date Taking? Authorizing Provider  ALPRAZolam Duanne Moron) 1 MG tablet Take 1 mg by mouth 3 (three) times daily as needed for anxiety.    [provider]  baclofen (LIORESAL) 10 MG tablet Take 10 mg by mouth at bedtime. May take up to 3 times daily for Muscle spasms    [provider]  ciprofloxacin (CIPRO) 500 MG tablet Take 1 tablet (500 mg total) by mouth 2 (two) times daily. One po bid x 7 days 12/18/16   Daleen Bo, MD  DULoxetine (CYMBALTA) 60 MG capsule Take 60 mg by mouth at bedtime.     [provider]  estradiol (ESTRACE) 1 MG tablet Take 1 mg by mouth daily.    [provider]  fentaNYL (DURAGESIC - DOSED MCG/HR) 25 MCG/HR patch Place 1 patch onto the skin every 3 (three) days. 10/30/16   [provider]  furosemide (LASIX) 20 MG tablet Take 20 mg by mouth daily. 12/10/16   [provider]  levothyroxine (SYNTHROID, LEVOTHROID) 25 MCG tablet Take 25 mcg by mouth daily. 10/30/16   [provider]  lubiprostone (AMITIZA) 24 MCG capsule Take 1 capsule (24 mcg total) by mouth 2 (two) times daily with a meal. 11/20/16   Mahala Menghini, PA-C  metoCLOPramide (REGLAN) 5 MG tablet Take 5 mg by mouth 4 (four) times daily -  before meals and at bedtime.     [provider]  metoprolol tartrate (LOPRESSOR) 25 MG tablet Take 25 mg by mouth daily.     [provider]  metroNIDAZOLE (FLAGYL) 500 MG tablet Take 1 tablet (500 mg total) by mouth 2 (two) times daily. One po bid x 7 days 12/18/16   Daleen Bo, MD  mometasone-formoterol [facility]) 100-5 MCG/ACT AERO Inhale 2 puffs into the lungs 2 (two) times daily.    [provider]  omeprazole (PRILOSEC) 20 MG capsule Take 20 mg by mouth at bedtime.     [provider]  ondansetron (ZOFRAN) 8 MG tablet Take 1 tablet (8 mg total) by mouth every 8 (eight) hours as needed for nausea or vomiting. 12/18/16   Daleen Bo, MD  oxycodone (ROXICODONE) 30 MG immediate release tablet Take 60 mg by mouth every 4 (four) hours.     [provider]  PROAIR HFA 108 (90 BASE) MCG/ACT inhaler Inhale 2 puffs into the lungs daily as needed for wheezing or shortness of breath.  09/07/13   [provider]  promethazine (PHENERGAN) 25 MG tablet Take 1 tablet (25 mg total) by mouth every 8 (eight) hours as needed for nausea or vomiting. Patient taking differently: Take 25 mg by mouth every 4 (four) hours.  12/09/13   Rolland Porter, MD  tiZANidine (ZANAFLEX) 4 MG tablet Take 4 mg by mouth at bedtime.  12/06/15   [provider]  venlafaxine (EFFEXOR) 75 MG tablet Take 75 mg by mouth daily.    [provider]  zolpidem (AMBIEN) 10 MG tablet Take 10 mg by mouth at bedtime.      [provider]    Family History Family History  Adopted: Yes  Problem Relation Age of Onset  . Pancreatic cancer Mother   . Hepatitis C Brother   . Liver cancer Brother        suicide  . Pancreatic cancer Maternal Aunt   . Kidney cancer Maternal Uncle   . Colon cancer Paternal Aunt   . Kidney cancer Paternal Grandfather   . Colon cancer Brother 54  . Leukemia Sister        1/2 sister  . Pancreatic cancer Paternal Grandmother   . Pancreatic cancer Maternal Uncle     Social History Social History   Tobacco Use  . Smoking status: Current Every Day Smoker    Packs/day: 1.00    Years: 30.00    Pack years: 30.00    Types: Cigarettes  . Smokeless tobacco: Never Used  Substance Use Topics  . Alcohol use: No    Comment: see HPI. Patient denies alcohol use at present  . Drug use: No     Allergies   Codeine; Latex; Lyrica [pregabalin]; Nsaids; and Other   Review of Systems Review of Systems  Constitutional: Positive for chills and fever (subjective).  Respiratory: Positive for shortness of breath.   Cardiovascular: Positive for chest pain.  Gastrointestinal: Positive for abdominal pain,  diarrhea, nausea and vomiting. Negative for blood in stool.  Genitourinary: Negative for dysuria.  All other systems reviewed and are negative.    Physical Exam Updated Vital Signs There were no vitals taken for this visit.  Physical Exam  Constitutional: She is oriented to person, place, and time. She appears well-developed and well-nourished.  HENT:  Head: Normocephalic and atraumatic.  Right Ear: External ear normal.  Left Ear: External ear normal.  Nose: Nose normal.  Mouth/Throat: Mucous membranes are dry.  Eyes: Right eye exhibits no discharge. Left eye exhibits no discharge.  Cardiovascular: Normal rate, regular rhythm and normal heart sounds.  Pulmonary/Chest: Effort normal and breath sounds normal. She has no wheezes. She has no rales.  Abdominal: Soft. She exhibits distension. There is tenderness (generalized).  Neurological: She is alert and oriented to person, place, and time.  Skin: Skin is warm and dry.  Nursing note and vitals reviewed.    ED Treatments / Results  Labs (all labs ordered are listed, but only abnormal results are displayed) Labs Reviewed  CBC WITH DIFFERENTIAL/PLATELET - Abnormal; Notable for the following components:      Result Value   WBC 16.4 (*)    Neutro Abs 13.8 (*)    All other components within normal limits  COMPREHENSIVE METABOLIC PANEL - Abnormal; Notable for the following components:   Glucose, Bld 115 (*)    Calcium 8.2 (*)    Total Protein 6.2 (*)    Albumin 2.5 (*)    ALT 11 (*)    Alkaline Phosphatase 604 (*)    All other components within normal limits  TROPONIN I - Abnormal; Notable for the following components:   Troponin I 0.07 (*)    All other components within normal limits  LACTATE DEHYDROGENASE, PLEURAL OR PERITONEAL FLUID - Abnormal; Notable for the following components:   LD, Fluid 41 (*)    All other components within normal limits  BODY FLUID CELL COUNT WITH DIFFERENTIAL - Abnormal; Notable for the following  components:   Color, Fluid YELLOW (*)  Monocyte-Macrophage-Serous Fluid 35 (*)    All other components within normal limits  GRAM STAIN  CULTURE, BODY FLUID-BOTTLE  LIPASE, BLOOD  GLUCOSE, PLEURAL OR PERITONEAL FLUID  PROTEIN, PLEURAL OR PERITONEAL FLUID  ALBUMIN, PLEURAL OR PERITONEAL FLUID  PROTIME-INR  URINALYSIS, ROUTINE W REFLEX MICROSCOPIC  PATHOLOGIST SMEAR REVIEW  TROPONIN I  TROPONIN I    EKG  EKG Interpretation  Date/Time:  Friday May 03 2017 15:13:59 EST Ventricular Rate:  110 PR Interval:    QRS Duration: 83 QT Interval:  363 QTC Calculation: 492 R Axis:   54 Text Interpretation:  Sinus tachycardia Anteroseptal infarct, age indeterminate Baseline wander in lead(s) V5 rate is faster, otherwise similar to 2015 Confirmed by Sherwood Gambler [phone]) on 05/03/2017 3:23:20 PM       Radiology Ct Abdomen Pelvis W Contrast  Result Date: 05/03/2017 CLINICAL DATA:  Nausea, vomiting and diarrhea for 4 days. Diarrhea over the last day. History of chronic obstructive pulmonary disease and hypertension. EXAM: CT ABDOMEN AND PELVIS WITH CONTRAST TECHNIQUE: Multidetector CT imaging of the abdomen and pelvis was performed using the standard protocol following bolus administration of intravenous contrast. CONTRAST:  149mL ISOVUE-300 IOPAMIDOL (ISOVUE-300) INJECTION 61% COMPARISON:  Abdominopelvic CT 12/17/2016. FINDINGS: Lower chest: There is a new small dependent left pleural effusion with associated mild left lower lobe atelectasis. No significant right pleural or pericardial effusion. There are stable small paracardiac nodes. Hepatobiliary: Re- demonstrated are changes of advanced hepatic cirrhosis. No focal hepatic lesions are identified. No evidence of gallstones, gallbladder wall thickening or biliary dilatation. Pancreas: Atrophied without focal abnormality, ductal dilatation or surrounding inflammation. Spleen: Normal in size without focal abnormality. Adrenals/Urinary  Tract: Both adrenal glands appear normal. The kidneys appear normal without evidence of urinary tract calculus, suspicious lesion or hydronephrosis. No bladder abnormalities are seen. Stomach/Bowel: Stable mild duodenal and colonic wall thickening, attributed to ascites and liver disease. No bowel distention or surrounding focal inflammation. Vascular/Lymphatic: Stable small lymph nodes in the porta hepatis. There is no retroperitoneal adenopathy. There is mild aortic and branch vessel atherosclerosis. The portal, superior mesenteric and splenic veins are patent. The left paraumbilical vein is recanalized. Reproductive: Hysterectomy.  No evidence of adnexal mass. Other: There is a large amount of ascites which has mildly increased in volume compared with the prior study. No focal extraluminal fluid collections or peritoneal implants are seen. There is edema throughout the mesenteric fat. The anterior abdominal wall appears intact. Musculoskeletal: No acute or significant osseous findings. IMPRESSION: 1. No definite suggested acute abdominopelvic findings. 2. Morphologic changes of cirrhosis with recanalization of the left paraumbilical vein and progressive ascites. Consider paracentesis for further evaluation. 3. New small left pleural effusion, possibly due to communication with the peritoneal cavity. 4. Similar duodenal and colonic wall thickening, likely related to chronic liver disease and ascites. 5.  Aortic Atherosclerosis (ICD10-I70.0). Electronically Signed   By: Richardean Sale M.D.   On: 05/03/2017 16:02   US Paracentesis  Result Date: 05/03/2017 INDICATION: Ascites EXAM: ULTRASOUND GUIDED DIAGNOSTIC AND THERAPEUTIC PARACENTESIS MEDICATIONS: None. COMPLICATIONS: None immediate. PROCEDURE: Procedure, benefits, and risks of procedure were discussed with patient. Written informed consent for procedure was obtained. Time out protocol followed. Adequate collection of ascites localized by ultrasound in  RIGHT lower quadrant. Skin prepped and draped in usual sterile fashion. Skin and soft tissues anesthetized with 10 mL of 1% lidocaine. 5 [place] Yueh catheter placed into peritoneal cavity. 4.18 L of amber colored ascitic fluid aspirated by vacuum bottle suction. Procedure tolerated well by  patient without immediate complication. FINDINGS: A total of approximately 4.18 L of ascitic fluid was removed. Samples were sent to the laboratory as requested by the clinical team. IMPRESSION: Successful ultrasound-guided paracentesis yielding 4.18 liters of peritoneal fluid. Electronically Signed   By: Lavonia Dana M.D.   On: 05/03/2017 17:44    Procedures .Critical Care Performed by: Sherwood Gambler, MD Authorized by: Sherwood Gambler, MD   Critical care provider statement:    Critical care time (minutes):  30   Critical care was necessary to treat or prevent imminent or life-threatening deterioration of the following conditions:  Cardiac failure, hepatic failure and dehydration   Critical care was time spent personally by me on the following activities:  Development of treatment plan with patient or surrogate, discussions with consultants, evaluation of patient's response to treatment, examination of patient, ordering and performing treatments and interventions, ordering and review of laboratory studies, ordering and review of radiographic studies, pulse oximetry, re-evaluation of patient's condition and review of old charts   (including critical care time)  Medications Ordered in ED Medications  aspirin suppository 300 mg (300 mg Rectal Not Given 05/03/17 2004)  promethazine (PHENERGAN) injection 25 mg (25 mg Intravenous Given 05/03/17 1502)  sodium chloride 0.9 % bolus 1,000 mL (0 mLs Intravenous Stopped 05/03/17 1631)  ondansetron (ZOFRAN) injection 4 mg (4 mg Intravenous Given 05/03/17 1523)  fentaNYL (SUBLIMAZE) injection 100 mcg (100 mcg Intravenous Given 05/03/17 1523)  iopamidol (ISOVUE-300) 61 %  injection 100 mL (100 mLs Intravenous Contrast Given 05/03/17 1532)  fentaNYL (SUBLIMAZE) injection 100 mcg (100 mcg Intravenous Given 05/03/17 1647)  ondansetron (ZOFRAN) injection 4 mg (4 mg Intravenous Given 05/03/17 1647)  metoCLOPramide (REGLAN) injection 10 mg (10 mg Intravenous Given 05/03/17 1952)  labetalol (NORMODYNE,TRANDATE) injection 10 mg (10 mg Intravenous Given 05/03/17 1953)     Initial Impression / Assessment and Plan / ED Course  I have reviewed the triage vital signs and the nursing notes.  Pertinent labs & imaging results that were available during my care of the patient were reviewed by me and considered in my medical decision making (see chart for details).     Patient's abdominal pain and distention appears to be coming from ascites.  A couple months ago she was diagnosed with cirrhosis by CT scan with some ascites but today it is significantly worse.  Ultrasound paracentesis performed by radiology with 4 L of ascitic fluid removed.  Evaluation of this fluid does not reveal SBP.  However despite the removal of fluid she is still having abdominal pain and still having inability to tolerate oral fluids.  I think this is driving her significant hypertension with blood pressures over 932 systolic.  She has had on and off atypical chest pain or shortness of breath.  I think the shortness of breath is probably of the pleural effusion seen on CT but she is not hypoxic.  However the chest pain is atypical and she does have a low level troponin elevation of 0.07.  Probably this is demand from hypertension given she is unable to take her meds.  Thus she will be started on some labetalol to lower her blood pressure.  She will need continued fluids and symptomatic management.  Probably will need a GI consult for the new onset cirrhosis and ascites.  Admit to the hospitalist.  Final Clinical Impressions(s) / ED Diagnoses   Final diagnoses:  Generalized abdominal pain  Other ascites    Cirrhosis of liver with ascites, unspecified hepatic cirrhosis type (  [facility])  Hypertensive emergency    ED Discharge Orders    None       Sherwood Gambler, MD 05/03/17 2112

## 2017-05-03 NOTE — H&P (Signed)
History and Physical  RAKISHA PINCOCK WPY:[id number] DOB: 01-16-1961 DOA: 05/03/2017  Referring physician: ER Physician, Dr. Regenia Skeeter PCP: Redmond School, MD  Outpatient Specialists:    Patient coming from: Home  Chief Complaint: Nausea, vomiting and diarrhea  HPI: Patient is a 56 year old Caucasian female with past medical history significant for obesity, COPD and chronic leukocytosis. Patient presents with 4-day history of nausea, vomiting and diarrhea. No associated fever or chills. No sick contact. Patient's home medication included Duloxetine and Venlafaxine. On presentation to the hospital, CT scan of abdomen and pelvis revealed liver cirrhosis with significant ascites. Patient is not known to have liver disease. No history of hepatitis, blood transfusion or illicit drug use. Patient has undergone 4.18L paracentesis for ascites and the Neutrophil count is only 7, but LDH is elevated. Blood pressure ranges from 176-213/89-101, WBC of 16.4 and Troponin of 0.07. Patient will be admitted for further assessment and management.  ED Course: Paracentesis done Pertinent labs: see above EKG:  Imaging: independently reviewed.   Review of Systems:  As in HPI. 12 point review of system done.   Past Medical History:  Diagnosis Date  . Anxiety   . Asthma   . COPD (chronic obstructive pulmonary disease) ([place])   . Depression   . Disc degeneration, lumbar   . GERD (gastroesophageal reflux disease)   . Hypertension   . Leukocytosis   . Lumbar disc disease   . Shortness of breath   . Thrombocytosis ([place])     Past Surgical History:  Procedure Laterality Date  . ABDOMINAL HYSTERECTOMY    . BIOPSY  03/25/2014   Procedure: GASTRIC BIOPSIES;  Surgeon: Daneil Dolin, MD;  Location: [facility];  Service: Endoscopy;;  . BRAIN SURGERY    . COLONOSCOPY  02/21/2007   KNL:[id number] papilla and internal hemorrhoids, diminutive rectal polyp/Left-sided diverticula (sigmoid) pedunculated polyps mid  descending  . COLONOSCOPY  2008   Dr. Gala Romney: segmental biopsy negative for microscopic colitis. Three polyps, one tubular adenoma. Surveillance due 2013.   Marland Kitchen COLONOSCOPY WITH PROPOFOL N/A 03/25/2014   RMR: Rectal and colonic polyps removed as described above.  Colonic diverticulosis. CT abnormality likely artifactual in orgin    . ESOPHAGOGASTRODUODENOSCOPY  08/08/2005   HAL:PFXTKW esophagogastroduodenoscopy.  . ESOPHAGOGASTRODUODENOSCOPY  2011   Dr. Gala Romney: normal esophagus, small hiatal hernia, duodenal diverticulum, negative H.pylori and celiac  . 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 Abnormal gastic mucosa of uncertain significance-status post gasrtric biopsy  . MALONEY DILATION N/A 03/25/2014   Procedure: MALONEY ESOPHAGEAL DILATION #54 Fr;  Surgeon: Daneil Dolin, MD;  Location: [facility];  Service: Endoscopy;  Laterality: N/A;  . POLYPECTOMY  03/25/2014   Procedure: SIGMOID AND RECTAL POLYPECTOMY;  Surgeon: Daneil Dolin, MD;  Location: [facility];  Service: Endoscopy;;  . right knee surgery    . rt breast biopsy    . TOTAL ABDOMINAL HYSTERECTOMY W/ BILATERAL SALPINGOOPHORECTOMY     age 54     reports that she has been smoking cigarettes.  She has a 30.00 pack-year smoking history. she has never used smokeless tobacco. She reports that she does not drink alcohol or use drugs.  Allergies  Allergen Reactions  . Codeine Hives and Other (See Comments)    Hallucinations  . Latex Hives and Itching  . Lyrica [Pregabalin] Hives  . Nsaids   . Other Hives and Itching    Oral vitamins  Family History  Adopted: Yes  Problem Relation Age of Onset  . Pancreatic cancer Mother   . Hepatitis C Brother   . Liver cancer Brother        suicide  . Pancreatic cancer Maternal Aunt   . Kidney cancer Maternal Uncle   . Colon cancer Paternal Aunt   . Kidney cancer Paternal Grandfather    . Colon cancer Brother 79  . Leukemia Sister        1/2 sister  . Pancreatic cancer Paternal Grandmother   . Pancreatic cancer Maternal Uncle      Prior to Admission medications   Medication Sig Start Date End Date Taking? Authorizing Provider  ALPRAZolam Duanne Moron) 1 MG tablet Take 1 mg by mouth 3 (three) times daily as needed for anxiety.   Yes [provider]  baclofen (LIORESAL) 10 MG tablet Take 10 mg by mouth at bedtime. May take up to 3 times daily for Muscle spasms   Yes [provider]  DULoxetine (CYMBALTA) 60 MG capsule Take 60 mg by mouth at bedtime.    Yes [provider]  estradiol (ESTRACE) 1 MG tablet Take 1 mg by mouth daily.   Yes [provider]  fentaNYL (DURAGESIC - DOSED MCG/HR) 25 MCG/HR patch Place 1 patch onto the skin every 3 (three) days. 10/30/16  Yes [provider]  furosemide (LASIX) 20 MG tablet Take 20 mg by mouth daily. 12/10/16  Yes [provider]  levothyroxine (SYNTHROID, LEVOTHROID) 25 MCG tablet Take 25 mcg by mouth daily. 10/30/16  Yes [provider]  metoCLOPramide (REGLAN) 5 MG tablet Take 5 mg by mouth 4 (four) times daily -  before meals and at bedtime.    Yes [provider]  metoprolol tartrate (LOPRESSOR) 25 MG tablet Take 25 mg by mouth daily.    Yes [provider]  mometasone-formoterol (DULERA) 100-5 MCG/ACT AERO Inhale 2 puffs into the lungs 2 (two) times daily.   Yes [provider]  omeprazole (PRILOSEC) 20 MG capsule Take 20 mg by mouth at bedtime.    Yes [provider]  oxycodone (ROXICODONE) 30 MG immediate release tablet Take 30 mg by mouth every 4 (four) hours as needed for pain.    Yes [provider]  PROAIR HFA 108 (90 BASE) MCG/ACT inhaler Inhale 2 puffs into the lungs daily as needed for wheezing or shortness of breath.  09/07/13  Yes [provider]  venlafaxine (EFFEXOR) 75 MG tablet Take 75 mg by mouth daily.   Yes  [provider]  zolpidem (AMBIEN) 10 MG tablet Take 10 mg by mouth at bedtime.     Yes [provider]  ciprofloxacin (CIPRO) 500 MG tablet Take 1 tablet (500 mg total) by mouth 2 (two) times daily. One po bid x 7 days Patient not taking: Reported on 05/03/2017 12/18/16   Daleen Bo, MD    Physical Exam: Vitals:   05/03/17 1709 05/03/17 1730 05/03/17 1800 05/03/17 2000  BP: (!) 191/97 (!) 207/100 (!) 213/101 (!) 186/84  Pulse: (!) 105 88 87 83  Resp: (!) 24 20 20  (!) 21  Temp:      TempSrc:      SpO2: 94% 98% 98% 96%    Constitutional:  . Appears calm and comfortable. Not in any distress (Patient seen after large volume paracentesis). Eyes:  . No pallor. No jaundice.  ENMT:  . external ears, nose appear normal Neck:  . Neck is supple. No JVD Respiratory:  .  CTA bilaterally, no w/r/r.  . Respiratory effort normal. No retractions or accessory muscle use Cardiovascular:  . S1S2 . No LE extremity edema   Abdomen:  . Abdomen is obese, soft and non tender. Organs are difficult to assess. Neurologic:  . Awake and alert. . Moves all limbs.  Wt Readings from Last 3 Encounters:  12/17/16 90.7 kg (200 lb)  11/20/16 88.8 kg (195 lb 12.8 oz)  12/27/15 81.6 kg (180 lb)    I have personally reviewed following labs and imaging studies  Labs on Admission:  CBC: Recent Labs  Lab 05/03/17 1452  WBC 16.4*  NEUTROABS 13.8*  HGB 12.9  HCT 40.0  MCV 90.7  PLT 852   Basic Metabolic Panel: Recent Labs  Lab 05/03/17 1452  NA 142  K 3.6  CL 109  CO2 25  GLUCOSE 115*  BUN 9  CREATININE 0.55  CALCIUM 8.2*   Liver Function Tests: Recent Labs  Lab 05/03/17 1452  AST 32  ALT 11*  ALKPHOS 604*  BILITOT 1.0  PROT 6.2*  ALBUMIN 2.5*   Recent Labs  Lab 05/03/17 1452  LIPASE 17   No results for input(s): AMMONIA in the last 168 hours. Coagulation Profile: Recent Labs  Lab 05/03/17 1800  INR 1.19   Cardiac Enzymes: Recent Labs  Lab  05/03/17 1452  TROPONINI 0.07*   BNP (last 3 results) No results for input(s): PROBNP in the last 8760 hours. HbA1C: No results for input(s): HGBA1C in the last 72 hours. CBG: No results for input(s): GLUCAP in the last 168 hours. Lipid Profile: No results for input(s): CHOL, HDL, LDLCALC, TRIG, CHOLHDL, LDLDIRECT in the last 72 hours. Thyroid Function Tests: No results for input(s): TSH, T4TOTAL, FREET4, T3FREE, THYROIDAB in the last 72 hours. Anemia Panel: No results for input(s): VITAMINB12, FOLATE, FERRITIN, TIBC, IRON, RETICCTPCT in the last 72 hours. Urine analysis:    Component Value Date/Time   COLORURINE AMBER (A) 12/17/2016 2131   APPEARANCEUR HAZY (A) 12/17/2016 2131   LABSPEC 1.030 12/17/2016 2131   PHURINE 6.0 12/17/2016 2131   GLUCOSEU NEGATIVE 12/17/2016 2131   HGBUR NEGATIVE 12/17/2016 2131   BILIRUBINUR SMALL (A) 12/17/2016 2131   KETONESUR NEGATIVE 12/17/2016 2131   PROTEINUR >=300 (A) 12/17/2016 2131   UROBILINOGEN 0.2 04/04/2009 2232   NITRITE NEGATIVE 12/17/2016 2131   LEUKOCYTESUR NEGATIVE 12/17/2016 2131   Sepsis Labs: @LABRCNTIP (procalcitonin:4,lacticidven:4) ) Recent Results (from the past 240 hour(s))  Gram stain     Status: None   Collection Time: 05/03/17  4:07 PM  Result Value Ref Range Status   Specimen Description PERITONEAL CAVITY  Final   Special Requests NONE  Final   Gram Stain   Final    CYTOSPIN SMEAR NO ORGANISMS SEEN WBC SEEN WBC PRESENT,BOTH PMN AND MONONUCLEAR    Report Status 05/03/2017 FINAL  Final      Radiological Exams on Admission: Ct Abdomen Pelvis W Contrast  Result Date: 05/03/2017 CLINICAL DATA:  Nausea, vomiting and diarrhea for 4 days. Diarrhea over the last day. History of chronic obstructive pulmonary disease and hypertension. EXAM: CT ABDOMEN AND PELVIS WITH CONTRAST TECHNIQUE: Multidetector CT imaging of the abdomen and pelvis was performed using the standard protocol following bolus administration of  intravenous contrast. CONTRAST:  129mL ISOVUE-300 IOPAMIDOL (ISOVUE-300) INJECTION 61% COMPARISON:  Abdominopelvic CT 12/17/2016. FINDINGS: Lower chest: There is a new small dependent left pleural effusion with associated mild left lower lobe atelectasis. No significant right pleural or pericardial effusion. There are stable small  paracardiac nodes. Hepatobiliary: Re- demonstrated are changes of advanced hepatic cirrhosis. No focal hepatic lesions are identified. No evidence of gallstones, gallbladder wall thickening or biliary dilatation. Pancreas: Atrophied without focal abnormality, ductal dilatation or surrounding inflammation. Spleen: Normal in size without focal abnormality. Adrenals/Urinary Tract: Both adrenal glands appear normal. The kidneys appear normal without evidence of urinary tract calculus, suspicious lesion or hydronephrosis. No bladder abnormalities are seen. Stomach/Bowel: Stable mild duodenal and colonic wall thickening, attributed to ascites and liver disease. No bowel distention or surrounding focal inflammation. Vascular/Lymphatic: Stable small lymph nodes in the porta hepatis. There is no retroperitoneal adenopathy. There is mild aortic and branch vessel atherosclerosis. The portal, superior mesenteric and splenic veins are patent. The left paraumbilical vein is recanalized. Reproductive: Hysterectomy.  No evidence of adnexal mass. Other: There is a large amount of ascites which has mildly increased in volume compared with the prior study. No focal extraluminal fluid collections or peritoneal implants are seen. There is edema throughout the mesenteric fat. The anterior abdominal wall appears intact. Musculoskeletal: No acute or significant osseous findings. IMPRESSION: 1. No definite suggested acute abdominopelvic findings. 2. Morphologic changes of cirrhosis with recanalization of the left paraumbilical vein and progressive ascites. Consider paracentesis for further evaluation. 3. New small  left pleural effusion, possibly due to communication with the peritoneal cavity. 4. Similar duodenal and colonic wall thickening, likely related to chronic liver disease and ascites. 5.  Aortic Atherosclerosis (ICD10-I70.0). Electronically Signed   By: Richardean Sale M.D.   On: 05/03/2017 16:02   US Paracentesis  Result Date: 05/03/2017 INDICATION: Ascites EXAM: ULTRASOUND GUIDED DIAGNOSTIC AND THERAPEUTIC PARACENTESIS MEDICATIONS: None. COMPLICATIONS: None immediate. PROCEDURE: Procedure, benefits, and risks of procedure were discussed with patient. Written informed consent for procedure was obtained. Time out protocol followed. Adequate collection of ascites localized by ultrasound in RIGHT lower quadrant. Skin prepped and draped in usual sterile fashion. Skin and soft tissues anesthetized with 10 mL of 1% lidocaine. 5 [place] Yueh catheter placed into peritoneal cavity. 4.18 L of amber colored ascitic fluid aspirated by vacuum bottle suction. Procedure tolerated well by patient without immediate complication. FINDINGS: A total of approximately 4.18 L of ascitic fluid was removed. Samples were sent to the laboratory as requested by the clinical team. IMPRESSION: Successful ultrasound-guided paracentesis yielding 4.18 liters of peritoneal fluid. Electronically Signed   By: Lavonia Dana M.D.   On: 05/03/2017 17:44     Active Problems:   Nausea and vomiting   Assessment/Plan 1. Nausea and vomiting 2. Diarrhea 3. Volume depletion 4. Liver cirrhosis 5. Ascites S/P paracentesis 6. Hypertensive urgency 7. Obesity 8. Abnormal LFT's     Admit patient  Patient is S/P Paracentesis  Ascitic fluid analysis  GI Consult  Acute hepatitis profile  Lipid profile  Optimize BP control  Stool analysis  Supportive management for nausea and vomiting  Cautious Hydration  Further management will depend on hospital course  DVT prophylaxis: Lovenox Code Status: Full Family Communication:    Disposition Plan: Home eventually   Consults called: GI   Admission status: Inpatient    Time spent: 61 minutes  Dana Allan, MD  [facility] Pager #: [phone] 7PM-7AM contact night coverage as above   05/03/2017, 8:24 PM

## 2017-05-03 NOTE — ED Notes (Signed)
Drink and socks given

## 2017-05-03 NOTE — ED Notes (Signed)
Dr Thornton Papas in to perform US paracentesis.

## 2017-05-03 NOTE — ED Notes (Signed)
CRITICAL VALUE ALERT  Critical Value:  Troponin 0.07  Date & Time Notied:  05/03/17  Provider Notified: Dr. Regenia Skeeter  Orders Received/Actions taken: Dr. Regenia Skeeter to room

## 2017-05-03 NOTE — Procedures (Signed)
PreOperative Dx: Ascites Postoperative Dx: Ascites Procedure:   US guided paracentesis Radiologist:  Thornton Papas Anesthesia:  10 ml of1% lidocaine Specimen:  4.18 L of amber colored ascitic fluid EBL:   < 1 ml Complications: None

## 2017-05-03 NOTE — ED Triage Notes (Addendum)
N/v/d for 4 days. A/o. Diarrhea x 8 in past 24 hrs per pt. Mm moist.

## 2017-05-03 NOTE — ED Notes (Addendum)
Dr. Marthenia Rolling made aware of pt's 2nd troponin. Orders for pt to be on the monitor

## 2017-05-03 NOTE — ED Notes (Signed)
4L of amber fluid pulled off abd

## 2017-05-04 ENCOUNTER — Other Ambulatory Visit: Payer: Self-pay

## 2017-05-04 DIAGNOSIS — K7460 Unspecified cirrhosis of liver: Secondary | ICD-10-CM

## 2017-05-04 DIAGNOSIS — E039 Hypothyroidism, unspecified: Secondary | ICD-10-CM

## 2017-05-04 DIAGNOSIS — K7290 Hepatic failure, unspecified without coma: Secondary | ICD-10-CM | POA: Diagnosis present

## 2017-05-04 DIAGNOSIS — I160 Hypertensive urgency: Secondary | ICD-10-CM

## 2017-05-04 DIAGNOSIS — K7469 Other cirrhosis of liver: Secondary | ICD-10-CM

## 2017-05-04 DIAGNOSIS — R778 Other specified abnormalities of plasma proteins: Secondary | ICD-10-CM | POA: Diagnosis present

## 2017-05-04 DIAGNOSIS — R748 Abnormal levels of other serum enzymes: Secondary | ICD-10-CM

## 2017-05-04 DIAGNOSIS — R7989 Other specified abnormal findings of blood chemistry: Secondary | ICD-10-CM

## 2017-05-04 DIAGNOSIS — R188 Other ascites: Secondary | ICD-10-CM

## 2017-05-04 DIAGNOSIS — R112 Nausea with vomiting, unspecified: Secondary | ICD-10-CM

## 2017-05-04 DIAGNOSIS — E782 Mixed hyperlipidemia: Secondary | ICD-10-CM | POA: Diagnosis not present

## 2017-05-04 DIAGNOSIS — G8929 Other chronic pain: Secondary | ICD-10-CM

## 2017-05-04 DIAGNOSIS — G894 Chronic pain syndrome: Secondary | ICD-10-CM

## 2017-05-04 DIAGNOSIS — K219 Gastro-esophageal reflux disease without esophagitis: Secondary | ICD-10-CM | POA: Diagnosis present

## 2017-05-04 LAB — TROPONIN I: Troponin I: 0.54 ng/mL (ref <0.03)

## 2017-05-04 LAB — TSH: TSH: 1.842 u[IU]/mL (ref 0.350–4.500)

## 2017-05-04 LAB — COMPREHENSIVE METABOLIC PANEL
ALT: 14 U/L (ref 14–54)
AST: 44 U/L — ABNORMAL HIGH (ref 15–41)
Albumin: 2.2 g/dL — ABNORMAL LOW (ref 3.5–5.0)
Alkaline Phosphatase: 542 U/L — ABNORMAL HIGH (ref 38–126)
Anion gap: 5 (ref 5–15)
BUN: 20 mg/dL (ref 6–20)
CO2: 24 mmol/L (ref 22–32)
Calcium: 7.1 mg/dL — ABNORMAL LOW (ref 8.9–10.3)
Chloride: 105 mmol/L (ref 101–111)
Creatinine, Ser: 0.67 mg/dL (ref 0.44–1.00)
GFR calc Af Amer: >60 mL/min (ref >60)
GFR calc non Af Amer: >60 mL/min (ref >60)
Glucose, Bld: 87 mg/dL (ref 65–99)
Potassium: 2.8 mmol/L — ABNORMAL LOW (ref 3.5–5.1)
Sodium: 134 mmol/L — ABNORMAL LOW (ref 135–145)
Total Bilirubin: 0.7 mg/dL (ref 0.3–1.2)
Total Protein: 5.5 g/dL — ABNORMAL LOW (ref 6.5–8.1)

## 2017-05-04 LAB — CBC
HCT: 37.9 % (ref 36.0–46.0)
Hemoglobin: 12.3 g/dL (ref 12.0–15.0)
MCH: 29.4 pg (ref 26.0–34.0)
MCHC: 32.5 g/dL (ref 30.0–36.0)
MCV: 90.5 fL (ref 78.0–100.0)
Platelets: 425 10*3/uL — ABNORMAL HIGH (ref 150–400)
RBC: 4.19 MIL/uL (ref 3.87–5.11)
RDW: 15.4 % (ref 11.5–15.5)
WBC: 23.4 10*3/uL — ABNORMAL HIGH (ref 4.0–10.5)

## 2017-05-04 LAB — C DIFFICILE QUICK SCREEN W PCR REFLEX
C Diff antigen: NEGATIVE
C Diff interpretation: NOT DETECTED
C Diff toxin: NEGATIVE

## 2017-05-04 LAB — LIPID PANEL
Cholesterol: 260 mg/dL — ABNORMAL HIGH (ref 0–200)
HDL: 34 mg/dL — ABNORMAL LOW (ref >40)
LDL Cholesterol: 197 mg/dL — ABNORMAL HIGH (ref 0–99)
Total CHOL/HDL Ratio: 7.6 RATIO
Triglycerides: 147 mg/dL (ref <150)
VLDL: 29 mg/dL (ref 0–40)

## 2017-05-04 LAB — MRSA PCR SCREENING: MRSA by PCR: NEGATIVE

## 2017-05-04 MED ORDER — OXYCODONE HCL 5 MG PO TABS
30.0000 mg | ORAL_TABLET | ORAL | Status: DC | PRN
  Administered 2017-05-04 – 2017-05-05 (×7): 30 mg via ORAL
  Filled 2017-05-04 (×7): qty 6

## 2017-05-04 MED ORDER — MAGNESIUM SULFATE 2 GM/50ML IV SOLN
2.0000 g | Freq: Once | INTRAVENOUS | Status: AC
  Administered 2017-05-04: 2 g via INTRAVENOUS
  Filled 2017-05-04: qty 50

## 2017-05-04 MED ORDER — METOCLOPRAMIDE HCL 5 MG/ML IJ SOLN
10.0000 mg | Freq: Four times a day (QID) | INTRAMUSCULAR | Status: DC
  Administered 2017-05-04 – 2017-05-05 (×5): 10 mg via INTRAVENOUS
  Filled 2017-05-04 (×5): qty 2

## 2017-05-04 MED ORDER — PANTOPRAZOLE SODIUM 40 MG IV SOLR
40.0000 mg | Freq: Two times a day (BID) | INTRAVENOUS | Status: DC
  Administered 2017-05-04 – 2017-05-05 (×3): 40 mg via INTRAVENOUS
  Filled 2017-05-04 (×3): qty 40

## 2017-05-04 MED ORDER — METOPROLOL TARTRATE 25 MG PO TABS
25.0000 mg | ORAL_TABLET | Freq: Two times a day (BID) | ORAL | Status: DC
Start: 2017-05-04 — End: 2017-05-05
  Administered 2017-05-04 – 2017-05-05 (×2): 25 mg via ORAL
  Filled 2017-05-04 (×2): qty 1

## 2017-05-04 NOTE — Progress Notes (Signed)
PROGRESS NOTE    EARL ZELLMER  ASN:[id number] DOB: Jan 16, 1961 DOA: 05/03/2017 PCP: Redmond School, MD    Brief Narrative:  56 year old female presents to the hospital with nausea, vomiting and diarrhea.  She had abdominal pain and distention.  Found to have evidence of cirrhosis and ascites on image underwent paracentesis with removal of 4.1.  She was also noted to be markedly hypertensive.  She was admitted for supportive management of GI symptoms, GI consultation for liver issues, as well as management of blood pressure.   Assessment & Plan:   Active Problems:   Abdominal pain   Nausea and vomiting   Hypertensive urgency   Elevated troponin   Other cirrhosis of liver (HCC)   Ascites   Chronic pain   GERD (gastroesophageal reflux disease)   Hypothyroidism   1. Nausea, vomiting and diarrhea.  Possibly viral gastroenteritis.  Stool for C. difficile is negative.  GI pathogen panel pending.  Treat supportively.  Overall diarrhea is better.  Treat vomiting with antiemetics.  Start on clear liquids and advance as tolerated. 2. Cirrhosis with ascites.  Hepatitis panel has been ordered.  She denies any history of alcohol use.  She underwent paracentesis yesterday with removal of 4 L of fluid.  GI has been consulted. 3. Hypothyroidism.  Continue on Synthroid 4. GERD.  Continue PPI. 5. Hypertensive urgency.  Blood pressure was elevated over 200 on admission.  She is continued on daily metoprolol, blood pressure remains elevated.  We will change this to twice daily and use hydralazine as needed. 6. Elevated troponin.  No evidence of typical chest pain.  Suspect this is related to severe hypertension.  Echocardiogram is been ordered. 7. Chronic pain.  Patient reports chronic back pain for which she takes oxycodone and baclofen.  Continue home dose of medications.   DVT prophylaxis: Lovenox Code Status: Full code Family Communication: No family present Disposition Plan: Discharge home  once improved   Consultants:     Procedures:     Antimicrobials:      Subjective: Overall nausea and vomiting is better.  Continues to have some abdominal pain, has chronic back pain.  Was having diarrhea prior to admission which has since resolved.  Wants to advance diet.  Requesting further pain and nausea medicine  Objective: Vitals:   05/04/17 0827 05/04/17 0900 05/04/17 1000 05/04/17 1100  BP:      Pulse:  85 90 97  Resp:  19 18 17   Temp:      TempSrc:      SpO2: 96% 95% 96% 96%  Weight:      Height:        Intake/Output Summary (Last 24 hours) at 05/04/2017 1152 Last data filed at 05/04/2017 0900 Gross per 24 hour  Intake 1443.33 ml  Output -  Net 1443.33 ml   Filed Weights   05/03/17 2352 05/04/17 0400  Weight: 87.3 kg (192 lb 7.4 oz) 87.5 kg (192 lb 14.4 oz)    Examination:  General exam: Appears calm and comfortable  Respiratory system: Clear to auscultation. Respiratory effort normal. Cardiovascular system: S1 & S2 heard, RRR. No JVD, murmurs, rubs, gallops or clicks. No pedal edema. Gastrointestinal system: Abdomen is distended, soft and nontender. No organomegaly or masses felt. Normal bowel sounds heard. Central nervous system: Alert and oriented. No focal neurological deficits. Extremities: Symmetric 5 x 5 power. Skin: No rashes, lesions or ulcers Psychiatry: Judgement and insight appear normal. Mood & affect appropriate.     Data Reviewed:  I have personally reviewed following labs and imaging studies  CBC: Recent Labs  Lab 05/03/17 1452  WBC 16.4*  NEUTROABS 13.8*  HGB 12.9  HCT 40.0  MCV 90.7  PLT 161   Basic Metabolic Panel: Recent Labs  Lab 05/03/17 1452  NA 142  K 3.6  CL 109  CO2 25  GLUCOSE 115*  BUN 9  CREATININE 0.55  CALCIUM 8.2*  MG 1.6*  PHOS 4.2   GFR: Estimated Creatinine Clearance: 88.6 mL/min (by C-G formula based on SCr of 0.55 mg/dL). Liver Function Tests: Recent Labs  Lab 05/03/17 1452  AST 32    ALT 11*  ALKPHOS 604*  BILITOT 1.0  PROT 6.2*  ALBUMIN 2.5*   Recent Labs  Lab 05/03/17 1452  LIPASE 17   No results for input(s): AMMONIA in the last 168 hours. Coagulation Profile: Recent Labs  Lab 05/03/17 1800  INR 1.19   Cardiac Enzymes: Recent Labs  Lab 05/03/17 1452 05/03/17 2054 05/03/17 2256  TROPONINI 0.07* 0.13* 0.19*   BNP (last 3 results) No results for input(s): PROBNP in the last 8760 hours. HbA1C: No results for input(s): HGBA1C in the last 72 hours. CBG: No results for input(s): GLUCAP in the last 168 hours. Lipid Profile: No results for input(s): CHOL, HDL, LDLCALC, TRIG, CHOLHDL, LDLDIRECT in the last 72 hours. Thyroid Function Tests: No results for input(s): TSH, T4TOTAL, FREET4, T3FREE, THYROIDAB in the last 72 hours. Anemia Panel: No results for input(s): VITAMINB12, FOLATE, FERRITIN, TIBC, IRON, RETICCTPCT in the last 72 hours. Sepsis Labs: No results for input(s): PROCALCITON, LATICACIDVEN in the last 168 hours.  Recent Results (from the past 240 hour(s))  Gram stain     Status: None   Collection Time: 05/03/17  4:07 PM  Result Value Ref Range Status   Specimen Description PERITONEAL CAVITY  Final   Special Requests NONE  Final   Gram Stain   Final    CYTOSPIN SMEAR NO ORGANISMS SEEN WBC SEEN WBC PRESENT,BOTH PMN AND MONONUCLEAR    Report Status 05/03/2017 FINAL  Final  Culture, body fluid-bottle     Status: None (Preliminary result)   Collection Time: 05/03/17  4:30 PM  Result Value Ref Range Status   Specimen Description PERITONEAL CAVITY  Final   Special Requests PERITONEAL CAVITY  Final   Culture NO GROWTH < 24 HOURS  Final   Report Status PENDING  Incomplete  MRSA PCR Screening     Status: None   Collection Time: 05/04/17 12:39 AM  Result Value Ref Range Status   MRSA by PCR NEGATIVE NEGATIVE Final    Comment:        The GeneXpert MRSA Assay (FDA approved for NASAL specimens only), is one component of a comprehensive  MRSA colonization surveillance program. It is not intended to diagnose MRSA infection nor to guide or monitor treatment for MRSA infections.   C difficile quick scan w PCR reflex     Status: None   Collection Time: 05/04/17  8:30 AM  Result Value Ref Range Status   C Diff antigen NEGATIVE NEGATIVE Final   C Diff toxin NEGATIVE NEGATIVE Final   C Diff interpretation No C. difficile detected.  Final         Radiology Studies: Ct Abdomen Pelvis W Contrast  Result Date: 05/03/2017 CLINICAL DATA:  Nausea, vomiting and diarrhea for 4 days. Diarrhea over the last day. History of chronic obstructive pulmonary disease and hypertension. EXAM: CT ABDOMEN AND PELVIS WITH CONTRAST TECHNIQUE: Multidetector  CT imaging of the abdomen and pelvis was performed using the standard protocol following bolus administration of intravenous contrast. CONTRAST:  151mL ISOVUE-300 IOPAMIDOL (ISOVUE-300) INJECTION 61% COMPARISON:  Abdominopelvic CT 12/17/2016. FINDINGS: Lower chest: There is a new small dependent left pleural effusion with associated mild left lower lobe atelectasis. No significant right pleural or pericardial effusion. There are stable small paracardiac nodes. Hepatobiliary: Re- demonstrated are changes of advanced hepatic cirrhosis. No focal hepatic lesions are identified. No evidence of gallstones, gallbladder wall thickening or biliary dilatation. Pancreas: Atrophied without focal abnormality, ductal dilatation or surrounding inflammation. Spleen: Normal in size without focal abnormality. Adrenals/Urinary Tract: Both adrenal glands appear normal. The kidneys appear normal without evidence of urinary tract calculus, suspicious lesion or hydronephrosis. No bladder abnormalities are seen. Stomach/Bowel: Stable mild duodenal and colonic wall thickening, attributed to ascites and liver disease. No bowel distention or surrounding focal inflammation. Vascular/Lymphatic: Stable small lymph nodes in the porta  hepatis. There is no retroperitoneal adenopathy. There is mild aortic and branch vessel atherosclerosis. The portal, superior mesenteric and splenic veins are patent. The left paraumbilical vein is recanalized. Reproductive: Hysterectomy.  No evidence of adnexal mass. Other: There is a large amount of ascites which has mildly increased in volume compared with the prior study. No focal extraluminal fluid collections or peritoneal implants are seen. There is edema throughout the mesenteric fat. The anterior abdominal wall appears intact. Musculoskeletal: No acute or significant osseous findings. IMPRESSION: 1. No definite suggested acute abdominopelvic findings. 2. Morphologic changes of cirrhosis with recanalization of the left paraumbilical vein and progressive ascites. Consider paracentesis for further evaluation. 3. New small left pleural effusion, possibly due to communication with the peritoneal cavity. 4. Similar duodenal and colonic wall thickening, likely related to chronic liver disease and ascites. 5.  Aortic Atherosclerosis (ICD10-I70.0). Electronically Signed   By: Richardean Sale M.D.   On: 05/03/2017 16:02   US Paracentesis  Result Date: 05/03/2017 INDICATION: Ascites EXAM: ULTRASOUND GUIDED DIAGNOSTIC AND THERAPEUTIC PARACENTESIS MEDICATIONS: None. COMPLICATIONS: None immediate. PROCEDURE: Procedure, benefits, and risks of procedure were discussed with patient. Written informed consent for procedure was obtained. Time out protocol followed. Adequate collection of ascites localized by ultrasound in RIGHT lower quadrant. Skin prepped and draped in usual sterile fashion. Skin and soft tissues anesthetized with 10 mL of 1% lidocaine. 5 [place] Yueh catheter placed into peritoneal cavity. 4.18 L of amber colored ascitic fluid aspirated by vacuum bottle suction. Procedure tolerated well by patient without immediate complication. FINDINGS: A total of approximately 4.18 L of ascitic fluid was removed.  Samples were sent to the laboratory as requested by the clinical team. IMPRESSION: Successful ultrasound-guided paracentesis yielding 4.18 liters of peritoneal fluid. Electronically Signed   By: Lavonia Dana M.D.   On: 05/03/2017 17:44        Scheduled Meds: . aspirin  300 mg Rectal Once  . baclofen  10 mg Oral QHS  . enoxaparin (LOVENOX) injection  40 mg Subcutaneous Q24H  . estradiol  1 mg Oral Daily  . levothyroxine  25 mcg Oral QAC breakfast  . metoCLOPramide (REGLAN) injection  10 mg Intravenous Q6H  . metoprolol tartrate  25 mg Oral Daily  . mometasone-formoterol  2 puff Inhalation BID  . pantoprazole (PROTONIX) IV  40 mg Intravenous Q12H   Continuous Infusions: . sodium chloride 50 mL/hr at 05/04/17 0700  . magnesium sulfate 1 - 4 g bolus IVPB       LOS: 1 day    Time spent: 54mins  Kathie Dike, MD [facility] Pager [phone]  If 7PM-7AM, please contact night-coverage [email or web address] Password [email or web address] 05/04/2017, 11:52 AM

## 2017-05-04 NOTE — Consult Note (Signed)
Referring Provider: Kathie Dike, MD Primary Care Physician:  Redmond School, MD Primary Gastroenterologist:  Dr. Gala Romney.  Reason for Consultation:    Nausea vomiting diarrhea and abdominal pain. Cirrhosis with ascites.  HPI:   Patient is a 56 year old Caucasian female who presented to emergency room yesterday with 5-day history of nausea vomiting diarrhea as well as abdominal pain but she has had abdominal pain for several years.  On evaluation she was noted to have new onset of ascites.  She underwent abdominal paracentesis by Dr. Thornton Papas with removal of over 4 L of fluid.  She felt much better.  Analysis on fluid negative for spontaneous bacterial peritonitis.  Patient states that she was told by Dr. first a few months ago that she had fatty liver but she has never experienced hepatitis and is not aware that her transaminases are dated. She states over a period of few months she gained 40 pounds.  She noted progressive abdominal distention and lower extremity edema.  She doubled up on her furosemide and lost few pounds. She has not experienced hematemesis melena or rectal bleeding.  Before onset of diarrhea she was having formed stools daily.  She felt cold and warm but did not check her temperature.  This afternoon she feels better.  She is hungry.  She has not vomited today and she had movement when she passed small amount of stool. Complains of pain which is primarily across the lower abdomen but centered in right lower quadrant.  She states she had recalls the pain started when she was a teenager.  Over the years she has been evaluated multiple times with CTs and other studies including EGD colonoscopies as well as endoscopic ultrasound in September 2008.  She has noted worsening pain when she gets upset or feels more depressed.  She feels pain control has been decent with pain medication which she has been taking every 4 hours.  She has never drank alcohol.  He smokes about a pack of  cigarettes daily.  She did receive hepatitis a and B vaccination several years ago. 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  Details of family history are not available as she was adopted.  She did know her biologic brother who was 31 months younger.  About 7 months ago he committed suicide.  He apparently had liver disease but he used drugs in consume alcohol in excessive amount.   Past Medical History:  Diagnosis Date  . Anxiety   . Asthma   . COPD (chronic obstructive pulmonary disease) ([place])   . Depression   . Disc degeneration, lumbar   . GERD (gastroesophageal reflux disease)   . Hypertension   . Leukocytosis   . Lumbar disc disease   . Shortness of breath   . Thrombocytosis ([place])     Past Surgical History:  Procedure Laterality Date  . ABDOMINAL HYSTERECTOMY    . BIOPSY  03/25/2014   Procedure: GASTRIC BIOPSIES;  Surgeon: Daneil Dolin, MD;  Location: [facility];  Service: Endoscopy;;  . BRAIN SURGERY    . COLONOSCOPY  02/21/2007   KGU:RKYH papilla and internal hemorrhoids, diminutive rectal polyp/Left-sided diverticula (sigmoid) pedunculated polyps mid descending  . COLONOSCOPY  2008   Dr. Gala Romney: segmental biopsy negative for microscopic colitis. Three polyps, one tubular adenoma. Surveillance due 2013.   Marland Kitchen  COLONOSCOPY WITH PROPOFOL N/A 03/25/2014   RMR: Rectal and colonic polyps removed as described above.  Colonic diverticulosis. CT abnormality likely artifactual in orgin    . ESOPHAGOGASTRODUODENOSCOPY  08/08/2005   WNU:UVOZDG esophagogastroduodenoscopy.  . ESOPHAGOGASTRODUODENOSCOPY  2011   Dr. Gala Romney: normal esophagus, small hiatal hernia, duodenal diverticulum, negative H.pylori and celiac  . ESOPHAGOGASTRODUODENOSCOPY (EGD) WITH PROPOFOL N/A 03/25/2014   RMR: Probable occult cervical esophageal web- status post  dilation as described above. Abnormal gastic mucosa of uncertain significance-status post gasrtric biopsy  . MALONEY DILATION N/A 03/25/2014   Procedure: MALONEY ESOPHAGEAL DILATION #54 Fr;  Surgeon: Daneil Dolin, MD;  Location: [facility];  Service: Endoscopy;  Laterality: N/A;  . POLYPECTOMY  03/25/2014   Procedure: SIGMOID AND RECTAL POLYPECTOMY;  Surgeon: Daneil Dolin, MD;  Location: [facility];  Service: Endoscopy;;  . right knee surgery    . rt breast biopsy    . TOTAL ABDOMINAL HYSTERECTOMY W/ BILATERAL SALPINGOOPHORECTOMY     age 17    Prior to Admission medications   Medication Sig Start Date End Date Taking? Authorizing Provider  ALPRAZolam Duanne Moron) 1 MG tablet Take 1 mg by mouth 3 (three) times daily as needed for anxiety.   Yes [provider]  baclofen (LIORESAL) 10 MG tablet Take 10 mg by mouth at bedtime. May take up to 3 times daily for Muscle spasms   Yes [provider]  DULoxetine (CYMBALTA) 60 MG capsule Take 60 mg by mouth at bedtime.    Yes [provider]  estradiol (ESTRACE) 1 MG tablet Take 1 mg by mouth daily.   Yes [provider]  fentaNYL (DURAGESIC - DOSED MCG/HR) 25 MCG/HR patch Place 1 patch onto the skin every 3 (three) days. 10/30/16  Yes [provider]  furosemide (LASIX) 20 MG tablet Take 20 mg by mouth daily. 12/10/16  Yes [provider]  levothyroxine (SYNTHROID, LEVOTHROID) 25 MCG tablet Take 25 mcg by mouth daily. 10/30/16  Yes [provider]  metoCLOPramide (REGLAN) 5 MG tablet Take 5 mg by mouth 4 (four) times daily -  before meals and at bedtime.    Yes [provider]  metoprolol tartrate (LOPRESSOR) 25 MG tablet Take 25 mg by mouth daily.    Yes [provider]  mometasone-formoterol (DULERA) 100-5 MCG/ACT AERO Inhale 2 puffs into the lungs 2 (two) times daily.   Yes [provider]  omeprazole (PRILOSEC) 20 MG capsule Take 20 mg by mouth at bedtime.    Yes [provider]  oxycodone (ROXICODONE) 30 MG immediate release tablet Take 30 mg by mouth every 4 (four) hours as needed for pain.    Yes [provider]  PROAIR HFA 108 (90 BASE) MCG/ACT inhaler Inhale 2 puffs into the lungs daily as needed for wheezing or shortness of breath.  09/07/13  Yes [provider]  venlafaxine (EFFEXOR) 75 MG tablet Take 75 mg by mouth daily.   Yes [provider]  zolpidem (AMBIEN) 10 MG tablet Take 10 mg by mouth at bedtime.     Yes [provider]  ciprofloxacin (CIPRO) 500 MG tablet Take 1 tablet (500 mg total) by mouth 2 (two) times daily. One po bid x 7 days Patient not taking: Reported on 05/03/2017 12/18/16   Daleen Bo, MD    Current Facility-Administered Medications  Medication Dose Route Frequency Provider Last Rate Last Dose  . 0.9 %  sodium chloride infusion   Intravenous Continuous Bonnell Public, MD  50 mL/hr at 05/04/17 0700    . albuterol (PROVENTIL) (2.5 MG/3ML) 0.083% nebulizer solution 3 mL  3 mL Inhalation Daily PRN Dana Allan I, MD      . ALPRAZolam Duanne Moron) tablet 1 mg  1 mg Oral TID PRN Dana Allan I, MD   1 mg at 05/04/17 0007  . aspirin suppository 300 mg  300 mg Rectal Once Sherwood Gambler, MD      . baclofen (LIORESAL) tablet 10 mg  10 mg Oral QHS Dana Allan I, MD   10 mg at 05/04/17 0008  . enoxaparin (LOVENOX) injection 40 mg  40 mg Subcutaneous Q24H Dana Allan I, MD   40 mg at 05/04/17 1107  . estradiol (ESTRACE) tablet 1 mg  1 mg Oral Daily Dana Allan I, MD   1 mg at 05/04/17 1106  . hydrALAZINE (APRESOLINE) injection 10 mg  10 mg Intravenous Q4H PRN Dana Allan I, MD   10 mg at 05/04/17 0132  . labetalol (NORMODYNE,TRANDATE) injection 10 mg  10 mg Intravenous Q2H PRN Dana Allan I, MD   10 mg at 05/04/17 0651  . levothyroxine (SYNTHROID, LEVOTHROID) tablet 25 mcg  25 mcg Oral QAC breakfast Dana Allan I, MD   25 mcg at 05/04/17 0815  .  metoCLOPramide (REGLAN) injection 10 mg  10 mg Intravenous Q6H Kathie Dike, MD   10 mg at 05/04/17 1207  . metoprolol tartrate (LOPRESSOR) tablet 25 mg  25 mg Oral BID Kathie Dike, MD      . mometasone-formoterol (DULERA) 100-5 MCG/ACT inhaler 2 puff  2 puff Inhalation BID Dana Allan I, MD   2 puff at 05/04/17 0827  . ondansetron (ZOFRAN) injection 4 mg  4 mg Intravenous Q6H PRN Dana Allan I, MD   4 mg at 05/04/17 0008  . oxyCODONE (Oxy IR/ROXICODONE) immediate release tablet 30 mg  30 mg Oral Q4H PRN Kathie Dike, MD   30 mg at 05/04/17 1601  . pantoprazole (PROTONIX) injection 40 mg  40 mg Intravenous Q12H Kathie Dike, MD   40 mg at 05/04/17 1207    Allergies as of 05/03/2017 - Review Complete 05/03/2017  Allergen Reaction Noted  . Codeine Hives and Other (See Comments) 03/03/2008  . Latex Hives and Itching 02/23/2011  . Lyrica [pregabalin] Hives 11/20/2016  . Nsaids  11/20/2016  . Other Hives and Itching 02/23/2013    Family History  Adopted: Yes  Problem Relation Age of Onset  . Pancreatic cancer Mother   . Hepatitis C Brother   . Liver cancer Brother        suicide  . Pancreatic cancer Maternal Aunt   . Kidney cancer Maternal Uncle   . Colon cancer Paternal Aunt   . Kidney cancer Paternal Grandfather   . Colon cancer Brother 67  . Leukemia Sister        1/2 sister  . Pancreatic cancer Paternal Grandmother   . Pancreatic cancer Maternal Uncle     Social History   Socioeconomic History  . Marital status: Single    Spouse name: Not on file  . Number of children: Not on file  . Years of education: Not on file  . Highest education level: Not on file  Social Needs  . Financial resource strain: Not on file  . Food insecurity - worry: Not on file  . Food insecurity - inability: Not on file  . Transportation needs - medical: Not on file  . Transportation needs - non-medical: Not on file  Occupational  History  . Not on file  Tobacco Use  .  Smoking status: Current Every Day Smoker    Packs/day: 1.00    Years: 30.00    Pack years: 30.00    Types: Cigarettes  . Smokeless tobacco: Never Used  Substance and Sexual Activity  . Alcohol use: No    Comment: see HPI. Patient denies alcohol use at present  . Drug use: No  . Sexual activity: Not on file  Other Topics Concern  . Not on file  Social History Narrative  . Not on file    Review of Systems: See HPI, otherwise normal ROS  Physical Exam: Temp:  [98.9 F (37.2 C)-99.4 F (37.4 C)] 98.9 F (37.2 C) (12/01 1200) Pulse Rate:  [69-111] 80 (12/01 1500) Resp:  [15-28] 18 (12/01 1500) BP: (157-213)/(72-110) 157/72 (12/01 1500) SpO2:  [90 %-99 %] 91 % (12/01 1500) Weight:  [192 lb 7.4 oz (87.3 kg)-192 lb 14.4 oz (87.5 kg)] 192 lb 14.4 oz (87.5 kg) (12/01 0400)   Patient is alert and in no acute distress. Conjunctivae is pink..  Sclerae nonicteric. Oropharyngeal mucosa is normal. No neck masses or thyromegaly noted. Cardiac exam with regular rhythm normal S1 and S2.  No murmur noted. Lungs are clear to auscultation. Abdomen is full with bulging flanks.  Bandage noted over puncture site from yesterday.  Bowel sounds are normal.  Shifting is present.  Abdomen is not tense.  She has mild tenderness across lower half of the abdomen without guarding or rebound.  No organomegaly or masses. There is edema around ankles.    Lab Results: Recent Labs    05/03/17 1452  WBC 16.4*  HGB 12.9  HCT 40.0  PLT 390   BMET Recent Labs    05/03/17 1452  NA 142  K 3.6  CL 109  CO2 25  GLUCOSE 115*  BUN 9  CREATININE 0.55  CALCIUM 8.2*   LFT Recent Labs    05/03/17 1452  PROT 6.2*  ALBUMIN 2.5*  AST 32  ALT 11*  ALKPHOS 604*  BILITOT 1.0   PT/INR Recent Labs    05/03/17 1800  LABPROT 15.0  INR 1.19    Studies/Results: EXAM: CT ABDOMEN AND PELVIS WITH CONTRAST  TECHNIQUE: Multidetector CT imaging of the abdomen and pelvis was performed using the  standard protocol following bolus administration of intravenous contrast.  CONTRAST:  125mL ISOVUE-300 IOPAMIDOL (ISOVUE-300) INJECTION 61%  COMPARISON:  Abdominopelvic CT 12/17/2016.  FINDINGS: Lower chest: There is a new small dependent left pleural effusion with associated mild left lower lobe atelectasis. No significant right pleural or pericardial effusion. There are stable small paracardiac nodes.  Hepatobiliary: Re- demonstrated are changes of advanced hepatic cirrhosis. No focal hepatic lesions are identified. No evidence of gallstones, gallbladder wall thickening or biliary dilatation.  Pancreas: Atrophied without focal abnormality, ductal dilatation or surrounding inflammation.  Spleen: Normal in size without focal abnormality.  Adrenals/Urinary Tract: Both adrenal glands appear normal. The kidneys appear normal without evidence of urinary tract calculus, suspicious lesion or hydronephrosis. No bladder abnormalities are seen.  Stomach/Bowel: Stable mild duodenal and colonic wall thickening, attributed to ascites and liver disease. No bowel distention or surrounding focal inflammation.  Vascular/Lymphatic: Stable small lymph nodes in the porta hepatis. There is no retroperitoneal adenopathy. There is mild aortic and branch vessel atherosclerosis. The portal, superior mesenteric and splenic veins are patent. The left paraumbilical vein is recanalized.  Reproductive: Hysterectomy.  No evidence of adnexal mass.  Other: There is  a large amount of ascites which has mildly increased in volume compared with the prior study. No focal extraluminal fluid collections or peritoneal implants are seen. There is edema throughout the mesenteric fat. The anterior abdominal wall appears intact.  Musculoskeletal: No acute or significant osseous findings.  IMPRESSION: 1. No definite suggested acute abdominopelvic findings. 2. Morphologic changes of cirrhosis with  recanalization of the left paraumbilical vein and progressive ascites. Consider paracentesis for further evaluation. 3. New small left pleural effusion, possibly due to communication with the peritoneal cavity. 4. Similar duodenal and colonic wall thickening, likely related to chronic liver disease and ascites. 5.  Aortic Atherosclerosis (ICD10-I70.0  I have reviewed CT films.  Assessment;  Patient is  56 year old Caucasian female with chronic abdominal pain, depression and anxiety who presents with 5-day history of nausea vomiting and diarrhea.  On workup she was noted to have ascites and liver contour consistent with cirrhosis.  She underwent ultrasound-guided abdominal paracentesis with removal of 0.18 L of fluid.  Fluid analysis revealed WBC of 309 but neutrophil count only 7% and cultures so far negative.  Patient's GI problems can be summarized as below.  #1. Acute syndrome with nausea vomiting and diarrhea most likely self-limiting illness.  Stool testing for C. difficile is negative and GI pathogen panel result is pending.  Meanwhile she has responding to symptomatic therapy including metoclopramide.  Need for further testing will depend on her clinical course.  She is feeling better and therefore will begin full liquid diet.  #2. Chronic abdominal pain.  Patient has had abdominal pain for close to 40 years.  She has undergone extensive workup in the past including endoscopic ultrasound in September 2008.  She also underwent EGD in March 2007 and again in February 2011.  Similarly she has undergone 2 colonoscopies and these were negative for colitis.  Duodenal biopsy back in February 2011 was negative for celiac disease.  No further workup at this time since her pain has not changed.  #3. New onset of cirrhosis with ascites.  She has had mildly elevated serum transaminases and alkaline phosphatase for at least 3 years.  CT in July 2018 revealed fatty fatty liver and contour changes to  liver to suggest cirrhosis.  Suspect she has cirrhosis secondary to fatty liver but need to rule out primary biliary cholangitis and cirrhosis since serum alkaline phosphatase is elevated.  It is interesting to know that her alkaline phosphatase has been elevated for at least 3 years and has been gradually keeping up. Hepatitis B surface antigen and HCV antibody are pending. We will also screen for other conditions including celiac disease.    Recommendations;  Full liquid diet. Wait for results of GI pathogen panel. Will do the following studies; sed rate,  ANA, SMA, AMA, ceruloplasmin, alpha-1 antitrypsin, serum IgG IgA and IgM, serum iron TIBC, serum ferritin and alpha-fetoprotein. Will also screen for hepatitis B immunity and if this is not the case she will need to be revaccinated. Patient can be started on furosemide and Spironolactone at the time of discharge. She will also need to be screened for esophageal varices at some point. EGD can be performed on an outpatient basis.    LOS: 1 day   Najeeb Rehman  05/04/2017, 4:27 PM

## 2017-05-04 NOTE — Progress Notes (Signed)
Paged Dr. Laural Golden called back for consult.

## 2017-05-05 ENCOUNTER — Inpatient Hospital Stay (HOSPITAL_COMMUNITY): Payer: Medicare HMO

## 2017-05-05 DIAGNOSIS — R109 Unspecified abdominal pain: Secondary | ICD-10-CM

## 2017-05-05 LAB — CBC
HEMATOCRIT: 35.7 % — AB (ref 36.0–46.0)
HEMATOCRIT: 34.2 % — AB (ref 36.0–46.0)
HEMOGLOBIN: 10.9 g/dL — AB (ref 12.0–15.0)
Hemoglobin: 11.6 g/dL — ABNORMAL LOW (ref 12.0–15.0)
MCH: 29.7 pg (ref 26.0–34.0)
MCH: 29.7 pg (ref 26.0–34.0)
MCHC: 32.5 g/dL (ref 30.0–36.0)
MCHC: 31.9 g/dL (ref 30.0–36.0)
MCV: 91.5 fL (ref 78.0–100.0)
MCV: 93.2 fL (ref 78.0–100.0)
Platelets: 425 10*3/uL — ABNORMAL HIGH (ref 150–400)
Platelets: 390 10*3/uL (ref 150–400)
RBC: 3.9 MIL/uL (ref 3.87–5.11)
RBC: 3.67 MIL/uL — ABNORMAL LOW (ref 3.87–5.11)
RDW: 15.4 % (ref 11.5–15.5)
RDW: 15.4 % (ref 11.5–15.5)
WBC: 22.2 10*3/uL — AB (ref 4.0–10.5)
WBC: 18.3 10*3/uL — AB (ref 4.0–10.5)

## 2017-05-05 LAB — IRON AND TIBC
Iron: 36 ug/dL (ref 28–170)
SATURATION RATIOS: 22 % (ref 10.4–31.8)
TIBC: 162 ug/dL — AB (ref 250–450)
UIBC: 126 ug/dL

## 2017-05-05 LAB — BASIC METABOLIC PANEL
ANION GAP: 6 (ref 5–15)
BUN: 19 mg/dL (ref 6–20)
CALCIUM: 7.2 mg/dL — AB (ref 8.9–10.3)
CO2: 23 mmol/L (ref 22–32)
Chloride: 107 mmol/L (ref 101–111)
Creatinine, Ser: 0.8 mg/dL (ref 0.44–1.00)
GFR calc Af Amer: >60 mL/min (ref >60)
GFR calc non Af Amer: >60 mL/min (ref >60)
GLUCOSE: 122 mg/dL — AB (ref 65–99)
Potassium: 3.3 mmol/L — ABNORMAL LOW (ref 3.5–5.1)
Sodium: 136 mmol/L (ref 135–145)

## 2017-05-05 LAB — TROPONIN I
TROPONIN I: 0.36 ng/mL — AB (ref <0.03)
TROPONIN I: 0.19 ng/mL — AB (ref <0.03)

## 2017-05-05 LAB — ECHOCARDIOGRAM COMPLETE
Height: 66 in
Weight: 3086.44 oz

## 2017-05-05 LAB — FERRITIN: Ferritin: 163 ng/mL (ref 11–307)

## 2017-05-05 LAB — SEDIMENTATION RATE: Sed Rate: 48 mm/hr — ABNORMAL HIGH (ref 0–22)

## 2017-05-05 MED ORDER — METOPROLOL TARTRATE 25 MG PO TABS: 25.0000 mg | ORAL_TABLET | Freq: Two times a day (BID) | ORAL | 0 refills | Status: DC

## 2017-05-05 MED ORDER — MAGNESIUM SULFATE 2 GM/50ML IV SOLN
2.0000 g | Freq: Once | INTRAVENOUS | Status: AC
  Administered 2017-05-05: 2 g via INTRAVENOUS
  Filled 2017-05-05: qty 50

## 2017-05-05 MED ORDER — SPIRONOLACTONE 100 MG PO TABS: 100.0000 mg | ORAL_TABLET | Freq: Every day | ORAL | 11 refills | Status: DC

## 2017-05-05 MED ORDER — POTASSIUM CHLORIDE 10 MEQ/100ML IV SOLN
10.0000 meq | INTRAVENOUS | Status: AC
  Administered 2017-05-05 (×3): 10 meq via INTRAVENOUS
  Filled 2017-05-05 (×3): qty 100

## 2017-05-05 MED ORDER — POTASSIUM CHLORIDE CRYS ER 20 MEQ PO TBCR
30.0000 meq | EXTENDED_RELEASE_TABLET | Freq: Once | ORAL | Status: AC
  Administered 2017-05-05: 30 meq via ORAL
  Filled 2017-05-05: qty 1

## 2017-05-05 NOTE — Progress Notes (Signed)
AM labs drawn at this time. Potassium IV still running, not complete. Rate had to be decreased to 31ml/h for patient tolerance. Second K+ IV bag almost complete, one more to run. Will pass on to next shift.

## 2017-05-05 NOTE — Progress Notes (Signed)
IV removed, 2x2 gauze and paper tape applied to site, patient tolerated well. Reviewed discharge information with patient who verbalized understanding, Patient awaiting daughter to arrive to transport home.

## 2017-05-05 NOTE — Progress Notes (Signed)
CRITICAL VALUE ALERT  Critical Value:  Troponin 0.36  Date & Time Notied:  05/05/17 0503   Provider Notified: Myna Hidalgo, MD  Orders Received/Actions taken: Dr. Text paged for notification of troponin level and K+ IV not complete d/t IV rate 52ml/h for patient tolerance. Patient still denies chest pain, no distress noted.

## 2017-05-05 NOTE — Progress Notes (Signed)
  Subjective:  Patient feels much better.  She says she has no abdominal pain this afternoon.  Her appetite is improved.  She did not experience nausea or vomiting.  She had 2 bowel movements today.  Stool volume is small and consistency is better.  Objective: Blood pressure 130/60, pulse 73, temperature 98.2 F (36.8 C), temperature source Oral, resp. rate 18, height 5\' 6"  (1.676 m), weight 192 lb 14.4 oz (87.5 kg), SpO2 90 %. Patient is alert and in no acute distress. Abdomen is full with bulging and dull flanks.  Abdomen is soft and nontender without organomegaly or masses. No LE edema or clubbing noted.  Labs/studies Results:  Recent Labs    May 15, 2017 2021 05/05/17 0330 05/05/17 1053  WBC 23.4* 22.2* 18.3*  HGB 12.3 11.6* 10.9*  HCT 37.9 35.7* 34.2*  PLT 425* 425* 390    BMET  Recent Labs    05/03/17 1452 05-15-17 2021 05/05/17 0330  NA 142 134* 136  K 3.6 2.8* 3.3*  CL 109 105 107  CO2 25 24 23   GLUCOSE 115* 87 122*  BUN 9 20 19   CREATININE 0.55 0.67 0.80  CALCIUM 8.2* 7.1* 7.2*    LFT  Recent Labs    05/03/17 1452 May 15, 2017 2021  PROT 6.2* 5.5*  ALBUMIN 2.5* 2.2*  AST 32 44*  ALT 11* 14  ALKPHOS 604* 542*  BILITOT 1.0 0.7    PT/INR  Recent Labs    05/03/17 1800  LABPROT 15.0  INR 1.19    Ascitic fluid cultures remain negative at 48 hours. Sed rate is 48.  Multiple other results pending.  Assessment:  #1.  Nausea vomiting and diarrhea.  Nausea vomiting has resolved.  Stool frequency has decreased.  She is tolerating diet.  GI pathogen panel is still pending.  #2.  Leukocytosis.  It may be related to acute illness.  Ascitic fluid cultures remain negative.  Cell count is coming down.  She is afebrile.   #3.  Cirrhosis with ascites.  New diagnosis.  Workup underway.  Most likely diagnoses include fatty liver and PBC.  Also checking for other diagnoses.  Serum albumin is quite low.  Hopefully it would reverse as acute condition resolves and oral  intake improves.  #4.  Chronic abdominal pain.  Pain control appears to be satisfactory with pain medication.   Recommendations:  Patient stable for discharge from GI standpoint. Patient can continue furosemide at 40 mg po daily. Add spironolactone at a dose of 100 mg po. daily. Will arrange for a visit with Dr. Gala Romney and associates within 1-2 weeks.

## 2017-05-05 NOTE — Discharge Summary (Addendum)
Physician Discharge Summary  [name]:[id number] DOB: 1960/12/04 DOA: 05/03/2017  PCP: Redmond School, MD  Admit date: 05/03/2017 Discharge date: 05/05/2017  Admitted From: Home Disposition: Home  Recommendations for Outpatient Follow-up:  1. Follow up with PCP in 1-2 weeks 2. Please obtain BMP/CBC in one week 3. Follow-up with GI in 1-2 weeks  Home Health: Equipment/Devices:  Discharge Condition: Stable CODE STATUS: Full code Diet recommendation: Heart Healthy  Brief/Interim Summary: 56 year old female presents to the hospital with nausea, vomiting and diarrhea.  She had abdominal pain and distention.  Found to have evidence of cirrhosis and ascites on image underwent paracentesis with removal of 4.1.  She was also noted to be markedly hypertensive.  She was admitted for supportive management of GI symptoms, GI consultation for liver issues, as well as management of blood pressure.  Discharge Diagnoses:  Active Problems:   Abdominal pain   Nausea and vomiting   Hypertensive urgency   Elevated troponin   Other cirrhosis of liver (HCC)   Ascites   Chronic pain   GERD (gastroesophageal reflux disease)   Hypothyroidism  1. Nausea, vomiting and diarrhea.  Possibly viral gastroenteritis.  Stool for C. difficile is negative.  GI pathogen panel pending.  Treat supportively.  Overall diarrhea is better.  Treat vomiting with antiemetics.    Diet was advanced and she tolerated this well. 2. Cirrhosis with ascites.  Hepatitis panel has been ordered.  She denies any history of alcohol use.  She underwent paracentesis with removal of 4 L of fluid.  GI was consulted and ordered a battery of tests to further evaluate underlying cause of cirrhosis.  She will follow-up with them as an outpatient.  She will be continued on her home dose of Lasix.  Per GI, she has been started on Aldactone as well. 3. Hypothyroidism.  Continue on Synthroid 4. GERD.  Continue PPI. 5. Hypertensive  urgency.  Blood pressure was elevated over 200 on admission.    Blood pressure medications were adjusted and now blood pressure is improved. 6. Elevated troponin.  No evidence of typical chest pain.  Suspect this is related to severe hypertension.  Echocardiogram did not show any wall motion abnormalities.  EF was in normal range. 7. Chronic pain.  Patient reports chronic back pain for which she takes oxycodone and baclofen.  Continue home dose of medications. 8. Leukocytosis.  Appears to be a chronic issue.  She does not appear to be septic or toxic and appears to be doing quite well.  Clinically she does not have any evidence of infection.  She will need repeat CBC in 1 week to ensure resolution, leukocytosis is trending down.     Discharge Instructions  Discharge Instructions    Diet - low sodium heart healthy   Complete by:  As directed    Increase activity slowly   Complete by:  As directed      Allergies as of 05/05/2017      Reactions   Codeine Hives, Other (See Comments)   Hallucinations   Latex Hives, Itching   Lyrica [pregabalin] Hives   Nsaids    Other Hives, Itching   Oral vitamins      Medication List    STOP taking these medications   ciprofloxacin 500 MG tablet Commonly known as:  CIPRO     TAKE these medications   ALPRAZolam 1 MG tablet Commonly known as:  XANAX Take 1 mg by mouth 3 (three) times daily as needed for anxiety.  baclofen 10 MG tablet Commonly known as:  LIORESAL Take 10 mg by mouth at bedtime. May take up to 3 times daily for Muscle spasms   DULoxetine 60 MG capsule Commonly known as:  CYMBALTA Take 60 mg by mouth at bedtime.   estradiol 1 MG tablet Commonly known as:  ESTRACE Take 1 mg by mouth daily.   fentaNYL 25 MCG/HR patch Commonly known as:  [place] - dosed mcg/hr Place 1 patch onto the skin every 3 (three) days.   furosemide 20 MG tablet Commonly known as:  LASIX Take 20 mg by mouth daily.   levothyroxine 25 MCG  tablet Commonly known as:  SYNTHROID, LEVOTHROID Take 25 mcg by mouth daily.   metoCLOPramide 5 MG tablet Commonly known as:  REGLAN Take 5 mg by mouth 4 (four) times daily -  before meals and at bedtime.   metoprolol tartrate 25 MG tablet Commonly known as:  LOPRESSOR Take 1 tablet (25 mg total) by mouth 2 (two) times daily. What changed:  when to take this   mometasone-formoterol 100-5 MCG/ACT Aero Commonly known as:  DULERA Inhale 2 puffs into the lungs 2 (two) times daily.   omeprazole 20 MG capsule Commonly known as:  PRILOSEC Take 20 mg by mouth at bedtime.   oxycodone 30 MG immediate release tablet Commonly known as:  ROXICODONE Take 30 mg by mouth every 4 (four) hours as needed for pain.   PROAIR HFA 108 (90 Base) MCG/ACT inhaler Generic drug:  albuterol Inhale 2 puffs into the lungs daily as needed for wheezing or shortness of breath.   spironolactone 100 MG tablet Commonly known as:  ALDACTONE Take 1 tablet (100 mg total) by mouth daily.   venlafaxine 75 MG tablet Commonly known as:  EFFEXOR Take 75 mg by mouth daily.   zolpidem 10 MG tablet Commonly known as:  AMBIEN Take 10 mg by mouth at bedtime.       Allergies  Allergen Reactions  . Codeine Hives and Other (See Comments)    Hallucinations  . Latex Hives and Itching  . Lyrica [Pregabalin] Hives  . Nsaids   . Other Hives and Itching    Oral vitamins    Consultations:  Gastroenterology   Procedures/Studies: Ct Abdomen Pelvis W Contrast  Result Date: 05/03/2017 CLINICAL DATA:  Nausea, vomiting and diarrhea for 4 days. Diarrhea over the last day. History of chronic obstructive pulmonary disease and hypertension. EXAM: CT ABDOMEN AND PELVIS WITH CONTRAST TECHNIQUE: Multidetector CT imaging of the abdomen and pelvis was performed using the standard protocol following bolus administration of intravenous contrast. CONTRAST:  170mL ISOVUE-300 IOPAMIDOL (ISOVUE-300) INJECTION 61% COMPARISON:   Abdominopelvic CT 12/17/2016. FINDINGS: Lower chest: There is a new small dependent left pleural effusion with associated mild left lower lobe atelectasis. No significant right pleural or pericardial effusion. There are stable small paracardiac nodes. Hepatobiliary: Re- demonstrated are changes of advanced hepatic cirrhosis. No focal hepatic lesions are identified. No evidence of gallstones, gallbladder wall thickening or biliary dilatation. Pancreas: Atrophied without focal abnormality, ductal dilatation or surrounding inflammation. Spleen: Normal in size without focal abnormality. Adrenals/Urinary Tract: Both adrenal glands appear normal. The kidneys appear normal without evidence of urinary tract calculus, suspicious lesion or hydronephrosis. No bladder abnormalities are seen. Stomach/Bowel: Stable mild duodenal and colonic wall thickening, attributed to ascites and liver disease. No bowel distention or surrounding focal inflammation. Vascular/Lymphatic: Stable small lymph nodes in the porta hepatis. There is no retroperitoneal adenopathy. There is mild aortic and branch vessel  atherosclerosis. The portal, superior mesenteric and splenic veins are patent. The left paraumbilical vein is recanalized. Reproductive: Hysterectomy.  No evidence of adnexal mass. Other: There is a large amount of ascites which has mildly increased in volume compared with the prior study. No focal extraluminal fluid collections or peritoneal implants are seen. There is edema throughout the mesenteric fat. The anterior abdominal wall appears intact. Musculoskeletal: No acute or significant osseous findings. IMPRESSION: 1. No definite suggested acute abdominopelvic findings. 2. Morphologic changes of cirrhosis with recanalization of the left paraumbilical vein and progressive ascites. Consider paracentesis for further evaluation. 3. New small left pleural effusion, possibly due to communication with the peritoneal cavity. 4. Similar  duodenal and colonic wall thickening, likely related to chronic liver disease and ascites. 5.  Aortic Atherosclerosis (ICD10-I70.0). Electronically Signed   By: Richardean Sale M.D.   On: 05/03/2017 16:02   US Paracentesis  Result Date: 05/03/2017 INDICATION: Ascites EXAM: ULTRASOUND GUIDED DIAGNOSTIC AND THERAPEUTIC PARACENTESIS MEDICATIONS: None. COMPLICATIONS: None immediate. PROCEDURE: Procedure, benefits, and risks of procedure were discussed with patient. Written informed consent for procedure was obtained. Time out protocol followed. Adequate collection of ascites localized by ultrasound in RIGHT lower quadrant. Skin prepped and draped in usual sterile fashion. Skin and soft tissues anesthetized with 10 mL of 1% lidocaine. 5 [place] Yueh catheter placed into peritoneal cavity. 4.18 L of amber colored ascitic fluid aspirated by vacuum bottle suction. Procedure tolerated well by patient without immediate complication. FINDINGS: A total of approximately 4.18 L of ascitic fluid was removed. Samples were sent to the laboratory as requested by the clinical team. IMPRESSION: Successful ultrasound-guided paracentesis yielding 4.18 liters of peritoneal fluid. Electronically Signed   By: Lavonia Dana M.D.   On: 05/03/2017 17:44    Ultrasound-guided paracentesis with removal of 4.1 L of fluid  Echocardiogram:- Left ventricle: The cavity size was normal. Systolic function was   normal. The estimated ejection fraction was in the range of 60%   to 65%. Wall motion was normal; there were no regional wall   motion abnormalities. - Aortic valve: There was trivial regurgitation.   Subjective: Feeling better today.  Nausea and vomiting have resolved.  No diarrhea.  Abdominal pain is better.  Discharge Exam: Vitals:   05/05/17 0510 05/05/17 0817  BP: 130/60   Pulse: 73   Resp: 18   Temp: 98.2 F (36.8 C)   SpO2: 95% 90%   Vitals:   05/04/17 2044 05/04/17 2300 05/05/17 0510 05/05/17 0817  BP:  138/64  130/60   Pulse:  89 73   Resp:  20 18   Temp:  98.5 F (36.9 C) 98.2 F (36.8 C)   TempSrc:  Oral Oral   SpO2: 94% 95% 95% 90%  Weight:      Height:        General: Pt is alert, awake, not in acute distress Cardiovascular: RRR, S1/S2 +, no rubs, no gallops Respiratory: CTA bilaterally, no wheezing, no rhonchi Abdominal: Soft, NT, ND, bowel sounds + Extremities: no edema, no cyanosis    The results of significant diagnostics from this hospitalization (including imaging, microbiology, ancillary and laboratory) are listed below for reference.     Microbiology: Recent Results (from the past 240 hour(s))  Gram stain     Status: None   Collection Time: 05/03/17  4:07 PM  Result Value Ref Range Status   Specimen Description PERITONEAL CAVITY  Final   Special Requests NONE  Final   Gram Stain   Final  CYTOSPIN SMEAR NO ORGANISMS SEEN WBC SEEN WBC PRESENT,BOTH PMN AND MONONUCLEAR    Report Status 05/03/2017 FINAL  Final  Culture, body fluid-bottle     Status: None (Preliminary result)   Collection Time: 05/03/17  4:30 PM  Result Value Ref Range Status   Specimen Description PERITONEAL CAVITY  Final   Special Requests PERITONEAL CAVITY  Final   Culture NO GROWTH 2 DAYS  Final   Report Status PENDING  Incomplete  MRSA PCR Screening     Status: None   Collection Time: 05/04/17 12:39 AM  Result Value Ref Range Status   MRSA by PCR NEGATIVE NEGATIVE Final    Comment:        The GeneXpert MRSA Assay (FDA approved for NASAL specimens only), is one component of a comprehensive MRSA colonization surveillance program. It is not intended to diagnose MRSA infection nor to guide or monitor treatment for MRSA infections.   C difficile quick scan w PCR reflex     Status: None   Collection Time: 05/04/17  8:30 AM  Result Value Ref Range Status   C Diff antigen NEGATIVE NEGATIVE Final   C Diff toxin NEGATIVE NEGATIVE Final   C Diff interpretation No C. difficile detected.   Final     Labs: BNP (last 3 results) No results for input(s): BNP in the last 8760 hours. Basic Metabolic Panel: Recent Labs  Lab 05/03/17 1452 05/04/17 2021 05/05/17 0330  NA 142 134* 136  K 3.6 2.8* 3.3*  CL 109 105 107  CO2 25 24 23   GLUCOSE 115* 87 122*  BUN 9 20 19   CREATININE 0.55 0.67 0.80  CALCIUM 8.2* 7.1* 7.2*  MG 1.6*  --   --   PHOS 4.2  --   --    Liver Function Tests: Recent Labs  Lab 05/03/17 1452 05/04/17 2021  AST 32 44*  ALT 11* 14  ALKPHOS 604* 542*  BILITOT 1.0 0.7  PROT 6.2* 5.5*  ALBUMIN 2.5* 2.2*   Recent Labs  Lab 05/03/17 1452  LIPASE 17   No results for input(s): AMMONIA in the last 168 hours. CBC: Recent Labs  Lab 05/03/17 1452 05/04/17 2021 05/05/17 0330 05/05/17 1053  WBC 16.4* 23.4* 22.2* 18.3*  NEUTROABS 13.8*  --   --   --   HGB 12.9 12.3 11.6* 10.9*  HCT 40.0 37.9 35.7* 34.2*  MCV 90.7 90.5 91.5 93.2  PLT 390 425* 425* 390   Cardiac Enzymes: Recent Labs  Lab 05/03/17 2054 05/03/17 2256 05/04/17 2021 05/05/17 0330 05/05/17 1029  TROPONINI 0.13* 0.19* 0.54* 0.36* 0.19*   BNP: Invalid input(s): POCBNP CBG: No results for input(s): GLUCAP in the last 168 hours. D-Dimer No results for input(s): DDIMER in the last 72 hours. Hgb A1c No results for input(s): HGBA1C in the last 72 hours. Lipid Profile Recent Labs    05/04/17 2020  CHOL 260*  HDL 34*  LDLCALC 197*  TRIG 147  CHOLHDL 7.6   Thyroid function studies Recent Labs    05/04/17 2020  TSH 1.842   Anemia work up Recent Labs    05/05/17 0330  FERRITIN 163  TIBC 162*  IRON 36   Urinalysis    Component Value Date/Time   COLORURINE AMBER (A) 12/17/2016 2131   APPEARANCEUR HAZY (A) 12/17/2016 2131   LABSPEC 1.030 12/17/2016 2131   PHURINE 6.0 12/17/2016 2131   [place] NEGATIVE 12/17/2016 2131   HGBUR NEGATIVE 12/17/2016 2131   BILIRUBINUR SMALL (A) 12/17/2016 2131  [place] NEGATIVE 12/17/2016 2131   PROTEINUR >=300 (A) 12/17/2016  2131   UROBILINOGEN 0.2 04/04/2009 2232   NITRITE NEGATIVE 12/17/2016 2131   LEUKOCYTESUR NEGATIVE 12/17/2016 2131   Sepsis Labs Invalid input(s): PROCALCITONIN,  WBC,  LACTICIDVEN Microbiology Recent Results (from the past 240 hour(s))  Gram stain     Status: None   Collection Time: 05/03/17  4:07 PM  Result Value Ref Range Status   Specimen Description PERITONEAL CAVITY  Final   Special Requests NONE  Final   Gram Stain   Final    CYTOSPIN SMEAR NO ORGANISMS SEEN WBC SEEN WBC PRESENT,BOTH PMN AND MONONUCLEAR    Report Status 05/03/2017 FINAL  Final  Culture, body fluid-bottle     Status: None (Preliminary result)   Collection Time: 05/03/17  4:30 PM  Result Value Ref Range Status   Specimen Description PERITONEAL CAVITY  Final   Special Requests PERITONEAL CAVITY  Final   Culture NO GROWTH 2 DAYS  Final   Report Status PENDING  Incomplete  MRSA PCR Screening     Status: None   Collection Time: 05/04/17 12:39 AM  Result Value Ref Range Status   MRSA by PCR NEGATIVE NEGATIVE Final    Comment:        The GeneXpert MRSA Assay (FDA approved for NASAL specimens only), is one component of a comprehensive MRSA colonization surveillance program. It is not intended to diagnose MRSA infection nor to guide or monitor treatment for MRSA infections.   C difficile quick scan w PCR reflex     Status: None   Collection Time: 05/04/17  8:30 AM  Result Value Ref Range Status   C Diff antigen NEGATIVE NEGATIVE Final   C Diff toxin NEGATIVE NEGATIVE Final   C Diff interpretation No C. difficile detected.  Final     Time coordinating discharge: Over 30 minutes  SIGNED:   Kathie Dike, MD  [facility] 05/05/2017, 3:27 PM Pager   If 7PM-7AM, please contact night-coverage [email or web address] Password [email or web address]

## 2017-05-05 NOTE — Progress Notes (Signed)
CRITICAL VALUE ALERT  Critical Value:  Troponin 0.54  Date & Time Notied:  05/04/17 2200, 2226, 2330  Provider Notified: M. Holli Humbles, MD  Orders Received/Actions taken: This RN notified M. Blount x2 with no response, then notified Opyd, MD with new orders.

## 2017-05-05 NOTE — Progress Notes (Signed)
  Echocardiogram 2D Echocardiogram has been performed.  [name] T Deanette Tullius 05/05/2017, 9:37 AM

## 2017-05-06 LAB — GASTROINTESTINAL PANEL BY PCR, STOOL (REPLACES STOOL CULTURE)

## 2017-05-06 LAB — HEPATITIS PANEL, ACUTE
HCV Ab: <0.1 s/co ratio (ref 0.0–0.9)
Hep A IgM: NEGATIVE
Hep B C IgM: NEGATIVE
Hepatitis B Surface Ag: NEGATIVE

## 2017-05-06 LAB — PATHOLOGIST SMEAR REVIEW

## 2017-05-06 LAB — AFP TUMOR MARKER: AFP, Serum, Tumor Marker: 4.3 ng/mL (ref 0.0–8.3)

## 2017-05-06 LAB — HEPATITIS B SURFACE ANTIBODY,QUALITATIVE: Hep B S Ab: REACTIVE

## 2017-05-06 LAB — HIV ANTIBODY (ROUTINE TESTING W REFLEX): HIV Screen 4th Generation wRfx: NONREACTIVE

## 2017-05-07 ENCOUNTER — Ambulatory Visit: Payer: Medicare HMO | Admitting: Cardiology

## 2017-05-07 LAB — TISSUE TRANSGLUTAMINASE, IGA: Tissue Transglutaminase Ab, IgA: <2 U/mL (ref 0–3)

## 2017-05-07 LAB — GLIADIN ANTIBODIES, SERUM
ANTIGLIADIN ABS, IGA: 10 U (ref 0–19)
GLIADIN IGG: 3 U (ref 0–19)

## 2017-05-07 LAB — MITOCHONDRIAL ANTIBODIES: MITOCHONDRIAL M2 AB, IGG: 4.3 U (ref 0.0–20.0)

## 2017-05-07 LAB — ANTINUCLEAR ANTIBODIES, IFA: ANTINUCLEAR ANTIBODIES, IFA: NEGATIVE

## 2017-05-07 LAB — ANTI-SMOOTH MUSCLE ANTIBODY, IGG: F-Actin IgG: 25 Units — ABNORMAL HIGH (ref 0–19)

## 2017-05-08 LAB — CULTURE, BODY FLUID W GRAM STAIN -BOTTLE: Culture: NO GROWTH

## 2017-05-10 LAB — CERULOPLASMIN: CERULOPLASMIN: 28.1 mg/dL (ref 19.0–39.0)

## 2017-05-10 LAB — IGG, IGA, IGM
IGA: 362 mg/dL — AB (ref 87–352)
IgG (Immunoglobin G), Serum: 970 mg/dL (ref 700–1600)
IgM (Immunoglobulin M), Srm: 124 mg/dL (ref 26–217)

## 2017-05-10 LAB — ALPHA-1-ANTITRYPSIN: A-1 Antitrypsin, Ser: 157 mg/dL (ref 90–200)

## 2017-05-13 NOTE — Progress Notes (Signed)
Anti smooth muscle ab weakly positive and sed rate elevated.  Immune to hep B.  Patient seen in hospital by Dr. Laural Golden for ascites and new diagnosis of cirrhosis.   Patient needs follow up in next 1-2 weeks, may use urgent.  To discuss labs with Dr. Gala Romney.

## 2017-05-16 NOTE — Progress Notes (Signed)
PATIENT COMING IN 05/21/17

## 2017-05-20 DIAGNOSIS — E6609 Other obesity due to excess calories: Secondary | ICD-10-CM | POA: Diagnosis not present

## 2017-05-20 DIAGNOSIS — Z6835 Body mass index (BMI) 35.0-35.9, adult: Secondary | ICD-10-CM | POA: Diagnosis not present

## 2017-05-20 DIAGNOSIS — B002 Herpesviral gingivostomatitis and pharyngotonsillitis: Secondary | ICD-10-CM | POA: Diagnosis not present

## 2017-05-20 DIAGNOSIS — J22 Unspecified acute lower respiratory infection: Secondary | ICD-10-CM | POA: Diagnosis not present

## 2017-05-21 ENCOUNTER — Encounter: Payer: Self-pay | Admitting: Gastroenterology

## 2017-05-21 ENCOUNTER — Ambulatory Visit: Payer: Medicare HMO | Admitting: Gastroenterology

## 2017-05-21 VITALS — BP 103/59 | HR 82 | Temp 96.9°F | Ht 66.0 in | Wt 197.2 lb

## 2017-05-21 DIAGNOSIS — K7460 Unspecified cirrhosis of liver: Secondary | ICD-10-CM

## 2017-05-21 DIAGNOSIS — K7290 Hepatic failure, unspecified without coma: Secondary | ICD-10-CM

## 2017-05-21 NOTE — Progress Notes (Signed)
Primary Care Physician: Redmond School, MD  Primary Gastroenterologist:  Garfield Cornea, MD   Chief Complaint  Patient presents with  . Ascites    admitted 05/03/17; LE edema    HPI: [name] is a 56 y.o. female here for hospital follow up. She has h/o chronic abdominal pain, abnormal LFTs.  We last saw her in the office back in June for abdominal pain.  Ultrasound obtained showed nodular contour of the liver suggesting cirrhosis.  I requested multiple serologies to further evaluate her liver disease but she failed to have the blood work done.  She presented to the hospital several weeks ago with nausea, vomiting, diarrhea and abdominal pain.  She was noted to have new onset ascites.  She underwent abdominal paracenteses with removal of over 4 L of fluid.  Analysis was negative for spontaneous bacterial peritonitis.  During recent hospitalization in November her total bilirubin was normal at 1.  Alkaline phosphatase 604, 5 months previously was 329, AST 32, ALT 11, albumin 2.5, HIV nonreactive, TSH 1.842, acute hepatitis panel negative, ANA negative, AMA normal, anti-smooth muscle antibody weakly positive, sed rate elevated.  She is immune to hepatitis B.  Ceruloplasmin normal, iron 36, TIBC 162, iron saturation 22%, alpha-1 antitrypsin normal, immunoglobulins essentially normal, AFP tumor marker 4.3, celiac markers negative.  White blood cell count 18,300, hemoglobin 10.9, platelets normal at 390,000.  CT abdomen pelvis with contrast on November 30 showed advanced cirrhosis, atrophy pancreas, stable mild duodenal and colonic wall thickening attributed to ascites and liver disease.  Large amount of ascites which is mildly increased in volume compared to previous study.  Since discharge she has been having some issues.  Diagnosed with bronchitis and fever blisters recently.  Given "steroid" shot. Supposed to start [place] soon.  Overall diarrhea improved, stools are less runny but  not normal.  Most days 2-3 per stools per day.  Chronic abdominal pain stable.  She denies blood in the stool or melena.  No complaints of heartburn.  Denies any previous or current history of alcohol use.    Referring to heart doctor for abnormal ECHO, 05/30/17.   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  No H. pylori.  Rectal and colonic polyps removed, colonic diverticulosis.  Next colonoscopy planned for 5 years.  Current Outpatient Medications  Medication Sig Dispense Refill  . baclofen (LIORESAL) 10 MG tablet Take 10 mg by mouth at bedtime. May take up to 3 times daily for Muscle spasms    . DULoxetine (CYMBALTA) 60 MG capsule Take 60 mg by mouth at bedtime.     Marland Kitchen estradiol (ESTRACE) 1 MG tablet Take 1 mg by mouth daily.    . fentaNYL (DURAGESIC - DOSED MCG/HR) 25 MCG/HR patch Place 1 patch onto the skin every 3 (three) days.    . furosemide (LASIX) 20 MG tablet Take 20 mg by mouth daily.    Marland Kitchen levothyroxine (SYNTHROID, LEVOTHROID) 25 MCG tablet Take 25 mcg by mouth daily.    . metoCLOPramide (REGLAN) 5 MG tablet Take 5 mg by mouth 4 (four) times daily -  before meals and at bedtime.     . metoprolol tartrate (LOPRESSOR) 25 MG tablet Take 1 tablet (25 mg total) by mouth 2 (two) times daily. 60 tablet 0  . mometasone-formoterol (DULERA) 100-5 MCG/ACT AERO Inhale 2 puffs into the lungs 2 (  two) times daily.    Marland Kitchen omeprazole (PRILOSEC) 20 MG capsule Take 20 mg by mouth at bedtime.     Marland Kitchen oxycodone (ROXICODONE) 30 MG immediate release tablet Take 30 mg by mouth every 4 (four) hours as needed for pain.     Marland Kitchen PROAIR HFA 108 (90 BASE) MCG/ACT inhaler Inhale 2 puffs into the lungs daily as needed for wheezing or shortness of breath.     . spironolactone (ALDACTONE) 100 MG tablet Take 1 tablet (100 mg total) by mouth daily. 30 tablet 11  . venlafaxine (EFFEXOR) 75 MG tablet  Take 75 mg by mouth daily.    Marland Kitchen zolpidem (AMBIEN) 10 MG tablet Take 10 mg by mouth at bedtime.      . ALPRAZolam (XANAX) 1 MG tablet Take 1 mg by mouth 3 (three) times daily as needed for anxiety.     No current facility-administered medications for this visit.     Allergies as of 05/21/2017 - Review Complete 05/21/2017  Allergen Reaction Noted  . Codeine Hives and Other (See Comments) 03/03/2008  . Latex Hives and Itching 02/23/2011  . Lyrica [pregabalin] Hives 11/20/2016  . Nsaids  11/20/2016  . Other Hives and Itching 02/23/2013    ROS:  General: Negative for anorexia, weight loss, fever, chills,+ rocker fatigue, +weakness. ENT: Negative for hoarseness, difficulty swallowing , nasal congestion. CV: Negative for chest pain, angina, palpitations, dyspnea on exertion, peripheral edema.  Respiratory: Negative for dyspnea at rest, dyspnea on exertion, cough, sputum, wheezing.  GI: See history of present illness. GU:  Negative for dysuria, hematuria, urinary incontinence, urinary frequency, nocturnal urination.  Endo: Negative for unusual weight change.    Physical Examination:   BP (!) 103/59   Pulse 82   Temp (!) 96.9 F (36.1 C) (Oral)   Ht 5\' 6"  (1.676 m)   Wt 197 lb 3.2 oz (89.4 kg)   BMI 31.83 kg/m   General: chronically ill appearing WF, NAD.  Eyes: No icterus. Mouth: Oropharyngeal mucosa moist and pink , blisters noted on lips.  Lungs: Clear to auscultation bilaterally.  Heart: Regular rate and rhythm, no murmurs rubs or gallops.  Abdomen: Bowel sounds are normal, nontender, distended but not tense,  no abdominal bruits or hernia , no rebound or guarding.   Extremities: No lower extremity edema. No clubbing or deformities. Neuro: Alert and oriented x 4   Skin: Warm and dry, no jaundice.   Psych: Alert and cooperative, normal mood and affect.  Labs:  Lab Results  Component Value Date   CREATININE 0.80 05/05/2017   BUN 19 05/05/2017   NA 136 05/05/2017   K 3.3  (L) 05/05/2017   CL 107 05/05/2017   CO2 23 05/05/2017   Lab Results  Component Value Date   WBC 18.3 (H) 05/05/2017   HGB 10.9 (L) 05/05/2017   HCT 34.2 (L) 05/05/2017   MCV 93.2 05/05/2017   PLT 390 05/05/2017   Lab Results  Component Value Date   ALT 14 05/04/2017   AST 44 (H) 05/04/2017   ALKPHOS 542 (H) 05/04/2017   BILITOT 0.7 05/04/2017    Imaging Studies: Ct Abdomen Pelvis W Contrast  Result Date: 05/03/2017 CLINICAL DATA:  Nausea, vomiting and diarrhea for 4 days. Diarrhea over the last day. History of chronic obstructive pulmonary disease and hypertension. EXAM: CT ABDOMEN AND PELVIS WITH CONTRAST TECHNIQUE: Multidetector CT imaging of the abdomen and pelvis was performed using the standard protocol following bolus administration of intravenous contrast. CONTRAST:  120mL  ISOVUE-300 IOPAMIDOL (ISOVUE-300) INJECTION 61% COMPARISON:  Abdominopelvic CT 12/17/2016. FINDINGS: Lower chest: There is a new small dependent left pleural effusion with associated mild left lower lobe atelectasis. No significant right pleural or pericardial effusion. There are stable small paracardiac nodes. Hepatobiliary: Re- demonstrated are changes of advanced hepatic cirrhosis. No focal hepatic lesions are identified. No evidence of gallstones, gallbladder wall thickening or biliary dilatation. Pancreas: Atrophied without focal abnormality, ductal dilatation or surrounding inflammation. Spleen: Normal in size without focal abnormality. Adrenals/Urinary Tract: Both adrenal glands appear normal. The kidneys appear normal without evidence of urinary tract calculus, suspicious lesion or hydronephrosis. No bladder abnormalities are seen. Stomach/Bowel: Stable mild duodenal and colonic wall thickening, attributed to ascites and liver disease. No bowel distention or surrounding focal inflammation. Vascular/Lymphatic: Stable small lymph nodes in the porta hepatis. There is no retroperitoneal adenopathy. There is mild  aortic and branch vessel atherosclerosis. The portal, superior mesenteric and splenic veins are patent. The left paraumbilical vein is recanalized. Reproductive: Hysterectomy.  No evidence of adnexal mass. Other: There is a large amount of ascites which has mildly increased in volume compared with the prior study. No focal extraluminal fluid collections or peritoneal implants are seen. There is edema throughout the mesenteric fat. The anterior abdominal wall appears intact. Musculoskeletal: No acute or significant osseous findings. IMPRESSION: 1. No definite suggested acute abdominopelvic findings. 2. Morphologic changes of cirrhosis with recanalization of the left paraumbilical vein and progressive ascites. Consider paracentesis for further evaluation. 3. New small left pleural effusion, possibly due to communication with the peritoneal cavity. 4. Similar duodenal and colonic wall thickening, likely related to chronic liver disease and ascites. 5.  Aortic Atherosclerosis (ICD10-I70.0). Electronically Signed   By: Richardean Sale M.D.   On: 05/03/2017 16:02   US Paracentesis  Result Date: 05/03/2017 INDICATION: Ascites EXAM: ULTRASOUND GUIDED DIAGNOSTIC AND THERAPEUTIC PARACENTESIS MEDICATIONS: None. COMPLICATIONS: None immediate. PROCEDURE: Procedure, benefits, and risks of procedure were discussed with patient. Written informed consent for procedure was obtained. Time out protocol followed. Adequate collection of ascites localized by ultrasound in RIGHT lower quadrant. Skin prepped and draped in usual sterile fashion. Skin and soft tissues anesthetized with 10 mL of 1% lidocaine. 5 [place] Yueh catheter placed into peritoneal cavity. 4.18 L of amber colored ascitic fluid aspirated by vacuum bottle suction. Procedure tolerated well by patient without immediate complication. FINDINGS: A total of approximately 4.18 L of ascitic fluid was removed. Samples were sent to the laboratory as requested by the clinical  team. IMPRESSION: Successful ultrasound-guided paracentesis yielding 4.18 liters of peritoneal fluid. Electronically Signed   By: Lavonia Dana M.D.   On: 05/03/2017 17:44

## 2017-05-21 NOTE — Patient Instructions (Signed)
1. Please have your labs the week of December 31st.  2. We will review your medication list from your pharmacy and make recommendations for fluid pills.  3. Limit your diet to no more than 2000 mg of sodium (salt) daily.    Low-Sodium Eating Plan Sodium, which is an element that makes up salt, helps you maintain a healthy balance of fluids in your body. Too much sodium can increase your blood pressure and cause fluid and waste to be held in your body. Your health care provider or dietitian may recommend following this plan if you have high blood pressure (hypertension), kidney disease, liver disease, or heart failure. Eating less sodium can help lower your blood pressure, reduce swelling, and protect your heart, liver, and kidneys. What are tips for following this plan? General guidelines  Most people on this plan should limit their sodium intake to 1,500-2,000 mg (milligrams) of sodium each day. Reading food labels  The Nutrition Facts label lists the amount of sodium in one serving of the food. If you eat more than one serving, you must multiply the listed amount of sodium by the number of servings.  Choose foods with less than 140 mg of sodium per serving.  Avoid foods with 300 mg of sodium or more per serving. Shopping  Look for lower-sodium products, often labeled as "low-sodium" or "no salt added."  Always check the sodium content even if foods are labeled as "unsalted" or "no salt added".  Buy fresh foods. ? Avoid canned foods and premade or frozen meals. ? Avoid canned, cured, or processed meats  Buy breads that have less than 80 mg of sodium per slice. Cooking  Eat more home-cooked food and less restaurant, buffet, and fast food.  Avoid adding salt when cooking. Use salt-free seasonings or herbs instead of table salt or sea salt. Check with your health care provider or pharmacist before using salt substitutes.  Cook with plant-based oils, such as canola, sunflower, or  olive oil. Meal planning  When eating at a restaurant, ask that your food be prepared with less salt or no salt, if possible.  Avoid foods that contain MSG (monosodium glutamate). MSG is sometimes added to [place] food, bouillon, and some canned foods. What foods are recommended? The items listed may not be a complete list. Talk with your dietitian about what dietary choices are best for you. Grains Low-sodium cereals, including oats, puffed wheat and rice, and shredded wheat. Low-sodium crackers. Unsalted rice. Unsalted pasta. Low-sodium bread. Whole-grain breads and whole-grain pasta. Vegetables Fresh or frozen vegetables. "No salt added" canned vegetables. "No salt added" tomato sauce and paste. Low-sodium or reduced-sodium tomato and vegetable juice. Fruits Fresh, frozen, or canned fruit. Fruit juice. Meats and other protein foods Fresh or frozen (no salt added) meat, poultry, seafood, and fish. Low-sodium canned tuna and salmon. Unsalted nuts. Dried peas, beans, and lentils without added salt. Unsalted canned beans. Eggs. Unsalted nut butters. Dairy Milk. Soy milk. Cheese that is naturally low in sodium, such as ricotta cheese, fresh mozzarella, or Swiss cheese Low-sodium or reduced-sodium cheese. Cream cheese. Yogurt. Fats and oils Unsalted butter. Unsalted margarine with no trans fat. Vegetable oils such as canola or olive oils. Seasonings and other foods Fresh and dried herbs and spices. Salt-free seasonings. Low-sodium mustard and ketchup. Sodium-free salad dressing. Sodium-free light mayonnaise. Fresh or refrigerated horseradish. Lemon juice. Vinegar. Homemade, reduced-sodium, or low-sodium soups. Unsalted popcorn and pretzels. Low-salt or salt-free chips. What foods are not recommended? The items listed may  not be a complete list. Talk with your dietitian about what dietary choices are best for you. Grains Instant hot cereals. Bread stuffing, pancake, and biscuit mixes. Croutons.  Seasoned rice or pasta mixes. Noodle soup cups. Boxed or frozen macaroni and cheese. Regular salted crackers. Self-rising flour. Vegetables Sauerkraut, pickled vegetables, and relishes. Olives. [place] fries. Onion rings. Regular canned vegetables (not low-sodium or reduced-sodium). Regular canned tomato sauce and paste (not low-sodium or reduced-sodium). Regular tomato and vegetable juice (not low-sodium or reduced-sodium). Frozen vegetables in sauces. Meats and other protein foods Meat or fish that is salted, canned, smoked, spiced, or pickled. Bacon, ham, sausage, hotdogs, corned beef, chipped beef, packaged lunch meats, salt pork, jerky, pickled herring, anchovies, regular canned tuna, sardines, salted nuts. Dairy Processed cheese and cheese spreads. Cheese curds. Blue cheese. Feta cheese. String cheese. Regular cottage cheese. Buttermilk. Canned milk. Fats and oils Salted butter. Regular margarine. Ghee. Bacon fat. Seasonings and other foods Onion salt, garlic salt, seasoned salt, table salt, and sea salt. Canned and packaged gravies. Worcestershire sauce. Tartar sauce. Barbecue sauce. Teriyaki sauce. Soy sauce, including reduced-sodium. Steak sauce. Fish sauce. Oyster sauce. Cocktail sauce. Horseradish that you find on the shelf. Regular ketchup and mustard. Meat flavorings and tenderizers. Bouillon cubes. Hot sauce and Tabasco sauce. Premade or packaged marinades. Premade or packaged taco seasonings. Relishes. Regular salad dressings. Salsa. Potato and tortilla chips. Corn chips and puffs. Salted popcorn and pretzels. Canned or dried soups. Pizza. Frozen entrees and pot pies. Summary  Eating less sodium can help lower your blood pressure, reduce swelling, and protect your heart, liver, and kidneys.  Most people on this plan should limit their sodium intake to 1,500-2,000 mg (milligrams) of sodium each day.  Canned, boxed, and frozen foods are high in sodium. Restaurant foods, fast foods, and  pizza are also very high in sodium. You also get sodium by adding salt to food.  Try to cook at home, eat more fresh fruits and vegetables, and eat less fast food, canned, processed, or prepared foods. This information is not intended to replace advice given to you by your health care provider. Make sure you discuss any questions you have with your health care provider. Document Released: 11/10/2001 Document Revised: 05/14/2016 Document Reviewed: 05/14/2016 Elsevier Interactive Patient Education  2017 [employer].

## 2017-05-25 NOTE — Assessment & Plan Note (Signed)
56 y/o female with recent diagnosis of cirrhosis, hospitalization last month for first episode of decompensation. Required LVAP with no evidence of SBP.  She is unclear on what diuretic she is on.  Reinforced 2 g sodium diet today.  Workup has showed borderline elevated smooth muscle antibody, elevated sed rate and will require further labs. Etiology of cirrhosis unclear at this time.   In the near future she will require EGD for esophageal varices screening. Currently needs time to recuperate from bronchitis. Consider after next visit. Further recommendations to follow.

## 2017-05-27 NOTE — Progress Notes (Signed)
CC'D TO PCP °

## 2017-05-30 ENCOUNTER — Encounter: Payer: Self-pay | Admitting: Internal Medicine

## 2017-05-30 ENCOUNTER — Ambulatory Visit (INDEPENDENT_AMBULATORY_CARE_PROVIDER_SITE_OTHER): Payer: Medicare HMO | Admitting: Internal Medicine

## 2017-05-30 VITALS — BP 100/58 | HR 66 | Ht 66.0 in | Wt 180.0 lb

## 2017-05-30 DIAGNOSIS — K7460 Unspecified cirrhosis of liver: Secondary | ICD-10-CM | POA: Diagnosis not present

## 2017-05-30 DIAGNOSIS — B182 Chronic viral hepatitis C: Secondary | ICD-10-CM

## 2017-05-30 DIAGNOSIS — R600 Localized edema: Secondary | ICD-10-CM

## 2017-05-30 DIAGNOSIS — R42 Dizziness and giddiness: Secondary | ICD-10-CM

## 2017-05-30 DIAGNOSIS — Z79899 Other long term (current) drug therapy: Secondary | ICD-10-CM

## 2017-05-30 MED ORDER — METOPROLOL TARTRATE 25 MG PO TABS: 12.5000 mg | ORAL_TABLET | Freq: Two times a day (BID) | ORAL | 3 refills | Status: DC

## 2017-05-30 NOTE — Progress Notes (Signed)
Cardiology Office Note   Date:  05/30/2017   ID:  [name], DOB Jun 28, 1960, MRN [id number]  PCP:  Redmond School, MD  Cardiologist:   Dorris Carnes, MD   Patient referred by Sherlean Foot for edema      History of Present Illness: [name] is a 56 y.o. 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    Current Meds  Medication Sig  . ALPRAZolam (XANAX) 1 MG tablet Take 1 mg by mouth 3 (three) times daily as needed for anxiety.  . baclofen (LIORESAL) 10 MG tablet Take 10 mg by mouth at bedtime. May take up to 3 times daily for Muscle spasms  . DULoxetine (CYMBALTA) 60 MG capsule Take 60 mg by mouth at bedtime.   Marland Kitchen estradiol (ESTRACE) 1 MG tablet Take 1 mg by mouth daily.  . fentaNYL (DURAGESIC - DOSED MCG/HR) 25 MCG/HR patch Place 1 patch onto the skin every 3 (three) days.  . furosemide (LASIX) 20 MG tablet Take 20 mg by mouth daily.  Marland Kitchen levothyroxine (SYNTHROID, LEVOTHROID) 25 MCG tablet Take 25 mcg by mouth daily.  . metoCLOPramide (REGLAN) 5 MG tablet Take 5 mg by mouth 4 (four) times daily -  before meals and at bedtime.   . metoprolol tartrate (LOPRESSOR) 25 MG tablet Take 1 tablet (25 mg total) by mouth 2 (two) times daily.  . mometasone-formoterol (DULERA) 100-5 MCG/ACT AERO Inhale 2 puffs into the lungs 2 (two) times daily.  Marland Kitchen omeprazole (PRILOSEC) 20 MG capsule Take 20 mg by mouth at bedtime.   Marland Kitchen oxycodone (ROXICODONE) 30 MG immediate release tablet Take 30 mg by mouth every 4 (four) hours as needed for pain.   Marland Kitchen PROAIR HFA 108 (90 BASE) MCG/ACT inhaler Inhale 2 puffs into the lungs daily as  needed for wheezing or shortness of breath.   . spironolactone (ALDACTONE) 100 MG tablet Take 1 tablet (100 mg total) by mouth daily.  Marland Kitchen venlafaxine (EFFEXOR) 75 MG tablet Take 75 mg by mouth daily.  Marland Kitchen zolpidem (AMBIEN) 10 MG tablet Take 10 mg by mouth at bedtime.       Allergies:   Codeine; Latex; Lyrica [pregabalin]; Nsaids; and Other   Past Medical History:  Diagnosis Date  . Anxiety   . Asthma   . COPD (chronic obstructive pulmonary disease) ([place])   . Depression   . Disc degeneration, lumbar   . GERD (gastroesophageal reflux disease)   . Hypertension   . Leukocytosis   . Lumbar disc disease   . Shortness of breath   . Thrombocytosis ([place])     Past Surgical History:  Procedure Laterality Date  . ABDOMINAL HYSTERECTOMY    . BIOPSY  03/25/2014   Procedure: GASTRIC BIOPSIES;  Surgeon: Daneil Dolin, MD;  Location: [facility];  Service: Endoscopy;;  . BRAIN SURGERY    . COLONOSCOPY  02/21/2007   DUK:GURK papilla and internal hemorrhoids, diminutive rectal polyp/Left-sided diverticula (sigmoid) pedunculated polyps mid descending  . COLONOSCOPY  2008   Dr.  Rourk: segmental biopsy negative for microscopic colitis. Three polyps, one tubular adenoma. Surveillance due 2013.   Marland Kitchen COLONOSCOPY WITH PROPOFOL N/A 03/25/2014   RMR: Rectal and colonic polyps removed as described above.  Colonic diverticulosis. CT abnormality likely artifactual in orgin    . ESOPHAGOGASTRODUODENOSCOPY  08/08/2005   BTD:VVOHYW esophagogastroduodenoscopy.  . ESOPHAGOGASTRODUODENOSCOPY  2011   Dr. Gala Romney: normal esophagus, small hiatal hernia, duodenal diverticulum, negative H.pylori and celiac  . 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 Abnormal gastic mucosa of uncertain significance-status post gasrtric biopsy  . MALONEY DILATION N/A 03/25/2014   Procedure: MALONEY ESOPHAGEAL DILATION #54 Fr;  Surgeon:  Daneil Dolin, MD;  Location: [facility];  Service: Endoscopy;  Laterality: N/A;  . POLYPECTOMY  03/25/2014   Procedure: SIGMOID AND RECTAL POLYPECTOMY;  Surgeon: Daneil Dolin, MD;  Location: [facility];  Service: Endoscopy;;  . right knee surgery    . rt breast biopsy    . TOTAL ABDOMINAL HYSTERECTOMY W/ BILATERAL SALPINGOOPHORECTOMY     age 51     Social History:  The patient  reports that she has been smoking cigarettes.  She has a 30.00 pack-year smoking history. she has never used smokeless tobacco. She reports that she does not drink alcohol or use drugs.   Family History:  The patient's family history includes Colon cancer in her paternal aunt; Colon cancer (age of onset: 91) in her brother; Hepatitis C in her brother; Kidney cancer in her maternal uncle and paternal grandfather; Leukemia in her sister; Liver cancer in her brother; Pancreatic cancer in her maternal aunt, maternal uncle, mother, and paternal grandmother. She was adopted.    ROS:  Please see the history of present illness. All other systems are reviewed and  Negative to the above problem except as noted.    PHYSICAL EXAM: VS:  BP (!) 100/58 (BP Location: Right Arm)   Pulse 66   Ht 5\' 6"  (1.676 m)   Wt 180 lb (81.6 kg)   SpO2 96%   BMI 29.05 kg/m   GEN: Well nourished, well developed, in no acute distress  HEENT: normal  Neck: no JVD, carotid bruits, or masses Cardiac: RRR; no murmurs, rubs, or gallops,1+ edema  Respiratory:  Pops  Wheeze  Decreased BS at R base   GI: Sl distended   Mild diffuse tenderness   MS: no deformity Moving all extremities   Skin: warm and dry, no rash Neuro:  Strength and sensation are intact Psych: euthymic mood, full affect   EKG:  EKG is not  ordered today.  On 03/13/17 SR   Possible anteroseptal MI   Lipid Panel    Component Value Date/Time   CHOL 260 (H) 05/04/2017 2020   TRIG 147 05/04/2017 2020   HDL 34 (L) 05/04/2017 2020   CHOLHDL 7.6 05/04/2017 2020   VLDL 29  05/04/2017 2020   LDLCALC 197 (H) 05/04/2017 2020      Wt Readings from Last 3 Encounters:  05/30/17 180 lb (81.6 kg)  05/21/17 197 lb 3.2 oz (89.4 kg)  05/04/17 192 lb 14.4 oz (87.5 kg)      ASSESSMENT AND PLAN:  1  Edema  Most likely related to liver dysfunction.  I do not think it represents cardiac dysfunction  Limit salt and fluid intake  2   Dizzienss  BP is marginal  She could back down on metoprolol to 1x per day  FOllow  symptoms    WIll be available as needed if symptoms change   F/U as needed.      Current medicines are reviewed at length with the patient today.  The patient does not have concerns regarding medicines.  Signed, Dorris Carnes, MD  05/30/2017 2:20 PM    [facility] [place], [place], [place]  [address] Phone: [phone]; Fax: [phone]

## 2017-05-30 NOTE — Patient Instructions (Signed)
Medication Instructions:  Your physician has recommended you make the following change in your medication:  Decrease Metoprolol to 12.5 mg Two Times Daily    Labwork: Your physician recommends that you return for lab work in: Today    Testing/Procedures: NONE   Follow-Up: Your physician recommends that you schedule a follow-up appointment As Needed    Any Other Special Instructions Will Be Listed Below (If Applicable).     If you need a refill on your cardiac medications before your next appointment, please call your pharmacy.  Thank you for choosing [facility]!

## 2017-06-06 DIAGNOSIS — Z6829 Body mass index (BMI) 29.0-29.9, adult: Secondary | ICD-10-CM | POA: Diagnosis not present

## 2017-06-06 DIAGNOSIS — Z1389 Encounter for screening for other disorder: Secondary | ICD-10-CM | POA: Diagnosis not present

## 2017-06-06 DIAGNOSIS — G894 Chronic pain syndrome: Secondary | ICD-10-CM | POA: Diagnosis not present

## 2017-06-06 DIAGNOSIS — R69 Illness, unspecified: Secondary | ICD-10-CM | POA: Diagnosis not present

## 2017-06-13 NOTE — Progress Notes (Signed)
Received medication list from [facility] dated 05/21/2017 List was much different than list her office note.  List from [facility] indicated fentanyl, oxycodone, spironolactone 100 mg daily, Ambien, metoprolol, Xanax, omeprazole 20 mg daily, estradiol.  I did not see Effexor, Lasix, Synthroid, Cymbalta, baclofen on their list.  1-->CAN WE CONFIRM THAT SHE ONLY USES [facility]? 2-->DOES SHE USE ANY MAIL ORDER? 3-->Please encourage her to have blood work done that I request for the last week of December.

## 2017-06-13 NOTE — Progress Notes (Signed)
Attempted to call pt, no answer. Call pt back.

## 2017-07-03 NOTE — Progress Notes (Signed)
Tried calling pt, phone continued to ring with no voicemail

## 2017-07-09 NOTE — Progress Notes (Signed)
Spoke with pt, pt only uses [facility], no mail orders are used. Pt will have lab work done today or tomorrow.

## 2017-07-12 ENCOUNTER — Encounter: Payer: Self-pay | Admitting: Gastroenterology

## 2017-07-12 NOTE — Progress Notes (Signed)
Patient never had her labs done at end of 05/2017.  Please remind her to have labs done and make f/u OV with [facility] only.

## 2017-07-17 DIAGNOSIS — M5127 Other intervertebral disc displacement, lumbosacral region: Secondary | ICD-10-CM | POA: Diagnosis not present

## 2017-07-17 DIAGNOSIS — M4727 Other spondylosis with radiculopathy, lumbosacral region: Secondary | ICD-10-CM | POA: Diagnosis not present

## 2017-07-17 DIAGNOSIS — M4317 Spondylolisthesis, lumbosacral region: Secondary | ICD-10-CM | POA: Diagnosis not present

## 2017-08-29 DIAGNOSIS — E063 Autoimmune thyroiditis: Secondary | ICD-10-CM | POA: Diagnosis not present

## 2017-08-29 DIAGNOSIS — E663 Overweight: Secondary | ICD-10-CM | POA: Diagnosis not present

## 2017-08-29 DIAGNOSIS — Z6829 Body mass index (BMI) 29.0-29.9, adult: Secondary | ICD-10-CM | POA: Diagnosis not present

## 2017-08-29 DIAGNOSIS — R69 Illness, unspecified: Secondary | ICD-10-CM | POA: Diagnosis not present

## 2017-08-29 DIAGNOSIS — G894 Chronic pain syndrome: Secondary | ICD-10-CM | POA: Diagnosis not present

## 2017-08-29 DIAGNOSIS — I10 Essential (primary) hypertension: Secondary | ICD-10-CM | POA: Diagnosis not present

## 2017-08-29 DIAGNOSIS — M5136 Other intervertebral disc degeneration, lumbar region: Secondary | ICD-10-CM | POA: Diagnosis not present

## 2017-10-02 IMAGING — CT CT ABD-PELV W/ CM
2 of 4 series · 15 of 46 positions shown, 17 images · IV contrast (Isovue)
Comparison: Prior CT from 12/09/2013.

CLINICAL DATA: Initial evaluation for chronic pain, increased over
past 4 days, black diarrhea.

EXAM:
CT ABDOMEN AND PELVIS WITH CONTRAST
TECHNIQUE: Multidetector CT imaging of the abdomen and pelvis was performed
using the standard protocol following bolus administration of
intravenous contrast.
CONTRAST:  100mL ZMREFJ-EDD IOPAMIDOL (ZMREFJ-EDD) INJECTION 61%

[Series 2: axial st · axial · 0.81mm/px · z∈[-491,-16]mm · 12 of 107 slices shown, 14 images]
[im 6/107  soft-tissue]
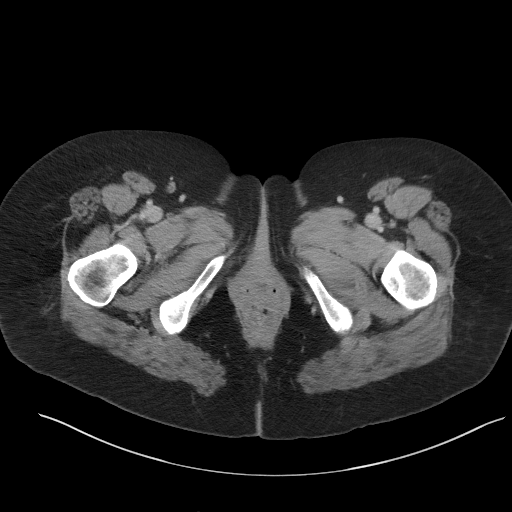
[im 6/107  bone]
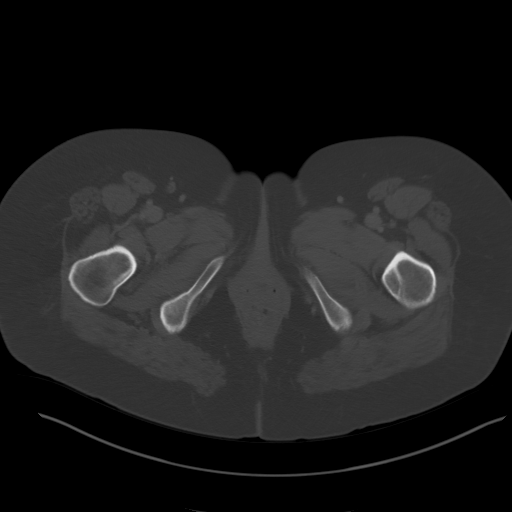
[im 16/107  soft-tissue]
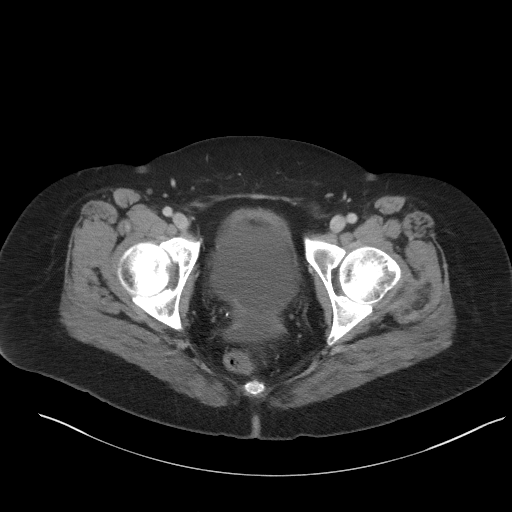
[im 26/107  soft-tissue]
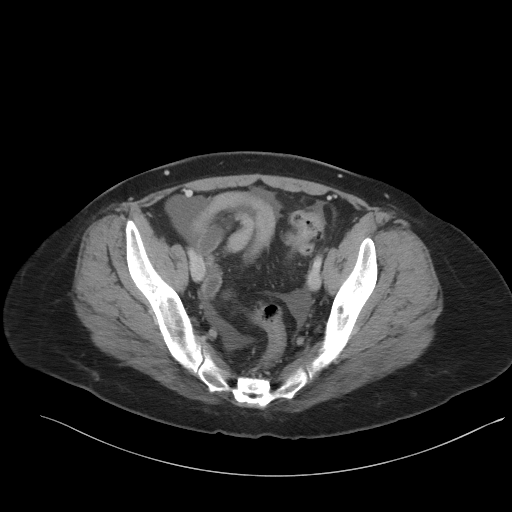
[im 31/107  soft-tissue]
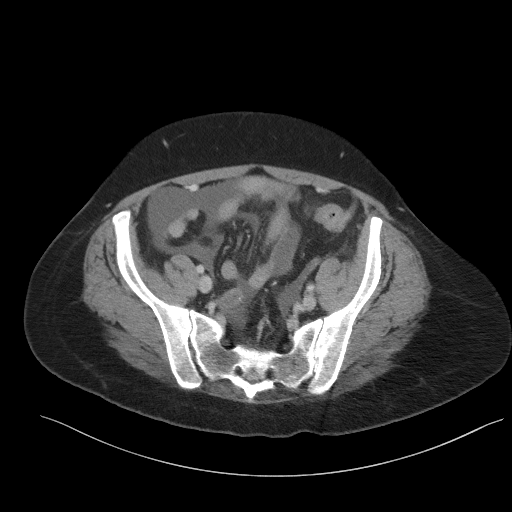
[im 41/107  soft-tissue]
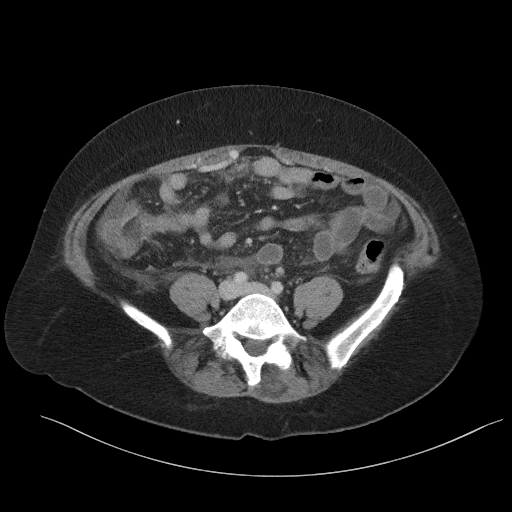
[im 51/107  soft-tissue]
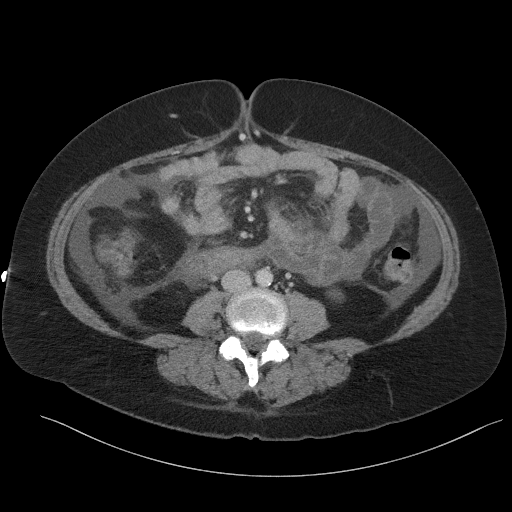
[im 56/107  soft-tissue]
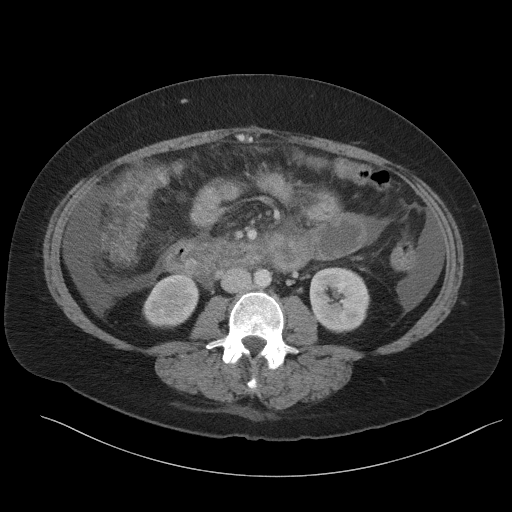
[im 66/107  soft-tissue]
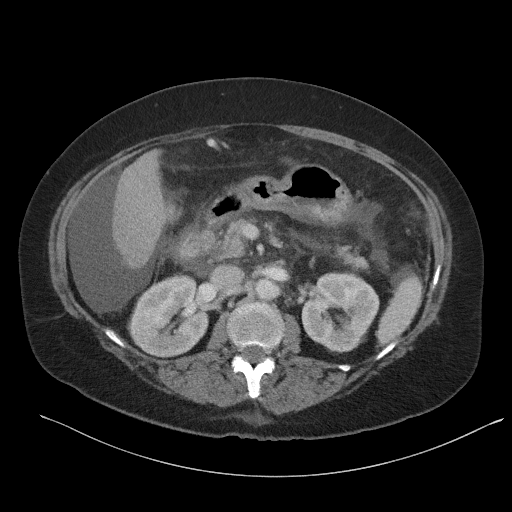
[im 76/107  soft-tissue]
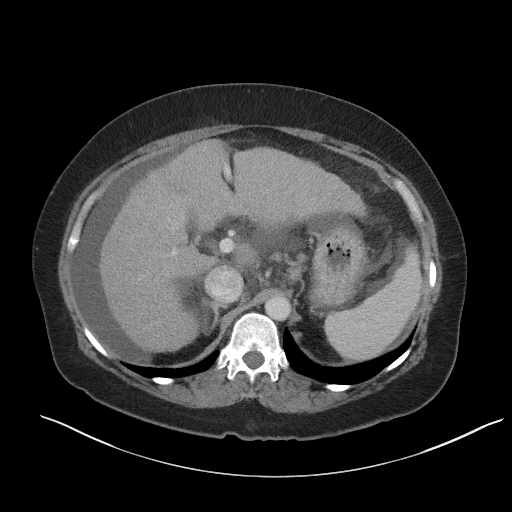
[im 76/107  bone]
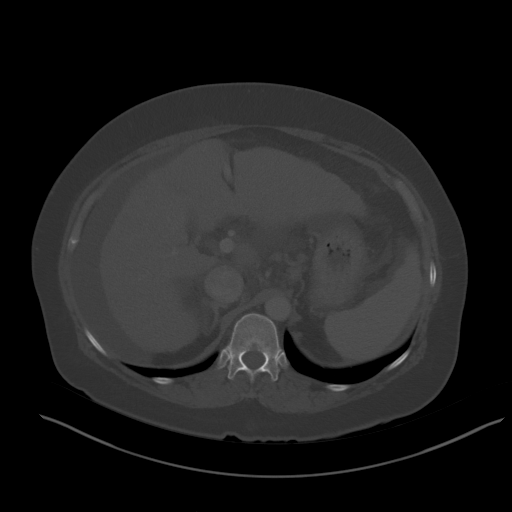
[im 81/107  soft-tissue]
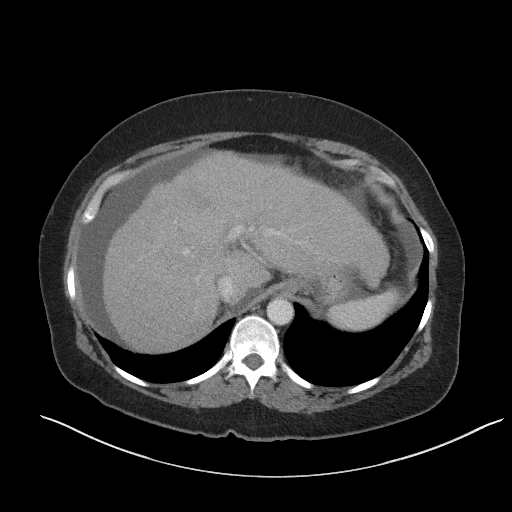
[im 91/107  soft-tissue]
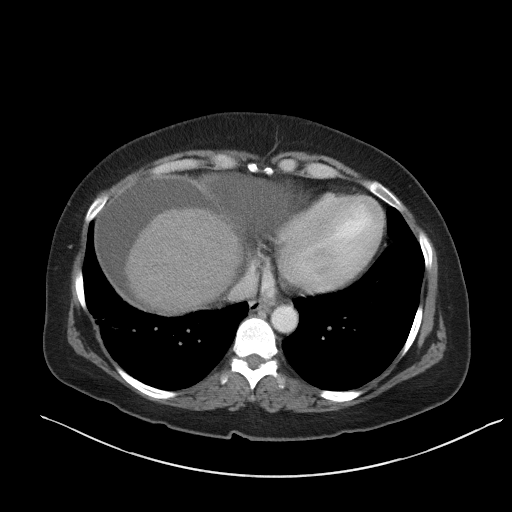
[im 101/107  soft-tissue]
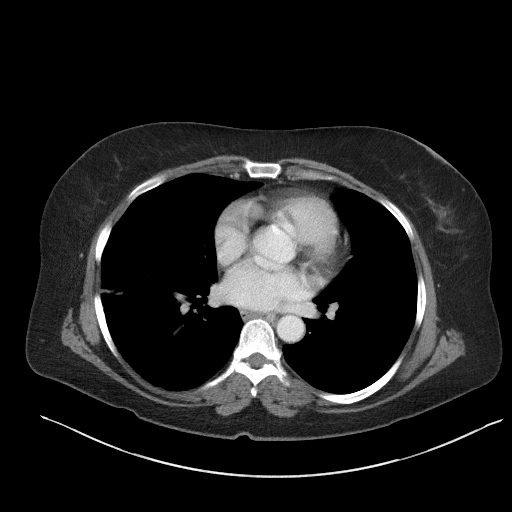

[Series 5: coronal st · coronal · 0.93mm/px · 3 of 117 slices shown]
[im 39/117  soft-tissue]
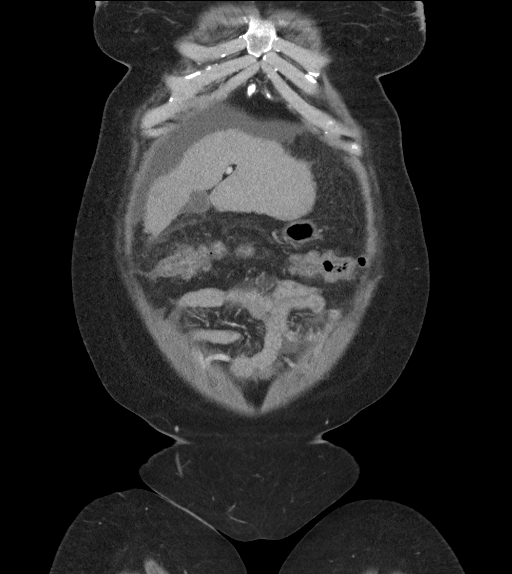
[im 52/117  soft-tissue]
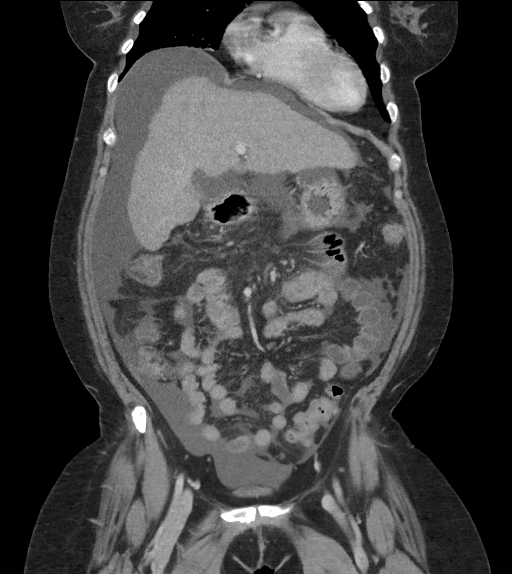
[im 65/117  soft-tissue]
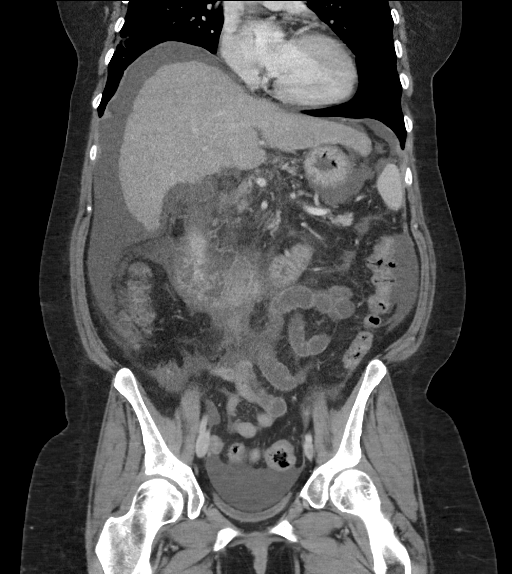

[15 of 46 positions shown; findings below may reference images not displayed]

FINDINGS: Lower chest: Scattered atelectatic changes present within the
visualized lung bases. Visualized lung bases are otherwise clear. No
pleural or pericardial effusion.

Hepatobiliary: Liver is heterogeneous with diffuse nodular contour,
consistent with cirrhosis, advanced relative to previous exam. No
definite focal intrahepatic lesions, although evaluation limited on
this exam. Gallbladder within normal limits. Small amount of free
pericholecystic fluid like related to ascites/liver disease. No
biliary dilatation.

Pancreas: Pancreas within normal limits without acute inflammatory
changes or mass lesion.

Spleen: Spleen within normal limits.  No splenomegaly.

Adrenals/Urinary Tract: The adrenal glands are normal. Kidneys equal
in size with symmetric enhancement. No nephrolithiasis,
hydronephrosis, or focal enhancing renal mass. No hydroureter.
Bladder largely decompressed without acute abnormality.

Stomach/Bowel: Stomach within normal limits. No evidence for bowel
obstruction. Colon largely decompressed. Mild circumferential wall
thickening about the transverse and ascending colon favored to be
related incomplete distension and/ or midline hepatic enteropathy.
Superimposed acute colitis could be considered in the correct
clinical setting. Appendix is absent. No other significant
inflammatory changes seen about the bowels.

Vascular/Lymphatic: Normal intravascular enhancement seen throughout
the intra-abdominal aorta and its branch vessels. Mild aorto
bi-iliac atherosclerotic disease. No aneurysm.

Scattered gastrohepatic and paraesophageal varices with recannulized
paraumbilical vein, consistent with portal hypertension. Portal vein
and SMV are patent.

No pathologically enlarged intra-abdominal or pelvic lymph nodes.

Reproductive: Uterus is absent.  Ovaries not discretely identified.

Other: Moderate volume ascites.  No free intraperitoneal air.

Musculoskeletal: No acute osseus abnormality. No worrisome lytic or
blastic osseous lesions. Bilateral facet arthropathy noted within
the lumbar spine.
IMPRESSION: 1. Hepatic cirrhosis with evidence for portal hypertension.
Associated moderate volume ascites within the abdomen.
2. Circumferential wall thickening about the ascending and
transverse colon, favored to be related to incomplete distension
and/or hepatic enteropathy. Superimposed acute colitis could be
considered in the correct clinical setting.
3. Aortic atherosclerosis.

## 2017-11-18 DIAGNOSIS — M5136 Other intervertebral disc degeneration, lumbar region: Secondary | ICD-10-CM | POA: Diagnosis not present

## 2017-11-18 DIAGNOSIS — Z1389 Encounter for screening for other disorder: Secondary | ICD-10-CM | POA: Diagnosis not present

## 2017-11-18 DIAGNOSIS — G894 Chronic pain syndrome: Secondary | ICD-10-CM | POA: Diagnosis not present

## 2017-11-18 DIAGNOSIS — S025XXA Fracture of tooth (traumatic), initial encounter for closed fracture: Secondary | ICD-10-CM | POA: Diagnosis not present

## 2017-11-18 DIAGNOSIS — Z6829 Body mass index (BMI) 29.0-29.9, adult: Secondary | ICD-10-CM | POA: Diagnosis not present

## 2017-11-18 DIAGNOSIS — R69 Illness, unspecified: Secondary | ICD-10-CM | POA: Diagnosis not present

## 2018-01-06 ENCOUNTER — Other Ambulatory Visit: Payer: Self-pay | Admitting: Gastroenterology

## 2018-02-18 DIAGNOSIS — Z6830 Body mass index (BMI) 30.0-30.9, adult: Secondary | ICD-10-CM | POA: Diagnosis not present

## 2018-02-18 DIAGNOSIS — Z1389 Encounter for screening for other disorder: Secondary | ICD-10-CM | POA: Diagnosis not present

## 2018-02-18 DIAGNOSIS — G43009 Migraine without aura, not intractable, without status migrainosus: Secondary | ICD-10-CM | POA: Diagnosis not present

## 2018-02-18 DIAGNOSIS — M5136 Other intervertebral disc degeneration, lumbar region: Secondary | ICD-10-CM | POA: Diagnosis not present

## 2018-02-18 DIAGNOSIS — E6609 Other obesity due to excess calories: Secondary | ICD-10-CM | POA: Diagnosis not present

## 2018-02-18 DIAGNOSIS — R69 Illness, unspecified: Secondary | ICD-10-CM | POA: Diagnosis not present

## 2018-05-15 DIAGNOSIS — Z1389 Encounter for screening for other disorder: Secondary | ICD-10-CM | POA: Diagnosis not present

## 2018-05-15 DIAGNOSIS — Z6831 Body mass index (BMI) 31.0-31.9, adult: Secondary | ICD-10-CM | POA: Diagnosis not present

## 2018-05-15 DIAGNOSIS — R69 Illness, unspecified: Secondary | ICD-10-CM | POA: Diagnosis not present

## 2018-05-15 DIAGNOSIS — M5136 Other intervertebral disc degeneration, lumbar region: Secondary | ICD-10-CM | POA: Diagnosis not present

## 2018-05-15 DIAGNOSIS — G894 Chronic pain syndrome: Secondary | ICD-10-CM | POA: Diagnosis not present

## 2018-05-15 DIAGNOSIS — E6609 Other obesity due to excess calories: Secondary | ICD-10-CM | POA: Diagnosis not present

## 2018-05-15 DIAGNOSIS — Z0001 Encounter for general adult medical examination with abnormal findings: Secondary | ICD-10-CM | POA: Diagnosis not present

## 2018-06-12 DIAGNOSIS — G894 Chronic pain syndrome: Secondary | ICD-10-CM | POA: Diagnosis not present

## 2018-06-12 DIAGNOSIS — R140 Abdominal distension (gaseous): Secondary | ICD-10-CM | POA: Diagnosis not present

## 2018-06-12 DIAGNOSIS — Z6831 Body mass index (BMI) 31.0-31.9, adult: Secondary | ICD-10-CM | POA: Diagnosis not present

## 2018-06-12 DIAGNOSIS — E6609 Other obesity due to excess calories: Secondary | ICD-10-CM | POA: Diagnosis not present

## 2018-06-12 DIAGNOSIS — R69 Illness, unspecified: Secondary | ICD-10-CM | POA: Diagnosis not present

## 2018-07-09 DIAGNOSIS — E063 Autoimmune thyroiditis: Secondary | ICD-10-CM | POA: Diagnosis not present

## 2018-07-09 DIAGNOSIS — I700 Atherosclerosis of aorta: Secondary | ICD-10-CM | POA: Diagnosis not present

## 2018-07-09 DIAGNOSIS — Z6831 Body mass index (BMI) 31.0-31.9, adult: Secondary | ICD-10-CM | POA: Diagnosis not present

## 2018-07-09 DIAGNOSIS — G894 Chronic pain syndrome: Secondary | ICD-10-CM | POA: Diagnosis not present

## 2018-07-25 ENCOUNTER — Other Ambulatory Visit: Payer: Self-pay | Admitting: Internal Medicine

## 2018-07-31 ENCOUNTER — Other Ambulatory Visit (HOSPITAL_COMMUNITY): Payer: Self-pay | Admitting: Emergency Medicine

## 2018-07-31 ENCOUNTER — Other Ambulatory Visit (HOSPITAL_COMMUNITY): Payer: Self-pay | Admitting: Internal Medicine

## 2018-07-31 DIAGNOSIS — Z1231 Encounter for screening mammogram for malignant neoplasm of breast: Secondary | ICD-10-CM

## 2018-08-07 DIAGNOSIS — Z6831 Body mass index (BMI) 31.0-31.9, adult: Secondary | ICD-10-CM | POA: Diagnosis not present

## 2018-08-07 DIAGNOSIS — R69 Illness, unspecified: Secondary | ICD-10-CM | POA: Diagnosis not present

## 2018-08-07 DIAGNOSIS — F320 Major depressive disorder, single episode, mild: Secondary | ICD-10-CM | POA: Diagnosis not present

## 2018-08-07 DIAGNOSIS — M5136 Other intervertebral disc degeneration, lumbar region: Secondary | ICD-10-CM | POA: Diagnosis not present

## 2018-08-07 DIAGNOSIS — Z1389 Encounter for screening for other disorder: Secondary | ICD-10-CM | POA: Diagnosis not present

## 2018-08-07 DIAGNOSIS — G894 Chronic pain syndrome: Secondary | ICD-10-CM | POA: Diagnosis not present

## 2018-09-04 DIAGNOSIS — M5136 Other intervertebral disc degeneration, lumbar region: Secondary | ICD-10-CM | POA: Diagnosis not present

## 2018-09-04 DIAGNOSIS — Z1389 Encounter for screening for other disorder: Secondary | ICD-10-CM | POA: Diagnosis not present

## 2018-09-04 DIAGNOSIS — Z681 Body mass index (BMI) 19 or less, adult: Secondary | ICD-10-CM | POA: Diagnosis not present

## 2018-09-04 DIAGNOSIS — G894 Chronic pain syndrome: Secondary | ICD-10-CM | POA: Diagnosis not present

## 2018-10-06 DIAGNOSIS — G894 Chronic pain syndrome: Secondary | ICD-10-CM | POA: Diagnosis not present

## 2018-10-06 DIAGNOSIS — Z1389 Encounter for screening for other disorder: Secondary | ICD-10-CM | POA: Diagnosis not present

## 2018-10-06 DIAGNOSIS — Z6831 Body mass index (BMI) 31.0-31.9, adult: Secondary | ICD-10-CM | POA: Diagnosis not present

## 2018-10-06 DIAGNOSIS — R69 Illness, unspecified: Secondary | ICD-10-CM | POA: Diagnosis not present

## 2018-11-05 DIAGNOSIS — Z6828 Body mass index (BMI) 28.0-28.9, adult: Secondary | ICD-10-CM | POA: Diagnosis not present

## 2018-11-05 DIAGNOSIS — G894 Chronic pain syndrome: Secondary | ICD-10-CM | POA: Diagnosis not present

## 2018-11-05 DIAGNOSIS — R69 Illness, unspecified: Secondary | ICD-10-CM | POA: Diagnosis not present

## 2018-12-03 DIAGNOSIS — Z6832 Body mass index (BMI) 32.0-32.9, adult: Secondary | ICD-10-CM | POA: Diagnosis not present

## 2018-12-03 DIAGNOSIS — E6609 Other obesity due to excess calories: Secondary | ICD-10-CM | POA: Diagnosis not present

## 2018-12-03 DIAGNOSIS — G894 Chronic pain syndrome: Secondary | ICD-10-CM | POA: Diagnosis not present

## 2018-12-30 ENCOUNTER — Ambulatory Visit: Payer: Medicare HMO | Admitting: Gastroenterology

## 2019-01-05 DIAGNOSIS — R69 Illness, unspecified: Secondary | ICD-10-CM | POA: Diagnosis not present

## 2019-01-05 DIAGNOSIS — E6609 Other obesity due to excess calories: Secondary | ICD-10-CM | POA: Diagnosis not present

## 2019-01-05 DIAGNOSIS — G894 Chronic pain syndrome: Secondary | ICD-10-CM | POA: Diagnosis not present

## 2019-01-05 DIAGNOSIS — Z6832 Body mass index (BMI) 32.0-32.9, adult: Secondary | ICD-10-CM | POA: Diagnosis not present

## 2019-01-28 DIAGNOSIS — G894 Chronic pain syndrome: Secondary | ICD-10-CM | POA: Diagnosis not present

## 2019-01-28 DIAGNOSIS — M1991 Primary osteoarthritis, unspecified site: Secondary | ICD-10-CM | POA: Diagnosis not present

## 2019-01-28 DIAGNOSIS — E063 Autoimmune thyroiditis: Secondary | ICD-10-CM | POA: Diagnosis not present

## 2019-01-28 DIAGNOSIS — K7290 Hepatic failure, unspecified without coma: Secondary | ICD-10-CM | POA: Diagnosis not present

## 2019-01-28 DIAGNOSIS — E6609 Other obesity due to excess calories: Secondary | ICD-10-CM | POA: Diagnosis not present

## 2019-01-28 DIAGNOSIS — R69 Illness, unspecified: Secondary | ICD-10-CM | POA: Diagnosis not present

## 2019-01-28 DIAGNOSIS — Z6833 Body mass index (BMI) 33.0-33.9, adult: Secondary | ICD-10-CM | POA: Diagnosis not present

## 2019-02-06 ENCOUNTER — Encounter: Payer: Self-pay | Admitting: Gastroenterology

## 2019-02-06 ENCOUNTER — Other Ambulatory Visit: Payer: Self-pay

## 2019-02-06 ENCOUNTER — Ambulatory Visit: Payer: Medicare HMO | Admitting: Gastroenterology

## 2019-02-06 VITALS — BP 94/68 | HR 79 | Temp 97.1°F | Ht 66.0 in | Wt 186.6 lb

## 2019-02-06 DIAGNOSIS — B370 Candidal stomatitis: Secondary | ICD-10-CM | POA: Diagnosis not present

## 2019-02-06 DIAGNOSIS — K7290 Hepatic failure, unspecified without coma: Secondary | ICD-10-CM | POA: Diagnosis not present

## 2019-02-06 DIAGNOSIS — Z800 Family history of malignant neoplasm of digestive organs: Secondary | ICD-10-CM | POA: Diagnosis not present

## 2019-02-06 DIAGNOSIS — K219 Gastro-esophageal reflux disease without esophagitis: Secondary | ICD-10-CM

## 2019-02-06 DIAGNOSIS — R1011 Right upper quadrant pain: Secondary | ICD-10-CM

## 2019-02-06 DIAGNOSIS — K5900 Constipation, unspecified: Secondary | ICD-10-CM

## 2019-02-06 MED ORDER — SPIRONOLACTONE 50 MG PO TABS: 50.0000 mg | ORAL_TABLET | Freq: Every day | ORAL | 1 refills | Status: DC

## 2019-02-06 MED ORDER — FUROSEMIDE 20 MG PO TABS: 20.0000 mg | ORAL_TABLET | Freq: Every day | ORAL | 1 refills | Status: DC

## 2019-02-06 MED ORDER — FLUCONAZOLE 200 MG PO TABS: 200.0000 mg | ORAL_TABLET | Freq: Every day | ORAL | 0 refills | Status: AC

## 2019-02-06 NOTE — Progress Notes (Signed)
Primary Care Physician: Redmond School, MD  Primary Gastroenterologist:  Garfield Cornea, MD   Chief Complaint  Patient presents with  . Cirrhosis    abd swelling    HPI: STEPHENIE [name] is a 58 y.o. female here for follow up cirrhosis/abdominal swelling.   Last seen in 2018. Etiology unclear for cirrhosis. Has had mildly elevated smooth muscle antibody and abnormal alkphos, elevated sed rate in 2018. Numerous other serologies negative. She is immune to hep B.   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  No H. pylori.  Rectal and colonic polyps removed, colonic diverticulosis.  Next colonoscopy planned for 5 years  C/o four weeks of recurrent swelling. Abdomen feels tight. Feels squeezing pressure. If tries to lay down hard to breath. Off inhalers for several years. Not sure why doesn't have them. Ashtma/copd. Sob with ambulation. Trying quit smoking. Used to be 1ppd, in past year 1/2 ppd. Either constipation/diarrhea. Has to take omeprazole for heartburn.   Having trouble with thrush. Generic diflucan 100mg  one daily for 7 days doesn't help.has had monthly treatment for past four months. Currently on nystatin as well. Has taken name brand Diflcuan 150mg  two the first day followed by once daily for four days in the past which seemed to work better. Drink or eats hurts. Feels like the irritation goes down into the throat.   No etoh. No prior heavy etoh. Consumes low sodium diet. While on vacation she had significant swelling, legs four times normal size.    Current Outpatient Medications  Medication Sig Dispense Refill  . ALPRAZolam (XANAX) 1 MG tablet Take 1 mg by mouth 3 (three) times daily as needed for anxiety.    . AMITIZA 24 MCG capsule TAKE 1 CAPSULE TWICE DAILY WITH MEALS. 60 capsule 5  . baclofen (LIORESAL) 10 MG tablet Take 10 mg by mouth at  bedtime. May take up to 3 times daily for Muscle spasms    . DULoxetine (CYMBALTA) 60 MG capsule Take 90 mg by mouth at bedtime.     Marland Kitchen estradiol (ESTRACE) 1 MG tablet Take 1 mg by mouth daily.    . Fluconazole (DIFLUCAN PO) Take by mouth as needed.    Marland Kitchen levothyroxine (SYNTHROID, LEVOTHROID) 25 MCG tablet Take 25 mcg by mouth daily.    . metoCLOPramide (REGLAN) 5 MG tablet Take 5 mg by mouth 4 (four) times daily.    . metoprolol tartrate (LOPRESSOR) 25 MG tablet TAKE (1/2) TABLET BY MOUTH TWICE DAILY. 90 tablet 3  . nystatin (MYCOSTATIN) 100000 UNIT/ML suspension Take 5 mLs by mouth 4 (four) times daily.    Marland Kitchen omeprazole (PRILOSEC) 20 MG capsule Take 20 mg by mouth at bedtime.     Marland Kitchen oxyCODONE (ROXICODONE) 15 MG immediate release tablet Take 1-2 tablets by mouth every 4 (four) hours as needed.     . risperiDONE (RISPERDAL) 1 MG tablet Take 1 mg by mouth 2 (two) times daily.    Marland Kitchen venlafaxine (EFFEXOR) 75 MG tablet Take 75 mg by mouth daily.    Marland Kitchen zolpidem (AMBIEN) 10 MG tablet Take 10 mg by mouth at bedtime.      Marland Kitchen spironolactone (ALDACTONE) 100 MG tablet Take 1 tablet (100 mg total) by mouth daily. 30 tablet 11   No current facility-administered medications for this visit.     Allergies as of 02/06/2019 - Review Complete  02/06/2019  Allergen Reaction Noted  . Codeine Hives and Other (See Comments) 03/03/2008  . Latex Hives and Itching 02/23/2011  . Lyrica [pregabalin] Hives 11/20/2016  . Nsaids  11/20/2016  . Other Hives and Itching 02/23/2013   Past Medical History:  Diagnosis Date  . Anxiety   . Asthma   . COPD (chronic obstructive pulmonary disease) ([facility] [facility])   . Depression   . Disc degeneration, lumbar   . GERD (gastroesophageal reflux disease)   . Hypertension   . Leukocytosis   . Lumbar disc disease   . Shortness of breath   . Thrombocytosis ([place])    Past Surgical History:  Procedure Laterality Date  . ABDOMINAL HYSTERECTOMY    . BIOPSY  03/25/2014   Procedure: GASTRIC  BIOPSIES;  Surgeon: Daneil Dolin, MD;  Location: [facility];  Service: Endoscopy;;  . BRAIN SURGERY    . COLONOSCOPY  02/21/2007   [id number] papilla and internal hemorrhoids, diminutive rectal polyp/Left-sided diverticula (sigmoid) pedunculated polyps mid descending  . COLONOSCOPY  2008   Dr. Gala Romney: segmental biopsy negative for microscopic colitis. Three polyps, one tubular adenoma. Surveillance due 2013.   Marland Kitchen COLONOSCOPY WITH PROPOFOL N/A 03/25/2014   [facility]: Rectal and colonic polyps removed as described above.  Colonic diverticulosis. CT abnormality likely artifactual in orgin    . ESOPHAGOGASTRODUODENOSCOPY  08/08/2005   [id number] esophagogastroduodenoscopy.  . ESOPHAGOGASTRODUODENOSCOPY  2011   Dr. Gala Romney: normal esophagus, small hiatal hernia, duodenal diverticulum, negative H.pylori and celiac  . 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 Abnormal gastic mucosa of uncertain significance-status post gasrtric biopsy  . MALONEY DILATION N/A 03/25/2014   Procedure: MALONEY ESOPHAGEAL DILATION #54 Fr;  Surgeon: Daneil Dolin, MD;  Location: [facility];  Service: Endoscopy;  Laterality: N/A;  . POLYPECTOMY  03/25/2014   Procedure: SIGMOID AND RECTAL POLYPECTOMY;  Surgeon: Daneil Dolin, MD;  Location: [facility];  Service: Endoscopy;;  . right knee surgery    . rt breast biopsy    . TOTAL ABDOMINAL HYSTERECTOMY W/ BILATERAL SALPINGOOPHORECTOMY     age 28   Family History  Adopted: Yes  Problem Relation Age of Onset  . Pancreatic cancer Mother   . Hepatitis C Brother   . Liver cancer Brother        suicide  . Pancreatic cancer Maternal Aunt   . Kidney cancer Maternal Uncle   . Colon cancer Paternal Aunt   . Kidney cancer Paternal Grandfather   . Colon cancer Brother 61  . Leukemia Sister        1/2 sister  . Pancreatic cancer Paternal Grandmother   . Pancreatic cancer Maternal Uncle     Social History   Tobacco Use  . Smoking status: Current Every Day Smoker    Packs/day: 1.00    Years: 30.00    Pack years: 30.00    Types: Cigarettes  . Smokeless tobacco: Never Used  Substance Use Topics  . Alcohol use: No    Comment: see HPI. Patient denies alcohol use at present  . Drug use: No     ROS:  General: Negative for anorexia, weight loss, fever, chills, fatigue, weakness. ENT: Negative for hoarseness, difficulty swallowing , nasal congestion. CV: Negative for chest pain, angina, palpitations, dyspnea on exertion, peripheral edema.  Respiratory: Negative for dyspnea at rest, dyspnea on exertion, cough, sputum, wheezing.  GI: See history of present illness. GU:  Negative for dysuria, hematuria,  urinary incontinence, urinary frequency, nocturnal urination.  Endo: Negative for unusual weight change.    Physical Examination:   BP 94/68   Pulse 79   Temp (!) 97.1 F (36.2 C) (Temporal)   Ht 5\' 6"  (1.676 m)   Wt 186 lb 9.6 oz (84.6 kg)   BMI 30.12 kg/m   General: Well-nourished, well-developed in no acute distress.  Eyes: No icterus. Mouth: Oropharyngeal mucosa moist and pink , tongue beefy red. No white patches. She has cracks in creases of her mouth. Lungs: rhonchi noted right lung base cleared with cough.  Heart: Regular rate and rhythm, no murmurs rubs or gallops.  Abdomen: Bowel sounds are normal, mild upper abd tenderness, more ruq/epig, nondistended, no hepatosplenomegaly or masses, no abdominal bruits or hernia , no rebound or guarding.   Extremities: 1+ lower extremity edema. No clubbing or deformities. Neuro: Alert and oriented x 4   Skin: Warm and dry, no jaundice.   Psych: Alert and cooperative, normal mood and affect.   Imaging Studies: No results found.

## 2019-02-06 NOTE — Assessment & Plan Note (Signed)
She reports persistent/recurrent thrush despite numerous rounds of diflucan 100mg  daily for 7 days. She has has essentially one course per month for past several months. Also on nystatin. She noted improvement with higher dose diflucan. On exam she has glossitis. No white plaques but this could be due to partially treated thrush. She also feels irritation going down back of throat with some burning with swallowing. Treat with diflucan 200mg  for 14 days. If no improvement then consider other etiologies, ie B12/folate defiency.

## 2019-02-06 NOTE — Patient Instructions (Signed)
I will review your medications and send over prescriptions later today for two fluid pills.   Please have your labs and ultrasound done.   We will be in touch with results as available and discuss next step.

## 2019-02-06 NOTE — Assessment & Plan Note (Signed)
Well controlled on omeprazole 20mg daily.  °

## 2019-02-06 NOTE — Assessment & Plan Note (Signed)
58 y/o female diagnosed with cirrhosis in 2018, etiology unclear, required paracentesis at time of presentation. No follow up since that time. Complains of recent recurrence of swelling in lower extremities and abdomen. Seem to start after vacation with family. She reports typically consumes 2 gram sodium restricted diet while at home. Cooks fresh foods. She has been on her diuretics for some time. She needs updated labs and u/s. Will add lasix 20mg  daily and aldactone 50mg  daily. Cautioned her regarding signs/symptoms of low BP given her low normal BPs.   She will need EGD for esophageal variceal screening in the future. Plan in the next couple of months along with colonoscopy (she is due for personal h/o colon polyps and FH CRC).

## 2019-02-06 NOTE — Assessment & Plan Note (Signed)
Continue amitiza 43mcg bid

## 2019-02-11 ENCOUNTER — Ambulatory Visit (HOSPITAL_COMMUNITY)
Admission: RE | Admit: 2019-02-11 | Discharge: 2019-02-11 | Disposition: A | Discharge status: Home / Self Care | Payer: Medicare HMO | Source: Ambulatory Visit | Attending: Gastroenterology | Admitting: Gastroenterology

## 2019-02-11 ENCOUNTER — Other Ambulatory Visit: Payer: Self-pay

## 2019-02-11 DIAGNOSIS — Z800 Family history of malignant neoplasm of digestive organs: Secondary | ICD-10-CM | POA: Insufficient documentation

## 2019-02-11 DIAGNOSIS — R1011 Right upper quadrant pain: Secondary | ICD-10-CM | POA: Diagnosis not present

## 2019-02-11 DIAGNOSIS — K219 Gastro-esophageal reflux disease without esophagitis: Secondary | ICD-10-CM

## 2019-02-11 DIAGNOSIS — B370 Candidal stomatitis: Secondary | ICD-10-CM | POA: Insufficient documentation

## 2019-02-11 DIAGNOSIS — K7460 Unspecified cirrhosis of liver: Secondary | ICD-10-CM | POA: Diagnosis not present

## 2019-02-11 DIAGNOSIS — K7290 Hepatic failure, unspecified without coma: Secondary | ICD-10-CM | POA: Diagnosis not present

## 2019-02-11 DIAGNOSIS — K5900 Constipation, unspecified: Secondary | ICD-10-CM | POA: Diagnosis not present

## 2019-02-13 LAB — CBC WITH DIFFERENTIAL/PLATELET
Absolute Monocytes: 705 cells/uL (ref 200–950)
Basophils Absolute: 60 cells/uL (ref 0–200)
Basophils Relative: 0.8 %
Eosinophils Absolute: 420 cells/uL (ref 15–500)
Eosinophils Relative: 5.6 %
HCT: 32.5 % — ABNORMAL LOW (ref 35.0–45.0)
Hemoglobin: 10.8 g/dL — ABNORMAL LOW (ref 11.7–15.5)
Lymphs Abs: 2483 cells/uL (ref 850–3900)
MCH: 30.5 pg (ref 27.0–33.0)
MCHC: 33.2 g/dL (ref 32.0–36.0)
MCV: 91.8 fL (ref 80.0–100.0)
MPV: 11 fL (ref 7.5–12.5)
Monocytes Relative: 9.4 %
Neutro Abs: 3833 cells/uL (ref 1500–7800)
Neutrophils Relative %: 51.1 %
Platelets: 263 10*3/uL (ref 140–400)
RBC: 3.54 10*6/uL — ABNORMAL LOW (ref 3.80–5.10)
RDW: 12.5 % (ref 11.0–15.0)
Total Lymphocyte: 33.1 %
WBC: 7.5 10*3/uL (ref 3.8–10.8)

## 2019-02-13 LAB — ANTI-SMOOTH MUSCLE ANTIBODY, IGG: Actin (Smooth Muscle) Antibody (IGG): <20 U (ref <20)

## 2019-02-13 LAB — PROTIME-INR
INR: 1
Prothrombin Time: 10.8 s (ref 9.0–11.5)

## 2019-02-13 LAB — COMPREHENSIVE METABOLIC PANEL
AG Ratio: 1.4 (calc) (ref 1.0–2.5)
ALT: 8 U/L (ref 6–29)
AST: 20 U/L (ref 10–35)
Albumin: 3.5 g/dL — ABNORMAL LOW (ref 3.6–5.1)
Alkaline phosphatase (APISO): 211 U/L — ABNORMAL HIGH (ref 37–153)
BUN/Creatinine Ratio: 14 (calc) (ref 6–22)
BUN: 16 mg/dL (ref 7–25)
CO2: 26 mmol/L (ref 20–32)
Calcium: 8.5 mg/dL — ABNORMAL LOW (ref 8.6–10.4)
Chloride: 107 mmol/L (ref 98–110)
Creat: 1.17 mg/dL — ABNORMAL HIGH (ref 0.50–1.05)
Globulin: 2.5 g/dL (calc) (ref 1.9–3.7)
Glucose, Bld: 95 mg/dL (ref 65–139)
Potassium: 4.6 mmol/L (ref 3.5–5.3)
Sodium: 140 mmol/L (ref 135–146)
Total Bilirubin: 0.4 mg/dL (ref 0.2–1.2)
Total Protein: 6 g/dL — ABNORMAL LOW (ref 6.1–8.1)

## 2019-02-13 LAB — LIPASE: Lipase: 18 U/L (ref 7–60)

## 2019-02-19 ENCOUNTER — Ambulatory Visit: Payer: Medicare HMO | Admitting: Orthopaedic Surgery

## 2019-02-23 ENCOUNTER — Other Ambulatory Visit: Payer: Self-pay

## 2019-02-23 ENCOUNTER — Encounter: Payer: Self-pay | Admitting: *Deleted

## 2019-02-23 ENCOUNTER — Telehealth: Payer: Self-pay | Admitting: Internal Medicine

## 2019-02-23 ENCOUNTER — Other Ambulatory Visit: Payer: Self-pay | Admitting: *Deleted

## 2019-02-23 DIAGNOSIS — Z800 Family history of malignant neoplasm of digestive organs: Secondary | ICD-10-CM

## 2019-02-23 DIAGNOSIS — Z86010 Personal history of colonic polyps: Secondary | ICD-10-CM

## 2019-02-23 DIAGNOSIS — D649 Anemia, unspecified: Secondary | ICD-10-CM

## 2019-02-23 DIAGNOSIS — K7290 Hepatic failure, unspecified without coma: Secondary | ICD-10-CM

## 2019-02-23 MED ORDER — NA SULFATE-K SULFATE-MG SULF 17.5-3.13-1.6 GM/177ML PO SOLN: 1.0000 | Freq: Once | ORAL | 0 refills | Status: AC

## 2019-02-23 NOTE — Telephone Encounter (Signed)
Pt was calling to see if her labs and [place] results were back yet. Please call [phone]

## 2019-02-24 ENCOUNTER — Telehealth: Payer: Self-pay | Admitting: Gastroenterology

## 2019-02-24 ENCOUNTER — Encounter: Payer: Self-pay | Admitting: Internal Medicine

## 2019-02-24 ENCOUNTER — Encounter: Payer: Self-pay | Admitting: *Deleted

## 2019-02-24 NOTE — Telephone Encounter (Signed)
Noted  

## 2019-02-24 NOTE — Telephone Encounter (Signed)
See abd u/s result note. Patient has new CBD dilation, recommend MR abd/MRCP to evaluate.

## 2019-02-24 NOTE — Progress Notes (Signed)
PATIENT SCHEDULED AND LETTER SENT  °

## 2019-02-24 NOTE — Telephone Encounter (Signed)
Tried calling pt, no VM available. Will call pt back.

## 2019-02-26 DIAGNOSIS — M25561 Pain in right knee: Secondary | ICD-10-CM | POA: Diagnosis not present

## 2019-02-26 DIAGNOSIS — G894 Chronic pain syndrome: Secondary | ICD-10-CM | POA: Diagnosis not present

## 2019-02-26 DIAGNOSIS — M25562 Pain in left knee: Secondary | ICD-10-CM | POA: Diagnosis not present

## 2019-02-26 DIAGNOSIS — E6609 Other obesity due to excess calories: Secondary | ICD-10-CM | POA: Diagnosis not present

## 2019-02-26 DIAGNOSIS — Z6833 Body mass index (BMI) 33.0-33.9, adult: Secondary | ICD-10-CM | POA: Diagnosis not present

## 2019-03-02 ENCOUNTER — Telehealth: Payer: Self-pay | Admitting: Internal Medicine

## 2019-03-02 NOTE — Telephone Encounter (Signed)
[phone] PATIENT CALLED AND SAID THE OFFICE CALLED HER, PLEASE CALL BACK

## 2019-03-02 NOTE — Telephone Encounter (Signed)
tried calling pt. No VM set up.

## 2019-03-06 ENCOUNTER — Other Ambulatory Visit: Payer: Self-pay | Admitting: Gastroenterology

## 2019-03-06 NOTE — Telephone Encounter (Signed)
Letter mailed to pt. Haven't received a call back.

## 2019-03-26 DIAGNOSIS — G894 Chronic pain syndrome: Secondary | ICD-10-CM | POA: Diagnosis not present

## 2019-03-26 DIAGNOSIS — E6609 Other obesity due to excess calories: Secondary | ICD-10-CM | POA: Diagnosis not present

## 2019-03-26 DIAGNOSIS — Z6833 Body mass index (BMI) 33.0-33.9, adult: Secondary | ICD-10-CM | POA: Diagnosis not present

## 2019-03-26 DIAGNOSIS — Z23 Encounter for immunization: Secondary | ICD-10-CM | POA: Diagnosis not present

## 2019-03-30 ENCOUNTER — Telehealth: Payer: Self-pay | Admitting: *Deleted

## 2019-03-30 DIAGNOSIS — K838 Other specified diseases of biliary tract: Secondary | ICD-10-CM

## 2019-03-30 NOTE — Telephone Encounter (Signed)
PA for MRI/MRCP submitted via [employer]. Case approved. PA# [id number], valid 03/30/19-09/26/19.

## 2019-03-30 NOTE — Addendum Note (Signed)
Addended by: Zara Council C on: 03/30/2019 11:43 AM   Modules accepted: Orders

## 2019-03-30 NOTE — Telephone Encounter (Signed)
Pt called in this morning and was made aware that common bile duct is dilated, new finding since 2018 and [facility] recommends MR Abd/MRCP to evaluate.  Pt aware that we will call her to get this scheduled.  [phone]

## 2019-03-30 NOTE — Telephone Encounter (Signed)
MRI/MRCP scheduled for 04/09/19 at 11:00am, arrive at 10:30am. NPO 4 hours prior to test.  Called and informed pt of appt. Letter mailed.

## 2019-04-09 ENCOUNTER — Ambulatory Visit (HOSPITAL_COMMUNITY): Admission: RE | Admit: 2019-04-09 | Payer: Medicare HMO | Source: Ambulatory Visit

## 2019-04-16 ENCOUNTER — Ambulatory Visit (HOSPITAL_COMMUNITY): Admission: RE | Admit: 2019-04-16 | Payer: Medicare HMO | Source: Ambulatory Visit

## 2019-04-27 DIAGNOSIS — E6609 Other obesity due to excess calories: Secondary | ICD-10-CM | POA: Diagnosis not present

## 2019-04-27 DIAGNOSIS — Z6834 Body mass index (BMI) 34.0-34.9, adult: Secondary | ICD-10-CM | POA: Diagnosis not present

## 2019-04-27 DIAGNOSIS — G894 Chronic pain syndrome: Secondary | ICD-10-CM | POA: Diagnosis not present

## 2019-05-06 NOTE — Patient Instructions (Signed)
[name]  05/06/2019     @PREFPERIOPPHARMACY @   Your procedure is scheduled on  05/11/2019 .  Report to [facility] at  [place].  Call this number if you have problems the morning of surgery:  [phone]   Remember:  Follow the diet and prep instructions given to you by Dr Roseanne Kaufman office.                      Take these medicines the morning of surgery with A SIP OF WATER  Xanax(if needed), levothyroxine, reglan, metoprolol, prilosec, oxycodone(if needed), risperdal, effexor.    Do not wear jewelry, make-up or nail polish.  Do not wear lotions, powders, or perfumes. Please wear deodorant and brush your teeth.  Do not shave 48 hours prior to surgery.  Men may shave face and neck.  Do not bring valuables to the hospital.  [facility] is not responsible for any belongings or valuables.  Contacts, dentures or bridgework may not be worn into surgery.  Leave your suitcase in the car.  After surgery it may be brought to your room.  For patients admitted to the hospital, discharge time will be determined by your treatment team.  Patients discharged the day of surgery will not be allowed to drive home.   Name and phone number of your driver:   family Special instructions:  None  Please read over the following fact sheets that you were given. Anesthesia Post-op Instructions and Care and Recovery After Surgery       Upper Endoscopy, Adult, Care After This sheet gives you information about how to care for yourself after your procedure. Your health care provider may also give you more specific instructions. If you have problems or questions, contact your health care provider. What can I expect after the procedure? After the procedure, it is common to have:  A sore throat.  Mild stomach pain or discomfort.  Bloating.  Nausea. Follow these instructions at home:   Follow instructions from your health care provider about what to eat or drink after your  procedure.  Return to your normal activities as told by your health care provider. Ask your health care provider what activities are safe for you.  Take over-the-counter and prescription medicines only as told by your health care provider.  Do not drive for 24 hours if you were given a sedative during your procedure.  Keep all follow-up visits as told by your health care provider. This is important. Contact a health care provider if you have:  A sore throat that lasts longer than one day.  Trouble swallowing. Get help right away if:  You vomit blood or your vomit looks like coffee grounds.  You have: ? A fever. ? Bloody, black, or tarry stools. ? A severe sore throat or you cannot swallow. ? Difficulty breathing. ? Severe pain in your chest or abdomen. Summary  After the procedure, it is common to have a sore throat, mild stomach discomfort, bloating, and nausea.  Do not drive for 24 hours if you were given a sedative during the procedure.  Follow instructions from your health care provider about what to eat or drink after your procedure.  Return to your normal activities as told by your health care provider. This information is not intended to replace advice given to you by your health care provider. Make sure you discuss any questions you have with your  health care provider. Document Released: 11/20/2011 Document Revised: 11/12/2017 Document Reviewed: 10/21/2017 Elsevier Patient Education  2020 [place].  Esophageal Dilatation Esophageal dilatation, also called esophageal dilation, is a procedure to widen or open (dilate) a blocked or narrowed part of the esophagus. The esophagus is the part of the body that moves food and liquid from the mouth to the stomach. You may need this procedure if:  You have a buildup of scar tissue in your esophagus that makes it difficult, painful, or impossible to swallow. This can be caused by gastroesophageal reflux disease (GERD).  You  have cancer of the esophagus.  There is a problem with how food moves through your esophagus. In some cases, you may need this procedure repeated at a later time to dilate the esophagus gradually. Tell a health care provider about:  Any allergies you have.  All medicines you are taking, including vitamins, herbs, eye drops, creams, and over-the-counter medicines.  Any problems you or family members have had with anesthetic medicines.  Any blood disorders you have.  Any surgeries you have had.  Any medical conditions you have.  Any antibiotic medicines you are required to take before dental procedures.  Whether you are pregnant or may be pregnant. What are the risks? Generally, this is a safe procedure. However, problems may occur, including:  Bleeding due to a tear in the lining of the esophagus.  A hole (perforation) in the esophagus. What happens before the procedure?  Follow instructions from your health care provider about eating or drinking restrictions.  Ask your health care provider about changing or stopping your regular medicines. This is especially important if you are taking diabetes medicines or blood thinners.  Plan to have someone take you home from the hospital or clinic.  Plan to have a responsible adult care for you for at least 24 hours after you leave the hospital or clinic. This is important. What happens during the procedure?  You may be given a medicine to help you relax (sedative).  A numbing medicine may be sprayed into the back of your throat, or you may gargle the medicine.  Your health care provider may perform the dilatation using various surgical instruments, such as: ? Simple dilators. This instrument is carefully placed in the esophagus to stretch it. ? Guided wire bougies. This involves using an endoscope to insert a wire into the esophagus. A dilator is passed over this wire to enlarge the esophagus. Then the wire is removed. ? Balloon  dilators. An endoscope with a small balloon at the end is inserted into the esophagus. The balloon is inflated to stretch the esophagus and open it up. The procedure may vary among health care providers and hospitals. What happens after the procedure?  Your blood pressure, heart rate, breathing rate, and blood oxygen level will be monitored until the medicines you were given have worn off.  Your throat may feel slightly sore and numb. This will improve slowly over time.  You will not be allowed to eat or drink until your throat is no longer numb.  When you are able to drink, urinate, and sit on the edge of the bed without nausea or dizziness, you may be able to return home. Follow these instructions at home:  Take over-the-counter and prescription medicines only as told by your health care provider.  Do not drive for 24 hours if you were given a sedative during your procedure.  You should have a responsible adult with you for 24  hours after the procedure.  Follow instructions from your health care provider about any eating or drinking restrictions.  Do not use any products that contain nicotine or tobacco, such as cigarettes and e-cigarettes. If you need help quitting, ask your health care provider.  Keep all follow-up visits as told by your health care provider. This is important. Get help right away if you:  Have a fever.  Have chest pain.  Have pain that is not relieved by medication.  Have trouble breathing.  Have trouble swallowing.  Vomit blood. Summary  Esophageal dilatation, also called esophageal dilation, is a procedure to widen or open (dilate) a blocked or narrowed part of the esophagus.  Plan to have someone take you home from the hospital or clinic.  For this procedure, a numbing medicine may be sprayed into the back of your throat, or you may gargle the medicine.  Do not drive for 24 hours if you were given a sedative during your procedure. This  information is not intended to replace advice given to you by your health care provider. Make sure you discuss any questions you have with your health care provider. Document Released: 07/12/2005 Document Revised: 05/03/2017 Document Reviewed: 03/26/2017 Elsevier Patient Education  2020 [employer].  Colonoscopy, Adult, Care After This sheet gives you information about how to care for yourself after your procedure. Your health care provider may also give you more specific instructions. If you have problems or questions, contact your health care provider. What can I expect after the procedure? After the procedure, it is common to have:  A small amount of blood in your stool for 24 hours after the procedure.  Some gas.  Mild abdominal cramping or bloating. Follow these instructions at home: General instructions  For the first 24 hours after the procedure: ? Do not drive or use machinery. ? Do not sign important documents. ? Do not drink alcohol. ? Do your regular daily activities at a slower pace than normal. ? Eat soft, easy-to-digest foods.  Take over-the-counter or prescription medicines only as told by your health care provider. Relieving cramping and bloating   Try walking around when you have cramps or feel bloated.  Apply heat to your abdomen as told by your health care provider. Use a heat source that your health care provider recommends, such as a moist heat pack or a heating pad. ? Place a towel between your skin and the heat source. ? Leave the heat on for 20-30 minutes. ? Remove the heat if your skin turns bright red. This is especially important if you are unable to feel pain, heat, or cold. You may have a greater risk of getting burned. Eating and drinking   Drink enough fluid to keep your urine pale yellow.  Resume your normal diet as instructed by your health care provider. Avoid heavy or fried foods that are hard to digest.  Avoid drinking alcohol for as long  as instructed by your health care provider. Contact a health care provider if:  You have blood in your stool 2-3 days after the procedure. Get help right away if:  You have more than a small spotting of blood in your stool.  You pass large blood clots in your stool.  Your abdomen is swollen.  You have nausea or vomiting.  You have a fever.  You have increasing abdominal pain that is not relieved with medicine. Summary  After the procedure, it is common to have a small amount of blood in  your stool. You may also have mild abdominal cramping and bloating.  For the first 24 hours after the procedure, do not drive or use machinery, sign important documents, or drink alcohol.  Contact your health care provider if you have a lot of blood in your stool, nausea or vomiting, a fever, or increased abdominal pain. This information is not intended to replace advice given to you by your health care provider. Make sure you discuss any questions you have with your health care provider. Document Released: 01/03/2004 Document Revised: 03/13/2017 Document Reviewed: 08/02/2015 Elsevier Patient Education  2020 [place] After These instructions provide you with information about caring for yourself after your procedure. Your health care provider may also give you more specific instructions. Your treatment has been planned according to current medical practices, but problems sometimes occur. Call your health care provider if you have any problems or questions after your procedure. What can I expect after the procedure? After your procedure, you may:  Feel sleepy for several hours.  Feel clumsy and have poor balance for several hours.  Feel forgetful about what happened after the procedure.  Have poor judgment for several hours.  Feel nauseous or vomit.  Have a sore throat if you had a breathing tube during the procedure. Follow these instructions at home:  For at least 24 hours after the procedure:      Have a responsible adult stay with you. It is important to have someone help care for you until you are awake and alert.  Rest as needed.  Do not: ? Participate in activities in which you could fall or become injured. ? Drive. ? Use heavy machinery. ? Drink alcohol. ? Take sleeping pills or medicines that cause drowsiness. ? Make important decisions or sign legal documents. ? Take care of children on your own. Eating and drinking  Follow the diet that is recommended by your health care provider.  If you vomit, drink water, juice, or soup when you can drink without vomiting.  Make sure you have little or no nausea before eating solid foods. General instructions  Take over-the-counter and prescription medicines only as told by your health care provider.  If you have sleep apnea, surgery and certain medicines can increase your risk for breathing problems. Follow instructions from your health care provider about wearing your sleep device: ? Anytime you are sleeping, including during daytime naps. ? While taking prescription pain medicines, sleeping medicines, or medicines that make you drowsy.  If you smoke, do not smoke without supervision.  Keep all follow-up visits as told by your health care provider. This is important. Contact a health care provider if:  You keep feeling nauseous or you keep vomiting.  You feel light-headed.  You develop a rash.  You have a fever. Get help right away if:  You have trouble breathing. Summary  For several hours after your procedure, you may feel sleepy and have poor judgment.  Have a responsible adult stay with you for at least 24 hours or until you are awake and alert. This information is not intended to replace advice given to you by your health care provider. Make sure you discuss any questions you have with your health care provider. Document Released: 09/11/2015 Document  Revised: 08/19/2017 Document Reviewed: 09/11/2015 Elsevier Patient Education  2020 [employer].

## 2019-05-08 ENCOUNTER — Other Ambulatory Visit: Payer: Self-pay | Admitting: Gastroenterology

## 2019-05-08 ENCOUNTER — Other Ambulatory Visit (HOSPITAL_COMMUNITY)
Admission: RE | Admit: 2019-05-08 | Discharge: 2019-05-08 | Disposition: A | Discharge status: Home / Self Care | Payer: Medicare HMO | Source: Ambulatory Visit | Attending: Internal Medicine | Admitting: Internal Medicine

## 2019-05-08 ENCOUNTER — Encounter (HOSPITAL_COMMUNITY): Payer: Self-pay

## 2019-05-08 ENCOUNTER — Telehealth: Payer: Self-pay | Admitting: *Deleted

## 2019-05-08 ENCOUNTER — Other Ambulatory Visit: Payer: Self-pay

## 2019-05-08 ENCOUNTER — Encounter (HOSPITAL_COMMUNITY)
Admission: RE | Admit: 2019-05-08 | Discharge: 2019-05-08 | Disposition: A | Discharge status: Home / Self Care | Payer: Medicare HMO | Source: Ambulatory Visit | Attending: Internal Medicine | Admitting: Internal Medicine

## 2019-05-08 NOTE — Telephone Encounter (Signed)
Called pt again and line rang numerous times, no answer and no VM  Called endo and patient has not called them and have not been able to reach her. Procedure for Monday has been cancelled. FYI to LSL.

## 2019-05-08 NOTE — Telephone Encounter (Signed)
MRCP has been cancelled x 1, then no showed x 1 and patient has not rescheduled

## 2019-05-08 NOTE — Telephone Encounter (Signed)
Noted. Multiple episodes of noncompliance. She has not had her MRI Abd/MRCP either. Has OV in 08/2019.

## 2019-05-08 NOTE — Telephone Encounter (Signed)
Called pt, line rang numerous times, no answer and no VM

## 2019-05-08 NOTE — Telephone Encounter (Signed)
-----   Message from Encarnacion Chu, RN sent at 05/08/2019  8:27 AM EST ----- Regarding: No show Good Friday morning! Johny Drilling did not show for her PAT this morning.

## 2019-05-11 ENCOUNTER — Encounter (HOSPITAL_COMMUNITY): Admission: RE | Payer: Self-pay | Source: Home / Self Care

## 2019-05-11 ENCOUNTER — Ambulatory Visit (HOSPITAL_COMMUNITY): Admission: RE | Admit: 2019-05-11 | Payer: Medicare HMO | Source: Home / Self Care | Admitting: Internal Medicine

## 2019-05-11 SURGERY — COLONOSCOPY WITH PROPOFOL
Anesthesia: Monitor Anesthesia Care

## 2019-05-25 DIAGNOSIS — Z6834 Body mass index (BMI) 34.0-34.9, adult: Secondary | ICD-10-CM | POA: Diagnosis not present

## 2019-05-25 DIAGNOSIS — E6609 Other obesity due to excess calories: Secondary | ICD-10-CM | POA: Diagnosis not present

## 2019-05-25 DIAGNOSIS — E559 Vitamin D deficiency, unspecified: Secondary | ICD-10-CM | POA: Diagnosis not present

## 2019-05-25 DIAGNOSIS — Z1389 Encounter for screening for other disorder: Secondary | ICD-10-CM | POA: Diagnosis not present

## 2019-05-25 DIAGNOSIS — E063 Autoimmune thyroiditis: Secondary | ICD-10-CM | POA: Diagnosis not present

## 2019-05-25 DIAGNOSIS — E538 Deficiency of other specified B group vitamins: Secondary | ICD-10-CM | POA: Diagnosis not present

## 2019-05-25 DIAGNOSIS — R69 Illness, unspecified: Secondary | ICD-10-CM | POA: Diagnosis not present

## 2019-05-25 DIAGNOSIS — Z0000 Encounter for general adult medical examination without abnormal findings: Secondary | ICD-10-CM | POA: Diagnosis not present

## 2019-05-25 DIAGNOSIS — G894 Chronic pain syndrome: Secondary | ICD-10-CM | POA: Diagnosis not present

## 2019-05-25 DIAGNOSIS — M5136 Other intervertebral disc degeneration, lumbar region: Secondary | ICD-10-CM | POA: Diagnosis not present

## 2019-06-08 ENCOUNTER — Other Ambulatory Visit: Payer: Self-pay | Admitting: Internal Medicine

## 2019-06-09 NOTE — Telephone Encounter (Signed)
This is a [place] pt.  °

## 2019-06-25 DIAGNOSIS — R69 Illness, unspecified: Secondary | ICD-10-CM | POA: Diagnosis not present

## 2019-06-25 DIAGNOSIS — G894 Chronic pain syndrome: Secondary | ICD-10-CM | POA: Diagnosis not present

## 2019-06-25 DIAGNOSIS — Z6835 Body mass index (BMI) 35.0-35.9, adult: Secondary | ICD-10-CM | POA: Diagnosis not present

## 2019-07-15 DIAGNOSIS — R69 Illness, unspecified: Secondary | ICD-10-CM | POA: Diagnosis not present

## 2019-07-27 DIAGNOSIS — M5136 Other intervertebral disc degeneration, lumbar region: Secondary | ICD-10-CM | POA: Diagnosis not present

## 2019-07-27 DIAGNOSIS — G894 Chronic pain syndrome: Secondary | ICD-10-CM | POA: Diagnosis not present

## 2019-07-27 DIAGNOSIS — K7460 Unspecified cirrhosis of liver: Secondary | ICD-10-CM | POA: Diagnosis not present

## 2019-07-27 DIAGNOSIS — Z6832 Body mass index (BMI) 32.0-32.9, adult: Secondary | ICD-10-CM | POA: Diagnosis not present

## 2019-07-27 DIAGNOSIS — E6609 Other obesity due to excess calories: Secondary | ICD-10-CM | POA: Diagnosis not present

## 2019-07-27 DIAGNOSIS — Z1389 Encounter for screening for other disorder: Secondary | ICD-10-CM | POA: Diagnosis not present

## 2019-08-24 ENCOUNTER — Telehealth: Payer: Self-pay | Admitting: Internal Medicine

## 2019-08-24 ENCOUNTER — Ambulatory Visit: Payer: Medicare HMO | Admitting: Gastroenterology

## 2019-08-24 ENCOUNTER — Encounter: Payer: Self-pay | Admitting: Internal Medicine

## 2019-08-24 DIAGNOSIS — G894 Chronic pain syndrome: Secondary | ICD-10-CM | POA: Diagnosis not present

## 2019-08-24 DIAGNOSIS — Z6834 Body mass index (BMI) 34.0-34.9, adult: Secondary | ICD-10-CM | POA: Diagnosis not present

## 2019-08-24 DIAGNOSIS — E6609 Other obesity due to excess calories: Secondary | ICD-10-CM | POA: Diagnosis not present

## 2019-08-24 NOTE — Telephone Encounter (Signed)
Patient was a no show and letter sent  °

## 2019-09-02 DIAGNOSIS — M1991 Primary osteoarthritis, unspecified site: Secondary | ICD-10-CM | POA: Diagnosis not present

## 2019-09-02 DIAGNOSIS — I10 Essential (primary) hypertension: Secondary | ICD-10-CM | POA: Diagnosis not present

## 2019-09-02 DIAGNOSIS — M5136 Other intervertebral disc degeneration, lumbar region: Secondary | ICD-10-CM | POA: Diagnosis not present

## 2019-09-02 DIAGNOSIS — R69 Illness, unspecified: Secondary | ICD-10-CM | POA: Diagnosis not present

## 2019-09-02 DIAGNOSIS — E7849 Other hyperlipidemia: Secondary | ICD-10-CM | POA: Diagnosis not present

## 2019-09-23 DIAGNOSIS — R69 Illness, unspecified: Secondary | ICD-10-CM | POA: Diagnosis not present

## 2019-09-23 DIAGNOSIS — G894 Chronic pain syndrome: Secondary | ICD-10-CM | POA: Diagnosis not present

## 2019-09-23 DIAGNOSIS — Z6834 Body mass index (BMI) 34.0-34.9, adult: Secondary | ICD-10-CM | POA: Diagnosis not present

## 2019-09-23 DIAGNOSIS — E6609 Other obesity due to excess calories: Secondary | ICD-10-CM | POA: Diagnosis not present

## 2019-10-02 DIAGNOSIS — I10 Essential (primary) hypertension: Secondary | ICD-10-CM | POA: Diagnosis not present

## 2019-10-02 DIAGNOSIS — E7849 Other hyperlipidemia: Secondary | ICD-10-CM | POA: Diagnosis not present

## 2019-10-02 DIAGNOSIS — M1991 Primary osteoarthritis, unspecified site: Secondary | ICD-10-CM | POA: Diagnosis not present

## 2019-10-02 DIAGNOSIS — R69 Illness, unspecified: Secondary | ICD-10-CM | POA: Diagnosis not present

## 2019-10-22 DIAGNOSIS — Z6833 Body mass index (BMI) 33.0-33.9, adult: Secondary | ICD-10-CM | POA: Diagnosis not present

## 2019-10-22 DIAGNOSIS — R69 Illness, unspecified: Secondary | ICD-10-CM | POA: Diagnosis not present

## 2019-10-22 DIAGNOSIS — G894 Chronic pain syndrome: Secondary | ICD-10-CM | POA: Diagnosis not present

## 2019-10-22 DIAGNOSIS — E6609 Other obesity due to excess calories: Secondary | ICD-10-CM | POA: Diagnosis not present

## 2019-11-02 DIAGNOSIS — R69 Illness, unspecified: Secondary | ICD-10-CM | POA: Diagnosis not present

## 2019-11-02 DIAGNOSIS — E7849 Other hyperlipidemia: Secondary | ICD-10-CM | POA: Diagnosis not present

## 2019-11-02 DIAGNOSIS — M1991 Primary osteoarthritis, unspecified site: Secondary | ICD-10-CM | POA: Diagnosis not present

## 2019-11-02 DIAGNOSIS — I10 Essential (primary) hypertension: Secondary | ICD-10-CM | POA: Diagnosis not present

## 2019-11-23 DIAGNOSIS — Z6832 Body mass index (BMI) 32.0-32.9, adult: Secondary | ICD-10-CM | POA: Diagnosis not present

## 2019-11-23 DIAGNOSIS — G894 Chronic pain syndrome: Secondary | ICD-10-CM | POA: Diagnosis not present

## 2019-11-23 DIAGNOSIS — E6609 Other obesity due to excess calories: Secondary | ICD-10-CM | POA: Diagnosis not present

## 2019-11-23 DIAGNOSIS — R69 Illness, unspecified: Secondary | ICD-10-CM | POA: Diagnosis not present

## 2019-12-02 DIAGNOSIS — M1991 Primary osteoarthritis, unspecified site: Secondary | ICD-10-CM | POA: Diagnosis not present

## 2019-12-02 DIAGNOSIS — E7849 Other hyperlipidemia: Secondary | ICD-10-CM | POA: Diagnosis not present

## 2019-12-02 DIAGNOSIS — R69 Illness, unspecified: Secondary | ICD-10-CM | POA: Diagnosis not present

## 2019-12-02 DIAGNOSIS — I10 Essential (primary) hypertension: Secondary | ICD-10-CM | POA: Diagnosis not present

## 2019-12-16 ENCOUNTER — Other Ambulatory Visit: Payer: Self-pay | Admitting: Internal Medicine

## 2019-12-23 DIAGNOSIS — M5136 Other intervertebral disc degeneration, lumbar region: Secondary | ICD-10-CM | POA: Diagnosis not present

## 2019-12-23 DIAGNOSIS — M1991 Primary osteoarthritis, unspecified site: Secondary | ICD-10-CM | POA: Diagnosis not present

## 2019-12-23 DIAGNOSIS — G894 Chronic pain syndrome: Secondary | ICD-10-CM | POA: Diagnosis not present

## 2019-12-23 DIAGNOSIS — I10 Essential (primary) hypertension: Secondary | ICD-10-CM | POA: Diagnosis not present

## 2019-12-23 DIAGNOSIS — Z6832 Body mass index (BMI) 32.0-32.9, adult: Secondary | ICD-10-CM | POA: Diagnosis not present

## 2019-12-23 DIAGNOSIS — R110 Nausea: Secondary | ICD-10-CM | POA: Diagnosis not present

## 2020-01-01 DIAGNOSIS — M1991 Primary osteoarthritis, unspecified site: Secondary | ICD-10-CM | POA: Diagnosis not present

## 2020-01-01 DIAGNOSIS — R69 Illness, unspecified: Secondary | ICD-10-CM | POA: Diagnosis not present

## 2020-01-01 DIAGNOSIS — E7849 Other hyperlipidemia: Secondary | ICD-10-CM | POA: Diagnosis not present

## 2020-01-01 DIAGNOSIS — I10 Essential (primary) hypertension: Secondary | ICD-10-CM | POA: Diagnosis not present

## 2020-01-20 DIAGNOSIS — Z6832 Body mass index (BMI) 32.0-32.9, adult: Secondary | ICD-10-CM | POA: Diagnosis not present

## 2020-01-20 DIAGNOSIS — M1991 Primary osteoarthritis, unspecified site: Secondary | ICD-10-CM | POA: Diagnosis not present

## 2020-01-20 DIAGNOSIS — R69 Illness, unspecified: Secondary | ICD-10-CM | POA: Diagnosis not present

## 2020-01-20 DIAGNOSIS — M5136 Other intervertebral disc degeneration, lumbar region: Secondary | ICD-10-CM | POA: Diagnosis not present

## 2020-01-20 DIAGNOSIS — E6609 Other obesity due to excess calories: Secondary | ICD-10-CM | POA: Diagnosis not present

## 2020-01-20 DIAGNOSIS — G894 Chronic pain syndrome: Secondary | ICD-10-CM | POA: Diagnosis not present

## 2020-02-22 DIAGNOSIS — G894 Chronic pain syndrome: Secondary | ICD-10-CM | POA: Diagnosis not present

## 2020-02-22 DIAGNOSIS — E6609 Other obesity due to excess calories: Secondary | ICD-10-CM | POA: Diagnosis not present

## 2020-02-22 DIAGNOSIS — Z6832 Body mass index (BMI) 32.0-32.9, adult: Secondary | ICD-10-CM | POA: Diagnosis not present

## 2020-02-22 DIAGNOSIS — M533 Sacrococcygeal disorders, not elsewhere classified: Secondary | ICD-10-CM | POA: Diagnosis not present

## 2020-03-23 DIAGNOSIS — R69 Illness, unspecified: Secondary | ICD-10-CM | POA: Diagnosis not present

## 2020-03-23 DIAGNOSIS — G894 Chronic pain syndrome: Secondary | ICD-10-CM | POA: Diagnosis not present

## 2020-03-23 DIAGNOSIS — M5136 Other intervertebral disc degeneration, lumbar region: Secondary | ICD-10-CM | POA: Diagnosis not present

## 2020-03-23 DIAGNOSIS — Z6832 Body mass index (BMI) 32.0-32.9, adult: Secondary | ICD-10-CM | POA: Diagnosis not present

## 2020-04-21 DIAGNOSIS — J22 Unspecified acute lower respiratory infection: Secondary | ICD-10-CM | POA: Diagnosis not present

## 2020-04-21 DIAGNOSIS — G894 Chronic pain syndrome: Secondary | ICD-10-CM | POA: Diagnosis not present

## 2020-04-21 DIAGNOSIS — E538 Deficiency of other specified B group vitamins: Secondary | ICD-10-CM | POA: Diagnosis not present

## 2020-04-21 DIAGNOSIS — Z6831 Body mass index (BMI) 31.0-31.9, adult: Secondary | ICD-10-CM | POA: Diagnosis not present

## 2020-05-19 DIAGNOSIS — G894 Chronic pain syndrome: Secondary | ICD-10-CM | POA: Diagnosis not present

## 2020-05-19 DIAGNOSIS — M5136 Other intervertebral disc degeneration, lumbar region: Secondary | ICD-10-CM | POA: Diagnosis not present

## 2020-05-19 DIAGNOSIS — Z6832 Body mass index (BMI) 32.0-32.9, adult: Secondary | ICD-10-CM | POA: Diagnosis not present

## 2020-05-19 DIAGNOSIS — E6609 Other obesity due to excess calories: Secondary | ICD-10-CM | POA: Diagnosis not present

## 2020-06-16 DIAGNOSIS — K589 Irritable bowel syndrome without diarrhea: Secondary | ICD-10-CM | POA: Diagnosis not present

## 2020-06-16 DIAGNOSIS — Z1331 Encounter for screening for depression: Secondary | ICD-10-CM | POA: Diagnosis not present

## 2020-06-16 DIAGNOSIS — Z0000 Encounter for general adult medical examination without abnormal findings: Secondary | ICD-10-CM | POA: Diagnosis not present

## 2020-06-16 DIAGNOSIS — G894 Chronic pain syndrome: Secondary | ICD-10-CM | POA: Diagnosis not present

## 2020-06-16 DIAGNOSIS — G43009 Migraine without aura, not intractable, without status migrainosus: Secondary | ICD-10-CM | POA: Diagnosis not present

## 2020-06-16 DIAGNOSIS — K7460 Unspecified cirrhosis of liver: Secondary | ICD-10-CM | POA: Diagnosis not present

## 2020-06-16 DIAGNOSIS — I10 Essential (primary) hypertension: Secondary | ICD-10-CM | POA: Diagnosis not present

## 2020-06-16 DIAGNOSIS — Z6831 Body mass index (BMI) 31.0-31.9, adult: Secondary | ICD-10-CM | POA: Diagnosis not present

## 2020-07-18 DIAGNOSIS — G894 Chronic pain syndrome: Secondary | ICD-10-CM | POA: Diagnosis not present

## 2020-07-18 DIAGNOSIS — E6609 Other obesity due to excess calories: Secondary | ICD-10-CM | POA: Diagnosis not present

## 2020-07-18 DIAGNOSIS — Z6832 Body mass index (BMI) 32.0-32.9, adult: Secondary | ICD-10-CM | POA: Diagnosis not present

## 2020-07-18 DIAGNOSIS — R69 Illness, unspecified: Secondary | ICD-10-CM | POA: Diagnosis not present

## 2020-08-11 DIAGNOSIS — E538 Deficiency of other specified B group vitamins: Secondary | ICD-10-CM | POA: Diagnosis not present

## 2020-08-11 DIAGNOSIS — K219 Gastro-esophageal reflux disease without esophagitis: Secondary | ICD-10-CM | POA: Diagnosis not present

## 2020-08-11 DIAGNOSIS — Z6833 Body mass index (BMI) 33.0-33.9, adult: Secondary | ICD-10-CM | POA: Diagnosis not present

## 2020-08-11 DIAGNOSIS — R69 Illness, unspecified: Secondary | ICD-10-CM | POA: Diagnosis not present

## 2020-08-11 DIAGNOSIS — M5136 Other intervertebral disc degeneration, lumbar region: Secondary | ICD-10-CM | POA: Diagnosis not present

## 2020-09-12 DIAGNOSIS — E6609 Other obesity due to excess calories: Secondary | ICD-10-CM | POA: Diagnosis not present

## 2020-09-12 DIAGNOSIS — G894 Chronic pain syndrome: Secondary | ICD-10-CM | POA: Diagnosis not present

## 2020-09-12 DIAGNOSIS — J22 Unspecified acute lower respiratory infection: Secondary | ICD-10-CM | POA: Diagnosis not present

## 2020-09-12 DIAGNOSIS — M5136 Other intervertebral disc degeneration, lumbar region: Secondary | ICD-10-CM | POA: Diagnosis not present

## 2020-09-12 DIAGNOSIS — Z6834 Body mass index (BMI) 34.0-34.9, adult: Secondary | ICD-10-CM | POA: Diagnosis not present

## 2020-10-12 DIAGNOSIS — R630 Anorexia: Secondary | ICD-10-CM | POA: Diagnosis not present

## 2020-10-12 DIAGNOSIS — E6609 Other obesity due to excess calories: Secondary | ICD-10-CM | POA: Diagnosis not present

## 2020-10-12 DIAGNOSIS — G894 Chronic pain syndrome: Secondary | ICD-10-CM | POA: Diagnosis not present

## 2020-10-12 DIAGNOSIS — M5136 Other intervertebral disc degeneration, lumbar region: Secondary | ICD-10-CM | POA: Diagnosis not present

## 2020-10-12 DIAGNOSIS — K7460 Unspecified cirrhosis of liver: Secondary | ICD-10-CM | POA: Diagnosis not present

## 2020-10-12 DIAGNOSIS — E538 Deficiency of other specified B group vitamins: Secondary | ICD-10-CM | POA: Diagnosis not present

## 2020-10-12 DIAGNOSIS — Z6833 Body mass index (BMI) 33.0-33.9, adult: Secondary | ICD-10-CM | POA: Diagnosis not present

## 2020-10-12 DIAGNOSIS — R110 Nausea: Secondary | ICD-10-CM | POA: Diagnosis not present

## 2020-10-12 DIAGNOSIS — R945 Abnormal results of liver function studies: Secondary | ICD-10-CM | POA: Diagnosis not present

## 2020-10-18 DIAGNOSIS — R945 Abnormal results of liver function studies: Secondary | ICD-10-CM | POA: Diagnosis not present

## 2020-10-18 DIAGNOSIS — R630 Anorexia: Secondary | ICD-10-CM | POA: Diagnosis not present

## 2020-10-18 DIAGNOSIS — R5383 Other fatigue: Secondary | ICD-10-CM | POA: Diagnosis not present

## 2020-10-18 DIAGNOSIS — Z6833 Body mass index (BMI) 33.0-33.9, adult: Secondary | ICD-10-CM | POA: Diagnosis not present

## 2020-10-20 ENCOUNTER — Other Ambulatory Visit (HOSPITAL_COMMUNITY): Payer: Self-pay | Admitting: Physician Assistant

## 2020-10-20 ENCOUNTER — Other Ambulatory Visit: Payer: Self-pay | Admitting: Physician Assistant

## 2020-10-20 DIAGNOSIS — M5136 Other intervertebral disc degeneration, lumbar region: Secondary | ICD-10-CM

## 2020-11-07 ENCOUNTER — Ambulatory Visit (HOSPITAL_COMMUNITY): Payer: Medicare HMO

## 2020-11-09 DIAGNOSIS — Z681 Body mass index (BMI) 19 or less, adult: Secondary | ICD-10-CM | POA: Diagnosis not present

## 2020-11-09 DIAGNOSIS — M5136 Other intervertebral disc degeneration, lumbar region: Secondary | ICD-10-CM | POA: Diagnosis not present

## 2020-11-09 DIAGNOSIS — R5383 Other fatigue: Secondary | ICD-10-CM | POA: Diagnosis not present

## 2020-11-09 DIAGNOSIS — E538 Deficiency of other specified B group vitamins: Secondary | ICD-10-CM | POA: Diagnosis not present

## 2020-11-09 DIAGNOSIS — G894 Chronic pain syndrome: Secondary | ICD-10-CM | POA: Diagnosis not present

## 2020-11-15 ENCOUNTER — Ambulatory Visit (HOSPITAL_COMMUNITY): Payer: Medicare HMO | Attending: Physician Assistant

## 2020-11-15 ENCOUNTER — Encounter (HOSPITAL_COMMUNITY): Payer: Self-pay

## 2020-12-12 DIAGNOSIS — G894 Chronic pain syndrome: Secondary | ICD-10-CM | POA: Diagnosis not present

## 2020-12-12 DIAGNOSIS — K7460 Unspecified cirrhosis of liver: Secondary | ICD-10-CM | POA: Diagnosis not present

## 2020-12-12 DIAGNOSIS — E6609 Other obesity due to excess calories: Secondary | ICD-10-CM | POA: Diagnosis not present

## 2020-12-12 DIAGNOSIS — Z6836 Body mass index (BMI) 36.0-36.9, adult: Secondary | ICD-10-CM | POA: Diagnosis not present

## 2020-12-12 DIAGNOSIS — M5136 Other intervertebral disc degeneration, lumbar region: Secondary | ICD-10-CM | POA: Diagnosis not present

## 2020-12-12 DIAGNOSIS — E538 Deficiency of other specified B group vitamins: Secondary | ICD-10-CM | POA: Diagnosis not present

## 2020-12-12 DIAGNOSIS — F329 Major depressive disorder, single episode, unspecified: Secondary | ICD-10-CM | POA: Diagnosis not present

## 2020-12-12 DIAGNOSIS — R69 Illness, unspecified: Secondary | ICD-10-CM | POA: Diagnosis not present

## 2020-12-12 DIAGNOSIS — R188 Other ascites: Secondary | ICD-10-CM | POA: Diagnosis not present

## 2020-12-21 ENCOUNTER — Encounter: Payer: Self-pay | Admitting: Internal Medicine

## 2021-01-12 DIAGNOSIS — B359 Dermatophytosis, unspecified: Secondary | ICD-10-CM | POA: Diagnosis not present

## 2021-01-12 DIAGNOSIS — E538 Deficiency of other specified B group vitamins: Secondary | ICD-10-CM | POA: Diagnosis not present

## 2021-01-12 DIAGNOSIS — Z6835 Body mass index (BMI) 35.0-35.9, adult: Secondary | ICD-10-CM | POA: Diagnosis not present

## 2021-01-12 DIAGNOSIS — R0600 Dyspnea, unspecified: Secondary | ICD-10-CM | POA: Diagnosis not present

## 2021-01-12 DIAGNOSIS — E6609 Other obesity due to excess calories: Secondary | ICD-10-CM | POA: Diagnosis not present

## 2021-01-12 DIAGNOSIS — G894 Chronic pain syndrome: Secondary | ICD-10-CM | POA: Diagnosis not present

## 2021-02-09 ENCOUNTER — Encounter (HOSPITAL_COMMUNITY): Payer: Self-pay

## 2021-02-09 ENCOUNTER — Emergency Department (HOSPITAL_COMMUNITY): Payer: Medicare HMO

## 2021-02-09 ENCOUNTER — Other Ambulatory Visit: Payer: Self-pay

## 2021-02-09 ENCOUNTER — Inpatient Hospital Stay (HOSPITAL_COMMUNITY)
Admission: EM | Admit: 2021-02-09 | Discharge: 2021-02-13 | DRG: 305 | Disposition: A | Discharge status: Home / Self Care | Payer: Medicare HMO | Attending: Internal Medicine | Admitting: Internal Medicine

## 2021-02-09 DIAGNOSIS — E860 Dehydration: Secondary | ICD-10-CM

## 2021-02-09 DIAGNOSIS — E039 Hypothyroidism, unspecified: Secondary | ICD-10-CM | POA: Diagnosis present

## 2021-02-09 DIAGNOSIS — K219 Gastro-esophageal reflux disease without esophagitis: Secondary | ICD-10-CM | POA: Diagnosis present

## 2021-02-09 DIAGNOSIS — G8929 Other chronic pain: Secondary | ICD-10-CM | POA: Diagnosis present

## 2021-02-09 DIAGNOSIS — R519 Headache, unspecified: Secondary | ICD-10-CM

## 2021-02-09 DIAGNOSIS — D75839 Thrombocytosis, unspecified: Secondary | ICD-10-CM | POA: Diagnosis not present

## 2021-02-09 DIAGNOSIS — Z79899 Other long term (current) drug therapy: Secondary | ICD-10-CM

## 2021-02-09 DIAGNOSIS — I10 Essential (primary) hypertension: Secondary | ICD-10-CM | POA: Diagnosis present

## 2021-02-09 DIAGNOSIS — I169 Hypertensive crisis, unspecified: Secondary | ICD-10-CM | POA: Diagnosis present

## 2021-02-09 DIAGNOSIS — E8809 Other disorders of plasma-protein metabolism, not elsewhere classified: Secondary | ICD-10-CM | POA: Diagnosis not present

## 2021-02-09 DIAGNOSIS — R079 Chest pain, unspecified: Secondary | ICD-10-CM

## 2021-02-09 DIAGNOSIS — Z885 Allergy status to narcotic agent status: Secondary | ICD-10-CM

## 2021-02-09 DIAGNOSIS — Z800 Family history of malignant neoplasm of digestive organs: Secondary | ICD-10-CM

## 2021-02-09 DIAGNOSIS — H532 Diplopia: Secondary | ICD-10-CM | POA: Diagnosis present

## 2021-02-09 DIAGNOSIS — D72829 Elevated white blood cell count, unspecified: Secondary | ICD-10-CM | POA: Diagnosis not present

## 2021-02-09 DIAGNOSIS — E876 Hypokalemia: Secondary | ICD-10-CM

## 2021-02-09 DIAGNOSIS — Z886 Allergy status to analgesic agent status: Secondary | ICD-10-CM

## 2021-02-09 DIAGNOSIS — Z8051 Family history of malignant neoplasm of kidney: Secondary | ICD-10-CM

## 2021-02-09 DIAGNOSIS — I160 Hypertensive urgency: Secondary | ICD-10-CM | POA: Diagnosis not present

## 2021-02-09 DIAGNOSIS — F439 Reaction to severe stress, unspecified: Secondary | ICD-10-CM | POA: Diagnosis present

## 2021-02-09 DIAGNOSIS — R112 Nausea with vomiting, unspecified: Secondary | ICD-10-CM | POA: Diagnosis present

## 2021-02-09 DIAGNOSIS — R110 Nausea: Secondary | ICD-10-CM | POA: Diagnosis not present

## 2021-02-09 DIAGNOSIS — E46 Unspecified protein-calorie malnutrition: Secondary | ICD-10-CM

## 2021-02-09 DIAGNOSIS — Z86010 Personal history of colonic polyps: Secondary | ICD-10-CM

## 2021-02-09 DIAGNOSIS — K648 Other hemorrhoids: Secondary | ICD-10-CM | POA: Diagnosis present

## 2021-02-09 DIAGNOSIS — I248 Other forms of acute ischemic heart disease: Secondary | ICD-10-CM | POA: Diagnosis present

## 2021-02-09 DIAGNOSIS — M5136 Other intervertebral disc degeneration, lumbar region: Secondary | ICD-10-CM | POA: Diagnosis present

## 2021-02-09 DIAGNOSIS — J449 Chronic obstructive pulmonary disease, unspecified: Secondary | ICD-10-CM | POA: Diagnosis present

## 2021-02-09 DIAGNOSIS — R778 Other specified abnormalities of plasma proteins: Secondary | ICD-10-CM

## 2021-02-09 DIAGNOSIS — F32A Depression, unspecified: Secondary | ICD-10-CM | POA: Diagnosis present

## 2021-02-09 DIAGNOSIS — F419 Anxiety disorder, unspecified: Secondary | ICD-10-CM | POA: Diagnosis present

## 2021-02-09 DIAGNOSIS — R748 Abnormal levels of other serum enzymes: Secondary | ICD-10-CM

## 2021-02-09 DIAGNOSIS — G894 Chronic pain syndrome: Secondary | ICD-10-CM | POA: Diagnosis not present

## 2021-02-09 DIAGNOSIS — Z888 Allergy status to other drugs, medicaments and biological substances status: Secondary | ICD-10-CM

## 2021-02-09 DIAGNOSIS — Z720 Tobacco use: Secondary | ICD-10-CM

## 2021-02-09 DIAGNOSIS — F17210 Nicotine dependence, cigarettes, uncomplicated: Secondary | ICD-10-CM | POA: Diagnosis present

## 2021-02-09 DIAGNOSIS — Z20822 Contact with and (suspected) exposure to covid-19: Secondary | ICD-10-CM | POA: Diagnosis present

## 2021-02-09 DIAGNOSIS — Z681 Body mass index (BMI) 19 or less, adult: Secondary | ICD-10-CM | POA: Diagnosis not present

## 2021-02-09 DIAGNOSIS — E440 Moderate protein-calorie malnutrition: Secondary | ICD-10-CM | POA: Diagnosis present

## 2021-02-09 DIAGNOSIS — K7460 Unspecified cirrhosis of liver: Secondary | ICD-10-CM | POA: Diagnosis present

## 2021-02-09 DIAGNOSIS — Z91040 Latex allergy status: Secondary | ICD-10-CM

## 2021-02-09 DIAGNOSIS — K3184 Gastroparesis: Secondary | ICD-10-CM | POA: Diagnosis present

## 2021-02-09 DIAGNOSIS — N179 Acute kidney failure, unspecified: Secondary | ICD-10-CM | POA: Diagnosis present

## 2021-02-09 DIAGNOSIS — Z8719 Personal history of other diseases of the digestive system: Secondary | ICD-10-CM

## 2021-02-09 DIAGNOSIS — Z79890 Hormone replacement therapy (postmenopausal): Secondary | ICD-10-CM

## 2021-02-09 DIAGNOSIS — Z6831 Body mass index (BMI) 31.0-31.9, adult: Secondary | ICD-10-CM

## 2021-02-09 DIAGNOSIS — Z806 Family history of leukemia: Secondary | ICD-10-CM

## 2021-02-09 DIAGNOSIS — G43909 Migraine, unspecified, not intractable, without status migrainosus: Secondary | ICD-10-CM

## 2021-02-09 LAB — CBC WITH DIFFERENTIAL/PLATELET
Abs Immature Granulocytes: 0.16 10*3/uL — ABNORMAL HIGH (ref 0.00–0.07)
Basophils Absolute: 0.1 10*3/uL (ref 0.0–0.1)
Basophils Relative: 0 %
Eosinophils Absolute: 3.5 10*3/uL — ABNORMAL HIGH (ref 0.0–0.5)
Eosinophils Relative: 16 %
HCT: 45 % (ref 36.0–46.0)
Hemoglobin: 15.4 g/dL — ABNORMAL HIGH (ref 12.0–15.0)
Immature Granulocytes: 1 %
Lymphocytes Relative: 10 %
Lymphs Abs: 2.2 10*3/uL (ref 0.7–4.0)
MCH: 30.7 pg (ref 26.0–34.0)
MCHC: 34.2 g/dL (ref 30.0–36.0)
MCV: 89.6 fL (ref 80.0–100.0)
Monocytes Absolute: 1.6 10*3/uL — ABNORMAL HIGH (ref 0.1–1.0)
Monocytes Relative: 8 %
Neutro Abs: 14 10*3/uL — ABNORMAL HIGH (ref 1.7–7.7)
Neutrophils Relative %: 65 %
Platelets: 426 10*3/uL — ABNORMAL HIGH (ref 150–400)
RBC: 5.02 MIL/uL (ref 3.87–5.11)
RDW: 13.8 % (ref 11.5–15.5)
WBC: 21.6 10*3/uL — ABNORMAL HIGH (ref 4.0–10.5)
nRBC: 0 % (ref 0.0–0.2)

## 2021-02-09 LAB — COMPREHENSIVE METABOLIC PANEL
ALT: 17 U/L (ref 0–44)
AST: 33 U/L (ref 15–41)
Albumin: 2.6 g/dL — ABNORMAL LOW (ref 3.5–5.0)
Alkaline Phosphatase: 284 U/L — ABNORMAL HIGH (ref 38–126)
Anion gap: 8 (ref 5–15)
BUN: 21 mg/dL — ABNORMAL HIGH (ref 6–20)
CO2: 22 mmol/L (ref 22–32)
Calcium: 8.6 mg/dL — ABNORMAL LOW (ref 8.9–10.3)
Chloride: 109 mmol/L (ref 98–111)
Creatinine, Ser: 1.09 mg/dL — ABNORMAL HIGH (ref 0.44–1.00)
GFR, Estimated: 59 mL/min — ABNORMAL LOW (ref >60)
Glucose, Bld: 137 mg/dL — ABNORMAL HIGH (ref 70–99)
Potassium: 3.3 mmol/L — ABNORMAL LOW (ref 3.5–5.1)
Sodium: 139 mmol/L (ref 135–145)
Total Bilirubin: 1.1 mg/dL (ref 0.3–1.2)
Total Protein: 6.2 g/dL — ABNORMAL LOW (ref 6.5–8.1)

## 2021-02-09 LAB — LIPASE, BLOOD: Lipase: 28 U/L (ref 11–51)

## 2021-02-09 MED ORDER — ONDANSETRON HCL 4 MG/2ML IJ SOLN
4.0000 mg | Freq: Once | INTRAMUSCULAR | Status: AC
  Administered 2021-02-09: 4 mg via INTRAVENOUS
  Filled 2021-02-09: qty 2

## 2021-02-09 MED ORDER — FENTANYL CITRATE PF 50 MCG/ML IJ SOSY
50.0000 ug | PREFILLED_SYRINGE | Freq: Once | INTRAMUSCULAR | Status: AC
  Administered 2021-02-09: 50 ug via INTRAVENOUS
  Filled 2021-02-09: qty 1

## 2021-02-09 MED ORDER — DEXAMETHASONE SODIUM PHOSPHATE 10 MG/ML IJ SOLN
10.0000 mg | Freq: Once | INTRAMUSCULAR | Status: AC
  Administered 2021-02-09: 10 mg via INTRAVENOUS
  Filled 2021-02-09: qty 1

## 2021-02-09 MED ORDER — HYDRALAZINE HCL 20 MG/ML IJ SOLN
10.0000 mg | Freq: Once | INTRAMUSCULAR | Status: AC
  Administered 2021-02-09: 10 mg via INTRAVENOUS
  Filled 2021-02-09: qty 1

## 2021-02-09 MED ORDER — DIPHENHYDRAMINE HCL 50 MG/ML IJ SOLN
12.5000 mg | Freq: Once | INTRAMUSCULAR | Status: AC
  Administered 2021-02-09: 12.5 mg via INTRAVENOUS
  Filled 2021-02-09: qty 1

## 2021-02-09 MED ORDER — ENOXAPARIN SODIUM 40 MG/0.4ML IJ SOSY: 40.0000 mg | PREFILLED_SYRINGE | INTRAMUSCULAR | Status: DC

## 2021-02-09 MED ORDER — POTASSIUM CHLORIDE IN NACL 40-0.9 MEQ/L-% IV SOLN
INTRAVENOUS | Status: DC
  Filled 2021-02-09 (×9): qty 1000

## 2021-02-09 MED ORDER — METOPROLOL TARTRATE 5 MG/5ML IV SOLN
5.0000 mg | Freq: Once | INTRAVENOUS | Status: AC
  Administered 2021-02-09: 5 mg via INTRAVENOUS
  Filled 2021-02-09: qty 5

## 2021-02-09 MED ORDER — DROPERIDOL 2.5 MG/ML IJ SOLN
1.2500 mg | Freq: Once | INTRAMUSCULAR | Status: DC
  Filled 2021-02-09: qty 2

## 2021-02-09 MED ORDER — SODIUM CHLORIDE 0.9 % IV BOLUS
1000.0000 mL | Freq: Once | INTRAVENOUS | Status: AC
  Administered 2021-02-09: 1000 mL via INTRAVENOUS

## 2021-02-09 MED ORDER — SODIUM CHLORIDE 0.9 % IV BOLUS
500.0000 mL | Freq: Once | INTRAVENOUS | Status: AC
  Administered 2021-02-10: 500 mL via INTRAVENOUS

## 2021-02-09 MED ORDER — METOCLOPRAMIDE HCL 5 MG/ML IJ SOLN
10.0000 mg | Freq: Once | INTRAMUSCULAR | Status: AC
  Administered 2021-02-09: 10 mg via INTRAVENOUS
  Filled 2021-02-09: qty 2

## 2021-02-09 MED ORDER — NICARDIPINE HCL IN NACL 20-0.86 MG/200ML-% IV SOLN
3.0000 mg/h | INTRAVENOUS | Status: DC
  Administered 2021-02-09: 5 mg/h via INTRAVENOUS
  Administered 2021-02-10: 3 mg/h via INTRAVENOUS
  Administered 2021-02-10 – 2021-02-11 (×2): 5 mg/h via INTRAVENOUS
  Filled 2021-02-09 (×6): qty 200

## 2021-02-09 MED ORDER — SODIUM CHLORIDE 0.9 % IV SOLN
25.0000 mg | Freq: Four times a day (QID) | INTRAVENOUS | Status: DC | PRN
Start: 2021-02-09 — End: 2021-02-09
  Filled 2021-02-09: qty 1

## 2021-02-09 MED ORDER — ONDANSETRON HCL 4 MG/2ML IJ SOLN
4.0000 mg | Freq: Four times a day (QID) | INTRAMUSCULAR | Status: DC | PRN
  Administered 2021-02-10 – 2021-02-13 (×11): 4 mg via INTRAVENOUS
  Filled 2021-02-09 (×11): qty 2

## 2021-02-09 MED ORDER — PROMETHAZINE HCL 25 MG/ML IJ SOLN
INTRAMUSCULAR | Status: AC
  Filled 2021-02-09: qty 1

## 2021-02-09 MED ORDER — POTASSIUM CHLORIDE 10 MEQ/100ML IV SOLN
10.0000 meq | INTRAVENOUS | Status: AC
  Administered 2021-02-10 (×2): 10 meq via INTRAVENOUS
  Filled 2021-02-09 (×2): qty 100

## 2021-02-09 MED ORDER — FAMOTIDINE IN NACL 20-0.9 MG/50ML-% IV SOLN
20.0000 mg | Freq: Once | INTRAVENOUS | Status: AC
  Administered 2021-02-09: 20 mg via INTRAVENOUS
  Filled 2021-02-09: qty 50

## 2021-02-09 MED ORDER — FENTANYL CITRATE PF 50 MCG/ML IJ SOSY
50.0000 ug | PREFILLED_SYRINGE | Freq: Once | INTRAMUSCULAR | Status: AC
Start: 2021-02-09 — End: 2021-02-09
  Administered 2021-02-09: 50 ug via INTRAVENOUS
  Filled 2021-02-09: qty 1

## 2021-02-09 NOTE — ED Notes (Signed)
Patient transported to CT 

## 2021-02-09 NOTE — ED Provider Notes (Signed)
[facility] EMERGENCY DEPARTMENT Provider Note   CSN: [id number] Arrival date & time: 02/09/21  1521     History Chief Complaint  Patient presents with   Hypertension    [name] is a 60 y.o. female.  She is here with complaint of headache that started 2 days ago across her temples and down into her neck.  Associated with nausea and vomiting.  She says she had migraines in the past but has not had one for years.  She is also having some blurry vision and double vision that started today.  Saw her PCP who sent her to the emergency department.  Blood pressure was elevated.  No fevers or chills.  No numbness or weakness.  The history is provided by the patient.  Headache Pain location:  L temporal and R temporal Quality:  Dull Radiates to:  R neck and L neck Severity currently:  10/10 Severity at highest:  10/10 Onset quality:  Gradual Duration:  3 days Timing:  Constant Progression:  Unchanged Chronicity:  New Context comment:  Candle smell triggered Relieved by:  Nothing Worsened by:  Nothing Ineffective treatments:  Resting in a darkened room Associated symptoms: back pain (chronic), blurred vision, nausea, photophobia and vomiting   Associated symptoms: no abdominal pain, no cough, no diarrhea, no facial pain, no fever, no focal weakness, no neck stiffness, no numbness, no sore throat and no weakness       Past Medical History:  Diagnosis Date   Anxiety    Asthma    COPD (chronic obstructive pulmonary disease) (HCC)    Depression    Disc degeneration, lumbar    GERD (gastroesophageal reflux disease)    Hypertension    Leukocytosis    Lumbar disc disease    Shortness of breath    Thrombocytosis     Patient Active Problem List   Diagnosis Date Noted   Thrush 02/06/2019   Hypertensive urgency 05/04/2017   Elevated troponin 05/04/2017   Decompensated hepatic cirrhosis ([place]) 05/04/2017   Ascites 05/04/2017   Chronic pain 05/04/2017   GERD  (gastroesophageal reflux disease) 05/04/2017   Hypothyroidism 05/04/2017   Nausea and vomiting 05/03/2017   RUQ pain 11/20/2016   Abnormal LFTs 11/20/2016   Dysphagia, pharyngoesophageal phase 03/10/2014   Constipation 03/09/2014   FH: colon cancer in first degree relative <73 years old 03/09/2014   Iron deficiency anemia secondary to blood loss (chronic) 38/03/1750   Folic acid deficiency 02/58/5277   Leukocytosis 01/01/2012   Thrombocytosis    TRANSAMINASES, SERUM, ELEVATED 10/24/2009   HEMATEMESIS 06/28/2009   HEMATOCHEZIA 03/07/2009   Nausea with vomiting 03/07/2009   Diarrhea 03/07/2009   Abdominal pain 03/07/2009   History of colonic polyps 03/07/2009   FATTY LIVER DISEASE, HX OF 03/07/2009   CARPAL TUNNEL SYNDROME 03/03/2008   MONONEURITIS, LEG 03/03/2008    Past Surgical History:  Procedure Laterality Date   ABDOMINAL HYSTERECTOMY     BIOPSY  03/25/2014   Procedure: GASTRIC BIOPSIES;  Surgeon: Daneil Dolin, MD;  Location: [facility];  Service: Endoscopy;;   BRAIN SURGERY     COLONOSCOPY  02/21/2007   OEU:MPNT papilla and internal hemorrhoids, diminutive rectal polyp/Left-sided diverticula (sigmoid) pedunculated polyps mid descending   COLONOSCOPY  2008   Dr. Gala Romney: segmental biopsy negative for microscopic colitis. Three polyps, one tubular adenoma. Surveillance due 2013.    COLONOSCOPY WITH PROPOFOL N/A 03/25/2014   RMR: Rectal and colonic polyps removed as described above.  Colonic diverticulosis. CT abnormality likely  artifactual in orgin     ESOPHAGOGASTRODUODENOSCOPY  08/08/2005   BJS:EGBTDV esophagogastroduodenoscopy.   ESOPHAGOGASTRODUODENOSCOPY  2011   Dr. Gala Romney: normal esophagus, small hiatal hernia, duodenal diverticulum, negative H.pylori and celiac   ESOPHAGOGASTRODUODENOSCOPY (EGD) WITH PROPOFOL N/A 03/25/2014   RMR: Probable occult cervical esophageal web- status post dilation as described above. Abnormal gastic mucosa of uncertain significance-status post  gasrtric biopsy   MALONEY DILATION N/A 03/25/2014   Procedure: MALONEY ESOPHAGEAL DILATION #54 Fr;  Surgeon: Daneil Dolin, MD;  Location: [facility];  Service: Endoscopy;  Laterality: N/A;   POLYPECTOMY  03/25/2014   Procedure: SIGMOID AND RECTAL POLYPECTOMY;  Surgeon: Daneil Dolin, MD;  Location: [facility];  Service: Endoscopy;;   right knee surgery     rt breast biopsy     TOTAL ABDOMINAL HYSTERECTOMY W/ BILATERAL SALPINGOOPHORECTOMY     age 70     OB History   No obstetric history on file.     Family History  Adopted: Yes  Problem Relation Age of Onset   Pancreatic cancer Mother    Hepatitis C Brother    Liver cancer Brother        suicide   Pancreatic cancer Maternal Aunt    Kidney cancer Maternal Uncle    Colon cancer Paternal Aunt    Kidney cancer Paternal Grandfather    Colon cancer Brother 56   Leukemia Sister        1/2 sister   Pancreatic cancer Paternal Grandmother    Pancreatic cancer Maternal Uncle     Social History   Tobacco Use   Smoking status: Every Day    Packs/day: 1.00    Years: 30.00    Pack years: 30.00    Types: Cigarettes   Smokeless tobacco: Never  Vaping Use   Vaping Use: Never used  Substance Use Topics   Alcohol use: No    Comment: see HPI. Patient denies alcohol use at present   Drug use: No    Home Medications Prior to Admission medications   Medication Sig Start Date End Date Taking? Authorizing Provider  ALPRAZolam Duanne Moron) 1 MG tablet Take 1 mg by mouth 3 (three) times daily as needed for anxiety.    [provider]  AMITIZA 24 MCG capsule TAKE 1 CAPSULE TWICE DAILY WITH MEALS. 01/06/18   Annitta Needs, NP  baclofen (LIORESAL) 10 MG tablet Take 10 mg by mouth at bedtime. May take up to 3 times daily for Muscle spasms    [provider]  DULoxetine (CYMBALTA) 60 MG capsule Take 90 mg by mouth at bedtime.     [provider]  estradiol (ESTRACE) 1 MG tablet Take 1 mg by mouth daily.    [provider]  furosemide (LASIX) 20 MG tablet Take 1 tablet (20 mg total) by mouth daily. 02/06/19   Mahala Menghini, PA-C  levothyroxine (SYNTHROID, LEVOTHROID) 25 MCG tablet Take 25 mcg by mouth daily. 10/30/16   [provider]  metoCLOPramide (REGLAN) 5 MG tablet Take 5 mg by mouth 4 (four) times daily.    [provider]  metoprolol tartrate (LOPRESSOR) 25 MG tablet TAKE (1/2) TABLET BY MOUTH TWICE DAILY. 06/09/19   Fay Records, MD  nystatin (MYCOSTATIN) 100000 UNIT/ML suspension Take 5 mLs by mouth 4 (four) times daily.    [provider]  omeprazole (PRILOSEC) 20 MG capsule Take 20 mg by mouth at bedtime.     [provider]  oxyCODONE (ROXICODONE) 15 MG  immediate release tablet Take 1-2 tablets by mouth every 4 (four) hours as needed.  01/28/19   [provider]  risperiDONE (RISPERDAL) 1 MG tablet Take 1 mg by mouth 2 (two) times daily.    [provider]  spironolactone (ALDACTONE) 50 MG tablet TAKE (1) TABLET BY MOUTH ONCE DAILY. 03/11/19   Carlis Stable, NP  venlafaxine (EFFEXOR) 75 MG tablet Take 75 mg by mouth daily.    [provider]  zolpidem (AMBIEN) 10 MG tablet Take 10 mg by mouth at bedtime.      [provider]    Allergies    Codeine, Latex, Lyrica [pregabalin], Nsaids, and Other  Review of Systems   Review of Systems  Constitutional:  Negative for fever.  HENT:  Negative for sore throat.   Eyes:  Positive for blurred vision, photophobia and visual disturbance.  Respiratory:  Negative for cough.   Cardiovascular:  Negative for chest pain.  Gastrointestinal:  Positive for nausea and vomiting. Negative for abdominal pain and diarrhea.  Musculoskeletal:  Positive for back pain (chronic). Negative for neck stiffness.  Skin:  Negative for rash.  Neurological:  Positive for headaches. Negative for focal weakness, weakness and numbness.   Physical Exam Updated Vital Signs BP (!) 169/128 (BP Location: Right Arm)    Pulse (!) 144   Temp 97.8 F (36.6 C) (Oral)   Resp (!) 21   Ht _0  (1.676 m)   Wt 88.5 kg   SpO2 100%   BMI 31.47 kg/m   Physical Exam Vitals and nursing note reviewed.  Constitutional:      General: She is not in acute distress.    Appearance: Normal appearance. She is well-developed.  HENT:     Head: Normocephalic and atraumatic.  Eyes:     Conjunctiva/sclera: Conjunctivae normal.  Cardiovascular:     Rate and Rhythm: Normal rate and regular rhythm.     Heart sounds: No murmur heard. Pulmonary:     Effort: Pulmonary effort is normal. No respiratory distress.     Breath sounds: Normal breath sounds.  Abdominal:     Palpations: Abdomen is soft.     Tenderness: There is no abdominal tenderness. There is no guarding or rebound.  Musculoskeletal:        General: No deformity or signs of injury. Normal range of motion.     Cervical back: Neck supple.  Skin:    General: Skin is warm and dry.  Neurological:     General: No focal deficit present.     Mental Status: She is alert and oriented to person, place, and time.     Cranial Nerves: No cranial nerve deficit.     Sensory: No sensory deficit.     Motor: No weakness.    ED Results / Procedures / Treatments   Labs (all labs ordered are listed, but only abnormal results are displayed) Labs Reviewed  CBC WITH DIFFERENTIAL/PLATELET - Abnormal; Notable for the following components:      Result Value   WBC 21.6 (*)    Hemoglobin 15.4 (*)    Platelets 426 (*)    Neutro Abs 14.0 (*)    Monocytes Absolute 1.6 (*)    Eosinophils Absolute 3.5 (*)    Abs Immature Granulocytes 0.16 (*)    All other components within normal limits  COMPREHENSIVE METABOLIC PANEL - Abnormal; Notable for the following components:   Potassium 3.3 (*)    Glucose, Bld 137 (*)    BUN  21 (*)    Creatinine, Ser 1.09 (*)    Calcium 8.6 (*)    Total Protein 6.2 (*)    Albumin 2.6 (*)    Alkaline Phosphatase 284 (*)    GFR, Estimated 59 (*)     All other components within normal limits  COMPREHENSIVE METABOLIC PANEL - Abnormal; Notable for the following components:   CO2 19 (*)    Glucose, Bld 131 (*)    BUN 29 (*)    Creatinine, Ser 1.25 (*)    Calcium 8.4 (*)    Albumin 2.8 (*)    AST 43 (*)    Alkaline Phosphatase 287 (*)    GFR, Estimated 50 (*)    All other components within normal limits  CBC - Abnormal; Notable for the following components:   WBC 26.3 (*)    RBC 5.33 (*)    Hemoglobin 16.4 (*)    HCT 47.8 (*)    Platelets 480 (*)    All other components within normal limits  TROPONIN I (HIGH SENSITIVITY) - Abnormal; Notable for the following components:   Troponin I (High Sensitivity) 184 (*)    All other components within normal limits  TROPONIN I (HIGH SENSITIVITY) - Abnormal; Notable for the following components:   Troponin I (High Sensitivity) 233 (*)    All other components within normal limits  TROPONIN I (HIGH SENSITIVITY) - Abnormal; Notable for the following components:   Troponin I (High Sensitivity) 252 (*)    All other components within normal limits  RESP PANEL BY RT-PCR (FLU A&B, COVID) ARPGX2  LIPASE, BLOOD  PROTIME-INR  APTT  MAGNESIUM  PHOSPHORUS  PROCALCITONIN  HIV ANTIBODY (ROUTINE TESTING W REFLEX)  RAPID URINE DRUG SCREEN, HOSP PERFORMED  TROPONIN I (HIGH SENSITIVITY)    EKG EKG Interpretation  Date/Time:  Thursday February 09 2021 17:16:24 EDT Ventricular Rate:  83 PR Interval:  188 QRS Duration: 84 QT Interval:  405 QTC Calculation: 476 R Axis:   60 Text Interpretation: Sinus rhythm Left ventricular hypertrophy Anterior Q waves, possibly due to LVH No significant change since prior 11/18 Confirmed by Aletta Edouard [phone]) on 02/09/2021 5:18:05 PM  Radiology CT Head Wo Contrast  Result Date: 02/09/2021 CLINICAL DATA:  Headache. EXAM: CT HEAD WITHOUT CONTRAST TECHNIQUE: Contiguous axial images were obtained from the base of the skull through the vertex without intravenous  contrast. COMPARISON:  None. FINDINGS: Brain: No evidence of acute infarction, hemorrhage, hydrocephalus, extra-axial collection or mass lesion/mass effect. Vascular: No hyperdense vessel or unexpected calcification. Skull: Normal. Negative for fracture or focal lesion. Sinuses/Orbits: No acute finding. Other: None. IMPRESSION: No acute intracranial abnormality seen. Electronically Signed   By: Marijo Conception M.D.   On: 02/09/2021 19:10   DG CHEST PORT 1 VIEW  Result Date: 02/10/2021 CLINICAL DATA:  Chest pain. EXAM: PORTABLE CHEST 1 VIEW COMPARISON:  February 24, 2015. FINDINGS: The heart size and mediastinal contours are within normal limits. Both lungs are clear. The visualized skeletal structures are unremarkable. IMPRESSION: No active disease. Electronically Signed   By: Marijo Conception M.D.   On: 02/10/2021 08:50   [place] EKG SITE RITE  Result Date: 02/10/2021 If Site Rite image not attached, placement could not be confirmed due to current cardiac rhythm.   Procedures .Critical Care Performed by: Hayden Rasmussen, MD Authorized by: Hayden Rasmussen, MD   Critical care provider statement:    Critical care time (minutes):  45   Critical care time was exclusive  of:  Separately billable procedures and treating other patients   Critical care was necessary to treat or prevent imminent or life-threatening deterioration of the following conditions:  Circulatory failure   Critical care was time spent personally by me on the following activities:  Discussions with consultants, evaluation of patient's response to treatment, examination of patient, ordering and performing treatments and interventions, ordering and review of laboratory studies, ordering and review of radiographic studies, pulse oximetry, re-evaluation of patient's condition, obtaining history from patient or surrogate, review of old charts and development of treatment plan with patient or surrogate   Medications Ordered in  ED Medications  droperidol (INAPSINE) 2.5 MG/ML injection 1.25 mg (1.25 mg Intravenous Not Given 02/09/21 2228)  nicardipine (CARDENE) 42m in 0.86% saline 2033mIV infusion (0.1 mg/ml) (2.5 mg/hr Intravenous Rate/Dose Change 02/10/21 0651)  0.9 % NaCl with KCl 40 mEq / L  infusion (0 mL/hr Intravenous Stopped 02/10/21 0619)  ondansetron (ZOFRAN) injection 4 mg (4 mg Intravenous Given 02/10/21 0540)  feeding supplement (ENSURE ENLIVE / ENSURE PLUS) liquid 237 mL (has no administration in time range)  ALPRAZolam (XANAX) tablet 1 mg (1 mg Oral Given 02/10/21 0539)  levothyroxine (SYNTHROID) tablet 25 mcg (25 mcg Oral Given 02/10/21 0540)  enoxaparin (LOVENOX) injection 87.5 mg (87.5 mg Subcutaneous Not Given 02/10/21 0505)  metoprolol tartrate (LOPRESSOR) tablet 25 mg (has no administration in time range)  promethazine (PHENERGAN) 12.5 mg in sodium chloride 0.9 % 50 mL IVPB (12.5 mg Intravenous New Bag/Given 02/10/21 0909)  sodium chloride 0.9 % bolus 1,000 mL (0 mLs Intravenous Stopped 02/09/21 1751)  metoCLOPramide (REGLAN) injection 10 mg (10 mg Intravenous Given 02/09/21 1707)  diphenhydrAMINE (BENADRYL) injection 12.5 mg (12.5 mg Intravenous Given 02/09/21 1659)  fentaNYL (SUBLIMAZE) injection 50 mcg (50 mcg Intravenous Given 02/09/21 1658)  ondansetron (ZOFRAN) injection 4 mg (4 mg Intravenous Given 02/09/21 1945)  dexamethasone (DECADRON) injection 10 mg (10 mg Intravenous Given 02/09/21 1945)  fentaNYL (SUBLIMAZE) injection 50 mcg (50 mcg Intravenous Given 02/09/21 1945)  metoprolol tartrate (LOPRESSOR) injection 5 mg (5 mg Intravenous Given 02/09/21 2109)  famotidine (PEPCID) IVPB 20 mg premix (0 mg Intravenous Stopped 02/09/21 2149)  hydrALAZINE (APRESOLINE) injection 10 mg (10 mg Intravenous Given 02/09/21 2237)  potassium chloride 10 mEq in 100 mL IVPB (0 mEq Intravenous Stopped 02/10/21 0226)  sodium chloride 0.9 % bolus 500 mL (0 mLs Intravenous Stopped 02/10/21 0452)  LORazepam (ATIVAN) injection 0.5 mg (0.5 mg  Intravenous Given 02/10/21 0111)  aspirin chewable tablet 324 mg (324 mg Oral Given 02/10/21 0344)    ED Course  I have reviewed the triage vital signs and the nursing notes.  Pertinent labs & imaging results that were available during my care of the patient were reviewed by me and considered in my medical decision making (see chart for details).  Clinical Course as of 02/10/21 0919  Thu Feb 09, 2021  1757 Patient states her head felt a little better but she still nauseous.  Blood pressure remains elevated.  I have ordered her some Phenergan. [MB]    Clinical Course User Index [MB] BuHayden RasmussenMD   MDM Rules/Calculators/A&P                          This patient complains of headache and nausea, blurry vision double vision, elevated blood pressure; this involves an extensive number of treatment Options and is a complaint that carries with it a high risk of complications and  Morbidity. The differential includes hypertensive emergency, bleed, stroke, withdrawal, metabolic derangement, renal failure  I ordered, reviewed and interpreted labs, which included CBC with elevated white count, hemoglobin stable, chemistries with elevated creatinine and elevated alk phos seen in prior, COVID testing negative I ordered medication migraine cocktail, nausea medication, IV beta-blocker, IV nicardipine drip for accelerated hypertension I ordered imaging studies which included CT head and I independently    visualized and interpreted imaging which showed no acute findings  Previous records obtained and reviewed in epic, no recent admissions.  Patient does have a remote admission of hypertensive emergency. I consulted Dr. Josephine Cables [facility] hospitalist and discussed lab and imaging findings  Critical Interventions: Treatment of patient's accelerating hypertension possible hypertensive emergency with IV medication to control her hypertension.  After the interventions stated above, I reevaluated the  patient and found patient still to be very symptomatic.  Poorly controlled hypertension and nausea symptoms.  Will need admission to the hospital for continued management.  Patient in agreement with plan.   Final Clinical Impression(s) / ED Diagnoses Final diagnoses:  Hypertensive urgency  Generalized headache  Nausea and vomiting, intractability of vomiting not specified, unspecified vomiting type    Rx / DC Orders ED Discharge Orders     None        Hayden Rasmussen, MD 02/10/21 [phone]

## 2021-02-09 NOTE — ED Notes (Addendum)
Pt blood pressure dropped to 120/92. Cardene drip stopped. Dr. Josephine Cables paged. Orders received.

## 2021-02-09 NOTE — H&P (Addendum)
History and Physical  [name] [id number] DOB: Aug 20, 1960 DOA: 02/09/2021  Referring physician: Hayden Rasmussen, MD PCP: Cory Munch, PA-C  Patient coming from: Home  Chief Complaint: Hypertension and headache  HPI: [name] is a 60 y.o. female with medical history significant for hypertension, hypothyroidism, GERD, anxiety, depression, chronic pain syndrome and tobacco abuse who presents to the emergency department due to onset of headache which was bilateral across temples and neck, this was associated with nausea and vomiting, headache worsens with lights on.  She has not been able to take her home medications since onset of symptoms due to vomiting, she states that she vomited blood pressure medication that she took yesterday and that she has not been able to tolerate any oral intake due to vomiting.  Patient complained of blurry vision and double vision which started today, so she went to her PCP who noted that she has elevated BP and she was asked to go to the ED for further evaluation and management.  Patient complained of intermittent sensation of feeling hot and cold, but she denies fever, chills, extremity weakness, diarrhea or constipation.  ED Course:  In the emergency department, BP was elevated at 233/130 and she was tachycardic, but other vital signs were within normal range.  Work-up in the ED showed leukocytosis, thrombocytosis and elevated hemoglobin at 15.4 BMP showed hypokalemia, CBG 137, albumin 2.6, ALP 284.  Lipase 28. CT of head without contrast showed no acute intracranial abnormality She was treated with Reglan, famotidine and Benadryl for migraine.  Pain medication was given.  BP was initially treated with IV Lopressor 5 mg x 1 and IV hydralazine 10 mg x 4 without much improvement, she was then started on IV nicardipine drip.  Hospitalist was asked to admit patient for further evaluation and management.  Review of Systems: Constitutional:  Negative for chills and fever.  HENT: Negative for ear pain and sore throat.   Eyes: Positive for blurred vision and visual disturbance.  Respiratory: Negative for cough, chest tightness and shortness of breath.   Cardiovascular: Positive for chest pain and negative for palpitations.  Gastrointestinal: Positive for abdominal soreness, nausea and vomiting.  Endocrine: Negative for polyphagia and polyuria.  Genitourinary: Negative for decreased urine volume, dysuria, enuresis Musculoskeletal: Positive for chronic back pain.  Negative for arthralgias   Skin: Negative for color change and rash.  Allergic/Immunologic: Negative for immunocompromised state.  Neurological: Positive for headaches.  Negative for tremors, syncope, speech difficulty Hematological: Does not bruise/bleed easily.  All other systems reviewed and are negative   Past Medical History:  Diagnosis Date   Anxiety    Asthma    COPD (chronic obstructive pulmonary disease) (HCC)    Depression    Disc degeneration, lumbar    GERD (gastroesophageal reflux disease)    Hypertension    Leukocytosis    Lumbar disc disease    Shortness of breath    Thrombocytosis    Past Surgical History:  Procedure Laterality Date   ABDOMINAL HYSTERECTOMY     BIOPSY  03/25/2014   Procedure: GASTRIC BIOPSIES;  Surgeon: Daneil Dolin, MD;  Location: [facility];  Service: Endoscopy;;   BRAIN SURGERY     COLONOSCOPY  02/21/2007   [id number] papilla and internal hemorrhoids, diminutive rectal polyp/Left-sided diverticula (sigmoid) pedunculated polyps mid descending   COLONOSCOPY  2008   Dr. Gala Romney: segmental biopsy negative for microscopic colitis. Three polyps, one tubular adenoma. Surveillance due 2013.    COLONOSCOPY WITH PROPOFOL  N/A 03/25/2014   RMR: Rectal and colonic polyps removed as described above.  Colonic diverticulosis. CT abnormality likely artifactual in orgin     ESOPHAGOGASTRODUODENOSCOPY  08/08/2005   [id number]  esophagogastroduodenoscopy.   ESOPHAGOGASTRODUODENOSCOPY  2011   Dr. Gala Romney: normal esophagus, small hiatal hernia, duodenal diverticulum, negative H.pylori and celiac   ESOPHAGOGASTRODUODENOSCOPY (EGD) WITH PROPOFOL N/A 03/25/2014   RMR: Probable occult cervical esophageal web- status post dilation as described above. Abnormal gastic mucosa of uncertain significance-status post gasrtric biopsy   MALONEY DILATION N/A 03/25/2014   Procedure: MALONEY ESOPHAGEAL DILATION #54 Fr;  Surgeon: Daneil Dolin, MD;  Location: [facility];  Service: Endoscopy;  Laterality: N/A;   POLYPECTOMY  03/25/2014   Procedure: SIGMOID AND RECTAL POLYPECTOMY;  Surgeon: Daneil Dolin, MD;  Location: [facility];  Service: Endoscopy;;   right knee surgery     rt breast biopsy     TOTAL ABDOMINAL HYSTERECTOMY W/ BILATERAL SALPINGOOPHORECTOMY     age 60    Social History:  reports that she has been smoking cigarettes. She has a 30.00 pack-year smoking history. She has never used smokeless tobacco. She reports that she does not drink alcohol and does not use drugs.   Allergies  Allergen Reactions   Codeine Hives and Other (See Comments)    Hallucinations   Latex Hives and Itching   Lyrica [Pregabalin] Hives   Nsaids    Other Hives and Itching    Oral vitamins    Family History  Adopted: Yes  Problem Relation Age of Onset   Pancreatic cancer Mother    Hepatitis C Brother    Liver cancer Brother        suicide   Pancreatic cancer Maternal Aunt    Kidney cancer Maternal Uncle    Colon cancer Paternal Aunt    Kidney cancer Paternal Grandfather    Colon cancer Brother 71   Leukemia Sister        1/2 sister   Pancreatic cancer Paternal Grandmother    Pancreatic cancer Maternal Uncle      Prior to Admission medications   Medication Sig Start Date End Date Taking? Authorizing Provider  ALPRAZolam Duanne Moron) 1 MG tablet Take 1 mg by mouth 3 (three) times daily as needed for anxiety.   Yes [provider]   baclofen (LIORESAL) 10 MG tablet Take 10 mg by mouth at bedtime. May take up to 3 times daily for Muscle spasms   Yes [provider]  DULoxetine (CYMBALTA) 30 MG capsule Take 30 mg by mouth daily. 01/09/21  Yes [provider]  DULoxetine (CYMBALTA) 60 MG capsule Take 60 mg by mouth at bedtime.   Yes [provider]  estradiol (ESTRACE) 1 MG tablet Take 1 mg by mouth daily.   Yes [provider]  levothyroxine (SYNTHROID, LEVOTHROID) 25 MCG tablet Take 25 mcg by mouth daily. 10/30/16  Yes [provider]  metoCLOPramide (REGLAN) 5 MG tablet Take 5 mg by mouth 2 (two) times daily.   Yes [provider]  metoprolol tartrate (LOPRESSOR) 25 MG tablet TAKE (1/2) TABLET BY MOUTH TWICE DAILY. Patient taking differently: Take 12.5 mg by mouth 2 (two) times daily. 06/09/19  Yes Fay Records, MD  omeprazole (PRILOSEC) 20 MG capsule Take 20 mg by mouth at bedtime.    Yes [provider]  oxycodone (ROXICODONE) 30 MG immediate release tablet Take 30 mg by mouth 4 (four) times daily as needed. 02/09/21  Yes [provider]  risperiDONE (  RISPERDAL) 1 MG tablet Take 1 mg by mouth 2 (two) times daily.   Yes [provider]  venlafaxine (EFFEXOR) 75 MG tablet Take 75 mg by mouth daily.   Yes [provider]  zolpidem (AMBIEN) 10 MG tablet Take 10 mg by mouth at bedtime.     Yes [provider]  AMITIZA 24 MCG capsule TAKE 1 CAPSULE TWICE DAILY WITH MEALS. Patient not taking: Reported on 02/09/2021 01/06/18   Annitta Needs, NP  furosemide (LASIX) 20 MG tablet Take 1 tablet (20 mg total) by mouth daily. Patient not taking: Reported on 02/09/2021 02/06/19   Mahala Menghini, PA-C  nystatin (MYCOSTATIN) 100000 UNIT/ML suspension Take 5 mLs by mouth 4 (four) times daily. Patient not taking: Reported on 02/09/2021    [provider]  oxyCODONE (ROXICODONE) 15 MG immediate release tablet Take 1-2 tablets by mouth every 4 (four)  hours as needed.  Patient not taking: Reported on 02/09/2021 01/28/19   [provider]  promethazine (PHENERGAN) 25 MG tablet Take 25 mg by mouth every 6 (six) hours as needed. 02/03/21   [provider]  spironolactone (ALDACTONE) 50 MG tablet TAKE (1) TABLET BY MOUTH ONCE DAILY. Patient not taking: No sig reported 03/11/19   Carlis Stable, NP    Physical Exam: BP (S) (!) 120/92 Comment: Cardene drip stopped  Pulse (!) 145   Temp 97.8 F (36.6 C) (Oral)   Resp 19   Ht '5\' 6"'$  (1.676 m)   Wt 88.5 kg   SpO2 98%   BMI 31.47 kg/m   General: 60 y.o. year-old female well developed well nourished in no acute distress.  Alert and oriented x3. HEENT: NCAT, EOMI Neck: Supple, trachea medial Cardiovascular: Tachycardia.  Regular rate and rhythm with no rubs or gallops.  No thyromegaly or JVD noted.  No lower extremity edema. 2/4 pulses in all 4 extremities. Respiratory: Clear to auscultation with no wheezes or rales. Good inspiratory effort. Abdomen: Soft, nontender nondistended with normal bowel sounds x4 quadrants. Muskuloskeletal: No cyanosis, clubbing or edema noted bilaterally Neuro: CN II-XII intact, strength 5/5 x 4, sensation, reflexes intact Skin: No ulcerative lesions noted or rashes Psychiatry: Judgement and insight appear normal.  Anxious, mood is appropriate for condition and setting          Labs on Admission:  Basic Metabolic Panel: Recent Labs  Lab 02/09/21 1726  NA 139  K 3.3*  CL 109  CO2 22  GLUCOSE 137*  BUN 21*  CREATININE 1.09*  CALCIUM 8.6*   Liver Function Tests: Recent Labs  Lab 02/09/21 1726  AST 33  ALT 17  ALKPHOS 284*  BILITOT 1.1  PROT 6.2*  ALBUMIN 2.6*   Recent Labs  Lab 02/09/21 1726  LIPASE 28   No results for input(s): AMMONIA in the last 168 hours. CBC: Recent Labs  Lab 02/09/21 1726  WBC 21.6*  NEUTROABS 14.0*  HGB 15.4*  HCT 45.0  MCV 89.6  PLT 426*   Cardiac Enzymes: No results for input(s): CKTOTAL,  CKMB, CKMBINDEX, TROPONINI in the last 168 hours.  BNP (last 3 results) No results for input(s): BNP in the last 8760 hours.  ProBNP (last 3 results) No results for input(s): PROBNP in the last 8760 hours.  CBG: No results for input(s): GLUCAP in the last 168 hours.  Radiological Exams on Admission: CT Head Wo Contrast  Result Date: 02/09/2021 CLINICAL DATA:  Headache. EXAM: CT HEAD WITHOUT CONTRAST TECHNIQUE: Contiguous axial images were obtained  from the base of the skull through the vertex without intravenous contrast. COMPARISON:  None. FINDINGS: Brain: No evidence of acute infarction, hemorrhage, hydrocephalus, extra-axial collection or mass lesion/mass effect. Vascular: No hyperdense vessel or unexpected calcification. Skull: Normal. Negative for fracture or focal lesion. Sinuses/Orbits: No acute finding. Other: None. IMPRESSION: No acute intracranial abnormality seen. Electronically Signed   By: Marijo Conception M.D.   On: 02/09/2021 19:10    EKG: I independently viewed the EKG done and my findings are as followed: Normal sinus rhythm at a rate of 83 bpm with LVH  Assessment/Plan Present on Admission:  Hypothyroidism  Nausea with vomiting  Thrombocytosis  Leukocytosis  Hypertensive urgency  Chronic pain  Active Problems:   Nausea with vomiting   Leukocytosis   Thrombocytosis   Hypertensive urgency   Chronic pain   Hypothyroidism   Migraine headache   Dehydration   Hypokalemia   Hypoalbuminemia due to protein-calorie malnutrition (HCC)   Elevated alkaline phosphatase in newborn   Tobacco abuse  Questionable migraine headache She was treated with Decadron, Pepcid, Reglan and Benadryl Continue to monitor patient and treat accordingly  Nausea and vomiting in the setting of above Continue IV Zofran 4 mg every 6 hours as needed  Chest pain rule out ACS Elevated troponin possibly secondary to type II demand ischemia rule out NSTEMI Troponin x1-184; she complained of  improved midsternal chest pressure, continue to trend troponin Therapeutic Lovenox will be given Aspirin 324 mg x 1 was given (patient denies allergy to aspirin) Repeated EKG personally reviewed showed sinus tachycardia at a rate of 119 bpm with ST depression in leads V5 and V6 Cardiology will be consulted in the morning  Hypertensive urgency (resolved) Essential hypertension Patient was initially treated with IV metoprolol 5 mg and IV hydralazine 10 mg without much improvement IV nicardipine drip was initiated in the ED and this was stopped due to patient's BP at 120/92 She was noted not to be on any antihypertensive medication at home, EKG shows LVH which may be indicative of chronic untreated hypertension. Continue to monitor patient and control BP Patient will need outpatient follow-up with PCP for BP monitoring/control Of note: Patient's headache appears to be related to the elevated BP since the headache improved with improved BP.  Possible opioid withdrawal symptoms Chronic pain syndrome Patient has history of chronic back pain and was chronically on oxycodone at home.  Last dose was about 3 days ago due to nausea and vomiting. She complained of high-temperature and/or chills, anxiety, restlessness, sweating IV Ativan 0.5 mg x 1 will be given, continue home Xanax once able to tolerate oral intake Continue Zofran for nausea vomiting Continue IV hydration Resume home medication once patient is able to tolerate oral intake  Thrombocytosis/leukocytosis possibly due to hemoconcentration Dehydration Continue IV hydration  Hypokalemia K+ is 3.3 K+ will be replenished Please monitor for AM K+ for further replenishmemnt  Hypoalbuminemia possibly secondary to moderate protein calorie malnutrition Albumin 2.6, protein supplement will be provided  Elevated alkaline phosphatase ALP 284, she complained of abdominal soreness from recurrent vomiting Continue to monitor liver enzymes with  morning labs  Hypothyroidism Continue Synthroid when patient is able to tolerate oral intake  Anxiety and depression IV Ativan 0.5 mg x 1 will be given Continue home Xanax when patient is able to tolerate oral intake Cymbalta and Effexor will be held at this time due to headache side effect profile  Tobacco abuse Patient has 19.5-year smoking history She was counseled on  tobacco abuse cessation Last tobacco use was 3 days ago (9/6)  DVT prophylaxis: Lovenox  Code Status: Full code  Family Communication: None at bedside  Disposition Plan:  Patient is from:                        home Anticipated DC to:                   SNF or family members home Anticipated DC date:               2-3 days Anticipated DC barriers:          Patient requires inpatient management due to headache, nausea and vomiting with inability to tolerate oral intake and possible opioid withdrawal symptoms  Consults called: None  Admission status: Observation    Bernadette Hoit MD [facility]  02/10/2021, 12:12 AM

## 2021-02-09 NOTE — ED Notes (Signed)
Pt systolic blood pressure in 150s, Dr. Josephine Cables at bedside and agreed to keep Cardene gtt @ 7.5.

## 2021-02-09 NOTE — ED Triage Notes (Signed)
Pt. Arrived via POV with complaints of a migraine. Pt. States these migraines started yesterday and makes her feel nauseous and has been vomiting since yesterday morning.

## 2021-02-10 ENCOUNTER — Observation Stay (HOSPITAL_COMMUNITY): Payer: Medicare HMO

## 2021-02-10 ENCOUNTER — Encounter (HOSPITAL_COMMUNITY): Payer: Self-pay | Admitting: Internal Medicine

## 2021-02-10 ENCOUNTER — Inpatient Hospital Stay (HOSPITAL_COMMUNITY): Payer: Medicare HMO

## 2021-02-10 ENCOUNTER — Observation Stay: Payer: Self-pay

## 2021-02-10 DIAGNOSIS — Z6831 Body mass index (BMI) 31.0-31.9, adult: Secondary | ICD-10-CM | POA: Diagnosis not present

## 2021-02-10 DIAGNOSIS — R778 Other specified abnormalities of plasma proteins: Secondary | ICD-10-CM | POA: Diagnosis not present

## 2021-02-10 DIAGNOSIS — I169 Hypertensive crisis, unspecified: Secondary | ICD-10-CM | POA: Diagnosis not present

## 2021-02-10 DIAGNOSIS — R112 Nausea with vomiting, unspecified: Secondary | ICD-10-CM | POA: Diagnosis not present

## 2021-02-10 DIAGNOSIS — E039 Hypothyroidism, unspecified: Secondary | ICD-10-CM | POA: Diagnosis present

## 2021-02-10 DIAGNOSIS — D75839 Thrombocytosis, unspecified: Secondary | ICD-10-CM | POA: Diagnosis present

## 2021-02-10 DIAGNOSIS — E46 Unspecified protein-calorie malnutrition: Secondary | ICD-10-CM

## 2021-02-10 DIAGNOSIS — I10 Essential (primary) hypertension: Secondary | ICD-10-CM

## 2021-02-10 DIAGNOSIS — E876 Hypokalemia: Secondary | ICD-10-CM

## 2021-02-10 DIAGNOSIS — G43909 Migraine, unspecified, not intractable, without status migrainosus: Secondary | ICD-10-CM

## 2021-02-10 DIAGNOSIS — Z720 Tobacco use: Secondary | ICD-10-CM | POA: Diagnosis not present

## 2021-02-10 DIAGNOSIS — K7460 Unspecified cirrhosis of liver: Secondary | ICD-10-CM | POA: Diagnosis not present

## 2021-02-10 DIAGNOSIS — K3184 Gastroparesis: Secondary | ICD-10-CM | POA: Diagnosis present

## 2021-02-10 DIAGNOSIS — R519 Headache, unspecified: Secondary | ICD-10-CM | POA: Diagnosis not present

## 2021-02-10 DIAGNOSIS — Z20822 Contact with and (suspected) exposure to covid-19: Secondary | ICD-10-CM | POA: Diagnosis not present

## 2021-02-10 DIAGNOSIS — R188 Other ascites: Secondary | ICD-10-CM | POA: Diagnosis not present

## 2021-02-10 DIAGNOSIS — F17210 Nicotine dependence, cigarettes, uncomplicated: Secondary | ICD-10-CM | POA: Diagnosis present

## 2021-02-10 DIAGNOSIS — F439 Reaction to severe stress, unspecified: Secondary | ICD-10-CM | POA: Diagnosis present

## 2021-02-10 DIAGNOSIS — N179 Acute kidney failure, unspecified: Secondary | ICD-10-CM | POA: Diagnosis not present

## 2021-02-10 DIAGNOSIS — R0789 Other chest pain: Secondary | ICD-10-CM | POA: Diagnosis not present

## 2021-02-10 DIAGNOSIS — E440 Moderate protein-calorie malnutrition: Secondary | ICD-10-CM | POA: Diagnosis not present

## 2021-02-10 DIAGNOSIS — R748 Abnormal levels of other serum enzymes: Secondary | ICD-10-CM

## 2021-02-10 DIAGNOSIS — I248 Other forms of acute ischemic heart disease: Secondary | ICD-10-CM | POA: Diagnosis not present

## 2021-02-10 DIAGNOSIS — R079 Chest pain, unspecified: Secondary | ICD-10-CM | POA: Diagnosis not present

## 2021-02-10 DIAGNOSIS — H532 Diplopia: Secondary | ICD-10-CM | POA: Diagnosis present

## 2021-02-10 DIAGNOSIS — F419 Anxiety disorder, unspecified: Secondary | ICD-10-CM | POA: Diagnosis present

## 2021-02-10 DIAGNOSIS — E860 Dehydration: Secondary | ICD-10-CM | POA: Diagnosis present

## 2021-02-10 DIAGNOSIS — E8809 Other disorders of plasma-protein metabolism, not elsewhere classified: Secondary | ICD-10-CM | POA: Diagnosis not present

## 2021-02-10 DIAGNOSIS — D72829 Elevated white blood cell count, unspecified: Secondary | ICD-10-CM | POA: Diagnosis present

## 2021-02-10 DIAGNOSIS — I160 Hypertensive urgency: Secondary | ICD-10-CM | POA: Diagnosis not present

## 2021-02-10 DIAGNOSIS — G894 Chronic pain syndrome: Secondary | ICD-10-CM | POA: Diagnosis not present

## 2021-02-10 DIAGNOSIS — F32A Depression, unspecified: Secondary | ICD-10-CM | POA: Diagnosis present

## 2021-02-10 DIAGNOSIS — J449 Chronic obstructive pulmonary disease, unspecified: Secondary | ICD-10-CM | POA: Diagnosis present

## 2021-02-10 LAB — PROTIME-INR
INR: 1.1 (ref 0.8–1.2)
Prothrombin Time: 14.4 seconds (ref 11.4–15.2)

## 2021-02-10 LAB — COMPREHENSIVE METABOLIC PANEL
ALT: 20 U/L (ref 0–44)
AST: 43 U/L — ABNORMAL HIGH (ref 15–41)
Albumin: 2.8 g/dL — ABNORMAL LOW (ref 3.5–5.0)
Alkaline Phosphatase: 287 U/L — ABNORMAL HIGH (ref 38–126)
Anion gap: 11 (ref 5–15)
BUN: 29 mg/dL — ABNORMAL HIGH (ref 6–20)
CO2: 19 mmol/L — ABNORMAL LOW (ref 22–32)
Calcium: 8.4 mg/dL — ABNORMAL LOW (ref 8.9–10.3)
Chloride: 110 mmol/L (ref 98–111)
Creatinine, Ser: 1.25 mg/dL — ABNORMAL HIGH (ref 0.44–1.00)
GFR, Estimated: 50 mL/min — ABNORMAL LOW (ref >60)
Glucose, Bld: 131 mg/dL — ABNORMAL HIGH (ref 70–99)
Potassium: 4 mmol/L (ref 3.5–5.1)
Sodium: 140 mmol/L (ref 135–145)
Total Bilirubin: 1.1 mg/dL (ref 0.3–1.2)
Total Protein: 6.7 g/dL (ref 6.5–8.1)

## 2021-02-10 LAB — HIV ANTIBODY (ROUTINE TESTING W REFLEX): HIV Screen 4th Generation wRfx: NONREACTIVE

## 2021-02-10 LAB — CBC
HCT: 47.8 % — ABNORMAL HIGH (ref 36.0–46.0)
Hemoglobin: 16.4 g/dL — ABNORMAL HIGH (ref 12.0–15.0)
MCH: 30.8 pg (ref 26.0–34.0)
MCHC: 34.3 g/dL (ref 30.0–36.0)
MCV: 89.7 fL (ref 80.0–100.0)
Platelets: 480 10*3/uL — ABNORMAL HIGH (ref 150–400)
RBC: 5.33 MIL/uL — ABNORMAL HIGH (ref 3.87–5.11)
RDW: 14.3 % (ref 11.5–15.5)
WBC: 26.3 10*3/uL — ABNORMAL HIGH (ref 4.0–10.5)
nRBC: 0 % (ref 0.0–0.2)

## 2021-02-10 LAB — RESP PANEL BY RT-PCR (FLU A&B, COVID) ARPGX2
Influenza A by PCR: NEGATIVE
Influenza B by PCR: NEGATIVE
SARS Coronavirus 2 by RT PCR: NEGATIVE

## 2021-02-10 LAB — MRSA NEXT GEN BY PCR, NASAL: MRSA by PCR Next Gen: NOT DETECTED

## 2021-02-10 LAB — TROPONIN I (HIGH SENSITIVITY)
Troponin I (High Sensitivity): 184 ng/L (ref <18)
Troponin I (High Sensitivity): 233 ng/L (ref <18)
Troponin I (High Sensitivity): 252 ng/L (ref <18)
Troponin I (High Sensitivity): 231 ng/L (ref <18)

## 2021-02-10 LAB — ECHOCARDIOGRAM COMPLETE
AR max vel: 2.66 cm2
AV Area VTI: 2.38 cm2
AV Area mean vel: 2.48 cm2
AV Mean grad: 6 mmHg
AV Peak grad: 10.5 mmHg
Ao pk vel: 1.62 m/s
Area-P 1/2: 6.22 cm2
Height: 66 in
MV VTI: 3.42 cm2
S' Lateral: 1.95 cm
Weight: 3120 oz

## 2021-02-10 LAB — RAPID URINE DRUG SCREEN, HOSP PERFORMED
Amphetamines: NOT DETECTED
Barbiturates: NOT DETECTED
Benzodiazepines: POSITIVE — AB
Cocaine: NOT DETECTED
Opiates: POSITIVE — AB
Tetrahydrocannabinol: POSITIVE — AB

## 2021-02-10 LAB — APTT: aPTT: 32 seconds (ref 24–36)

## 2021-02-10 LAB — PROCALCITONIN: Procalcitonin: 0.1 ng/mL

## 2021-02-10 LAB — PHOSPHORUS: Phosphorus: 2.9 mg/dL (ref 2.5–4.6)

## 2021-02-10 LAB — MAGNESIUM: Magnesium: 1.7 mg/dL (ref 1.7–2.4)

## 2021-02-10 MED ORDER — ENOXAPARIN SODIUM 100 MG/ML IJ SOSY: 1.0000 mg/kg | PREFILLED_SYRINGE | Freq: Two times a day (BID) | INTRAMUSCULAR | Status: DC

## 2021-02-10 MED ORDER — ENOXAPARIN SODIUM 40 MG/0.4ML IJ SOSY
40.0000 mg | PREFILLED_SYRINGE | INTRAMUSCULAR | Status: DC
  Administered 2021-02-12: 40 mg via SUBCUTANEOUS
  Filled 2021-02-10 (×3): qty 0.4

## 2021-02-10 MED ORDER — OXYCODONE HCL 5 MG PO TABS
30.0000 mg | ORAL_TABLET | Freq: Four times a day (QID) | ORAL | Status: DC | PRN
  Administered 2021-02-10 – 2021-02-13 (×8): 30 mg via ORAL
  Filled 2021-02-10 (×8): qty 6

## 2021-02-10 MED ORDER — SODIUM CHLORIDE 0.9 % IV SOLN
12.5000 mg | Freq: Four times a day (QID) | INTRAVENOUS | Status: DC | PRN
  Administered 2021-02-10: 12.5 mg via INTRAVENOUS
  Filled 2021-02-10: qty 0.5

## 2021-02-10 MED ORDER — LORAZEPAM 2 MG/ML IJ SOLN
0.5000 mg | Freq: Once | INTRAMUSCULAR | Status: AC | PRN
  Administered 2021-02-10: 0.5 mg via INTRAVENOUS
  Filled 2021-02-10: qty 1

## 2021-02-10 MED ORDER — VENLAFAXINE HCL 37.5 MG PO TABS
75.0000 mg | ORAL_TABLET | Freq: Every day | ORAL | Status: DC
  Administered 2021-02-10 – 2021-02-13 (×4): 75 mg via ORAL
  Filled 2021-02-10 (×4): qty 2

## 2021-02-10 MED ORDER — METOCLOPRAMIDE HCL 10 MG PO TABS
5.0000 mg | ORAL_TABLET | Freq: Two times a day (BID) | ORAL | Status: DC
  Administered 2021-02-10 – 2021-02-13 (×7): 5 mg via ORAL
  Filled 2021-02-10 (×7): qty 1

## 2021-02-10 MED ORDER — METOPROLOL TARTRATE 25 MG PO TABS
25.0000 mg | ORAL_TABLET | Freq: Two times a day (BID) | ORAL | Status: DC
  Administered 2021-02-10 – 2021-02-13 (×7): 25 mg via ORAL
  Filled 2021-02-10 (×7): qty 1

## 2021-02-10 MED ORDER — ASPIRIN 81 MG PO CHEW
324.0000 mg | CHEWABLE_TABLET | Freq: Once | ORAL | Status: AC
  Administered 2021-02-10: 324 mg via ORAL
  Filled 2021-02-10: qty 4

## 2021-02-10 MED ORDER — LEVOTHYROXINE SODIUM 25 MCG PO TABS
25.0000 ug | ORAL_TABLET | Freq: Every day | ORAL | Status: DC
  Administered 2021-02-10 – 2021-02-13 (×4): 25 ug via ORAL
  Filled 2021-02-10 (×4): qty 1

## 2021-02-10 MED ORDER — ALPRAZOLAM 0.5 MG PO TABS
1.0000 mg | ORAL_TABLET | Freq: Three times a day (TID) | ORAL | Status: DC | PRN
  Administered 2021-02-10 – 2021-02-12 (×6): 1 mg via ORAL
  Filled 2021-02-10 (×6): qty 2

## 2021-02-10 MED ORDER — SODIUM CHLORIDE 0.9% FLUSH
10.0000 mL | Freq: Two times a day (BID) | INTRAVENOUS | Status: DC
  Administered 2021-02-10 – 2021-02-12 (×6): 10 mL

## 2021-02-10 MED ORDER — LEVOTHYROXINE SODIUM 50 MCG PO TABS: 25.0000 ug | ORAL_TABLET | Freq: Every day | ORAL | Status: DC

## 2021-02-10 MED ORDER — ENSURE ENLIVE PO LIQD
237.0000 mL | Freq: Two times a day (BID) | ORAL | Status: DC
  Administered 2021-02-10 (×2): 237 mL via ORAL
  Filled 2021-02-10 (×8): qty 237

## 2021-02-10 MED ORDER — IOHEXOL 350 MG/ML SOLN
100.0000 mL | Freq: Once | INTRAVENOUS | Status: AC | PRN
  Administered 2021-02-10: 100 mL via INTRAVENOUS

## 2021-02-10 MED ORDER — CHLORHEXIDINE GLUCONATE CLOTH 2 % EX PADS
6.0000 | MEDICATED_PAD | Freq: Every day | CUTANEOUS | Status: DC
  Administered 2021-02-10: 6 via TOPICAL

## 2021-02-10 MED ORDER — DULOXETINE HCL 60 MG PO CPEP
60.0000 mg | ORAL_CAPSULE | Freq: Every day | ORAL | Status: DC
  Administered 2021-02-10 – 2021-02-12 (×3): 60 mg via ORAL
  Filled 2021-02-10 (×3): qty 1

## 2021-02-10 MED ORDER — SODIUM CHLORIDE 0.9% FLUSH: 10.0000 mL | INTRAVENOUS | Status: DC | PRN

## 2021-02-10 MED ORDER — BACLOFEN 10 MG PO TABS
10.0000 mg | ORAL_TABLET | Freq: Every day | ORAL | Status: DC
  Administered 2021-02-10 – 2021-02-12 (×3): 10 mg via ORAL
  Filled 2021-02-10 (×3): qty 1

## 2021-02-10 NOTE — ED Notes (Signed)
Date and time results received: 02/10/21 4:42 AM  Test: Troponin Critical Value: 233  Name of Provider Notified: Adefeso  Orders Received? Or Actions Taken?:

## 2021-02-10 NOTE — Progress Notes (Addendum)
PROGRESS NOTE    [name]  [id number] DOB: 08-17-1960 DOA: 02/09/2021 PCP: No primary care provider on file.   Brief Narrative:   [name] is a 60 y.o. female with medical history significant for hypertension, hypothyroidism, GERD, anxiety, depression, chronic pain syndrome and tobacco abuse who presents to the emergency department due to onset of headache which was bilateral across temples and neck, this was associated with nausea and vomiting, headache worsens with lights on.  She has not been able to take her home medications since onset of symptoms due to vomiting, she states that she vomited blood pressure medication that she took yesterday and that she has not been able to tolerate any oral intake due to vomiting.   Assessment & Plan:   Active Problems:   Nausea with vomiting   Leukocytosis   Thrombocytosis   Hypertensive urgency   Chronic pain   Hypothyroidism   Migraine headache   Dehydration   Hypokalemia   Hypoalbuminemia due to protein-calorie malnutrition (HCC)   Elevated alkaline phosphatase in newborn   Tobacco abuse   Hypertensive crisis   Symptomatic hypertensive crisis -Continue Cardene drip and wean as tolerated -Restarted home metoprolol at higher dose of 25 mg twice daily -Plan to start other oral medications such as ACE inhibitor's and CCB's prior to discharge -CTA negative for dissection  Intractable nausea and vomiting -Possibly related to migraine headache versus gastroenteritis -Zofran and Phenergan prescribed as needed -Restart home Reglan, may have been using due to some gastroparesis  Troponin elevation secondary to above -Troponin levels flat -Appreciate cardiology evaluation -2D echocardiogram without any new wall motion abnormalities  Possible opioid withdrawal symptoms -In the setting of chronic pain with oxycodone use -Plan to resume medication as tolerated  Leukocytosis -Likely stress reaction to above as well as  some hemoconcentration -Procalcitonin low -Continue to monitor  Hypothyroidism -Continue Synthroid  Anxiety/depression -Continue Xanax, Cymbalta, and Effexor  Tobacco abuse -Counseled on cessation   DVT prophylaxis:Lovenox Code Status: Full Family Communication: None at bedside Disposition Plan:  Status is: Inpatient  Remains inpatient appropriate because:Hemodynamically unstable, IV treatments appropriate due to intensity of illness or inability to take PO, and Inpatient level of care appropriate due to severity of illness  Dispo: The patient is from: Home              Anticipated d/c is to: Home              Patient currently is not medically stable to d/c.   Difficult to place patient No   Consultants:  Cardiology  Procedures:  See below  Antimicrobials:  None   Subjective: Patient seen and evaluated today with ongoing nausea with diminished chest pressure.  Objective: Vitals:   02/10/21 1000 02/10/21 1022 02/10/21 1030 02/10/21 1100  BP: (!) 165/84 (!) 151/91 (!) 162/91 (!) 134/104  Pulse: (!) 115 (!) 116 (!) 122 (!) 117  Resp:   (!) 21 (!) 23  Temp:      TempSrc:      SpO2: 99%  100% 100%  Weight:      Height:        Intake/Output Summary (Last 24 hours) at 02/10/2021 1314 Last data filed at 02/09/2021 2149 Gross per 24 hour  Intake 1044.59 ml  Output --  Net 1044.59 ml   Filed Weights   02/09/21 1551  Weight: 88.5 kg    Examination:  General exam: Appears calm and comfortable, appears anxious Respiratory system: Clear to auscultation. Respiratory  effort normal. Cardiovascular system: S1 & S2 heard, RRR.  Gastrointestinal system: Abdomen is soft Central nervous system: Alert and awake Extremities: No edema Skin: No significant lesions noted Psychiatry: Flat affect.    Data Reviewed: I have personally reviewed following labs and imaging studies  CBC: Recent Labs  Lab 02/09/21 1726 02/10/21 0654  WBC 21.6* 26.3*  NEUTROABS 14.0*   --   HGB 15.4* 16.4*  HCT 45.0 47.8*  MCV 89.6 89.7  PLT 426* 0000000*   Basic Metabolic Panel: Recent Labs  Lab 02/09/21 1726 02/10/21 0654  NA 139 140  K 3.3* 4.0  CL 109 110  CO2 22 19*  GLUCOSE 137* 131*  BUN 21* 29*  CREATININE 1.09* 1.25*  CALCIUM 8.6* 8.4*  MG  --  1.7  PHOS  --  2.9   GFR: Estimated Creatinine Clearance: 54.3 mL/min (A) (by C-G formula based on SCr of 1.25 mg/dL (H)). Liver Function Tests: Recent Labs  Lab 02/09/21 1726 02/10/21 0654  AST 33 43*  ALT 17 20  ALKPHOS 284* 287*  BILITOT 1.1 1.1  PROT 6.2* 6.7  ALBUMIN 2.6* 2.8*   Recent Labs  Lab 02/09/21 1726  LIPASE 28   No results for input(s): AMMONIA in the last 168 hours. Coagulation Profile: Recent Labs  Lab 02/10/21 0654  INR 1.1   Cardiac Enzymes: No results for input(s): CKTOTAL, CKMB, CKMBINDEX, TROPONINI in the last 168 hours. BNP (last 3 results) No results for input(s): PROBNP in the last 8760 hours. HbA1C: No results for input(s): HGBA1C in the last 72 hours. CBG: No results for input(s): GLUCAP in the last 168 hours. Lipid Profile: No results for input(s): CHOL, HDL, LDLCALC, TRIG, CHOLHDL, LDLDIRECT in the last 72 hours. Thyroid Function Tests: No results for input(s): TSH, T4TOTAL, FREET4, T3FREE, THYROIDAB in the last 72 hours. Anemia Panel: No results for input(s): VITAMINB12, FOLATE, FERRITIN, TIBC, IRON, RETICCTPCT in the last 72 hours. Sepsis Labs: Recent Labs  Lab 02/10/21 0654  PROCALCITON 0.10    Recent Results (from the past 240 hour(s))  Resp Panel by RT-PCR (Flu A&B, Covid) Nasopharyngeal Swab     Status: None   Collection Time: 02/09/21 10:11 PM   Specimen: Nasopharyngeal Swab; Nasopharyngeal(NP) swabs in vial transport medium  Result Value Ref Range Status   SARS Coronavirus 2 by RT PCR NEGATIVE NEGATIVE Final    Comment: (NOTE) SARS-CoV-2 target nucleic acids are NOT DETECTED.  The SARS-CoV-2 RNA is generally detectable in upper  respiratory specimens during the acute phase of infection. The lowest concentration of SARS-CoV-2 viral copies this assay can detect is 138 copies/mL. A negative result does not preclude SARS-Cov-2 infection and should not be used as the sole basis for treatment or other patient management decisions. A negative result may occur with  improper specimen collection/handling, submission of specimen other than nasopharyngeal swab, presence of viral mutation(s) within the areas targeted by this assay, and inadequate number of viral copies(<138 copies/mL). A negative result must be combined with clinical observations, patient history, and epidemiological information. The expected result is Negative.  Fact Sheet for Patients:  EntrepreneurPulse.com.au  Fact Sheet for Healthcare Providers:  IncredibleEmployment.be  This test is no t yet approved or cleared by the [place] FDA and  has been authorized for detection and/or diagnosis of SARS-CoV-2 by FDA under an Emergency Use Authorization (EUA). This EUA will remain  in effect (meaning this test can be used) for the duration of the COVID-19 declaration under Section 564(b)(1) of the  Act, 21 U.S.C.section 360bbb-3(b)(1), unless the authorization is terminated  or revoked sooner.       Influenza A by PCR NEGATIVE NEGATIVE Final   Influenza B by PCR NEGATIVE NEGATIVE Final    Comment: (NOTE) The Xpert Xpress SARS-CoV-2/FLU/RSV plus assay is intended as an aid in the diagnosis of influenza from Nasopharyngeal swab specimens and should not be used as a sole basis for treatment. Nasal washings and aspirates are unacceptable for Xpert Xpress SARS-CoV-2/FLU/RSV testing.  Fact Sheet for Patients: EntrepreneurPulse.com.au  Fact Sheet for Healthcare Providers: IncredibleEmployment.be  This test is not yet approved or cleared by the [place] FDA and has been  authorized for detection and/or diagnosis of SARS-CoV-2 by FDA under an Emergency Use Authorization (EUA). This EUA will remain in effect (meaning this test can be used) for the duration of the COVID-19 declaration under Section 564(b)(1) of the Act, 21 U.S.C. section 360bbb-3(b)(1), unless the authorization is terminated or revoked.  Performed at [facility], [address] [place], [place] [address]          Radiology Studies: CT Head Wo Contrast  Result Date: 02/09/2021 CLINICAL DATA:  Headache. EXAM: CT HEAD WITHOUT CONTRAST TECHNIQUE: Contiguous axial images were obtained from the base of the skull through the vertex without intravenous contrast. COMPARISON:  None. FINDINGS: Brain: No evidence of acute infarction, hemorrhage, hydrocephalus, extra-axial collection or mass lesion/mass effect. Vascular: No hyperdense vessel or unexpected calcification. Skull: Normal. Negative for fracture or focal lesion. Sinuses/Orbits: No acute finding. Other: None. IMPRESSION: No acute intracranial abnormality seen. Electronically Signed   By: Marijo Conception M.D.   On: 02/09/2021 19:10   DG CHEST PORT 1 VIEW  Result Date: 02/10/2021 CLINICAL DATA:  Chest pain. EXAM: PORTABLE CHEST 1 VIEW COMPARISON:  February 24, 2015. FINDINGS: The heart size and mediastinal contours are within normal limits. Both lungs are clear. The visualized skeletal structures are unremarkable. IMPRESSION: No active disease. Electronically Signed   By: Marijo Conception M.D.   On: 02/10/2021 08:50   ECHOCARDIOGRAM COMPLETE  Result Date: 02/10/2021    ECHOCARDIOGRAM REPORT   Patient Name:   Virgia Land Date of Exam: 02/10/2021 Medical Rec #:  [id number]         Height:       66.0 in Accession #:    [id number]        Weight:       195.0 lb Date of Birth:  Feb 18, 1961         BSA:          1.978 m Patient Age:    55 years          BP:           148/78 mmHg Patient Gender: F                 HR:           122 bpm. Exam Location:   [facility] Procedure: 2D Echo, Cardiac Doppler and Color Doppler Indications:    Elevated Troponin  History:        Patient has prior history of Echocardiogram examinations, most                 recent 05/05/2017. Arrythmias:Tachycardia; Risk                 Factors:Hypertension and Current Smoker.  Sonographer:    Wenda Low Referring Phys: [id number] [place]  1. Left ventricular ejection  fraction, by estimation, is 65 to 70%. The left ventricle has normal function. The left ventricle has no regional wall motion abnormalities. There is severe left ventricular hypertrophy. Left ventricular diastolic parameters  are indeterminate.  2. Right ventricular systolic function is normal. The right ventricular size is normal. Tricuspid regurgitation signal is inadequate for assessing PA pressure.  3. The pericardial effusion is circumferential.  4. The mitral valve is normal in structure. No evidence of mitral valve regurgitation. No evidence of mitral stenosis.  5. The aortic valve is tricuspid. Aortic valve regurgitation is not visualized. No aortic stenosis is present.  6. IVC is small, suggesting low RA pressure and hypovolemia. FINDINGS  Left Ventricle: Left ventricular ejection fraction, by estimation, is 65 to 70%. The left ventricle has normal function. The left ventricle has no regional wall motion abnormalities. The left ventricular internal cavity size was normal in size. There is  severe left ventricular hypertrophy. Left ventricular diastolic parameters are indeterminate. Right Ventricle: The right ventricular size is normal. No increase in right ventricular wall thickness. Right ventricular systolic function is normal. Tricuspid regurgitation signal is inadequate for assessing PA pressure. Left Atrium: Left atrial size was normal in size. Right Atrium: Right atrial size was normal in size. Pericardium: Trivial pericardial effusion is present. The pericardial effusion is circumferential.  Mitral Valve: The mitral valve is normal in structure. No evidence of mitral valve regurgitation. No evidence of mitral valve stenosis. MV peak gradient, 6.4 mmHg. The mean mitral valve gradient is 3.0 mmHg. Tricuspid Valve: The tricuspid valve is normal in structure. Tricuspid valve regurgitation is not demonstrated. No evidence of tricuspid stenosis. Aortic Valve: The aortic valve is tricuspid. Aortic valve regurgitation is not visualized. No aortic stenosis is present. Aortic valve mean gradient measures 6.0 mmHg. Aortic valve peak gradient measures 10.5 mmHg. Aortic valve area, by VTI measures 2.38  cm. Pulmonic Valve: The pulmonic valve was not well visualized. Pulmonic valve regurgitation is not visualized. No evidence of pulmonic stenosis. Aorta: The aortic root is normal in size and structure. Venous: IVC is small, suggesting low RA pressure and hypovolemia. IAS/Shunts: No atrial level shunt detected by color flow Doppler.  LEFT VENTRICLE PLAX 2D LVIDd:         2.93 cm  Diastology LVIDs:         1.95 cm  LV e' medial:    12.40 cm/s LV PW:         1.62 cm  LV E/e' medial:  8.4 LV IVS:        1.60 cm  LV e' lateral:   14.70 cm/s LVOT diam:     1.90 cm  LV E/e' lateral: 7.1 LV SV:         69 LV SV Index:   35 LVOT Area:     2.84 cm  RIGHT VENTRICLE RV Basal diam:  2.82 cm RV Mid diam:    2.37 cm RV S prime:     24.40 cm/s TAPSE (M-mode): 2.1 cm LEFT ATRIUM             Index       RIGHT ATRIUM           Index LA diam:        3.90 cm 1.97 cm/m  RA Area:     11.80 cm LA Vol (A2C):   44.4 ml 22.44 ml/m RA Volume:   21.10 ml  10.67 ml/m LA Vol (A4C):   32.6 ml 16.48 ml/m LA Biplane Vol:  37.8 ml 19.11 ml/m  AORTIC VALVE AV Area (Vmax):    2.66 cm AV Area (Vmean):   2.48 cm AV Area (VTI):     2.38 cm AV Vmax:           162.00 cm/s AV Vmean:          110.000 cm/s AV VTI:            0.290 m AV Peak Grad:      10.5 mmHg AV Mean Grad:      6.0 mmHg LVOT Vmax:         152.00 cm/s LVOT Vmean:        96.200 cm/s  LVOT VTI:          0.243 m LVOT/AV VTI ratio: 0.84  AORTA Ao Root diam: 3.30 cm MITRAL VALVE MV Area (PHT): 6.22 cm     SHUNTS MV Area VTI:   3.42 cm     Systemic VTI:  0.24 m MV Peak grad:  6.4 mmHg     Systemic Diam: 1.90 cm MV Mean grad:  3.0 mmHg MV Vmax:       1.26 m/s MV Vmean:      75.1 cm/s MV Decel Time: 122 msec MV E velocity: 104.00 cm/s Carlyle Dolly MD Electronically signed by Carlyle Dolly MD Signature Date/Time: 02/10/2021/10:48:49 AM    Final    CT Angio Chest/Abd/Pel for Dissection W and/or W/WO  Result Date: 02/10/2021 CLINICAL DATA:  Chest pain since Wednesday. EXAM: CT ANGIOGRAPHY CHEST, ABDOMEN AND PELVIS TECHNIQUE: Non-contrast CT of the chest was initially obtained. Multidetector CT imaging through the chest, abdomen and pelvis was performed using the standard protocol during bolus administration of intravenous contrast. Multiplanar reconstructed images and MIPs were obtained and reviewed to evaluate the vascular anatomy. CONTRAST:  111m OMNIPAQUE IOHEXOL 350 MG/ML SOLN COMPARISON:  Multiple prior CT scans. FINDINGS: CTA CHEST FINDINGS Cardiovascular: The heart is within normal limits in size. No pericardial effusion. The aorta is normal in caliber. No dissection. No atherosclerotic calcifications. The branch vessels are patent. The pulmonary arteries are grossly normal. Mediastinum/Nodes: No mediastinal or hilar mass or lymphadenopathy. The esophagus is grossly normal. Lungs/Pleura: No acute pulmonary findings. No pulmonary lesions are identified. Very small left pleural effusion with minimal overlying atelectasis. Musculoskeletal: No significant bony findings. Review of the MIP images confirms the above findings. CTA ABDOMEN AND PELVIS FINDINGS VASCULAR Aorta: Normal caliber. No dissection. Distal atherosclerotic calcifications. Celiac: Patent SMA: Patent Renals: Patent IMA: Patent Inflow: Atherosclerotic calcifications but no significant stenosis. Veins: Unremarkable. Review of the  MIP images confirms the above findings. NON-VASCULAR Hepatobiliary: Stable advanced cirrhotic changes involving the liver. No arterial phase enhancing lesions are identified to suggest hepatoma or dysplastic nodules. Portal venous hypertension noted with portal venous collaterals. The gallbladder is grossly normal. No common bile duct dilatation. Pancreas: No mass, inflammation or ductal dilatation. Spleen: Normal size. No focal lesions. Adrenals/Urinary Tract: Adrenal glands and kidneys are grossly normal. No renal lesions or hydronephrosis. The bladder is unremarkable. Stomach/Bowel: Diffuse gastric wall thickening. Similar findings involving the duodenum. Similar appearance on a prior CT scan from 2018 may be due to ascites and low albumin. Gastroduodenitis is a possibility but unlikely. There is also mild colonic wall thickening. No colonic mass or obstruction. Lymphatic: No abdominal or pelvic lymphadenopathy. Reproductive: Surgically absent. Other: Small volume abdominal ascites and diffuse mesenteric edema. Small to moderate amount of free pelvic fluid. Musculoskeletal: No significant bony findings. Review of the MIP images confirms  the above findings. IMPRESSION: 1. Normal thoracic and abdominal aorta. No aneurysm or dissection. 2. Stable advanced cirrhotic changes involving the liver with portal venous hypertension, portal venous collaterals and small volume abdominal ascites. No worrisome hepatic lesions. 3. Diffuse gastric, duodenal and colonic wall thickening likely due to ascites and low albumin related to the patient's cirrhosis. Aortic Atherosclerosis (ICD10-I70.0). Electronically Signed   By: Marijo Sanes M.D.   On: 02/10/2021 12:24   [place] EKG SITE RITE  Result Date: 02/10/2021 If Site Rite image not attached, placement could not be confirmed due to current cardiac rhythm.       Scheduled Meds:  droperidol  1.25 mg Intravenous Once   enoxaparin (LOVENOX) injection  40 mg Subcutaneous Q24H    feeding supplement  237 mL Oral BID BM   levothyroxine  25 mcg Oral Q0600   metoprolol tartrate  25 mg Oral BID   sodium chloride flush  10-40 mL Intracatheter Q12H   Continuous Infusions:  niCARDipine 2.5 mg/hr (02/10/21 0651)   promethazine (PHENERGAN) injection (IM or IVPB) Stopped (02/10/21 0924)     LOS: 0 days    Time spent: 35 minutes    Pricsilla Lindvall Darleen Crocker, DO [facility]  If 7PM-7AM, please contact night-coverage [email or web address] 02/10/2021, 1:14 PM

## 2021-02-10 NOTE — ED Notes (Signed)
Call placed to vascular wellness to request PICC line placement

## 2021-02-10 NOTE — Consult Note (Addendum)
Cardiology Consultation:   Patient ID: [name] MRN: [id number]; DOB: 1961-04-30  Admit date: 02/09/2021 Date of Consult: 02/10/2021  PCP:  No primary care provider on file.   [facility] Providers Cardiologist:  Dorris Carnes, MD        Patient Profile:   [name] is a 60 y.o. 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 Manuella [name].  History of Present Illness:   [name] with no prior CAD, + HTN and hx Hep C with cirrhosis, and normal echo in 2018 presented to ER 02/09/21 with headache and BP found to be 223/128.  Headache bilateral,across temples and neck.  + N&V, light sensitive.  Unable to take her home meds.  Also with blurry vision and double vision.  She was given lopressor and hydralazine and then placed on nicardipine drip. Given fentanyl for headache.  CT head without acute abnormality.   She had K+ of 3.3 on admit and this was treated.  Pt's headache improved with decreasing BP.   She did complain of chest pain and hs troponin 184, 233, 252  Na today 140, K+ 4.0 BUN 29, Cr 1.25 alk phos 287, alb 2.8, AST 43,  Hgb 16.4, WBC 26.3, plts 480   EKG:  The EKG was personally reviewed and demonstrates:  SR with LVH, Ant Q wave old, similar to EKG from 2018 no acute ST elevation. On some EKGs lateral T wave seems peaked.  Telemetry:  Telemetry was personally reviewed and demonstrates:  off tele  Currently BP 139/85 P 125 to 143 afebrile  R 19 Ongoing chest pressure mid sternal across chest and into shoulders some, none into back.  Began Wed and has been constant.   She denies ETOH use. + abd discomfort and nausea. She tells me she feels like she has ascites.   Past Medical History:  Diagnosis Date   Anxiety    Asthma    COPD (chronic obstructive pulmonary disease) (HCC)    Depression    Disc degeneration, lumbar    GERD  (gastroesophageal reflux disease)    Hypertension    Leukocytosis    Lumbar disc disease    Shortness of breath    Thrombocytosis     Past Surgical History:  Procedure Laterality Date   ABDOMINAL HYSTERECTOMY     BIOPSY  03/25/2014   Procedure: GASTRIC BIOPSIES;  Surgeon: Daneil Dolin, MD;  Location: [facility];  Service: Endoscopy;;   BRAIN SURGERY     COLONOSCOPY  02/21/2007   CVE:LFYB papilla and internal hemorrhoids, diminutive rectal polyp/Left-sided diverticula (sigmoid) pedunculated polyps mid descending   COLONOSCOPY  2008   Dr. Gala Romney: segmental biopsy negative for microscopic colitis. Three polyps, one tubular adenoma. Surveillance due 2013.    COLONOSCOPY WITH PROPOFOL N/A 03/25/2014   RMR: Rectal and colonic polyps removed as described above.  Colonic diverticulosis. CT abnormality likely artifactual in orgin     ESOPHAGOGASTRODUODENOSCOPY  08/08/2005   OFB:PZWCHE esophagogastroduodenoscopy.   ESOPHAGOGASTRODUODENOSCOPY  2011   Dr. Gala Romney: normal esophagus, small hiatal hernia, duodenal diverticulum, negative H.pylori and celiac   ESOPHAGOGASTRODUODENOSCOPY (EGD) WITH PROPOFOL N/A 03/25/2014   RMR: Probable occult cervical esophageal web- status post dilation as described above. Abnormal gastic mucosa of uncertain significance-status post gasrtric biopsy   MALONEY DILATION N/A 03/25/2014   Procedure:  MALONEY ESOPHAGEAL DILATION #54 Fr;  Surgeon: Daneil Dolin, MD;  Location: [facility];  Service: Endoscopy;  Laterality: N/A;   POLYPECTOMY  03/25/2014   Procedure: SIGMOID AND RECTAL POLYPECTOMY;  Surgeon: Daneil Dolin, MD;  Location: [facility];  Service: Endoscopy;;   right knee surgery     rt breast biopsy     TOTAL ABDOMINAL HYSTERECTOMY W/ BILATERAL SALPINGOOPHORECTOMY     age 56     Home Medications:  Prior to Admission medications   Medication Sig Start Date End Date Taking? Authorizing Provider  ALPRAZolam Duanne Moron) 1 MG tablet Take 1 mg by mouth 3 (three) times daily as  needed for anxiety.   Yes [provider]  baclofen (LIORESAL) 10 MG tablet Take 10 mg by mouth at bedtime. May take up to 3 times daily for Muscle spasms   Yes [provider]  DULoxetine (CYMBALTA) 30 MG capsule Take 30 mg by mouth daily. 01/09/21  Yes [provider]  DULoxetine (CYMBALTA) 60 MG capsule Take 60 mg by mouth at bedtime.   Yes [provider]  estradiol (ESTRACE) 1 MG tablet Take 1 mg by mouth daily.   Yes [provider]  levothyroxine (SYNTHROID, LEVOTHROID) 25 MCG tablet Take 25 mcg by mouth daily. 10/30/16  Yes [provider]  metoCLOPramide (REGLAN) 5 MG tablet Take 5 mg by mouth 2 (two) times daily.   Yes [provider]  metoprolol tartrate (LOPRESSOR) 25 MG tablet TAKE (1/2) TABLET BY MOUTH TWICE DAILY. Patient taking differently: Take 12.5 mg by mouth 2 (two) times daily. 06/09/19  Yes Fay Records, MD  omeprazole (PRILOSEC) 20 MG capsule Take 20 mg by mouth at bedtime.    Yes [provider]  oxycodone (ROXICODONE) 30 MG immediate release tablet Take 30 mg by mouth 4 (four) times daily as needed. 02/09/21  Yes [provider]  risperiDONE (RISPERDAL) 1 MG tablet Take 1 mg by mouth 2 (two) times daily.   Yes [provider]  venlafaxine (EFFEXOR) 75 MG tablet Take 75 mg by mouth daily.   Yes [provider]  zolpidem (AMBIEN) 10 MG tablet Take 10 mg by mouth at bedtime.     Yes [provider]  AMITIZA 24 MCG capsule TAKE 1 CAPSULE TWICE DAILY WITH MEALS. Patient not taking: Reported on 02/09/2021 01/06/18   Annitta Needs, NP  furosemide (LASIX) 20 MG tablet Take 1 tablet (20 mg total) by mouth daily. Patient not taking: Reported on 02/09/2021 02/06/19   Mahala Menghini, PA-C  nystatin (MYCOSTATIN) 100000 UNIT/ML suspension Take 5 mLs by mouth 4 (four) times daily. Patient not taking: Reported on 02/09/2021    [provider]  oxyCODONE (ROXICODONE) 15 MG immediate  release tablet Take 1-2 tablets by mouth every 4 (four) hours as needed.  Patient not taking: Reported on 02/09/2021 01/28/19   [provider]  promethazine (PHENERGAN) 25 MG tablet Take 25 mg by mouth every 6 (six) hours as needed. 02/03/21   [provider]  spironolactone (ALDACTONE) 50 MG tablet TAKE (1) TABLET BY MOUTH ONCE DAILY. Patient not taking: No sig reported 03/11/19   Carlis Stable, NP    Inpatient Medications: Scheduled Meds:  droperidol  1.25 mg Intravenous Once   enoxaparin (LOVENOX) injection  1 mg/kg Subcutaneous Q12H   feeding supplement  237 mL Oral BID BM   levothyroxine  25 mcg Oral Q0600   metoprolol tartrate  25 mg Oral BID   Continuous  Infusions:  0.9 % NaCl with KCl 40 mEq / L Stopped (02/10/21 2409)   niCARDipine 2.5 mg/hr (02/10/21 0651)   promethazine (PHENERGAN) injection (IM or IVPB)     PRN Meds: ALPRAZolam, ondansetron (ZOFRAN) IV, promethazine (PHENERGAN) injection (IM or IVPB)  Allergies:    Allergies  Allergen Reactions   Codeine Hives and Other (See Comments)    Hallucinations   Latex Hives and Itching   Lyrica [Pregabalin] Hives   Nsaids    Other Hives and Itching    Oral vitamins    Social History:   Social History   Socioeconomic History   Marital status: Single    Spouse name: Not on file   Number of children: Not on file   Years of education: Not on file   Highest education level: Not on file  Occupational History   Not on file  Tobacco Use   Smoking status: Every Day    Packs/day: 1.00    Years: 30.00    Pack years: 30.00    Types: Cigarettes   Smokeless tobacco: Never  Vaping Use   Vaping Use: Never used  Substance and Sexual Activity   Alcohol use: No    Comment: see HPI. Patient denies alcohol use at present   Drug use: No   Sexual activity: Not on file  Other Topics Concern   Not on file  Social History Narrative   Not on file   Social Determinants of Health   Financial Resource Strain: Not  on file  Food Insecurity: Not on file  Transportation Needs: Not on file  Physical Activity: Not on file  Stress: Not on file  Social Connections: Not on file  Intimate Partner Violence: Not on file    Family History:    Family History  Adopted: Yes  Problem Relation Age of Onset   Pancreatic cancer Mother    Hepatitis C Brother    Liver cancer Brother        suicide   Pancreatic cancer Maternal Aunt    Kidney cancer Maternal Uncle    Colon cancer Paternal Aunt    Kidney cancer Paternal Grandfather    Colon cancer Brother 28   Leukemia Sister        1/2 sister   Pancreatic cancer Paternal Grandmother    Pancreatic cancer Maternal Uncle      ROS:  Please see the history of present illness.  General:no colds or fevers, no weight changes Skin:no rashes or ulcers HEENT:+ blurred vision, double vision on arrival no congestion CV:see HPI PUL:see HPI GI:no diarrhea constipation or melena, no indigestion GU:no hematuria, no dysuria MS:no joint pain, no claudication Neuro:no syncope, no lightheadedness Endo:no diabetes, + thyroid disease  All other ROS reviewed and negative.     Physical Exam/Data:   Vitals:   02/10/21 0640 02/10/21 0650 02/10/21 0700 02/10/21 0800  BP: 139/82 139/83 (!) 148/78 (!) 143/81  Pulse:   (!) 125 (!) 121  Resp:      Temp:      TempSrc:      SpO2:   100% 99%  Weight:      Height:        Intake/Output Summary (Last 24 hours) at 02/10/2021 0851 Last data filed at 02/09/2021 2149 Gross per 24 hour  Intake 1044.59 ml  Output --  Net 1044.59 ml   Last 3 Weights 02/09/2021 02/06/2019 05/30/2017  Weight (lbs) 195 lb 186 lb 9.6 oz 180 lb  Weight (kg) 88.451 kg 84.641  kg 81.647 kg     Body mass index is 31.47 kg/m.  General:  Well nourished, well developed, agitated had pulled herself off monitors,  oriented X 3 HEENT: normal Lymph: no adenopathy Neck: no JVD Endocrine:  No thryomegaly Vascular: No carotid bruits; pedal pulses 2+ bilaterally   Cardiac:  normal S1, S2; RRR; no murmur gallup rub or click, when palpating chest wall + chest pain.  Lungs:  clear to auscultation bilaterally, no wheezing, rhonchi or rales  Abd: soft,  mild tenderness but makes her more nauseated with palpation, no hepatomegaly ?ascites Ext: no edema Musculoskeletal:  No deformities, BUE and BLE strength normal and equal Skin: warm and dry  Neuro:  alert and oriented X 3 MAE follows commands, no focal abnormalities noted Psych:  Normal affect   Relevant CV Studies: Echo 05/2017  Study Conclusions   - Left ventricle: The cavity size was normal. Systolic function was    normal. The estimated ejection fraction was in the range of 60%    to 65%. Wall motion was normal; there were no regional wall    motion abnormalities.  - Aortic valve: There was trivial regurgitation.   Laboratory Data:  High Sensitivity Troponin:   Recent Labs  Lab 02/10/21 0004 02/10/21 0340 02/10/21 0654  TROPONINIHS 184* 233* 252*     Chemistry Recent Labs  Lab 02/09/21 1726 02/10/21 0654  NA 139 140  K 3.3* 4.0  CL 109 110  CO2 22 19*  GLUCOSE 137* 131*  BUN 21* 29*  CREATININE 1.09* 1.25*  CALCIUM 8.6* 8.4*  GFRNONAA 59* 50*  ANIONGAP 8 11    Recent Labs  Lab 02/09/21 1726 02/10/21 0654  PROT 6.2* 6.7  ALBUMIN 2.6* 2.8*  AST 33 43*  ALT 17 20  ALKPHOS 284* 287*  BILITOT 1.1 1.1   Hematology Recent Labs  Lab 02/09/21 1726 02/10/21 0654  WBC 21.6* 26.3*  RBC 5.02 5.33*  HGB 15.4* 16.4*  HCT 45.0 47.8*  MCV 89.6 89.7  MCH 30.7 30.8  MCHC 34.2 34.3  RDW 13.8 14.3  PLT 426* 480*   BNPNo results for input(s): BNP, PROBNP in the last 168 hours.  DDimer No results for input(s): DDIMER in the last 168 hours.   Radiology/Studies:  CT Head Wo Contrast  Result Date: 02/09/2021 CLINICAL DATA:  Headache. EXAM: CT HEAD WITHOUT CONTRAST TECHNIQUE: Contiguous axial images were obtained from the base of the skull through the vertex without  intravenous contrast. COMPARISON:  None. FINDINGS: Brain: No evidence of acute infarction, hemorrhage, hydrocephalus, extra-axial collection or mass lesion/mass effect. Vascular: No hyperdense vessel or unexpected calcification. Skull: Normal. Negative for fracture or focal lesion. Sinuses/Orbits: No acute finding. Other: None. IMPRESSION: No acute intracranial abnormality seen. Electronically Signed   By: Marijo Conception M.D.   On: 02/09/2021 19:10   [place] EKG SITE RITE  Result Date: 02/10/2021 If Site Rite image not attached, placement could not be confirmed due to current cardiac rhythm.    Assessment and Plan:   Chest pain in combination with significant HTN and elevated troponins. No hx of CAD.  Agree with plan to check echo, eval wall motion. Will check CXR in pt with severe HTN and chest pain.  Plan for ischemic eval at some point.  Uncontrolled HTN partly due to N&V and unable to take po meds, now with current meds improved  Hx of migraines and presented with severe headache, N&V, now improved with BP control Hx Hep C and cirrhosis/ascites.  And paracentesis in 2018   Abd discomfort will defer to IM. Chronic pain on opioids has not been able to take meds. Per IM. Thrombocytosis/leukocytosis - per Im IV fluids Elevated alk phos.  Has bee elevated in past per Im Hypothyroidism per IM  Tobacco use with COPD counseled on cessation.      Risk Assessment/Risk Scores:     TIMI Risk Score for Unstable Angina or Non-ST Elevation MI:   The patient's TIMI risk score is 3, which indicates a 13% risk of all cause mortality, new or recurrent myocardial infarction or need for urgent revascularization in the next 14 days.    For questions or updates, please contact [place] Please consult [email or web address] for contact info under    Signed, Cecilie Kicks, NP  02/10/2021 8:51 AM  Attending note   Patient seen and discussed with PA Dorene Ar, I agree with her documentation. 60 yo female history of  hep C cirrhosis, COPD, HTN, chronic pain syndrome admitted with headache combined with nausea/vomiting. Due to N/V has not been able to take her home medications including home bp meds.   She reports onset of chest pain Tuesday with SOB. Midchest pressure that has been constant since Tuesday. Worst with position and deep breathing. On Wed began having significant N/V, headache.      WBC 21.6 Hgb 15.4 Plt 426 K 3.3 Cr 1.09 BUN 21 Mg 1.7 Pro calc 0.10 COVID neg Trop 184-->233-->252-->231  CT head: no acute process CXR no acute process EKG sinus tach, LVH, chronic latearl nonspecific ST/T changes   In ER top bp 240/132, started on nicardapine drip currently at 2.90m/hr. Continue nicardapine until more stable oral intake given her N/V   Suspect troponin elevation is secondary to extreme HTN with SBP in the 240s. Chest pain symtoms are not typical for angina, as they are constant x 4 days, worst with position and deep breathing, and she is tender in the epigastrium and midchest to palpation.  EkG without specific ischemic changes. F/u echocardiogram, at this time no plans for ischemic testing.   With marked HTN and chest pain which raise question of possible aortic dissection. Normal CXR. Nursing to check bp's in both arms, will get echo this AM. Low threshold for CTA.  N/V in setting of migraine headache per primary team.  Leukocytosis per primary team.    JCarlyle DollyMD

## 2021-02-10 NOTE — ED Notes (Signed)
Date and time results received: 02/10/21 0755 (use smartphrase ".now" to insert current time)  Test: troponin Critical Value: 252  Name of Provider Notified: Manuella Ghazi  Orders Received? Or Actions Taken?: n/a

## 2021-02-10 NOTE — Progress Notes (Signed)
ANTICOAGULATION CONSULT NOTE - Initial Consult  Pharmacy Consult for enoxaparin Indication: chest pain/ACS  Allergies  Allergen Reactions   Codeine Hives and Other (See Comments)    Hallucinations   Latex Hives and Itching   Lyrica [Pregabalin] Hives   Nsaids    Other Hives and Itching    Oral vitamins    Patient Measurements: Height: '5\' 6"'$  (167.6 cm) Weight: 88.5 kg (195 lb) IBW/kg (Calculated) : 59.3 Heparin Dosing Weight: 78kg  Vital Signs: BP: 184/91 (09/09 0200) Pulse Rate: 119 (09/09 0200)  Labs: Recent Labs    02/09/21 1726 02/10/21 0004  HGB 15.4*  --   HCT 45.0  --   PLT 426*  --   CREATININE 1.09*  --   TROPONINIHS  --  184*    Estimated Creatinine Clearance: 62.3 mL/min (A) (by C-G formula based on SCr of 1.09 mg/dL (H)).   Medical History: Past Medical History:  Diagnosis Date   Anxiety    Asthma    COPD (chronic obstructive pulmonary disease) (HCC)    Depression    Disc degeneration, lumbar    GERD (gastroesophageal reflux disease)    Hypertension    Leukocytosis    Lumbar disc disease    Shortness of breath    Thrombocytosis     Assessment: 60 year old female presents to [facility] with complaints of migrane and hypertension. Chest pain rule out workup shows troponin of 184 with some chest pain noted. Orders received to start full dose lovenox and consult cardiology. CBC appears normal. Patient does not appear to be on blood thinners prior to admit.    Goal of Therapy:  Anti-Xa level 0.6-1 units/ml 4hrs after LMWH dose given Monitor platelets by anticoagulation protocol: Yes   Plan:  Lovenox '90mg'$  q 12 hours CBC q72 hours  Erin Hearing PharmD., BCPS Clinical Pharmacist 02/10/2021 3:59 AM

## 2021-02-11 LAB — COMPREHENSIVE METABOLIC PANEL
ALT: 24 U/L (ref 0–44)
AST: 41 U/L (ref 15–41)
Albumin: 2.1 g/dL — ABNORMAL LOW (ref 3.5–5.0)
Alkaline Phosphatase: 209 U/L — ABNORMAL HIGH (ref 38–126)
Anion gap: 7 (ref 5–15)
BUN: 40 mg/dL — ABNORMAL HIGH (ref 6–20)
CO2: 21 mmol/L — ABNORMAL LOW (ref 22–32)
Calcium: 7.4 mg/dL — ABNORMAL LOW (ref 8.9–10.3)
Chloride: 107 mmol/L (ref 98–111)
Creatinine, Ser: 1.6 mg/dL — ABNORMAL HIGH (ref 0.44–1.00)
GFR, Estimated: 37 mL/min — ABNORMAL LOW (ref >60)
Glucose, Bld: 128 mg/dL — ABNORMAL HIGH (ref 70–99)
Potassium: 3.4 mmol/L — ABNORMAL LOW (ref 3.5–5.1)
Sodium: 135 mmol/L (ref 135–145)
Total Bilirubin: 0.8 mg/dL (ref 0.3–1.2)
Total Protein: 4.8 g/dL — ABNORMAL LOW (ref 6.5–8.1)

## 2021-02-11 LAB — CBC
HCT: 36.5 % (ref 36.0–46.0)
Hemoglobin: 11.9 g/dL — ABNORMAL LOW (ref 12.0–15.0)
MCH: 30.7 pg (ref 26.0–34.0)
MCHC: 32.6 g/dL (ref 30.0–36.0)
MCV: 94.3 fL (ref 80.0–100.0)
Platelets: 336 10*3/uL (ref 150–400)
RBC: 3.87 MIL/uL (ref 3.87–5.11)
RDW: 14.3 % (ref 11.5–15.5)
WBC: 24.5 10*3/uL — ABNORMAL HIGH (ref 4.0–10.5)
nRBC: 0 % (ref 0.0–0.2)

## 2021-02-11 LAB — MAGNESIUM: Magnesium: 1.6 mg/dL — ABNORMAL LOW (ref 1.7–2.4)

## 2021-02-11 MED ORDER — LACTATED RINGERS IV SOLN
INTRAVENOUS | Status: DC
Start: 2021-02-11 — End: 2021-02-12

## 2021-02-11 MED ORDER — AMLODIPINE BESYLATE 5 MG PO TABS
5.0000 mg | ORAL_TABLET | Freq: Every day | ORAL | Status: DC
  Administered 2021-02-11 – 2021-02-13 (×3): 5 mg via ORAL
  Filled 2021-02-11 (×3): qty 1

## 2021-02-11 MED ORDER — POTASSIUM CHLORIDE CRYS ER 20 MEQ PO TBCR
40.0000 meq | EXTENDED_RELEASE_TABLET | Freq: Once | ORAL | Status: AC
  Administered 2021-02-11: 40 meq via ORAL
  Filled 2021-02-11: qty 2

## 2021-02-11 MED ORDER — MAGNESIUM SULFATE 2 GM/50ML IV SOLN
2.0000 g | Freq: Once | INTRAVENOUS | Status: AC
  Administered 2021-02-11: 2 g via INTRAVENOUS
  Filled 2021-02-11: qty 50

## 2021-02-11 NOTE — Progress Notes (Signed)
PROGRESS NOTE    [name]  [id number] DOB: 1960-07-27 DOA: 02/09/2021 PCP: No primary care provider on file.   Brief Narrative:   [name] is a 60 y.o. female with medical history significant for hypertension, hypothyroidism, GERD, anxiety, depression, chronic pain syndrome and tobacco abuse who presents to the emergency department due to onset of headache which was bilateral across temples and neck, this was associated with nausea and vomiting, headache worsens with lights on.  She has not been able to take her home medications since onset of symptoms due to vomiting, she states that she vomited blood pressure medication that she took yesterday and that she has not been able to tolerate any oral intake due to vomiting.   Assessment & Plan:   Active Problems:   Nausea with vomiting   Leukocytosis   Thrombocytosis   Hypertensive urgency   Chronic pain   Hypothyroidism   Migraine headache   Dehydration   Hypokalemia   Hypoalbuminemia due to protein-calorie malnutrition (HCC)   Elevated alkaline phosphatase in newborn   Tobacco abuse   Hypertensive crisis   Symptomatic hypertensive crisis-improvning -Cardene drip has been weaned off -Transfer to tele -Amlodipine '5mg'$  daily -Restarted home metoprolol at higher dose of 25 mg twice daily -Plan to start other oral medications such as ACE inhibitor's and CCB's prior to discharge -CTA negative for dissection   Intractable nausea and vomiting-improving -Possibly related to migraine headache versus gastroenteritis -Zofran and Phenergan prescribed as needed -Restart home Reglan, may have been using due to some gastroparesis  AKI -Follow I/Os -IVF -Avoid nephrotoxic agents   Troponin elevation secondary to above -Troponin levels flat -Appreciate cardiology evaluation -2D echocardiogram without any new wall motion abnormalities   Possible opioid withdrawal symptoms -In the setting of chronic pain with  oxycodone use -Plan to resume medication as tolerated   Leukocytosis -Likely stress reaction to above as well as some hemoconcentration -Procalcitonin low -Continue to monitor   Hypothyroidism -Continue Synthroid   Anxiety/depression -Continue Xanax, Cymbalta, and Effexor   Tobacco abuse -Counseled on cessation     DVT prophylaxis:Lovenox Code Status: Full Family Communication: None at bedside Disposition Plan:  Status is: Inpatient   Remains inpatient appropriate because:Hemodynamically unstable, IV treatments appropriate due to intensity of illness or inability to take PO, and Inpatient level of care appropriate due to severity of illness   Dispo: The patient is from: Home              Anticipated d/c is to: Home              Patient currently is not medically stable to d/c.              Difficult to place patient No     Consultants:  Cardiology   Procedures:  See below   Antimicrobials:  None   Subjective: Patient seen and evaluated today with continued nausea, but no vomiting. No acute concerns or events noted overnight.  Blood pressure is better controlled today and she is off Cardene drip.  Objective: Vitals:   02/11/21 0744 02/11/21 0800 02/11/21 0900 02/11/21 1000  BP:  121/70 (!) 162/74 (!) 187/85  Pulse: 83 77 93 90  Resp: (!) 26 (!) '21 17 20  '$ Temp: 97.9 F (36.6 C)     TempSrc: Oral     SpO2: 94% 97% 97% 94%  Weight:      Height:        Intake/Output Summary (Last 24 hours)  at 02/11/2021 1107 Last data filed at 02/11/2021 0800 Gross per 24 hour  Intake 2023.67 ml  Output 550 ml  Net 1473.67 ml   Filed Weights   02/09/21 1551  Weight: 88.5 kg    Examination:  General exam: Appears calm and comfortable  Respiratory system: Clear to auscultation. Respiratory effort normal. Cardiovascular system: S1 & S2 heard, RRR.  Gastrointestinal system: Abdomen is soft Central nervous system: Alert and awake Extremities: No edema Skin: No  significant lesions noted Psychiatry: Flat affect.    Data Reviewed: I have personally reviewed following labs and imaging studies  CBC: Recent Labs  Lab 02/09/21 1726 02/10/21 0654 02/11/21 0306  WBC 21.6* 26.3* 24.5*  NEUTROABS 14.0*  --   --   HGB 15.4* 16.4* 11.9*  HCT 45.0 47.8* 36.5  MCV 89.6 89.7 94.3  PLT 426* 480* [id number]   Basic Metabolic Panel: Recent Labs  Lab 02/09/21 1726 02/10/21 0654 02/11/21 0306  NA 139 140 135  K 3.3* 4.0 3.4*  CL 109 110 107  CO2 22 19* 21*  GLUCOSE 137* 131* 128*  BUN 21* 29* 40*  CREATININE 1.09* 1.25* 1.60*  CALCIUM 8.6* 8.4* 7.4*  MG  --  1.7 1.6*  PHOS  --  2.9  --    GFR: Estimated Creatinine Clearance: 42.4 mL/min (A) (by C-G formula based on SCr of 1.6 mg/dL (H)). Liver Function Tests: Recent Labs  Lab 02/09/21 1726 02/10/21 0654 02/11/21 0306  AST 33 43* 41  ALT '17 20 24  '$ ALKPHOS 284* 287* 209*  BILITOT 1.1 1.1 0.8  PROT 6.2* 6.7 4.8*  ALBUMIN 2.6* 2.8* 2.1*   Recent Labs  Lab 02/09/21 1726  LIPASE 28   No results for input(s): AMMONIA in the last 168 hours. Coagulation Profile: Recent Labs  Lab 02/10/21 0654  INR 1.1   Cardiac Enzymes: No results for input(s): CKTOTAL, CKMB, CKMBINDEX, TROPONINI in the last 168 hours. BNP (last 3 results) No results for input(s): PROBNP in the last 8760 hours. HbA1C: No results for input(s): HGBA1C in the last 72 hours. CBG: No results for input(s): GLUCAP in the last 168 hours. Lipid Profile: No results for input(s): CHOL, HDL, LDLCALC, TRIG, CHOLHDL, LDLDIRECT in the last 72 hours. Thyroid Function Tests: No results for input(s): TSH, T4TOTAL, FREET4, T3FREE, THYROIDAB in the last 72 hours. Anemia Panel: No results for input(s): VITAMINB12, FOLATE, FERRITIN, TIBC, IRON, RETICCTPCT in the last 72 hours. Sepsis Labs: Recent Labs  Lab 02/10/21 0654  PROCALCITON 0.10    Recent Results (from the past 240 hour(s))  Resp Panel by RT-PCR (Flu A&B, Covid)  Nasopharyngeal Swab     Status: None   Collection Time: 02/09/21 10:11 PM   Specimen: Nasopharyngeal Swab; Nasopharyngeal(NP) swabs in vial transport medium  Result Value Ref Range Status   SARS Coronavirus 2 by RT PCR NEGATIVE NEGATIVE Final    Comment: (NOTE) SARS-CoV-2 target nucleic acids are NOT DETECTED.  The SARS-CoV-2 RNA is generally detectable in upper respiratory specimens during the acute phase of infection. The lowest concentration of SARS-CoV-2 viral copies this assay can detect is 138 copies/mL. A negative result does not preclude SARS-Cov-2 infection and should not be used as the sole basis for treatment or other patient management decisions. A negative result may occur with  improper specimen collection/handling, submission of specimen other than nasopharyngeal swab, presence of viral mutation(s) within the areas targeted by this assay, and inadequate number of viral copies(<138 copies/mL). A negative result must be combined  with clinical observations, patient history, and epidemiological information. The expected result is Negative.  Fact Sheet for Patients:  EntrepreneurPulse.com.au  Fact Sheet for Healthcare Providers:  IncredibleEmployment.be  This test is no t yet approved or cleared by the [place] FDA and  has been authorized for detection and/or diagnosis of SARS-CoV-2 by FDA under an Emergency Use Authorization (EUA). This EUA will remain  in effect (meaning this test can be used) for the duration of the COVID-19 declaration under Section 564(b)(1) of the Act, 21 U.S.C.section 360bbb-3(b)(1), unless the authorization is terminated  or revoked sooner.       Influenza A by PCR NEGATIVE NEGATIVE Final   Influenza B by PCR NEGATIVE NEGATIVE Final    Comment: (NOTE) The Xpert Xpress SARS-CoV-2/FLU/RSV plus assay is intended as an aid in the diagnosis of influenza from Nasopharyngeal swab specimens and should not be  used as a sole basis for treatment. Nasal washings and aspirates are unacceptable for Xpert Xpress SARS-CoV-2/FLU/RSV testing.  Fact Sheet for Patients: EntrepreneurPulse.com.au  Fact Sheet for Healthcare Providers: IncredibleEmployment.be  This test is not yet approved or cleared by the [place] FDA and has been authorized for detection and/or diagnosis of SARS-CoV-2 by FDA under an Emergency Use Authorization (EUA). This EUA will remain in effect (meaning this test can be used) for the duration of the COVID-19 declaration under Section 564(b)(1) of the Act, 21 U.S.C. section 360bbb-3(b)(1), unless the authorization is terminated or revoked.  Performed at [facility], [address], [place], [place] [address]   MRSA Next Gen by PCR, Nasal     Status: None   Collection Time: 02/10/21  2:00 PM   Specimen: Nasal Mucosa; Nasal Swab  Result Value Ref Range Status   MRSA by PCR Next Gen NOT DETECTED NOT DETECTED Final    Comment: (NOTE) The GeneXpert MRSA Assay (FDA approved for NASAL specimens only), is one component of a comprehensive MRSA colonization surveillance program. It is not intended to diagnose MRSA infection nor to guide or monitor treatment for MRSA infections. Test performance is not FDA approved in patients less than 53 years old. Performed at [facility], [address] [place], [place] [address]          Radiology Studies: CT Head Wo Contrast  Result Date: 02/09/2021 CLINICAL DATA:  Headache. EXAM: CT HEAD WITHOUT CONTRAST TECHNIQUE: Contiguous axial images were obtained from the base of the skull through the vertex without intravenous contrast. COMPARISON:  None. FINDINGS: Brain: No evidence of acute infarction, hemorrhage, hydrocephalus, extra-axial collection or mass lesion/mass effect. Vascular: No hyperdense vessel or unexpected calcification. Skull: Normal. Negative for fracture or focal lesion. Sinuses/Orbits: No  acute finding. Other: None. IMPRESSION: No acute intracranial abnormality seen. Electronically Signed   By: Marijo Conception M.D.   On: 02/09/2021 19:10   DG CHEST PORT 1 VIEW  Result Date: 02/10/2021 CLINICAL DATA:  Chest pain. EXAM: PORTABLE CHEST 1 VIEW COMPARISON:  February 24, 2015. FINDINGS: The heart size and mediastinal contours are within normal limits. Both lungs are clear. The visualized skeletal structures are unremarkable. IMPRESSION: No active disease. Electronically Signed   By: Marijo Conception M.D.   On: 02/10/2021 08:50   ECHOCARDIOGRAM COMPLETE  Result Date: 02/10/2021    ECHOCARDIOGRAM REPORT   Patient Name:   Virgia Land Date of Exam: 02/10/2021 Medical Rec #:  [id number]         Height:       66.0 in Accession #:  [id number]        Weight:       195.0 lb Date of Birth:  01/13/1961         BSA:          1.978 m Patient Age:    26 years          BP:           148/78 mmHg Patient Gender: F                 HR:           122 bpm. Exam Location:  [facility] Procedure: 2D Echo, Cardiac Doppler and Color Doppler Indications:    Elevated Troponin  History:        Patient has prior history of Echocardiogram examinations, most                 recent 05/05/2017. Arrythmias:Tachycardia; Risk                 Factors:Hypertension and Current Smoker.  Sonographer:    Wenda Low Referring Phys: [id number] [place]  1. Left ventricular ejection fraction, by estimation, is 65 to 70%. The left ventricle has normal function. The left ventricle has no regional wall motion abnormalities. There is severe left ventricular hypertrophy. Left ventricular diastolic parameters  are indeterminate.  2. Right ventricular systolic function is normal. The right ventricular size is normal. Tricuspid regurgitation signal is inadequate for assessing PA pressure.  3. The pericardial effusion is circumferential.  4. The mitral valve is normal in structure. No evidence of mitral valve regurgitation. No  evidence of mitral stenosis.  5. The aortic valve is tricuspid. Aortic valve regurgitation is not visualized. No aortic stenosis is present.  6. IVC is small, suggesting low RA pressure and hypovolemia. FINDINGS  Left Ventricle: Left ventricular ejection fraction, by estimation, is 65 to 70%. The left ventricle has normal function. The left ventricle has no regional wall motion abnormalities. The left ventricular internal cavity size was normal in size. There is  severe left ventricular hypertrophy. Left ventricular diastolic parameters are indeterminate. Right Ventricle: The right ventricular size is normal. No increase in right ventricular wall thickness. Right ventricular systolic function is normal. Tricuspid regurgitation signal is inadequate for assessing PA pressure. Left Atrium: Left atrial size was normal in size. Right Atrium: Right atrial size was normal in size. Pericardium: Trivial pericardial effusion is present. The pericardial effusion is circumferential. Mitral Valve: The mitral valve is normal in structure. No evidence of mitral valve regurgitation. No evidence of mitral valve stenosis. MV peak gradient, 6.4 mmHg. The mean mitral valve gradient is 3.0 mmHg. Tricuspid Valve: The tricuspid valve is normal in structure. Tricuspid valve regurgitation is not demonstrated. No evidence of tricuspid stenosis. Aortic Valve: The aortic valve is tricuspid. Aortic valve regurgitation is not visualized. No aortic stenosis is present. Aortic valve mean gradient measures 6.0 mmHg. Aortic valve peak gradient measures 10.5 mmHg. Aortic valve area, by VTI measures 2.38  cm. Pulmonic Valve: The pulmonic valve was not well visualized. Pulmonic valve regurgitation is not visualized. No evidence of pulmonic stenosis. Aorta: The aortic root is normal in size and structure. Venous: IVC is small, suggesting low RA pressure and hypovolemia. IAS/Shunts: No atrial level shunt detected by color flow Doppler.  LEFT VENTRICLE  PLAX 2D LVIDd:         2.93 cm  Diastology LVIDs:         1.95 cm  LV e' medial:    12.40 cm/s LV PW:         1.62 cm  LV E/e' medial:  8.4 LV IVS:        1.60 cm  LV e' lateral:   14.70 cm/s LVOT diam:     1.90 cm  LV E/e' lateral: 7.1 LV SV:         69 LV SV Index:   35 LVOT Area:     2.84 cm  RIGHT VENTRICLE RV Basal diam:  2.82 cm RV Mid diam:    2.37 cm RV S prime:     24.40 cm/s TAPSE (M-mode): 2.1 cm LEFT ATRIUM             Index       RIGHT ATRIUM           Index LA diam:        3.90 cm 1.97 cm/m  RA Area:     11.80 cm LA Vol (A2C):   44.4 ml 22.44 ml/m RA Volume:   21.10 ml  10.67 ml/m LA Vol (A4C):   32.6 ml 16.48 ml/m LA Biplane Vol: 37.8 ml 19.11 ml/m  AORTIC VALVE AV Area (Vmax):    2.66 cm AV Area (Vmean):   2.48 cm AV Area (VTI):     2.38 cm AV Vmax:           162.00 cm/s AV Vmean:          110.000 cm/s AV VTI:            0.290 m AV Peak Grad:      10.5 mmHg AV Mean Grad:      6.0 mmHg LVOT Vmax:         152.00 cm/s LVOT Vmean:        96.200 cm/s LVOT VTI:          0.243 m LVOT/AV VTI ratio: 0.84  AORTA Ao Root diam: 3.30 cm MITRAL VALVE MV Area (PHT): 6.22 cm     SHUNTS MV Area VTI:   3.42 cm     Systemic VTI:  0.24 m MV Peak grad:  6.4 mmHg     Systemic Diam: 1.90 cm MV Mean grad:  3.0 mmHg MV Vmax:       1.26 m/s MV Vmean:      75.1 cm/s MV Decel Time: 122 msec MV E velocity: 104.00 cm/s Carlyle Dolly MD Electronically signed by Carlyle Dolly MD Signature Date/Time: 02/10/2021/10:48:49 AM    Final    CT Angio Chest/Abd/Pel for Dissection W and/or W/WO  Result Date: 02/10/2021 CLINICAL DATA:  Chest pain since Wednesday. EXAM: CT ANGIOGRAPHY CHEST, ABDOMEN AND PELVIS TECHNIQUE: Non-contrast CT of the chest was initially obtained. Multidetector CT imaging through the chest, abdomen and pelvis was performed using the standard protocol during bolus administration of intravenous contrast. Multiplanar reconstructed images and MIPs were obtained and reviewed to evaluate the vascular  anatomy. CONTRAST:  176m OMNIPAQUE IOHEXOL 350 MG/ML SOLN COMPARISON:  Multiple prior CT scans. FINDINGS: CTA CHEST FINDINGS Cardiovascular: The heart is within normal limits in size. No pericardial effusion. The aorta is normal in caliber. No dissection. No atherosclerotic calcifications. The branch vessels are patent. The pulmonary arteries are grossly normal. Mediastinum/Nodes: No mediastinal or hilar mass or lymphadenopathy. The esophagus is grossly normal. Lungs/Pleura: No acute pulmonary findings. No pulmonary lesions are identified. Very small left pleural effusion with minimal overlying atelectasis. Musculoskeletal: No significant bony findings. Review of the MIP  images confirms the above findings. CTA ABDOMEN AND PELVIS FINDINGS VASCULAR Aorta: Normal caliber. No dissection. Distal atherosclerotic calcifications. Celiac: Patent SMA: Patent Renals: Patent IMA: Patent Inflow: Atherosclerotic calcifications but no significant stenosis. Veins: Unremarkable. Review of the MIP images confirms the above findings. NON-VASCULAR Hepatobiliary: Stable advanced cirrhotic changes involving the liver. No arterial phase enhancing lesions are identified to suggest hepatoma or dysplastic nodules. Portal venous hypertension noted with portal venous collaterals. The gallbladder is grossly normal. No common bile duct dilatation. Pancreas: No mass, inflammation or ductal dilatation. Spleen: Normal size. No focal lesions. Adrenals/Urinary Tract: Adrenal glands and kidneys are grossly normal. No renal lesions or hydronephrosis. The bladder is unremarkable. Stomach/Bowel: Diffuse gastric wall thickening. Similar findings involving the duodenum. Similar appearance on a prior CT scan from 2018 may be due to ascites and low albumin. Gastroduodenitis is a possibility but unlikely. There is also mild colonic wall thickening. No colonic mass or obstruction. Lymphatic: No abdominal or pelvic lymphadenopathy. Reproductive: Surgically  absent. Other: Small volume abdominal ascites and diffuse mesenteric edema. Small to moderate amount of free pelvic fluid. Musculoskeletal: No significant bony findings. Review of the MIP images confirms the above findings. IMPRESSION: 1. Normal thoracic and abdominal aorta. No aneurysm or dissection. 2. Stable advanced cirrhotic changes involving the liver with portal venous hypertension, portal venous collaterals and small volume abdominal ascites. No worrisome hepatic lesions. 3. Diffuse gastric, duodenal and colonic wall thickening likely due to ascites and low albumin related to the patient's cirrhosis. Aortic Atherosclerosis (ICD10-I70.0). Electronically Signed   By: Marijo Sanes M.D.   On: 02/10/2021 12:24   [place] EKG SITE RITE  Result Date: 02/10/2021 If Site Rite image not attached, placement could not be confirmed due to current cardiac rhythm.       Scheduled Meds:  amLODipine  5 mg Oral Daily   baclofen  10 mg Oral QHS   Chlorhexidine Gluconate Cloth  6 each Topical Daily   droperidol  1.25 mg Intravenous Once   DULoxetine  60 mg Oral QHS   enoxaparin (LOVENOX) injection  40 mg Subcutaneous Q24H   feeding supplement  237 mL Oral BID BM   levothyroxine  25 mcg Oral Q0600   metoCLOPramide  5 mg Oral BID   metoprolol tartrate  25 mg Oral BID   sodium chloride flush  10-40 mL Intracatheter Q12H   venlafaxine  75 mg Oral Daily   Continuous Infusions:  lactated ringers     niCARDipine Stopped (02/11/21 0404)   promethazine (PHENERGAN) injection (IM or IVPB) Stopped (02/10/21 0922)     LOS: 1 day    Time spent: 35 minutes    Amarion Portell Darleen Crocker, DO [facility]  If 7PM-7AM, please contact night-coverage [email or web address] 02/11/2021, 11:07 AM

## 2021-02-12 LAB — BASIC METABOLIC PANEL
Anion gap: 5 (ref 5–15)
BUN: 35 mg/dL — ABNORMAL HIGH (ref 6–20)
CO2: 22 mmol/L (ref 22–32)
Calcium: 7.3 mg/dL — ABNORMAL LOW (ref 8.9–10.3)
Chloride: 106 mmol/L (ref 98–111)
Creatinine, Ser: 1.55 mg/dL — ABNORMAL HIGH (ref 0.44–1.00)
GFR, Estimated: 38 mL/min — ABNORMAL LOW (ref >60)
Glucose, Bld: 108 mg/dL — ABNORMAL HIGH (ref 70–99)
Potassium: 3.9 mmol/L (ref 3.5–5.1)
Sodium: 133 mmol/L — ABNORMAL LOW (ref 135–145)

## 2021-02-12 LAB — MAGNESIUM: Magnesium: 2.3 mg/dL (ref 1.7–2.4)

## 2021-02-12 MED ORDER — SODIUM CHLORIDE 0.9 % IV SOLN: INTRAVENOUS | Status: DC

## 2021-02-12 MED ORDER — POLYETHYLENE GLYCOL 3350 17 G PO PACK
17.0000 g | PACK | Freq: Every day | ORAL | Status: DC
  Administered 2021-02-12: 17 g via ORAL
  Filled 2021-02-12: qty 1

## 2021-02-12 NOTE — Progress Notes (Signed)
PROGRESS NOTE    [name]  [id number] DOB: 1960/09/01 DOA: 02/09/2021 PCP: No primary care provider on file.   Brief Narrative:   [name] is a 60 y.o. female with medical history significant for hypertension, hypothyroidism, GERD, anxiety, depression, chronic pain syndrome and tobacco abuse who presents to the emergency department due to onset of headache which was bilateral across temples and neck, this was associated with nausea and vomiting, headache worsens with lights on.  She has not been able to take her home medications since onset of symptoms due to vomiting, she states that she vomited blood pressure medication that she took yesterday and that she has not been able to tolerate any oral intake due to vomiting.   Assessment & Plan:   Active Problems:   Nausea with vomiting   Leukocytosis   Thrombocytosis   Hypertensive urgency   Chronic pain   Hypothyroidism   Migraine headache   Dehydration   Hypokalemia   Hypoalbuminemia due to protein-calorie malnutrition (HCC)   Elevated alkaline phosphatase in newborn   Tobacco abuse   Hypertensive crisis   Symptomatic hypertensive crisis-resolved -Cardene drip has been weaned off -Transfer to tele -Amlodipine '5mg'$  daily -Restarted home metoprolol at higher dose of 25 mg twice daily -CTA negative for dissection   Intractable nausea and vomiting-improving -Possibly related to migraine headache versus gastroenteritis -Zofran and Phenergan prescribed as needed -Restart home Reglan, may have been using due to some gastroparesis   AKI-improving -Follow I/Os -IVF -Avoid nephrotoxic agents   Troponin elevation secondary to above -Troponin levels flat -Appreciate cardiology evaluation -2D echocardiogram without any new wall motion abnormalities   Possible opioid withdrawal symptoms -In the setting of chronic pain with oxycodone use -Plan to resume medication as tolerated   Leukocytosis -Likely stress  reaction to above as well as some hemoconcentration -Procalcitonin low -Continue to monitor   Hypothyroidism -Continue Synthroid   Anxiety/depression -Continue Xanax, Cymbalta, and Effexor   Tobacco abuse -Counseled on cessation     DVT prophylaxis:Lovenox Code Status: Full Family Communication: None at bedside Disposition Plan:  Status is: Inpatient   Remains inpatient appropriate because:Hemodynamically unstable, IV treatments appropriate due to intensity of illness or inability to take PO, and Inpatient level of care appropriate due to severity of illness   Dispo: The patient is from: Home              Anticipated d/c is to: Home              Patient currently is not medically stable to d/c.              Difficult to place patient No     Consultants:  Cardiology   Procedures:  See below   Antimicrobials:  None   Subjective: Patient seen and evaluated today with ongoing headache and some mild nausea.  She states that she is starting to feel somewhat better and her blood pressures are becoming more controlled.  Objective: Vitals:   02/12/21 0500 02/12/21 0600 02/12/21 0700 02/12/21 0900  BP: (!) 119/56 119/65 (!) 116/57   Pulse: 67 69 69   Resp: '13 13 12   '$ Temp:    98 F (36.7 C)  TempSrc:      SpO2: 94% 93% 92%   Weight:      Height:        Intake/Output Summary (Last 24 hours) at 02/12/2021 1026 Last data filed at 02/12/2021 0615 Gross per 24 hour  Intake 2191.87  ml  Output 750 ml  Net 1441.87 ml   Filed Weights   02/09/21 1551  Weight: 88.5 kg    Examination:  General exam: Appears calm and comfortable  Respiratory system: Clear to auscultation. Respiratory effort normal. Cardiovascular system: S1 & S2 heard, RRR.  Gastrointestinal system: Abdomen is soft Central nervous system: Alert and awake Extremities: No edema Skin: No significant lesions noted Psychiatry: Flat affect.    Data Reviewed: I have personally reviewed following labs  and imaging studies  CBC: Recent Labs  Lab 02/09/21 1726 02/10/21 0654 02/11/21 0306  WBC 21.6* 26.3* 24.5*  NEUTROABS 14.0*  --   --   HGB 15.4* 16.4* 11.9*  HCT 45.0 47.8* 36.5  MCV 89.6 89.7 94.3  PLT 426* 480* [id number]   Basic Metabolic Panel: Recent Labs  Lab 02/09/21 1726 02/10/21 0654 02/11/21 0306 02/12/21 0407  NA 139 140 135 133*  K 3.3* 4.0 3.4* 3.9  CL 109 110 107 106  CO2 22 19* 21* 22  GLUCOSE 137* 131* 128* 108*  BUN 21* 29* 40* 35*  CREATININE 1.09* 1.25* 1.60* 1.55*  CALCIUM 8.6* 8.4* 7.4* 7.3*  MG  --  1.7 1.6* 2.3  PHOS  --  2.9  --   --    GFR: Estimated Creatinine Clearance: 43.8 mL/min (A) (by C-G formula based on SCr of 1.55 mg/dL (H)). Liver Function Tests: Recent Labs  Lab 02/09/21 1726 02/10/21 0654 02/11/21 0306  AST 33 43* 41  ALT '17 20 24  '$ ALKPHOS 284* 287* 209*  BILITOT 1.1 1.1 0.8  PROT 6.2* 6.7 4.8*  ALBUMIN 2.6* 2.8* 2.1*   Recent Labs  Lab 02/09/21 1726  LIPASE 28   No results for input(s): AMMONIA in the last 168 hours. Coagulation Profile: Recent Labs  Lab 02/10/21 0654  INR 1.1   Cardiac Enzymes: No results for input(s): CKTOTAL, CKMB, CKMBINDEX, TROPONINI in the last 168 hours. BNP (last 3 results) No results for input(s): PROBNP in the last 8760 hours. HbA1C: No results for input(s): HGBA1C in the last 72 hours. CBG: No results for input(s): GLUCAP in the last 168 hours. Lipid Profile: No results for input(s): CHOL, HDL, LDLCALC, TRIG, CHOLHDL, LDLDIRECT in the last 72 hours. Thyroid Function Tests: No results for input(s): TSH, T4TOTAL, FREET4, T3FREE, THYROIDAB in the last 72 hours. Anemia Panel: No results for input(s): VITAMINB12, FOLATE, FERRITIN, TIBC, IRON, RETICCTPCT in the last 72 hours. Sepsis Labs: Recent Labs  Lab 02/10/21 0654  PROCALCITON 0.10    Recent Results (from the past 240 hour(s))  Resp Panel by RT-PCR (Flu A&B, Covid) Nasopharyngeal Swab     Status: None   Collection Time:  02/09/21 10:11 PM   Specimen: Nasopharyngeal Swab; Nasopharyngeal(NP) swabs in vial transport medium  Result Value Ref Range Status   SARS Coronavirus 2 by RT PCR NEGATIVE NEGATIVE Final    Comment: (NOTE) SARS-CoV-2 target nucleic acids are NOT DETECTED.  The SARS-CoV-2 RNA is generally detectable in upper respiratory specimens during the acute phase of infection. The lowest concentration of SARS-CoV-2 viral copies this assay can detect is 138 copies/mL. A negative result does not preclude SARS-Cov-2 infection and should not be used as the sole basis for treatment or other patient management decisions. A negative result may occur with  improper specimen collection/handling, submission of specimen other than nasopharyngeal swab, presence of viral mutation(s) within the areas targeted by this assay, and inadequate number of viral copies(<138 copies/mL). A negative result must be combined with clinical  observations, patient history, and epidemiological information. The expected result is Negative.  Fact Sheet for Patients:  EntrepreneurPulse.com.au  Fact Sheet for Healthcare Providers:  IncredibleEmployment.be  This test is no t yet approved or cleared by the [place] FDA and  has been authorized for detection and/or diagnosis of SARS-CoV-2 by FDA under an Emergency Use Authorization (EUA). This EUA will remain  in effect (meaning this test can be used) for the duration of the COVID-19 declaration under Section 564(b)(1) of the Act, 21 U.S.C.section 360bbb-3(b)(1), unless the authorization is terminated  or revoked sooner.       Influenza A by PCR NEGATIVE NEGATIVE Final   Influenza B by PCR NEGATIVE NEGATIVE Final    Comment: (NOTE) The Xpert Xpress SARS-CoV-2/FLU/RSV plus assay is intended as an aid in the diagnosis of influenza from Nasopharyngeal swab specimens and should not be used as a sole basis for treatment. Nasal washings  and aspirates are unacceptable for Xpert Xpress SARS-CoV-2/FLU/RSV testing.  Fact Sheet for Patients: EntrepreneurPulse.com.au  Fact Sheet for Healthcare Providers: IncredibleEmployment.be  This test is not yet approved or cleared by the [place] FDA and has been authorized for detection and/or diagnosis of SARS-CoV-2 by FDA under an Emergency Use Authorization (EUA). This EUA will remain in effect (meaning this test can be used) for the duration of the COVID-19 declaration under Section 564(b)(1) of the Act, 21 U.S.C. section 360bbb-3(b)(1), unless the authorization is terminated or revoked.  Performed at [facility], [address], [place], [place] [address]   MRSA Next Gen by PCR, Nasal     Status: None   Collection Time: 02/10/21  2:00 PM   Specimen: Nasal Mucosa; Nasal Swab  Result Value Ref Range Status   MRSA by PCR Next Gen NOT DETECTED NOT DETECTED Final    Comment: (NOTE) The GeneXpert MRSA Assay (FDA approved for NASAL specimens only), is one component of a comprehensive MRSA colonization surveillance program. It is not intended to diagnose MRSA infection nor to guide or monitor treatment for MRSA infections. Test performance is not FDA approved in patients less than 69 years old. Performed at [facility], [address] [place],  [address]          Radiology Studies: CT Angio Chest/Abd/Pel for Dissection W and/or W/WO  Result Date: 02/10/2021 CLINICAL DATA:  Chest pain since Wednesday. EXAM: CT ANGIOGRAPHY CHEST, ABDOMEN AND PELVIS TECHNIQUE: Non-contrast CT of the chest was initially obtained. Multidetector CT imaging through the chest, abdomen and pelvis was performed using the standard protocol during bolus administration of intravenous contrast. Multiplanar reconstructed images and MIPs were obtained and reviewed to evaluate the vascular anatomy. CONTRAST:  114m OMNIPAQUE IOHEXOL 350 MG/ML SOLN COMPARISON:   Multiple prior CT scans. FINDINGS: CTA CHEST FINDINGS Cardiovascular: The heart is within normal limits in size. No pericardial effusion. The aorta is normal in caliber. No dissection. No atherosclerotic calcifications. The branch vessels are patent. The pulmonary arteries are grossly normal. Mediastinum/Nodes: No mediastinal or hilar mass or lymphadenopathy. The esophagus is grossly normal. Lungs/Pleura: No acute pulmonary findings. No pulmonary lesions are identified. Very small left pleural effusion with minimal overlying atelectasis. Musculoskeletal: No significant bony findings. Review of the MIP images confirms the above findings. CTA ABDOMEN AND PELVIS FINDINGS VASCULAR Aorta: Normal caliber. No dissection. Distal atherosclerotic calcifications. Celiac: Patent SMA: Patent Renals: Patent IMA: Patent Inflow: Atherosclerotic calcifications but no significant stenosis. Veins: Unremarkable. Review of the MIP images confirms the above findings. NON-VASCULAR Hepatobiliary: Stable advanced cirrhotic changes  involving the liver. No arterial phase enhancing lesions are identified to suggest hepatoma or dysplastic nodules. Portal venous hypertension noted with portal venous collaterals. The gallbladder is grossly normal. No common bile duct dilatation. Pancreas: No mass, inflammation or ductal dilatation. Spleen: Normal size. No focal lesions. Adrenals/Urinary Tract: Adrenal glands and kidneys are grossly normal. No renal lesions or hydronephrosis. The bladder is unremarkable. Stomach/Bowel: Diffuse gastric wall thickening. Similar findings involving the duodenum. Similar appearance on a prior CT scan from 2018 may be due to ascites and low albumin. Gastroduodenitis is a possibility but unlikely. There is also mild colonic wall thickening. No colonic mass or obstruction. Lymphatic: No abdominal or pelvic lymphadenopathy. Reproductive: Surgically absent. Other: Small volume abdominal ascites and diffuse mesenteric edema.  Small to moderate amount of free pelvic fluid. Musculoskeletal: No significant bony findings. Review of the MIP images confirms the above findings. IMPRESSION: 1. Normal thoracic and abdominal aorta. No aneurysm or dissection. 2. Stable advanced cirrhotic changes involving the liver with portal venous hypertension, portal venous collaterals and small volume abdominal ascites. No worrisome hepatic lesions. 3. Diffuse gastric, duodenal and colonic wall thickening likely due to ascites and low albumin related to the patient's cirrhosis. Aortic Atherosclerosis (ICD10-I70.0). Electronically Signed   By: Marijo Sanes M.D.   On: 02/10/2021 12:24        Scheduled Meds:  amLODipine  5 mg Oral Daily   baclofen  10 mg Oral QHS   Chlorhexidine Gluconate Cloth  6 each Topical Daily   droperidol  1.25 mg Intravenous Once   DULoxetine  60 mg Oral QHS   enoxaparin (LOVENOX) injection  40 mg Subcutaneous Q24H   feeding supplement  237 mL Oral BID BM   levothyroxine  25 mcg Oral Q0600   metoCLOPramide  5 mg Oral BID   metoprolol tartrate  25 mg Oral BID   polyethylene glycol  17 g Oral Daily   sodium chloride flush  10-40 mL Intracatheter Q12H   venlafaxine  75 mg Oral Daily   Continuous Infusions:  sodium chloride     niCARDipine Stopped (02/11/21 0404)   promethazine (PHENERGAN) injection (IM or IVPB) Stopped (02/10/21 0922)     LOS: 2 days    Time spent: 35 minutes    Braiden Presutti Darleen Crocker, DO [facility]  If 7PM-7AM, please contact night-coverage [email or web address] 02/12/2021, 10:26 AM

## 2021-02-12 NOTE — Progress Notes (Signed)
   Progress Note  Patient Name: [name] Date of Encounter: 02/12/2021  Primary Cardiologist: Dorris Carnes, MD  Chart reviewed remotely including recent hospital course and cardiology consultation by Dr. Harl Bowie.  Patient seen for elevated high-sensitivity troponin I levels in the setting of hypertensive crisis, no acute ST segment changes.  Peak high-sensitivity troponin I 252 with flat pattern not suggestive of ACS.  Chest CTA negative for aortic dissection and echocardiogram shows vigorous LVEF at 65 to 70% with severe LVH and no focal wall motion abnormalities.  Blood pressure control much better at this point and patient is now on Norvasc and Lopressor.  No further inpatient cardiac work-up is anticipated.  At discharge would have her scheduled to follow-up in the [facility] office with either Dr. Harrington Challenger or APP.  Would be reasonable to consider a follow-up Lexiscan Myoview for ischemic surveillance as an outpatient which we can schedule at office visit.  We will sign off.  Signed, Rozann Lesches, MD  02/12/2021, 5:24 PM

## 2021-02-13 DIAGNOSIS — I169 Hypertensive crisis, unspecified: Secondary | ICD-10-CM | POA: Diagnosis not present

## 2021-02-13 LAB — MAGNESIUM: Magnesium: 2.2 mg/dL (ref 1.7–2.4)

## 2021-02-13 LAB — BASIC METABOLIC PANEL
Anion gap: 2 — ABNORMAL LOW (ref 5–15)
BUN: 23 mg/dL — ABNORMAL HIGH (ref 6–20)
CO2: 25 mmol/L (ref 22–32)
Calcium: 7.5 mg/dL — ABNORMAL LOW (ref 8.9–10.3)
Chloride: 112 mmol/L — ABNORMAL HIGH (ref 98–111)
Creatinine, Ser: 1.34 mg/dL — ABNORMAL HIGH (ref 0.44–1.00)
GFR, Estimated: 46 mL/min — ABNORMAL LOW (ref >60)
Glucose, Bld: 82 mg/dL (ref 70–99)
Potassium: 3.9 mmol/L (ref 3.5–5.1)
Sodium: 139 mmol/L (ref 135–145)

## 2021-02-13 LAB — CBC
HCT: 32.9 % — ABNORMAL LOW (ref 36.0–46.0)
Hemoglobin: 10.5 g/dL — ABNORMAL LOW (ref 12.0–15.0)
MCH: 30.2 pg (ref 26.0–34.0)
MCHC: 31.9 g/dL (ref 30.0–36.0)
MCV: 94.5 fL (ref 80.0–100.0)
Platelets: 207 10*3/uL (ref 150–400)
RBC: 3.48 MIL/uL — ABNORMAL LOW (ref 3.87–5.11)
RDW: 13.5 % (ref 11.5–15.5)
WBC: 10.2 10*3/uL (ref 4.0–10.5)
nRBC: 0 % (ref 0.0–0.2)

## 2021-02-13 LAB — GLUCOSE, CAPILLARY: Glucose-Capillary: 88 mg/dL (ref 70–99)

## 2021-02-13 MED ORDER — ENSURE ENLIVE PO LIQD: 237.0000 mL | Freq: Two times a day (BID) | ORAL | 12 refills | Status: DC

## 2021-02-13 MED ORDER — AMLODIPINE BESYLATE 5 MG PO TABS: 5.0000 mg | ORAL_TABLET | Freq: Every day | ORAL | 3 refills | Status: DC

## 2021-02-13 MED ORDER — METOCLOPRAMIDE HCL 5 MG PO TABS: 5.0000 mg | ORAL_TABLET | Freq: Three times a day (TID) | ORAL | 2 refills | Status: DC

## 2021-02-13 MED ORDER — METOPROLOL TARTRATE 25 MG PO TABS: 25.0000 mg | ORAL_TABLET | Freq: Two times a day (BID) | ORAL | 2 refills | Status: DC

## 2021-02-13 NOTE — Discharge Summary (Signed)
Physician Discharge Summary  [name] [id number] DOB: 04/07/1961 DOA: 02/09/2021  PCP: No primary care provider on file.  Admit date: 02/09/2021  Discharge date: 02/13/2021  Admitted From:Home  Disposition:  Home  Recommendations for Outpatient Follow-up:  Follow up with PCP in 1-2 weeks and repeat BMP in 1 week Follow-up with cardiology outpatient for referral for outpatient stress testing Continued on metoprolol 25 mg twice daily Continue amlodipine 5 mg daily for better blood pressure control Continue now on Reglan 5 mg 3 times daily instead of twice daily for better control of gastroparesis and nausea symptoms Continue other home medications as prior  Home Health: None  Equipment/Devices: None  Discharge Condition:Stable  CODE STATUS: Full  Diet recommendation: Heart Healthy  Brief/Interim Summary:  [name] is a 60 y.o. female with medical history significant for hypertension, hypothyroidism, GERD, anxiety, depression, chronic pain syndrome and tobacco abuse who presents to the emergency department due to onset of headache which was bilateral across temples and neck, this was associated with nausea and vomiting, headache worsens with lights on.  She has not been able to take her home medications since onset of symptoms due to vomiting, she states that she vomited blood pressure medication that she took yesterday and that she has not been able to tolerate any oral intake due to vomiting.   She was admitted for symptomatic hypertensive crisis which has resolved and Cardene drip had been weaned off.  She was noted to have some intractable nausea and vomiting that was initially thought to be related to migraine headache versus gastroenteritis, but she may have been having a flareup of her gastroparesis the entire time.  She underwent CT angiogram studies to rule out dissection as well as 2D echocardiogram evaluation due to concern for ACS, but both have been ruled  out.  Her blood pressures are now stable with oral medications as prescribed above and her Reglan frequency has been increased.  Her AKI is improving as well and will need close follow-up in the outpatient setting.  No other acute events noted throughout the course of this admission and she is stable for discharge.  Discharge Diagnoses:  Active Problems:   Nausea with vomiting   Leukocytosis   Thrombocytosis   Hypertensive urgency   Chronic pain   Hypothyroidism   Migraine headache   Dehydration   Hypokalemia   Hypoalbuminemia due to protein-calorie malnutrition (HCC)   Elevated alkaline phosphatase in newborn   Tobacco abuse   Hypertensive crisis  Principal discharge diagnosis: Symptomatic hypertensive crisis along with intractable nausea and vomiting in the setting of gastroparesis.  Mild associated AKI as well as troponin elevation.  Discharge Instructions  Discharge Instructions     Ambulatory referral to Cardiology   Complete by: As directed    Diet - low sodium heart healthy   Complete by: As directed    Increase activity slowly   Complete by: As directed       Allergies as of 02/13/2021       Reactions   Codeine Hives, Other (See Comments)   Hallucinations   Latex Hives, Itching   Lyrica [pregabalin] Hives   Nsaids    Other Hives, Itching   Oral vitamins        Medication List     STOP taking these medications    Amitiza 24 MCG capsule Generic drug: lubiprostone   furosemide 20 MG tablet Commonly known as: Lasix   nystatin 100000 UNIT/ML suspension Commonly known as: MYCOSTATIN  spironolactone 50 MG tablet Commonly known as: ALDACTONE       TAKE these medications    ALPRAZolam 1 MG tablet Commonly known as: XANAX Take 1 mg by mouth 3 (three) times daily as needed for anxiety.   amLODipine 5 MG tablet Commonly known as: NORVASC Take 1 tablet (5 mg total) by mouth daily.   baclofen 10 MG tablet Commonly known as: LIORESAL Take 10 mg  by mouth at bedtime. May take up to 3 times daily for Muscle spasms   DULoxetine 60 MG capsule Commonly known as: CYMBALTA Take 60 mg by mouth at bedtime.   DULoxetine 30 MG capsule Commonly known as: CYMBALTA Take 30 mg by mouth daily.   estradiol 1 MG tablet Commonly known as: ESTRACE Take 1 mg by mouth daily.   feeding supplement Liqd Take 237 mLs by mouth 2 (two) times daily between meals.   levothyroxine 25 MCG tablet Commonly known as: SYNTHROID Take 25 mcg by mouth daily.   metoCLOPramide 5 MG tablet Commonly known as: REGLAN Take 1 tablet (5 mg total) by mouth 3 (three) times daily before meals. What changed: when to take this   metoprolol tartrate 25 MG tablet Commonly known as: LOPRESSOR Take 1 tablet (25 mg total) by mouth 2 (two) times daily. What changed: See the new instructions.   omeprazole 20 MG capsule Commonly known as: PRILOSEC Take 20 mg by mouth at bedtime.   oxycodone 30 MG immediate release tablet Commonly known as: ROXICODONE Take 30 mg by mouth 4 (four) times daily as needed. What changed: Another medication with the same name was removed. Continue taking this medication, and follow the directions you see here.   promethazine 25 MG tablet Commonly known as: PHENERGAN Take 25 mg by mouth every 6 (six) hours as needed.   risperiDONE 1 MG tablet Commonly known as: RISPERDAL Take 1 mg by mouth 2 (two) times daily.   venlafaxine 75 MG tablet Commonly known as: EFFEXOR Take 75 mg by mouth daily.   zolpidem 10 MG tablet Commonly known as: AMBIEN Take 10 mg by mouth at bedtime.        Follow-up Information     pcp. Schedule an appointment as soon as possible for a visit in 1 week(s).                 Allergies  Allergen Reactions   Codeine Hives and Other (See Comments)    Hallucinations   Latex Hives and Itching   Lyrica [Pregabalin] Hives   Nsaids    Other Hives and Itching    Oral vitamins     Consultations: Cardiology   Procedures/Studies: CT Head Wo Contrast  Result Date: 02/09/2021 CLINICAL DATA:  Headache. EXAM: CT HEAD WITHOUT CONTRAST TECHNIQUE: Contiguous axial images were obtained from the base of the skull through the vertex without intravenous contrast. COMPARISON:  None. FINDINGS: Brain: No evidence of acute infarction, hemorrhage, hydrocephalus, extra-axial collection or mass lesion/mass effect. Vascular: No hyperdense vessel or unexpected calcification. Skull: Normal. Negative for fracture or focal lesion. Sinuses/Orbits: No acute finding. Other: None. IMPRESSION: No acute intracranial abnormality seen. Electronically Signed   By: Marijo Conception M.D.   On: 02/09/2021 19:10   DG CHEST PORT 1 VIEW  Result Date: 02/10/2021 CLINICAL DATA:  Chest pain. EXAM: PORTABLE CHEST 1 VIEW COMPARISON:  February 24, 2015. FINDINGS: The heart size and mediastinal contours are within normal limits. Both lungs are clear. The visualized skeletal structures are unremarkable. IMPRESSION: No  active disease. Electronically Signed   By: Marijo Conception M.D.   On: 02/10/2021 08:50   ECHOCARDIOGRAM COMPLETE  Result Date: 02/10/2021    ECHOCARDIOGRAM REPORT   Patient Name:   Virgia Land Date of Exam: 02/10/2021 Medical Rec #:  [id number]         Height:       66.0 in Accession #:    [id number]        Weight:       195.0 lb Date of Birth:  1960-08-10         BSA:          1.978 m Patient Age:    10 years          BP:           148/78 mmHg Patient Gender: F                 HR:           122 bpm. Exam Location:  [facility] Procedure: 2D Echo, Cardiac Doppler and Color Doppler Indications:    Elevated Troponin  History:        Patient has prior history of Echocardiogram examinations, most                 recent 05/05/2017. Arrythmias:Tachycardia; Risk                 Factors:Hypertension and Current Smoker.  Sonographer:    Wenda Low Referring Phys: [id number] [place]  1. Left  ventricular ejection fraction, by estimation, is 65 to 70%. The left ventricle has normal function. The left ventricle has no regional wall motion abnormalities. There is severe left ventricular hypertrophy. Left ventricular diastolic parameters  are indeterminate.  2. Right ventricular systolic function is normal. The right ventricular size is normal. Tricuspid regurgitation signal is inadequate for assessing PA pressure.  3. The pericardial effusion is circumferential.  4. The mitral valve is normal in structure. No evidence of mitral valve regurgitation. No evidence of mitral stenosis.  5. The aortic valve is tricuspid. Aortic valve regurgitation is not visualized. No aortic stenosis is present.  6. IVC is small, suggesting low RA pressure and hypovolemia. FINDINGS  Left Ventricle: Left ventricular ejection fraction, by estimation, is 65 to 70%. The left ventricle has normal function. The left ventricle has no regional wall motion abnormalities. The left ventricular internal cavity size was normal in size. There is  severe left ventricular hypertrophy. Left ventricular diastolic parameters are indeterminate. Right Ventricle: The right ventricular size is normal. No increase in right ventricular wall thickness. Right ventricular systolic function is normal. Tricuspid regurgitation signal is inadequate for assessing PA pressure. Left Atrium: Left atrial size was normal in size. Right Atrium: Right atrial size was normal in size. Pericardium: Trivial pericardial effusion is present. The pericardial effusion is circumferential. Mitral Valve: The mitral valve is normal in structure. No evidence of mitral valve regurgitation. No evidence of mitral valve stenosis. MV peak gradient, 6.4 mmHg. The mean mitral valve gradient is 3.0 mmHg. Tricuspid Valve: The tricuspid valve is normal in structure. Tricuspid valve regurgitation is not demonstrated. No evidence of tricuspid stenosis. Aortic Valve: The aortic valve is  tricuspid. Aortic valve regurgitation is not visualized. No aortic stenosis is present. Aortic valve mean gradient measures 6.0 mmHg. Aortic valve peak gradient measures 10.5 mmHg. Aortic valve area, by VTI measures 2.38  cm. Pulmonic Valve: The pulmonic valve was not well visualized. Pulmonic  valve regurgitation is not visualized. No evidence of pulmonic stenosis. Aorta: The aortic root is normal in size and structure. Venous: IVC is small, suggesting low RA pressure and hypovolemia. IAS/Shunts: No atrial level shunt detected by color flow Doppler.  LEFT VENTRICLE PLAX 2D LVIDd:         2.93 cm  Diastology LVIDs:         1.95 cm  LV e' medial:    12.40 cm/s LV PW:         1.62 cm  LV E/e' medial:  8.4 LV IVS:        1.60 cm  LV e' lateral:   14.70 cm/s LVOT diam:     1.90 cm  LV E/e' lateral: 7.1 LV SV:         69 LV SV Index:   35 LVOT Area:     2.84 cm  RIGHT VENTRICLE RV Basal diam:  2.82 cm RV Mid diam:    2.37 cm RV S prime:     24.40 cm/s TAPSE (M-mode): 2.1 cm LEFT ATRIUM             Index       RIGHT ATRIUM           Index LA diam:        3.90 cm 1.97 cm/m  RA Area:     11.80 cm LA Vol (A2C):   44.4 ml 22.44 ml/m RA Volume:   21.10 ml  10.67 ml/m LA Vol (A4C):   32.6 ml 16.48 ml/m LA Biplane Vol: 37.8 ml 19.11 ml/m  AORTIC VALVE AV Area (Vmax):    2.66 cm AV Area (Vmean):   2.48 cm AV Area (VTI):     2.38 cm AV Vmax:           162.00 cm/s AV Vmean:          110.000 cm/s AV VTI:            0.290 m AV Peak Grad:      10.5 mmHg AV Mean Grad:      6.0 mmHg LVOT Vmax:         152.00 cm/s LVOT Vmean:        96.200 cm/s LVOT VTI:          0.243 m LVOT/AV VTI ratio: 0.84  AORTA Ao Root diam: 3.30 cm MITRAL VALVE MV Area (PHT): 6.22 cm     SHUNTS MV Area VTI:   3.42 cm     Systemic VTI:  0.24 m MV Peak grad:  6.4 mmHg     Systemic Diam: 1.90 cm MV Mean grad:  3.0 mmHg MV Vmax:       1.26 m/s MV Vmean:      75.1 cm/s MV Decel Time: 122 msec MV E velocity: 104.00 cm/s Carlyle Dolly MD Electronically  signed by Carlyle Dolly MD Signature Date/Time: 02/10/2021/10:48:49 AM    Final    CT Angio Chest/Abd/Pel for Dissection W and/or W/WO  Result Date: 02/10/2021 CLINICAL DATA:  Chest pain since Wednesday. EXAM: CT ANGIOGRAPHY CHEST, ABDOMEN AND PELVIS TECHNIQUE: Non-contrast CT of the chest was initially obtained. Multidetector CT imaging through the chest, abdomen and pelvis was performed using the standard protocol during bolus administration of intravenous contrast. Multiplanar reconstructed images and MIPs were obtained and reviewed to evaluate the vascular anatomy. CONTRAST:  129m OMNIPAQUE IOHEXOL 350 MG/ML SOLN COMPARISON:  Multiple prior CT scans. FINDINGS: CTA CHEST FINDINGS Cardiovascular: The heart is within  normal limits in size. No pericardial effusion. The aorta is normal in caliber. No dissection. No atherosclerotic calcifications. The branch vessels are patent. The pulmonary arteries are grossly normal. Mediastinum/Nodes: No mediastinal or hilar mass or lymphadenopathy. The esophagus is grossly normal. Lungs/Pleura: No acute pulmonary findings. No pulmonary lesions are identified. Very small left pleural effusion with minimal overlying atelectasis. Musculoskeletal: No significant bony findings. Review of the MIP images confirms the above findings. CTA ABDOMEN AND PELVIS FINDINGS VASCULAR Aorta: Normal caliber. No dissection. Distal atherosclerotic calcifications. Celiac: Patent SMA: Patent Renals: Patent IMA: Patent Inflow: Atherosclerotic calcifications but no significant stenosis. Veins: Unremarkable. Review of the MIP images confirms the above findings. NON-VASCULAR Hepatobiliary: Stable advanced cirrhotic changes involving the liver. No arterial phase enhancing lesions are identified to suggest hepatoma or dysplastic nodules. Portal venous hypertension noted with portal venous collaterals. The gallbladder is grossly normal. No common bile duct dilatation. Pancreas: No mass, inflammation or  ductal dilatation. Spleen: Normal size. No focal lesions. Adrenals/Urinary Tract: Adrenal glands and kidneys are grossly normal. No renal lesions or hydronephrosis. The bladder is unremarkable. Stomach/Bowel: Diffuse gastric wall thickening. Similar findings involving the duodenum. Similar appearance on a prior CT scan from 2018 may be due to ascites and low albumin. Gastroduodenitis is a possibility but unlikely. There is also mild colonic wall thickening. No colonic mass or obstruction. Lymphatic: No abdominal or pelvic lymphadenopathy. Reproductive: Surgically absent. Other: Small volume abdominal ascites and diffuse mesenteric edema. Small to moderate amount of free pelvic fluid. Musculoskeletal: No significant bony findings. Review of the MIP images confirms the above findings. IMPRESSION: 1. Normal thoracic and abdominal aorta. No aneurysm or dissection. 2. Stable advanced cirrhotic changes involving the liver with portal venous hypertension, portal venous collaterals and small volume abdominal ascites. No worrisome hepatic lesions. 3. Diffuse gastric, duodenal and colonic wall thickening likely due to ascites and low albumin related to the patient's cirrhosis. Aortic Atherosclerosis (ICD10-I70.0). Electronically Signed   By: Marijo Sanes M.D.   On: 02/10/2021 12:24   [place] EKG SITE RITE  Result Date: 02/10/2021 If Site Rite image not attached, placement could not be confirmed due to current cardiac rhythm.    Discharge Exam: Vitals:   02/13/21 0400 02/13/21 0700  BP: (!) 141/54   Pulse: 68   Resp: 19   Temp: 97.7 F (36.5 C) 97.6 F (36.4 C)  SpO2: 97%    Vitals:   02/12/21 2300 02/13/21 0000 02/13/21 0400 02/13/21 0700  BP: (!) 112/58 128/71 (!) 141/54   Pulse: 67 (!) 132 68   Resp: (!) '8 20 19   '$ Temp:  (!) 97.5 F (36.4 C) 97.7 F (36.5 C) 97.6 F (36.4 C)  TempSrc:   Oral Oral  SpO2: 92% (!) 83% 97%   Weight:      Height:        General: Pt is alert, awake, not in acute  distress Cardiovascular: RRR, S1/S2 +, no rubs, no gallops Respiratory: CTA bilaterally, no wheezing, no rhonchi Abdominal: Soft, NT, ND, bowel sounds + Extremities: no edema, no cyanosis    The results of significant diagnostics from this hospitalization (including imaging, microbiology, ancillary and laboratory) are listed below for reference.     Microbiology: Recent Results (from the past 240 hour(s))  Resp Panel by RT-PCR (Flu A&B, Covid) Nasopharyngeal Swab     Status: None   Collection Time: 02/09/21 10:11 PM   Specimen: Nasopharyngeal Swab; Nasopharyngeal(NP) swabs in vial transport medium  Result Value Ref Range Status  SARS Coronavirus 2 by RT PCR NEGATIVE NEGATIVE Final    Comment: (NOTE) SARS-CoV-2 target nucleic acids are NOT DETECTED.  The SARS-CoV-2 RNA is generally detectable in upper respiratory specimens during the acute phase of infection. The lowest concentration of SARS-CoV-2 viral copies this assay can detect is 138 copies/mL. A negative result does not preclude SARS-Cov-2 infection and should not be used as the sole basis for treatment or other patient management decisions. A negative result may occur with  improper specimen collection/handling, submission of specimen other than nasopharyngeal swab, presence of viral mutation(s) within the areas targeted by this assay, and inadequate number of viral copies(<138 copies/mL). A negative result must be combined with clinical observations, patient history, and epidemiological information. The expected result is Negative.  Fact Sheet for Patients:  EntrepreneurPulse.com.au  Fact Sheet for Healthcare Providers:  IncredibleEmployment.be  This test is no t yet approved or cleared by the [place] FDA and  has been authorized for detection and/or diagnosis of SARS-CoV-2 by FDA under an Emergency Use Authorization (EUA). This EUA will remain  in effect (meaning this test  can be used) for the duration of the COVID-19 declaration under Section 564(b)(1) of the Act, 21 U.S.C.section 360bbb-3(b)(1), unless the authorization is terminated  or revoked sooner.       Influenza A by PCR NEGATIVE NEGATIVE Final   Influenza B by PCR NEGATIVE NEGATIVE Final    Comment: (NOTE) The Xpert Xpress SARS-CoV-2/FLU/RSV plus assay is intended as an aid in the diagnosis of influenza from Nasopharyngeal swab specimens and should not be used as a sole basis for treatment. Nasal washings and aspirates are unacceptable for Xpert Xpress SARS-CoV-2/FLU/RSV testing.  Fact Sheet for Patients: EntrepreneurPulse.com.au  Fact Sheet for Healthcare Providers: IncredibleEmployment.be  This test is not yet approved or cleared by the [place] FDA and has been authorized for detection and/or diagnosis of SARS-CoV-2 by FDA under an Emergency Use Authorization (EUA). This EUA will remain in effect (meaning this test can be used) for the duration of the COVID-19 declaration under Section 564(b)(1) of the Act, 21 U.S.C. section 360bbb-3(b)(1), unless the authorization is terminated or revoked.  Performed at [facility], [address], [place], [place] [address]   MRSA Next Gen by PCR, Nasal     Status: None   Collection Time: 02/10/21  2:00 PM   Specimen: Nasal Mucosa; Nasal Swab  Result Value Ref Range Status   MRSA by PCR Next Gen NOT DETECTED NOT DETECTED Final    Comment: (NOTE) The GeneXpert MRSA Assay (FDA approved for NASAL specimens only), is one component of a comprehensive MRSA colonization surveillance program. It is not intended to diagnose MRSA infection nor to guide or monitor treatment for MRSA infections. Test performance is not FDA approved in patients less than 52 years old. Performed at [facility], [address], [place], [place] [address]      Labs: BNP (last 3 results) No results for input(s): BNP in the last 8760  hours. Basic Metabolic Panel: Recent Labs  Lab 02/09/21 1726 02/10/21 0654 02/11/21 0306 02/12/21 0407 02/13/21 0434  NA 139 140 135 133* 139  K 3.3* 4.0 3.4* 3.9 3.9  CL 109 110 107 106 112*  CO2 22 19* 21* 22 25  GLUCOSE 137* 131* 128* 108* 82  BUN 21* 29* 40* 35* 23*  CREATININE 1.09* 1.25* 1.60* 1.55* 1.34*  CALCIUM 8.6* 8.4* 7.4* 7.3* 7.5*  MG  --  1.7 1.6* 2.3 2.2  PHOS  --  2.9  --   --   --    Liver Function Tests: Recent Labs  Lab 02/09/21 1726 02/10/21 0654 02/11/21 0306  AST 33 43* 41  ALT '17 20 24  '$ ALKPHOS 284* 287* 209*  BILITOT 1.1 1.1 0.8  PROT 6.2* 6.7 4.8*  ALBUMIN 2.6* 2.8* 2.1*   Recent Labs  Lab 02/09/21 1726  LIPASE 28   No results for input(s): AMMONIA in the last 168 hours. CBC: Recent Labs  Lab 02/09/21 1726 02/10/21 0654 02/11/21 0306 02/13/21 0434  WBC 21.6* 26.3* 24.5* 10.2  NEUTROABS 14.0*  --   --   --   HGB 15.4* 16.4* 11.9* 10.5*  HCT 45.0 47.8* 36.5 32.9*  MCV 89.6 89.7 94.3 94.5  PLT 426* 480* 336 207   Cardiac Enzymes: No results for input(s): CKTOTAL, CKMB, CKMBINDEX, TROPONINI in the last 168 hours. BNP: Invalid input(s): POCBNP CBG: Recent Labs  Lab 02/13/21 0735  GLUCAP 88   D-Dimer No results for input(s): DDIMER in the last 72 hours. Hgb A1c No results for input(s): HGBA1C in the last 72 hours. Lipid Profile No results for input(s): CHOL, HDL, LDLCALC, TRIG, CHOLHDL, LDLDIRECT in the last 72 hours. Thyroid function studies No results for input(s): TSH, T4TOTAL, T3FREE, THYROIDAB in the last 72 hours.  Invalid input(s): FREET3 Anemia work up No results for input(s): VITAMINB12, FOLATE, FERRITIN, TIBC, IRON, RETICCTPCT in the last 72 hours. Urinalysis    Component Value Date/Time   COLORURINE AMBER (A) 12/17/2016 2131   APPEARANCEUR HAZY (A) 12/17/2016 2131   LABSPEC 1.030 12/17/2016 2131   PHURINE 6.0 12/17/2016 2131   GLUCOSEU NEGATIVE 12/17/2016 2131   HGBUR NEGATIVE 12/17/2016 2131    BILIRUBINUR SMALL (A) 12/17/2016 2131   KETONESUR NEGATIVE 12/17/2016 2131   PROTEINUR >=300 (A) 12/17/2016 2131   UROBILINOGEN 0.2 04/04/2009 2232   NITRITE NEGATIVE 12/17/2016 2131   LEUKOCYTESUR NEGATIVE 12/17/2016 2131   Sepsis Labs Invalid input(s): PROCALCITONIN,  WBC,  LACTICIDVEN Microbiology Recent Results (from the past 240 hour(s))  Resp Panel by RT-PCR (Flu A&B, Covid) Nasopharyngeal Swab     Status: None   Collection Time: 02/09/21 10:11 PM   Specimen: Nasopharyngeal Swab; Nasopharyngeal(NP) swabs in vial transport medium  Result Value Ref Range Status   SARS Coronavirus 2 by RT PCR NEGATIVE NEGATIVE Final    Comment: (NOTE) SARS-CoV-2 target nucleic acids are NOT DETECTED.  The SARS-CoV-2 RNA is generally detectable in upper respiratory specimens during the acute phase of infection. The lowest concentration of SARS-CoV-2 viral copies this assay can detect is 138 copies/mL. A negative result does not preclude SARS-Cov-2 infection and should not be used as the sole basis for treatment or other patient management decisions. A negative result may occur with  improper specimen collection/handling, submission of specimen other than nasopharyngeal swab, presence of viral mutation(s) within the areas targeted by this assay, and inadequate number of viral copies(<138 copies/mL). A negative result must be combined with clinical observations, patient history, and epidemiological information. The expected result is Negative.  Fact Sheet for Patients:  EntrepreneurPulse.com.au  Fact Sheet for Healthcare Providers:  IncredibleEmployment.be  This test is no t yet approved or cleared by the [place] FDA and  has been authorized for detection and/or diagnosis of SARS-CoV-2 by FDA under an Emergency Use Authorization (EUA). This EUA will remain  in effect (meaning this test can be used) for the duration of the COVID-19 declaration under  Section 564(b)(1) of the Act, 21 U.S.C.section 360bbb-3(b)(1), unless the  authorization is terminated  or revoked sooner.       Influenza A by PCR NEGATIVE NEGATIVE Final   Influenza B by PCR NEGATIVE NEGATIVE Final    Comment: (NOTE) The Xpert Xpress SARS-CoV-2/FLU/RSV plus assay is intended as an aid in the diagnosis of influenza from Nasopharyngeal swab specimens and should not be used as a sole basis for treatment. Nasal washings and aspirates are unacceptable for Xpert Xpress SARS-CoV-2/FLU/RSV testing.  Fact Sheet for Patients: EntrepreneurPulse.com.au  Fact Sheet for Healthcare Providers: IncredibleEmployment.be  This test is not yet approved or cleared by the [place] FDA and has been authorized for detection and/or diagnosis of SARS-CoV-2 by FDA under an Emergency Use Authorization (EUA). This EUA will remain in effect (meaning this test can be used) for the duration of the COVID-19 declaration under Section 564(b)(1) of the Act, 21 U.S.C. section 360bbb-3(b)(1), unless the authorization is terminated or revoked.  Performed at [facility], [address] [place], [place] [id number]   MRSA Next Gen by PCR, Nasal     Status: None   Collection Time: 02/10/21  2:00 PM   Specimen: Nasal Mucosa; Nasal Swab  Result Value Ref Range Status   MRSA by PCR Next Gen NOT DETECTED NOT DETECTED Final    Comment: (NOTE) The GeneXpert MRSA Assay (FDA approved for NASAL specimens only), is one component of a comprehensive MRSA colonization surveillance program. It is not intended to diagnose MRSA infection nor to guide or monitor treatment for MRSA infections. Test performance is not FDA approved in patients less than 38 years old. Performed at [facility], [address] [place], [place] [address]      Time coordinating discharge: 35 minutes  SIGNED:   Rodena Goldmann, DO [facility] 02/13/2021, 9:45 AM  If 7PM-7AM, please  contact night-coverage [email or web address]

## 2021-02-13 NOTE — Progress Notes (Signed)
Patient being discharged to home. IV access removed and paperwork given. Patient dressed herself and will be taken down by wheel chair.

## 2021-02-14 ENCOUNTER — Encounter: Payer: Self-pay | Admitting: Internal Medicine

## 2021-02-14 ENCOUNTER — Ambulatory Visit: Payer: Medicare HMO | Admitting: Internal Medicine

## 2021-02-20 DIAGNOSIS — E6609 Other obesity due to excess calories: Secondary | ICD-10-CM | POA: Diagnosis not present

## 2021-02-20 DIAGNOSIS — Z6834 Body mass index (BMI) 34.0-34.9, adult: Secondary | ICD-10-CM | POA: Diagnosis not present

## 2021-02-20 DIAGNOSIS — D649 Anemia, unspecified: Secondary | ICD-10-CM | POA: Diagnosis not present

## 2021-02-20 DIAGNOSIS — R110 Nausea: Secondary | ICD-10-CM | POA: Diagnosis not present

## 2021-02-24 ENCOUNTER — Telehealth: Payer: Self-pay | Admitting: Internal Medicine

## 2021-02-24 NOTE — Telephone Encounter (Signed)
Noted  

## 2021-02-24 NOTE — Telephone Encounter (Signed)
Please call patient, she said her hemaglobin is low and she was in the hospital and missed her last appointment

## 2021-02-24 NOTE — Telephone Encounter (Signed)
Pt is needing to reschedule her appointment that she missed due to her being in the hospital. Thanks.

## 2021-02-27 DIAGNOSIS — Z6834 Body mass index (BMI) 34.0-34.9, adult: Secondary | ICD-10-CM | POA: Diagnosis not present

## 2021-02-27 DIAGNOSIS — D649 Anemia, unspecified: Secondary | ICD-10-CM | POA: Diagnosis not present

## 2021-02-27 DIAGNOSIS — R110 Nausea: Secondary | ICD-10-CM | POA: Diagnosis not present

## 2021-02-27 DIAGNOSIS — E6609 Other obesity due to excess calories: Secondary | ICD-10-CM | POA: Diagnosis not present

## 2021-03-03 ENCOUNTER — Ambulatory Visit: Payer: Medicare HMO | Admitting: Internal Medicine

## 2021-03-07 ENCOUNTER — Other Ambulatory Visit: Payer: Self-pay | Admitting: *Deleted

## 2021-03-07 ENCOUNTER — Encounter: Payer: Self-pay | Admitting: *Deleted

## 2021-03-07 ENCOUNTER — Ambulatory Visit: Payer: Medicare HMO | Admitting: Gastroenterology

## 2021-03-07 ENCOUNTER — Other Ambulatory Visit: Payer: Self-pay

## 2021-03-07 ENCOUNTER — Encounter: Payer: Self-pay | Admitting: Gastroenterology

## 2021-03-07 VITALS — BP 131/74 | HR 75 | Temp 97.5°F | Ht 66.0 in | Wt 200.0 lb

## 2021-03-07 DIAGNOSIS — K7460 Unspecified cirrhosis of liver: Secondary | ICD-10-CM

## 2021-03-07 DIAGNOSIS — K7290 Hepatic failure, unspecified without coma: Secondary | ICD-10-CM

## 2021-03-07 DIAGNOSIS — R079 Chest pain, unspecified: Secondary | ICD-10-CM | POA: Diagnosis not present

## 2021-03-07 DIAGNOSIS — Z800 Family history of malignant neoplasm of digestive organs: Secondary | ICD-10-CM

## 2021-03-07 NOTE — Progress Notes (Signed)
Referring Provider: No ref. provider found Primary Care Physician:  System, Provider Not In Primary GI: Dr. Gala Romney  Chief Complaint  Patient presents with   abdominal distention    Swelling in feet and abdomen. States labs done yesterday showed anemia and WBC was low    HPI:   [name] is a 60 y.o. female presenting today with a history of cirrhosis of unclear etiology. History of mildly elevated ASMA  in the past but normal in 2020. Numerous other serologies negative. Immune to Hep B. Ceruloplasmin normal. Alpha-1 antitrypsin negative. ANA negative. Hep B surface antigen negative. Hep C antibody negative. AMA negative. . Chronically elevated alk phos. History of fatty liver. Last para in 2018. 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  No H. pylori.  Rectal and colonic polyps removed (hyperplastic) colonic diverticulosis.  Overdue for surveillance colonoscopy.    Last para in 2018. Labs in Sept 2022 while inpatient: chronically elevated alk phos but improved. Now 209. Transaminases normal. CBC with chronic normocytic anemia.   Feels pressure in abdomen like it will explode. States she felt like this before and had a paracentesis. Symptoms for several months. Legs and feet are swelling intermittently. No added salt.   Had labs and told iron was low per PCP. Has scant rectal bleeding at times. No melena. Alternating constipation and diarrhea in the past month.   Omeprazole once daily controls reflux "so/so". No NSAIDs. Takes Tylenol just as needed. Has intermittent nausea and vomiting. Has been on reglan for years. States chronic issues with stomach.   Recent imaging Sept 2022 via CTA on file. Cirhotic liver. Diffuse gastrit, duodenal, and colonic wall thickening likely due to ascites and low albumin.   Noting intermittent chest pain, even at rest.    Past  Medical History:  Diagnosis Date   Anxiety    Asthma    COPD (chronic obstructive pulmonary disease) (HCC)    Depression    Disc degeneration, lumbar    GERD (gastroesophageal reflux disease)    Hypertension    Leukocytosis    Lumbar disc disease    Shortness of breath    Thrombocytosis     Past Surgical History:  Procedure Laterality Date   ABDOMINAL HYSTERECTOMY     BIOPSY  03/25/2014   Procedure: GASTRIC BIOPSIES;  Surgeon: Daneil Dolin, MD;  Location: [facility];  Service: Endoscopy;;   BRAIN SURGERY     COLONOSCOPY  02/21/2007   HTD:SKAJ papilla and internal hemorrhoids, diminutive rectal polyp/Left-sided diverticula (sigmoid) pedunculated polyps mid descending   COLONOSCOPY  2008   Dr. Gala Romney: segmental biopsy negative for microscopic colitis. Three polyps, one tubular adenoma. Surveillance due 2013.    COLONOSCOPY WITH PROPOFOL N/A 03/25/2014   RMR: Rectal and colonic polyps removed as described above.  Colonic diverticulosis. CT abnormality likely artifactual in orgin     ESOPHAGOGASTRODUODENOSCOPY  08/08/2005   GOT:LXBWIO esophagogastroduodenoscopy.   ESOPHAGOGASTRODUODENOSCOPY  2011   Dr. Gala Romney: normal esophagus, small hiatal hernia, duodenal diverticulum, negative H.pylori and celiac   ESOPHAGOGASTRODUODENOSCOPY (EGD) WITH PROPOFOL N/A 03/25/2014   RMR: Probable occult cervical esophageal web- status post dilation as described above. Abnormal gastic mucosa of uncertain significance-status post gasrtric biopsy   MALONEY DILATION N/A 03/25/2014   Procedure: MALONEY ESOPHAGEAL DILATION #54 Fr;  Surgeon: Daneil Dolin, MD;  Location: [facility];  Service: Endoscopy;  Laterality: N/A;  POLYPECTOMY  03/25/2014   Procedure: SIGMOID AND RECTAL POLYPECTOMY;  Surgeon: Daneil Dolin, MD;  Location: [facility];  Service: Endoscopy;;   right knee surgery     rt breast biopsy     TOTAL ABDOMINAL HYSTERECTOMY W/ BILATERAL SALPINGOOPHORECTOMY     age 85    Current Outpatient Medications   Medication Sig Dispense Refill   ALPRAZolam (XANAX) 1 MG tablet Take 1 mg by mouth 3 (three) times daily as needed for anxiety.     amLODipine (NORVASC) 5 MG tablet Take 1 tablet (5 mg total) by mouth daily. 30 tablet 3   baclofen (LIORESAL) 10 MG tablet Take 10 mg by mouth at bedtime. May take up to 3 times daily for Muscle spasms     DULoxetine (CYMBALTA) 30 MG capsule Take 30 mg by mouth daily.     DULoxetine (CYMBALTA) 60 MG capsule Take 60 mg by mouth at bedtime.     estradiol (ESTRACE) 1 MG tablet Take 1 mg by mouth daily.     feeding supplement (ENSURE ENLIVE / ENSURE PLUS) LIQD Take 237 mLs by mouth 2 (two) times daily between meals. (Patient taking differently: Take 237 mLs by mouth 3 (three) times daily between meals.) 237 mL 12   furosemide (LASIX) 20 MG tablet Take 1 tablet (20 mg total) by mouth daily. Take with spironolactone 30 tablet 3   levothyroxine (SYNTHROID, LEVOTHROID) 25 MCG tablet Take 25 mcg by mouth daily.     metoCLOPramide (REGLAN) 5 MG tablet Take 1 tablet (5 mg total) by mouth 3 (three) times daily before meals. 90 tablet 2   metoprolol tartrate (LOPRESSOR) 25 MG tablet Take 1 tablet (25 mg total) by mouth 2 (two) times daily. 60 tablet 2   omeprazole (PRILOSEC) 20 MG capsule Take 20 mg by mouth at bedtime.      oxycodone (ROXICODONE) 30 MG immediate release tablet Take 30 mg by mouth 4 (four) times daily as needed.     promethazine (PHENERGAN) 25 MG tablet Take 25 mg by mouth every 6 (six) hours as needed.     risperiDONE (RISPERDAL) 1 MG tablet Take 1 mg by mouth 2 (two) times daily.     spironolactone (ALDACTONE) 50 MG tablet Take 1 tablet (50 mg total) by mouth daily. Take with lasix 30 tablet 3   venlafaxine (EFFEXOR) 75 MG tablet Take 75 mg by mouth daily.     zolpidem (AMBIEN) 10 MG tablet Take 10 mg by mouth at bedtime.       No current facility-administered medications for this visit.    Allergies as of 03/07/2021 - Review Complete 03/07/2021  Allergen  Reaction Noted   Codeine Hives and Other (See Comments) 03/03/2008   Latex Hives and Itching 02/23/2011   Lyrica [pregabalin] Hives 11/20/2016   Nsaids  11/20/2016   Other Hives and Itching 02/23/2013    Family History  Adopted: Yes  Problem Relation Age of Onset   Pancreatic cancer Mother    Hepatitis C Brother    Liver cancer Brother        suicide   Pancreatic cancer Maternal Aunt    Kidney cancer Maternal Uncle    Colon cancer Paternal Aunt    Kidney cancer Paternal Grandfather    Colon cancer Brother 74   Leukemia Sister        1/2 sister   Pancreatic cancer Paternal Grandmother    Pancreatic cancer Maternal Uncle     Social History   Socioeconomic History  Marital status: Single    Spouse name: Not on file   Number of children: Not on file   Years of education: Not on file   Highest education level: Not on file  Occupational History   Not on file  Tobacco Use   Smoking status: Every Day    Packs/day: 1.00    Years: 30.00    Pack years: 30.00    Types: Cigarettes   Smokeless tobacco: Never  Vaping Use   Vaping Use: Never used  Substance and Sexual Activity   Alcohol use: No    Comment: see HPI. Patient denies alcohol use at present   Drug use: No   Sexual activity: Not on file  Other Topics Concern   Not on file  Social History Narrative   Not on file   Social Determinants of Health   Financial Resource Strain: Not on file  Food Insecurity: Not on file  Transportation Needs: Not on file  Physical Activity: Not on file  Stress: Not on file  Social Connections: Not on file    Review of Systems: Gen: Denies fever, chills, anorexia. Denies fatigue, weakness, weight loss.  CV: see HPI Resp: Denies dyspnea at rest, cough, wheezing, coughing up blood, and pleurisy. GI: see HPI Derm: Denies rash, itching, dry skin Psych: Denies depression, anxiety, memory loss, confusion. No homicidal or suicidal ideation.  Heme: see HPI  Physical Exam: BP  131/74   Pulse 75   Temp (!) 97.5 F (36.4 C) (Temporal)   Ht _0  (1.676 m)   Wt 200 lb (90.7 kg)   BMI 32.28 kg/m  General:   Alert and oriented. No distress noted. Pleasant and cooperative.  Head:  Normocephalic and atraumatic. Eyes:  Conjuctiva clear without scleral icterus. Mouth: mask in place Abdomen:  +BS, distended but soft, no obvious tense ascites, TTP diffusely. No rebound or guarding. No HSM or masses noted. Msk:  Symmetrical without gross deformities. Normal posture. Extremities:  With 1-2 + edema to knee lower extremity Neurologic:  Alert and  oriented x4 Psych:  Alert and cooperative. Normal mood and affect.  ASSESSMENT: KAILIA STARRY is a 59 y.o. female presenting today  with a history of cirrhosis of unclear etiology; however, query due to fatty liver. Chronically elevated alk phos but negative AMA. Thorough serologies historically. She is now presenting with lower extremity edema and concerns for ascites, abdominal pain, N/V.   Ascites/anasarca: abdomen without obvious tense ascites. [place] para completed after visit without ascites present for para. Lower extremity edema noted. Will start on low dose diuretics (lasix 20 and aldactone 50 mg daily) with recheck of BMP in 1 week.  Cirrhosis: imaging on file (CTA from Sept 2022) with know cirrhosis. Needs EGD for variceal screening. Last in 2015 without varices. Will need to arrange this in near future; however, due to chest pain, will have her see cardiology first then arrange this.   History of colon polyps: colonoscopy to be planned after cardiology evaluation.   Chronic normocytic anemia: patient reports she was told iron was low by PCP. Obtaining outside labs. Appears stable at baseline.    Referral to cardiology for chest pain at rest. Will then pursue TCS/EGD in future.     PLAN:  [place] para: completed after office visit as noted above  Start Lasix 20 mg and aldactone 50 mg with repeat BMP in 1  week  Cardiology referral  Proceed with colonoscopy/EGD by Dr. Gala Romney in near future after cardiology evaluation. Will  pursue with Propofol. The risks, benefits, and alternatives have been discussed with the patient in detail. The patient states understanding and desires to proceed.  Obtain outside labs from PCP.    Annitta Needs, PhD, ANP-BC [facility]

## 2021-03-07 NOTE — Patient Instructions (Signed)
We are arranging a paracentesis if fluid is present.  Please have labs done today. We are double checking on Hepatitis C.  I am referring you to a cardiologist.  At some point, we need to do the upper endoscopy and colonoscopy, but I want you to see cardiology first.  I am requesting labs from your primary care as well!  Further recommendations to follow!  It was a pleasure to see you today. I want to create trusting relationships with patients to provide genuine, compassionate, and quality care. I value your feedback. If you receive a survey regarding your visit,  I greatly appreciate you taking time to fill this out.   Annitta Needs, PhD, ANP-BC [facility]

## 2021-03-08 LAB — COMPLETE METABOLIC PANEL WITH GFR
AG Ratio: 1.1 (calc) (ref 1.0–2.5)
ALT: 16 U/L (ref 6–29)
AST: 28 U/L (ref 10–35)
Albumin: 2.8 g/dL — ABNORMAL LOW (ref 3.6–5.1)
Alkaline phosphatase (APISO): 251 U/L — ABNORMAL HIGH (ref 37–153)
BUN: 10 mg/dL (ref 7–25)
CO2: 29 mmol/L (ref 20–32)
Calcium: 8.6 mg/dL (ref 8.6–10.4)
Chloride: 103 mmol/L (ref 98–110)
Creat: 0.82 mg/dL (ref 0.50–1.03)
Globulin: 2.5 g/dL (calc) (ref 1.9–3.7)
Glucose, Bld: 97 mg/dL (ref 65–99)
Potassium: 3.7 mmol/L (ref 3.5–5.3)
Sodium: 138 mmol/L (ref 135–146)
Total Bilirubin: 0.4 mg/dL (ref 0.2–1.2)
Total Protein: 5.3 g/dL — ABNORMAL LOW (ref 6.1–8.1)
eGFR: 82 mL/min/{1.73_m2} (ref >=60)

## 2021-03-08 LAB — PROTIME-INR
INR: 1
Prothrombin Time: 10.3 s (ref 9.0–11.5)

## 2021-03-08 LAB — HEPATITIS C ANTIBODY
Hepatitis C Ab: NONREACTIVE
SIGNAL TO CUT-OFF: 0.01 (ref <1.00)

## 2021-03-08 LAB — GAMMA GT: GGT: 208 U/L — ABNORMAL HIGH (ref 3–70)

## 2021-03-09 ENCOUNTER — Other Ambulatory Visit: Payer: Self-pay | Admitting: Gastroenterology

## 2021-03-09 ENCOUNTER — Ambulatory Visit (HOSPITAL_COMMUNITY)
Admission: RE | Admit: 2021-03-09 | Discharge: 2021-03-09 | Disposition: A | Discharge status: Home / Self Care | Payer: Medicare HMO | Source: Ambulatory Visit | Attending: Gastroenterology | Admitting: Gastroenterology

## 2021-03-09 ENCOUNTER — Other Ambulatory Visit: Payer: Self-pay

## 2021-03-09 ENCOUNTER — Encounter (HOSPITAL_COMMUNITY): Payer: Self-pay

## 2021-03-09 DIAGNOSIS — G894 Chronic pain syndrome: Secondary | ICD-10-CM | POA: Diagnosis not present

## 2021-03-09 DIAGNOSIS — Z6835 Body mass index (BMI) 35.0-35.9, adult: Secondary | ICD-10-CM | POA: Diagnosis not present

## 2021-03-09 DIAGNOSIS — R188 Other ascites: Secondary | ICD-10-CM | POA: Diagnosis not present

## 2021-03-09 DIAGNOSIS — K7460 Unspecified cirrhosis of liver: Secondary | ICD-10-CM | POA: Diagnosis not present

## 2021-03-09 DIAGNOSIS — E538 Deficiency of other specified B group vitamins: Secondary | ICD-10-CM | POA: Diagnosis not present

## 2021-03-09 DIAGNOSIS — K7290 Hepatic failure, unspecified without coma: Secondary | ICD-10-CM | POA: Insufficient documentation

## 2021-03-09 DIAGNOSIS — M5136 Other intervertebral disc degeneration, lumbar region: Secondary | ICD-10-CM | POA: Diagnosis not present

## 2021-03-09 NOTE — Progress Notes (Signed)
    Pt was scheduled for US guided Paracentesis today  Limited US Abd performed-- No free fluid noted NO paracentesis performed.  Pt DC to home

## 2021-03-14 DIAGNOSIS — Z1211 Encounter for screening for malignant neoplasm of colon: Secondary | ICD-10-CM | POA: Diagnosis not present

## 2021-03-15 MED ORDER — SPIRONOLACTONE 50 MG PO TABS: 50.0000 mg | ORAL_TABLET | Freq: Every day | ORAL | 3 refills | Status: DC

## 2021-03-15 MED ORDER — FUROSEMIDE 20 MG PO TABS: 20.0000 mg | ORAL_TABLET | Freq: Every day | ORAL | 3 refills | Status: DC

## 2021-03-16 ENCOUNTER — Other Ambulatory Visit: Payer: Self-pay

## 2021-03-16 DIAGNOSIS — R944 Abnormal results of kidney function studies: Secondary | ICD-10-CM

## 2021-03-16 DIAGNOSIS — K7290 Hepatic failure, unspecified without coma: Secondary | ICD-10-CM

## 2021-03-30 ENCOUNTER — Other Ambulatory Visit: Payer: Self-pay | Admitting: Physician Assistant

## 2021-03-30 DIAGNOSIS — Z139 Encounter for screening, unspecified: Secondary | ICD-10-CM

## 2021-04-10 DIAGNOSIS — E538 Deficiency of other specified B group vitamins: Secondary | ICD-10-CM | POA: Diagnosis not present

## 2021-04-10 DIAGNOSIS — G894 Chronic pain syndrome: Secondary | ICD-10-CM | POA: Diagnosis not present

## 2021-04-10 DIAGNOSIS — M5136 Other intervertebral disc degeneration, lumbar region: Secondary | ICD-10-CM | POA: Diagnosis not present

## 2021-05-09 DIAGNOSIS — G894 Chronic pain syndrome: Secondary | ICD-10-CM | POA: Diagnosis not present

## 2021-05-09 DIAGNOSIS — M5136 Other intervertebral disc degeneration, lumbar region: Secondary | ICD-10-CM | POA: Diagnosis not present

## 2021-05-09 DIAGNOSIS — I10 Essential (primary) hypertension: Secondary | ICD-10-CM | POA: Diagnosis not present

## 2021-05-09 DIAGNOSIS — E6609 Other obesity due to excess calories: Secondary | ICD-10-CM | POA: Diagnosis not present

## 2021-05-09 DIAGNOSIS — F419 Anxiety disorder, unspecified: Secondary | ICD-10-CM | POA: Diagnosis not present

## 2021-05-09 DIAGNOSIS — R69 Illness, unspecified: Secondary | ICD-10-CM | POA: Diagnosis not present

## 2021-05-09 DIAGNOSIS — F329 Major depressive disorder, single episode, unspecified: Secondary | ICD-10-CM | POA: Diagnosis not present

## 2021-05-09 DIAGNOSIS — Z6836 Body mass index (BMI) 36.0-36.9, adult: Secondary | ICD-10-CM | POA: Diagnosis not present

## 2021-05-09 DIAGNOSIS — E538 Deficiency of other specified B group vitamins: Secondary | ICD-10-CM | POA: Diagnosis not present

## 2021-05-09 DIAGNOSIS — F3181 Bipolar II disorder: Secondary | ICD-10-CM | POA: Diagnosis not present

## 2021-05-23 DIAGNOSIS — J22 Unspecified acute lower respiratory infection: Secondary | ICD-10-CM | POA: Diagnosis not present

## 2021-06-08 DIAGNOSIS — M5136 Other intervertebral disc degeneration, lumbar region: Secondary | ICD-10-CM | POA: Diagnosis not present

## 2021-06-08 DIAGNOSIS — E063 Autoimmune thyroiditis: Secondary | ICD-10-CM | POA: Diagnosis not present

## 2021-06-08 DIAGNOSIS — G894 Chronic pain syndrome: Secondary | ICD-10-CM | POA: Diagnosis not present

## 2021-06-08 DIAGNOSIS — Z6836 Body mass index (BMI) 36.0-36.9, adult: Secondary | ICD-10-CM | POA: Diagnosis not present

## 2021-06-08 DIAGNOSIS — E669 Obesity, unspecified: Secondary | ICD-10-CM | POA: Diagnosis not present

## 2021-06-08 DIAGNOSIS — F419 Anxiety disorder, unspecified: Secondary | ICD-10-CM | POA: Diagnosis not present

## 2021-06-12 ENCOUNTER — Ambulatory Visit: Payer: Medicare HMO | Admitting: Internal Medicine

## 2021-06-23 DIAGNOSIS — H2513 Age-related nuclear cataract, bilateral: Secondary | ICD-10-CM | POA: Diagnosis not present

## 2021-07-04 DIAGNOSIS — E669 Obesity, unspecified: Secondary | ICD-10-CM | POA: Diagnosis not present

## 2021-07-04 DIAGNOSIS — G894 Chronic pain syndrome: Secondary | ICD-10-CM | POA: Diagnosis not present

## 2021-07-04 DIAGNOSIS — M5136 Other intervertebral disc degeneration, lumbar region: Secondary | ICD-10-CM | POA: Diagnosis not present

## 2021-07-04 DIAGNOSIS — Z6837 Body mass index (BMI) 37.0-37.9, adult: Secondary | ICD-10-CM | POA: Diagnosis not present

## 2021-07-04 DIAGNOSIS — J22 Unspecified acute lower respiratory infection: Secondary | ICD-10-CM | POA: Diagnosis not present

## 2021-07-04 DIAGNOSIS — E538 Deficiency of other specified B group vitamins: Secondary | ICD-10-CM | POA: Diagnosis not present

## 2021-07-29 DIAGNOSIS — M5489 Other dorsalgia: Secondary | ICD-10-CM | POA: Diagnosis not present

## 2021-07-29 DIAGNOSIS — I10 Essential (primary) hypertension: Secondary | ICD-10-CM | POA: Diagnosis not present

## 2021-07-29 DIAGNOSIS — R110 Nausea: Secondary | ICD-10-CM | POA: Diagnosis not present

## 2021-07-30 ENCOUNTER — Emergency Department (HOSPITAL_COMMUNITY): Payer: Medicare HMO

## 2021-07-30 ENCOUNTER — Encounter (HOSPITAL_COMMUNITY): Payer: Self-pay

## 2021-07-30 ENCOUNTER — Other Ambulatory Visit: Payer: Self-pay

## 2021-07-30 ENCOUNTER — Emergency Department (HOSPITAL_COMMUNITY)
Admission: EM | Admit: 2021-07-30 | Discharge: 2021-07-30 | Disposition: A | Discharge status: Home / Self Care | Payer: Medicare HMO | Attending: Emergency Medicine | Admitting: Emergency Medicine

## 2021-07-30 DIAGNOSIS — R112 Nausea with vomiting, unspecified: Secondary | ICD-10-CM | POA: Diagnosis not present

## 2021-07-30 DIAGNOSIS — R0682 Tachypnea, not elsewhere classified: Secondary | ICD-10-CM | POA: Diagnosis not present

## 2021-07-30 DIAGNOSIS — R000 Tachycardia, unspecified: Secondary | ICD-10-CM | POA: Insufficient documentation

## 2021-07-30 DIAGNOSIS — I700 Atherosclerosis of aorta: Secondary | ICD-10-CM | POA: Diagnosis not present

## 2021-07-30 DIAGNOSIS — J449 Chronic obstructive pulmonary disease, unspecified: Secondary | ICD-10-CM | POA: Insufficient documentation

## 2021-07-30 DIAGNOSIS — R197 Diarrhea, unspecified: Secondary | ICD-10-CM | POA: Diagnosis not present

## 2021-07-30 DIAGNOSIS — R1111 Vomiting without nausea: Secondary | ICD-10-CM | POA: Diagnosis not present

## 2021-07-30 DIAGNOSIS — R1030 Lower abdominal pain, unspecified: Secondary | ICD-10-CM | POA: Diagnosis not present

## 2021-07-30 DIAGNOSIS — R062 Wheezing: Secondary | ICD-10-CM | POA: Insufficient documentation

## 2021-07-30 DIAGNOSIS — I10 Essential (primary) hypertension: Secondary | ICD-10-CM | POA: Insufficient documentation

## 2021-07-30 DIAGNOSIS — Z91040 Latex allergy status: Secondary | ICD-10-CM | POA: Diagnosis not present

## 2021-07-30 DIAGNOSIS — R0602 Shortness of breath: Secondary | ICD-10-CM | POA: Diagnosis not present

## 2021-07-30 DIAGNOSIS — Z20822 Contact with and (suspected) exposure to covid-19: Secondary | ICD-10-CM | POA: Diagnosis not present

## 2021-07-30 DIAGNOSIS — Z79899 Other long term (current) drug therapy: Secondary | ICD-10-CM | POA: Diagnosis not present

## 2021-07-30 DIAGNOSIS — K7460 Unspecified cirrhosis of liver: Secondary | ICD-10-CM | POA: Diagnosis not present

## 2021-07-30 DIAGNOSIS — R531 Weakness: Secondary | ICD-10-CM | POA: Diagnosis not present

## 2021-07-30 LAB — URINALYSIS, ROUTINE W REFLEX MICROSCOPIC
Bilirubin Urine: NEGATIVE
Glucose, UA: NEGATIVE mg/dL
Hgb urine dipstick: NEGATIVE
Ketones, ur: 5 mg/dL — AB
Leukocytes,Ua: NEGATIVE
Nitrite: NEGATIVE
Protein, ur: >=300 mg/dL — AB
Specific Gravity, Urine: 1.018 (ref 1.005–1.030)
pH: 5 (ref 5.0–8.0)

## 2021-07-30 LAB — COMPREHENSIVE METABOLIC PANEL
ALT: 23 U/L (ref 0–44)
AST: 39 U/L (ref 15–41)
Albumin: 3.9 g/dL (ref 3.5–5.0)
Alkaline Phosphatase: 308 U/L — ABNORMAL HIGH (ref 38–126)
Anion gap: 12 (ref 5–15)
BUN: 19 mg/dL (ref 6–20)
CO2: 20 mmol/L — ABNORMAL LOW (ref 22–32)
Calcium: 9.1 mg/dL (ref 8.9–10.3)
Chloride: 108 mmol/L (ref 98–111)
Creatinine, Ser: 1.08 mg/dL — ABNORMAL HIGH (ref 0.44–1.00)
GFR, Estimated: 59 mL/min — ABNORMAL LOW (ref >60)
Glucose, Bld: 144 mg/dL — ABNORMAL HIGH (ref 70–99)
Potassium: 3.7 mmol/L (ref 3.5–5.1)
Sodium: 140 mmol/L (ref 135–145)
Total Bilirubin: 1.2 mg/dL (ref 0.3–1.2)
Total Protein: 8.2 g/dL — ABNORMAL HIGH (ref 6.5–8.1)

## 2021-07-30 LAB — CBC WITH DIFFERENTIAL/PLATELET
Abs Immature Granulocytes: 0.08 10*3/uL — ABNORMAL HIGH (ref 0.00–0.07)
Basophils Absolute: 0.1 10*3/uL (ref 0.0–0.1)
Basophils Relative: 0 %
Eosinophils Absolute: 0 10*3/uL (ref 0.0–0.5)
Eosinophils Relative: 0 %
HCT: 40.4 % (ref 36.0–46.0)
Hemoglobin: 13.7 g/dL (ref 12.0–15.0)
Immature Granulocytes: 1 %
Lymphocytes Relative: 7 %
Lymphs Abs: 1.1 10*3/uL (ref 0.7–4.0)
MCH: 30 pg (ref 26.0–34.0)
MCHC: 33.9 g/dL (ref 30.0–36.0)
MCV: 88.6 fL (ref 80.0–100.0)
Monocytes Absolute: 0.8 10*3/uL (ref 0.1–1.0)
Monocytes Relative: 5 %
Neutro Abs: 14.9 10*3/uL — ABNORMAL HIGH (ref 1.7–7.7)
Neutrophils Relative %: 87 %
Platelets: 515 10*3/uL — ABNORMAL HIGH (ref 150–400)
RBC: 4.56 MIL/uL (ref 3.87–5.11)
RDW: 13.8 % (ref 11.5–15.5)
WBC: 16.9 10*3/uL — ABNORMAL HIGH (ref 4.0–10.5)
nRBC: 0 % (ref 0.0–0.2)

## 2021-07-30 LAB — MAGNESIUM: Magnesium: 1.7 mg/dL (ref 1.7–2.4)

## 2021-07-30 LAB — LIPASE, BLOOD: Lipase: 28 U/L (ref 11–51)

## 2021-07-30 LAB — RESP PANEL BY RT-PCR (FLU A&B, COVID) ARPGX2
Influenza A by PCR: NEGATIVE
Influenza B by PCR: NEGATIVE
SARS Coronavirus 2 by RT PCR: NEGATIVE

## 2021-07-30 MED ORDER — ONDANSETRON 4 MG PO TBDP: 4.0000 mg | ORAL_TABLET | Freq: Three times a day (TID) | ORAL | 0 refills | Status: DC | PRN

## 2021-07-30 MED ORDER — LIDOCAINE VISCOUS HCL 2 % MT SOLN
15.0000 mL | Freq: Once | OROMUCOSAL | Status: DC
Start: 2021-07-30 — End: 2021-07-30
  Administered 2021-07-30: 15 mL via ORAL
  Filled 2021-07-30: qty 15

## 2021-07-30 MED ORDER — ONDANSETRON HCL 4 MG/2ML IJ SOLN
4.0000 mg | Freq: Once | INTRAMUSCULAR | Status: AC
  Administered 2021-07-30: 4 mg via INTRAVENOUS
  Filled 2021-07-30: qty 2

## 2021-07-30 MED ORDER — IOHEXOL 300 MG/ML  SOLN
100.0000 mL | Freq: Once | INTRAMUSCULAR | Status: AC | PRN
  Administered 2021-07-30: 100 mL via INTRAVENOUS

## 2021-07-30 MED ORDER — ALUM & MAG HYDROXIDE-SIMETH 200-200-20 MG/5ML PO SUSP
30.0000 mL | Freq: Once | ORAL | Status: DC
  Administered 2021-07-30: 30 mL via ORAL
  Filled 2021-07-30: qty 30

## 2021-07-30 MED ORDER — DICYCLOMINE HCL 20 MG PO TABS: 20.0000 mg | ORAL_TABLET | Freq: Two times a day (BID) | ORAL | 0 refills | Status: DC

## 2021-07-30 MED ORDER — SODIUM CHLORIDE 0.9 % IV BOLUS
500.0000 mL | Freq: Once | INTRAVENOUS | Status: AC
  Administered 2021-07-30: 500 mL via INTRAVENOUS

## 2021-07-30 MED ORDER — FENTANYL CITRATE PF 50 MCG/ML IJ SOSY
12.5000 ug | PREFILLED_SYRINGE | Freq: Once | INTRAMUSCULAR | Status: AC
  Administered 2021-07-30: 12.5 ug via INTRAVENOUS
  Filled 2021-07-30: qty 1

## 2021-07-30 MED ORDER — DICYCLOMINE HCL 10 MG/ML IM SOLN
20.0000 mg | Freq: Once | INTRAMUSCULAR | Status: DC
Start: 2021-07-30 — End: 2021-07-30

## 2021-07-30 MED ORDER — SODIUM CHLORIDE 0.9 % IV BOLUS
1000.0000 mL | Freq: Once | INTRAVENOUS | Status: AC
  Administered 2021-07-30: 1000 mL via INTRAVENOUS

## 2021-07-30 NOTE — ED Notes (Signed)
Pt tolerating po without vomiting

## 2021-07-30 NOTE — ED Triage Notes (Signed)
Pt states n/v x3 days, family member at home with same.  Hx HTN, unable to take b/p meds.  207/114 per ems.  Pt arrives with continued nausea and dry heaves.  CBG 190, HR 123.

## 2021-07-30 NOTE — ED Notes (Signed)
Tolerating po, requests sprite

## 2021-07-30 NOTE — Discharge Instructions (Signed)
You can take Bentyl and Zofran for pain and nausea.  Please try to drink plenty of fluids including water, Gatorade, Powerade, or Pedialyte.  We will stick to a bland diet over the next week.  I would avoid fatty greasy foods as this will make your pain worse.  Please return to the emergency department for worsening symptoms.

## 2021-07-30 NOTE — ED Provider Notes (Signed)
[facility] EMERGENCY DEPARTMENT Provider Note   CSN: [id number] Arrival date & time: 07/30/21  2992     History Chief Complaint  Patient presents with   Emesis    [name] is a 61 y.o. female with history of COPD, hypertension who presents to the emergency department with 5-day history of diarrhea and intractable vomiting.  Patient states that her father had similar symptoms.  Patient states initially started with 2 days of diarrhea and then progressed to intractable vomiting.  Patient has been unable to tolerate any of her p.o. medications or fluids.  Patient having generalized abdominal cramping.  No fever or chills.  Emesis     Home Medications Prior to Admission medications   Medication Sig Start Date End Date Taking? Authorizing Provider  ALPRAZolam Duanne Moron) 1 MG tablet Take 1 mg by mouth 3 (three) times daily as needed for anxiety.   Yes [provider]  amLODipine (NORVASC) 5 MG tablet Take 1 tablet (5 mg total) by mouth daily. 02/13/21 07/30/21 Yes Shah, Pratik D, DO  baclofen (LIORESAL) 10 MG tablet Take 10 mg by mouth at bedtime. May take up to 3 times daily for Muscle spasms   Yes [provider]  Cholecalciferol (VITAMIN D3 PO) Take by mouth.   Yes [provider]  cyanocobalamin 1000 MCG tablet Take 1,000 mcg by mouth daily.   Yes [provider]  dicyclomine (BENTYL) 20 MG tablet Take 1 tablet (20 mg total) by mouth 2 (two) times daily. 07/30/21  Yes Raul Del, Kiri Hinderliter M, PA-C  DULoxetine (CYMBALTA) 30 MG capsule Take 30 mg by mouth daily. 01/09/21  Yes [provider]  DULoxetine (CYMBALTA) 60 MG capsule Take 60 mg by mouth at bedtime.   Yes [provider]  estradiol (ESTRACE) 1 MG tablet Take 1 mg by mouth daily.   Yes [provider]  feeding supplement (ENSURE ENLIVE / ENSURE PLUS) LIQD Take 237 mLs by mouth 2 (two) times daily between meals. Patient taking differently: Take 237 mLs by mouth 3 (three)  times daily between meals. 02/13/21  Yes Shah, Pratik D, DO  levothyroxine (SYNTHROID, LEVOTHROID) 25 MCG tablet Take 25 mcg by mouth daily. 10/30/16  Yes [provider]  lisinopril-hydrochlorothiazide (ZESTORETIC) 10-12.5 MG tablet Take 1 tablet by mouth daily. 07/10/21  Yes [provider]  metoCLOPramide (REGLAN) 5 MG tablet Take 1 tablet (5 mg total) by mouth 3 (three) times daily before meals. 02/13/21 07/30/21 Yes Shah, Pratik D, DO  metoprolol tartrate (LOPRESSOR) 25 MG tablet Take 1 tablet (25 mg total) by mouth 2 (two) times daily. 02/13/21 07/30/21 Yes Shah, Pratik D, DO  omeprazole (PRILOSEC) 20 MG capsule Take 20 mg by mouth at bedtime.    Yes [provider]  ondansetron (ZOFRAN-ODT) 4 MG disintegrating tablet Take 1 tablet (4 mg total) by mouth every 8 (eight) hours as needed for nausea or vomiting. 07/30/21  Yes Myna Bright M, PA-C  oxycodone (ROXICODONE) 30 MG immediate release tablet Take 30 mg by mouth 4 (four) times daily as needed. 02/09/21  Yes [provider]  promethazine (PHENERGAN) 25 MG tablet Take 25 mg by mouth every 6 (six) hours as needed. 02/03/21  Yes [provider]  promethazine (PHENERGAN) 50 MG tablet Take 50 mg by mouth 2 (two) times daily as needed. 07/14/21  Yes [provider]  pyridOXINE (VITAMIN B-6) 50 MG tablet Take 50 mg by mouth daily.   Yes [provider]  risperiDONE (RISPERDAL) 1 MG  tablet Take 1 mg by mouth 2 (two) times daily.   Yes [provider]  venlafaxine (EFFEXOR) 75 MG tablet Take 75 mg by mouth daily.   Yes [provider]  zolpidem (AMBIEN) 10 MG tablet Take 10 mg by mouth at bedtime.     Yes [provider]  furosemide (LASIX) 20 MG tablet Take 1 tablet (20 mg total) by mouth daily. Take with spironolactone Patient not taking: Reported on 07/30/2021 03/15/21   Annitta Needs, NP  spironolactone (ALDACTONE) 50 MG tablet Take 1 tablet (50 mg total) by mouth  daily. Take with lasix Patient not taking: Reported on 07/30/2021 03/15/21   Annitta Needs, NP      Allergies    Codeine, Latex, Lyrica [pregabalin], Nsaids, and Other    Review of Systems   Review of Systems  Gastrointestinal:  Positive for vomiting.  All other systems reviewed and are negative.  Physical Exam Updated Vital Signs BP (!) 189/91    Pulse (!) 110    Temp 98.1 F (36.7 C) (Oral)    Resp (!) 2    SpO2 98%  Physical Exam Vitals and nursing note reviewed.  Constitutional:      General: She is not in acute distress.    Appearance: Normal appearance.  HENT:     Head: Normocephalic and atraumatic.  Eyes:     General:        Right eye: No discharge.        Left eye: No discharge.  Cardiovascular:     Comments: Regular rate and rhythm.  S1/S2 are distinct without any evidence of murmur, rubs, or gallops.  Radial pulses are 2+ bilaterally.  Dorsalis pedis pulses are 2+ bilaterally.  No evidence of pedal edema. Pulmonary:     Comments: Wheezing in the bases bilaterally.  Normal effort.  No respiratory distress. Abdominal:     General: Abdomen is flat. Bowel sounds are normal. There is no distension.     Tenderness: There is no abdominal tenderness. There is no guarding or rebound.  Musculoskeletal:        General: Normal range of motion.     Cervical back: Neck supple.  Skin:    General: Skin is warm and dry.     Findings: No rash.  Neurological:     General: No focal deficit present.     Mental Status: She is alert.  Psychiatric:        Mood and Affect: Mood normal.        Behavior: Behavior normal.    ED Results / Procedures / Treatments   Labs (all labs ordered are listed, but only abnormal results are displayed) Labs Reviewed  CBC WITH DIFFERENTIAL/PLATELET - Abnormal; Notable for the following components:      Result Value   WBC 16.9 (*)    Platelets 515 (*)    Neutro Abs 14.9 (*)    Abs Immature Granulocytes 0.08 (*)    All other components within  normal limits  URINALYSIS, ROUTINE W REFLEX MICROSCOPIC - Abnormal; Notable for the following components:   APPearance HAZY (*)    Ketones, ur 5 (*)    Protein, ur >=300 (*)    Bacteria, UA RARE (*)    All other components within normal limits  COMPREHENSIVE METABOLIC PANEL - Abnormal; Notable for the following components:   CO2 20 (*)    Glucose, Bld 144 (*)    Creatinine, Ser 1.08 (*)    Total Protein  8.2 (*)    Alkaline Phosphatase 308 (*)    GFR, Estimated 59 (*)    All other components within normal limits  RESP PANEL BY RT-PCR (FLU A&B, COVID) ARPGX2  LIPASE, BLOOD  MAGNESIUM    EKG None  Radiology CT ABDOMEN PELVIS W CONTRAST  Result Date: 07/30/2021 CLINICAL DATA:  Abdominal pain and nausea and vomiting for 3 days. EXAM: CT ABDOMEN AND PELVIS WITH CONTRAST TECHNIQUE: Multidetector CT imaging of the abdomen and pelvis was performed using the standard protocol following bolus administration of intravenous contrast. RADIATION DOSE REDUCTION: This exam was performed according to the departmental dose-optimization program which includes automated exposure control, adjustment of the mA and/or kV according to patient size and/or use of iterative reconstruction technique. CONTRAST:  141m OMNIPAQUE IOHEXOL 300 MG/ML  SOLN COMPARISON:  02/10/2021 FINDINGS: Lower Chest: No acute findings. Hepatobiliary: Hepatic cirrhosis is again demonstrated. No hepatic masses identified. Recanalization of paraumbilical veins is again seen, consistent portal venous hypertension. Mild diffuse gallbladder wall thickening is unchanged and consistent with cirrhosis. Pancreas:  No mass or inflammatory changes. Spleen: Within normal limits in size and appearance. Adrenals/Urinary Tract: No masses identified. No evidence of ureteral calculi or hydronephrosis. Stomach/Bowel: Diffuse gastric and bowel wall thickening has significantly decreased since previous study, with residual bowel wall thickening seen involving  the duodenum. Decreased mesenteric edema is seen there has been near complete resolution of ascites since prior study. Vascular/Lymphatic: No pathologically enlarged lymph nodes. No acute vascular findings. Aortic atherosclerotic calcification noted. Reproductive: Prior hysterectomy noted. Adnexal regions are unremarkable in appearance. Other:  None. Musculoskeletal:  No suspicious bone lesions identified. IMPRESSION: No acute findings. Hepatic cirrhosis and findings of portal venous hypertension. No evidence of hepatic neoplasm. Significant decrease in diffuse gastric and bowel wall thickening, with residual bowel wall thickening only involving the duodenum. This is likely due to decreased portal enteropathy or hypoalbuminemia. Near complete resolution of ascites and mesenteric edema since prior study. Aortic Atherosclerosis (ICD10-I70.0). Electronically Signed   By: JMarlaine HindM.D.   On: 07/30/2021 15:42   DG Chest Port 1 View  Result Date: 07/30/2021 CLINICAL DATA:  Shortness of breath. Hypertension. Nausea and vomiting. EXAM: PORTABLE CHEST 1 VIEW COMPARISON:  02/10/2021 FINDINGS: The heart size and mediastinal contours are within normal limits. Both lungs are clear. The visualized skeletal structures are unremarkable. IMPRESSION: No active disease. Electronically Signed   By: JMarlaine HindM.D.   On: 07/30/2021 12:05    Procedures Procedures    Medications Ordered in ED Medications  dicyclomine (BENTYL) injection 20 mg (20 mg Intramuscular Not Given 07/30/21 1712)  ondansetron (ZOFRAN) injection 4 mg (4 mg Intravenous Given 07/30/21 1133)  sodium chloride 0.9 % bolus 1,000 mL (0 mLs Intravenous Stopped 07/30/21 1227)  fentaNYL (SUBLIMAZE) injection 12.5 mcg (12.5 mcg Intravenous Given 07/30/21 1448)  iohexol (OMNIPAQUE) 300 MG/ML solution 100 mL (100 mLs Intravenous Contrast Given 07/30/21 1505)  sodium chloride 0.9 % bolus 500 mL (0 mLs Intravenous Stopped 07/30/21 1644)  ondansetron (ZOFRAN)  injection 4 mg (4 mg Intravenous Given 07/30/21 1613)    ED Course/ Medical Decision Making/ A&P Clinical Course as of 07/30/21 1742  Sun Jul 30, 2021  1555 On reevaluation, patient states her back pain is significantly improved however she still having lower abdominal pain which she describes as a cramping sensation.  Patient is still nauseous.  Second fluid bolus is running.  We will give her another round of Zofran and GI cocktail she is having some burning  sensation in the epigastric region. [CF]    Clinical Course User Index [CF] Hendricks Limes, PA-C                           Medical Decision Making Amount and/or Complexity of Data Reviewed Labs: ordered. Radiology: ordered.  Risk Prescription drug management.   This patient presents to the ED for concern of intractable vomiting, this involves an extensive number of treatment options, and is a complaint that carries with it a high risk of complications and morbidity.  The differential diagnosis includes gastroenteritis, biliary disease, pancreatitis, gastritis.  I have a low suspicion for ischemic bowel, diverticulitis, appendicitis as abdomen is nontender to palpation.   Co morbidities that complicate the patient evaluation  Hypertension Hyperlipidemia   Additional history obtained:  Additional history obtained from old records External records from outside source obtained and reviewed including discharge summary from 02/13/2021.  No relevant documentation to her care today however, patient was admitted for hypertensive crisis at that time.   Lab Tests:  I Ordered, and personally interpreted labs.  The pertinent results include: CBC shows leukocytosis and thrombocytosis.  CMP shows elevated alk phos and slight bump in her creatinine compared to normal.  No evidence of AKI today.  Lipase negative.  Mag normal.  Urinalysis shows no signs of infection.  COVID and flu negative.   Imaging Studies ordered:  I ordered  imaging studies including chest x-ray and CT abdomen pelvis with contrast I independently visualized and interpreted imaging which showed no acute findings of the chest.  CT abdomen without any signs of appendicitis, diverticulitis or any other emergent bowel pathology. I agree with the radiologist interpretation   Cardiac Monitoring:  The patient was maintained on a cardiac monitor.  I personally viewed and interpreted the cardiac monitored which showed an underlying rhythm of: Normal sinus rhythm   Medicines ordered and prescription drug management:  I ordered medication including fluid bolus x2 for dehydration.  Zofran x2 for nausea.  GI cocktail. Reevaluation of the patient after these medicines showed that the patient improved I have reviewed the patients home medicines and have made adjustments as needed   Critical Interventions:  Fluid bolus for dehydration Zofran for nausea GI cocktail for epigastric abdominal pain   Problem List / ED Course:  Intractable nausea, vomiting, diarrhea likely gastroenteritis.  Patient came in tachycardic likely from hypovolemia.  She has been afebrile in the department today.  Patient still having persistent tachycardia although improved.  Mild tachypnea.  Work-up today is reassuring for any emergent acute pathology.  Labs support infectious etiology likely GI source.  CT abdomen did not show signs of appendicitis, diverticulitis, cholecystitis.  Patient feeling better after GI cocktail, fluids, and Zofran.  I will prescribe her Bentyl and Zofran to go home with.  Patient comfortable and amenable to this plan.  I discussed strict return precautions with the patient.  She is safe for discharge.   Reevaluation:  After the interventions noted above, I reevaluated the patient and found that they have :improved   Social Determinants of Health:  Lives at home with chronically ill father   Dispostion:  After consideration of the diagnostic  results and the patients response to treatment, I feel that the patent would benefit from outpatient follow-up.  Zofran and Bentyl sent to pharmacy.  She safer discharge.   Final Clinical Impression(s) / ED Diagnoses Final diagnoses:  Nausea vomiting and diarrhea  Rx / DC Orders ED Discharge Orders          Ordered    dicyclomine (BENTYL) 20 MG tablet  2 times daily        07/30/21 1741    ondansetron (ZOFRAN-ODT) 4 MG disintegrating tablet  Every 8 hours PRN        07/30/21 1741              Hendricks Limes, PA-C 07/30/21 1742    Milton Ferguson, MD 07/31/21 [phone]

## 2021-07-30 NOTE — ED Notes (Signed)
Pt tolerated GI cocktail, no further vomiting, trying po challenge with ice water.

## 2021-08-03 DIAGNOSIS — G894 Chronic pain syndrome: Secondary | ICD-10-CM | POA: Diagnosis not present

## 2021-08-03 DIAGNOSIS — K219 Gastro-esophageal reflux disease without esophagitis: Secondary | ICD-10-CM | POA: Diagnosis not present

## 2021-08-03 DIAGNOSIS — A084 Viral intestinal infection, unspecified: Secondary | ICD-10-CM | POA: Diagnosis not present

## 2021-08-03 DIAGNOSIS — M5136 Other intervertebral disc degeneration, lumbar region: Secondary | ICD-10-CM | POA: Diagnosis not present

## 2021-09-04 ENCOUNTER — Other Ambulatory Visit (HOSPITAL_COMMUNITY): Payer: Self-pay | Admitting: Physician Assistant

## 2021-09-04 DIAGNOSIS — N644 Mastodynia: Secondary | ICD-10-CM | POA: Diagnosis not present

## 2021-09-04 DIAGNOSIS — M5136 Other intervertebral disc degeneration, lumbar region: Secondary | ICD-10-CM | POA: Diagnosis not present

## 2021-09-04 DIAGNOSIS — E538 Deficiency of other specified B group vitamins: Secondary | ICD-10-CM | POA: Diagnosis not present

## 2021-09-04 DIAGNOSIS — F419 Anxiety disorder, unspecified: Secondary | ICD-10-CM | POA: Diagnosis not present

## 2021-09-04 DIAGNOSIS — G894 Chronic pain syndrome: Secondary | ICD-10-CM | POA: Diagnosis not present

## 2021-09-04 DIAGNOSIS — E6609 Other obesity due to excess calories: Secondary | ICD-10-CM | POA: Diagnosis not present

## 2021-09-04 DIAGNOSIS — Z6835 Body mass index (BMI) 35.0-35.9, adult: Secondary | ICD-10-CM | POA: Diagnosis not present

## 2021-09-05 ENCOUNTER — Other Ambulatory Visit (HOSPITAL_COMMUNITY): Payer: Self-pay | Admitting: Physician Assistant

## 2021-09-05 DIAGNOSIS — N644 Mastodynia: Secondary | ICD-10-CM

## 2021-09-26 DIAGNOSIS — H2512 Age-related nuclear cataract, left eye: Secondary | ICD-10-CM | POA: Diagnosis not present

## 2021-09-26 DIAGNOSIS — H43393 Other vitreous opacities, bilateral: Secondary | ICD-10-CM | POA: Diagnosis not present

## 2021-10-02 DIAGNOSIS — F3181 Bipolar II disorder: Secondary | ICD-10-CM | POA: Diagnosis not present

## 2021-10-02 DIAGNOSIS — K7460 Unspecified cirrhosis of liver: Secondary | ICD-10-CM | POA: Diagnosis not present

## 2021-10-02 DIAGNOSIS — F419 Anxiety disorder, unspecified: Secondary | ICD-10-CM | POA: Diagnosis not present

## 2021-10-02 DIAGNOSIS — Z6835 Body mass index (BMI) 35.0-35.9, adult: Secondary | ICD-10-CM | POA: Diagnosis not present

## 2021-10-02 DIAGNOSIS — G894 Chronic pain syndrome: Secondary | ICD-10-CM | POA: Diagnosis not present

## 2021-10-02 DIAGNOSIS — Z1331 Encounter for screening for depression: Secondary | ICD-10-CM | POA: Diagnosis not present

## 2021-10-02 DIAGNOSIS — E538 Deficiency of other specified B group vitamins: Secondary | ICD-10-CM | POA: Diagnosis not present

## 2021-10-02 DIAGNOSIS — Z0001 Encounter for general adult medical examination with abnormal findings: Secondary | ICD-10-CM | POA: Diagnosis not present

## 2021-10-02 DIAGNOSIS — I10 Essential (primary) hypertension: Secondary | ICD-10-CM | POA: Diagnosis not present

## 2021-10-02 DIAGNOSIS — G43009 Migraine without aura, not intractable, without status migrainosus: Secondary | ICD-10-CM | POA: Diagnosis not present

## 2021-10-02 DIAGNOSIS — E6609 Other obesity due to excess calories: Secondary | ICD-10-CM | POA: Diagnosis not present

## 2021-11-14 ENCOUNTER — Ambulatory Visit (HOSPITAL_COMMUNITY)
Admission: RE | Admit: 2021-11-14 | Discharge: 2021-11-14 | Disposition: A | Discharge status: Home / Self Care | Payer: Medicare HMO | Source: Ambulatory Visit | Attending: Internal Medicine | Admitting: Internal Medicine

## 2021-11-14 ENCOUNTER — Other Ambulatory Visit (HOSPITAL_COMMUNITY): Payer: Self-pay | Admitting: Internal Medicine

## 2021-11-14 DIAGNOSIS — M549 Dorsalgia, unspecified: Secondary | ICD-10-CM

## 2021-11-14 DIAGNOSIS — M545 Low back pain, unspecified: Secondary | ICD-10-CM | POA: Diagnosis not present

## 2021-11-14 DIAGNOSIS — E063 Autoimmune thyroiditis: Secondary | ICD-10-CM | POA: Diagnosis not present

## 2021-11-14 DIAGNOSIS — G894 Chronic pain syndrome: Secondary | ICD-10-CM | POA: Diagnosis not present

## 2021-11-14 DIAGNOSIS — E538 Deficiency of other specified B group vitamins: Secondary | ICD-10-CM | POA: Diagnosis not present

## 2021-11-14 DIAGNOSIS — M5136 Other intervertebral disc degeneration, lumbar region: Secondary | ICD-10-CM | POA: Diagnosis not present

## 2021-11-14 DIAGNOSIS — I10 Essential (primary) hypertension: Secondary | ICD-10-CM | POA: Diagnosis not present

## 2021-11-14 DIAGNOSIS — D45 Polycythemia vera: Secondary | ICD-10-CM | POA: Diagnosis not present

## 2021-11-14 DIAGNOSIS — Z6836 Body mass index (BMI) 36.0-36.9, adult: Secondary | ICD-10-CM | POA: Diagnosis not present

## 2021-11-14 DIAGNOSIS — D473 Essential (hemorrhagic) thrombocythemia: Secondary | ICD-10-CM | POA: Diagnosis not present

## 2021-11-14 DIAGNOSIS — F3181 Bipolar II disorder: Secondary | ICD-10-CM | POA: Diagnosis not present

## 2021-12-13 DIAGNOSIS — D45 Polycythemia vera: Secondary | ICD-10-CM | POA: Diagnosis not present

## 2021-12-13 DIAGNOSIS — G894 Chronic pain syndrome: Secondary | ICD-10-CM | POA: Diagnosis not present

## 2021-12-13 DIAGNOSIS — E063 Autoimmune thyroiditis: Secondary | ICD-10-CM | POA: Diagnosis not present

## 2021-12-13 DIAGNOSIS — Z6835 Body mass index (BMI) 35.0-35.9, adult: Secondary | ICD-10-CM | POA: Diagnosis not present

## 2021-12-13 DIAGNOSIS — M5136 Other intervertebral disc degeneration, lumbar region: Secondary | ICD-10-CM | POA: Diagnosis not present

## 2022-01-05 ENCOUNTER — Other Ambulatory Visit (HOSPITAL_COMMUNITY): Payer: Self-pay | Admitting: Internal Medicine

## 2022-01-05 ENCOUNTER — Other Ambulatory Visit: Payer: Self-pay | Admitting: Internal Medicine

## 2022-01-05 DIAGNOSIS — M51369 Other intervertebral disc degeneration, lumbar region without mention of lumbar back pain or lower extremity pain: Secondary | ICD-10-CM

## 2022-01-05 DIAGNOSIS — M549 Dorsalgia, unspecified: Secondary | ICD-10-CM

## 2022-01-05 DIAGNOSIS — M5136 Other intervertebral disc degeneration, lumbar region: Secondary | ICD-10-CM

## 2022-01-05 DIAGNOSIS — M25552 Pain in left hip: Secondary | ICD-10-CM

## 2022-01-09 ENCOUNTER — Ambulatory Visit (HOSPITAL_COMMUNITY)
Admission: RE | Admit: 2022-01-09 | Discharge: 2022-01-09 | Disposition: A | Discharge status: Home / Self Care | Payer: Medicare HMO | Source: Ambulatory Visit | Attending: Internal Medicine | Admitting: Internal Medicine

## 2022-01-09 ENCOUNTER — Other Ambulatory Visit (HOSPITAL_COMMUNITY): Payer: Self-pay | Admitting: Internal Medicine

## 2022-01-09 DIAGNOSIS — M25552 Pain in left hip: Secondary | ICD-10-CM

## 2022-01-09 DIAGNOSIS — M545 Low back pain, unspecified: Secondary | ICD-10-CM | POA: Insufficient documentation

## 2022-01-09 DIAGNOSIS — G894 Chronic pain syndrome: Secondary | ICD-10-CM | POA: Diagnosis not present

## 2022-01-09 DIAGNOSIS — M25551 Pain in right hip: Secondary | ICD-10-CM | POA: Diagnosis not present

## 2022-01-09 DIAGNOSIS — E538 Deficiency of other specified B group vitamins: Secondary | ICD-10-CM | POA: Diagnosis not present

## 2022-01-09 DIAGNOSIS — Z6837 Body mass index (BMI) 37.0-37.9, adult: Secondary | ICD-10-CM | POA: Diagnosis not present

## 2022-01-09 DIAGNOSIS — E6609 Other obesity due to excess calories: Secondary | ICD-10-CM | POA: Diagnosis not present

## 2022-01-09 DIAGNOSIS — M5136 Other intervertebral disc degeneration, lumbar region: Secondary | ICD-10-CM | POA: Diagnosis not present

## 2022-02-12 ENCOUNTER — Ambulatory Visit (HOSPITAL_COMMUNITY)
Admission: RE | Admit: 2022-02-12 | Discharge: 2022-02-12 | Disposition: A | Discharge status: Home / Self Care | Payer: Medicare HMO | Source: Ambulatory Visit | Attending: Internal Medicine | Admitting: Internal Medicine

## 2022-02-12 DIAGNOSIS — M25551 Pain in right hip: Secondary | ICD-10-CM | POA: Diagnosis not present

## 2022-02-12 DIAGNOSIS — M549 Dorsalgia, unspecified: Secondary | ICD-10-CM | POA: Diagnosis not present

## 2022-02-12 DIAGNOSIS — M5136 Other intervertebral disc degeneration, lumbar region: Secondary | ICD-10-CM | POA: Diagnosis not present

## 2022-02-12 DIAGNOSIS — M25552 Pain in left hip: Secondary | ICD-10-CM | POA: Diagnosis not present

## 2022-02-12 DIAGNOSIS — M5126 Other intervertebral disc displacement, lumbar region: Secondary | ICD-10-CM | POA: Diagnosis not present

## 2022-03-29 DIAGNOSIS — F5101 Primary insomnia: Secondary | ICD-10-CM | POA: Diagnosis not present

## 2022-03-29 DIAGNOSIS — I10 Essential (primary) hypertension: Secondary | ICD-10-CM | POA: Diagnosis not present

## 2022-03-29 DIAGNOSIS — F3181 Bipolar II disorder: Secondary | ICD-10-CM | POA: Diagnosis not present

## 2022-03-29 DIAGNOSIS — G894 Chronic pain syndrome: Secondary | ICD-10-CM | POA: Diagnosis not present

## 2022-03-29 DIAGNOSIS — M25551 Pain in right hip: Secondary | ICD-10-CM | POA: Diagnosis not present

## 2022-03-29 DIAGNOSIS — E063 Autoimmune thyroiditis: Secondary | ICD-10-CM | POA: Diagnosis not present

## 2022-03-29 DIAGNOSIS — E538 Deficiency of other specified B group vitamins: Secondary | ICD-10-CM | POA: Diagnosis not present

## 2022-03-29 DIAGNOSIS — Z6835 Body mass index (BMI) 35.0-35.9, adult: Secondary | ICD-10-CM | POA: Diagnosis not present

## 2022-03-29 DIAGNOSIS — M5136 Other intervertebral disc degeneration, lumbar region: Secondary | ICD-10-CM | POA: Diagnosis not present

## 2022-03-29 DIAGNOSIS — Z23 Encounter for immunization: Secondary | ICD-10-CM | POA: Diagnosis not present

## 2022-03-29 DIAGNOSIS — T50905A Adverse effect of unspecified drugs, medicaments and biological substances, initial encounter: Secondary | ICD-10-CM | POA: Diagnosis not present

## 2022-04-03 DIAGNOSIS — E782 Mixed hyperlipidemia: Secondary | ICD-10-CM | POA: Diagnosis not present

## 2022-04-03 DIAGNOSIS — K219 Gastro-esophageal reflux disease without esophagitis: Secondary | ICD-10-CM | POA: Diagnosis not present

## 2022-04-03 DIAGNOSIS — I10 Essential (primary) hypertension: Secondary | ICD-10-CM | POA: Diagnosis not present

## 2022-05-01 ENCOUNTER — Encounter: Payer: Self-pay | Admitting: Internal Medicine

## 2022-05-01 ENCOUNTER — Other Ambulatory Visit (HOSPITAL_COMMUNITY): Payer: Self-pay | Admitting: Internal Medicine

## 2022-05-01 DIAGNOSIS — R7989 Other specified abnormal findings of blood chemistry: Secondary | ICD-10-CM

## 2022-05-01 DIAGNOSIS — R1011 Right upper quadrant pain: Secondary | ICD-10-CM | POA: Diagnosis not present

## 2022-05-01 DIAGNOSIS — M5136 Other intervertebral disc degeneration, lumbar region: Secondary | ICD-10-CM | POA: Diagnosis not present

## 2022-05-01 DIAGNOSIS — I10 Essential (primary) hypertension: Secondary | ICD-10-CM | POA: Diagnosis not present

## 2022-05-01 DIAGNOSIS — Z6834 Body mass index (BMI) 34.0-34.9, adult: Secondary | ICD-10-CM | POA: Diagnosis not present

## 2022-05-01 DIAGNOSIS — M25551 Pain in right hip: Secondary | ICD-10-CM | POA: Diagnosis not present

## 2022-05-01 DIAGNOSIS — D45 Polycythemia vera: Secondary | ICD-10-CM | POA: Diagnosis not present

## 2022-05-01 DIAGNOSIS — R634 Abnormal weight loss: Secondary | ICD-10-CM

## 2022-05-01 DIAGNOSIS — E538 Deficiency of other specified B group vitamins: Secondary | ICD-10-CM | POA: Diagnosis not present

## 2022-05-04 DIAGNOSIS — R7989 Other specified abnormal findings of blood chemistry: Secondary | ICD-10-CM | POA: Diagnosis not present

## 2022-05-04 DIAGNOSIS — D45 Polycythemia vera: Secondary | ICD-10-CM | POA: Diagnosis not present

## 2022-05-04 DIAGNOSIS — E559 Vitamin D deficiency, unspecified: Secondary | ICD-10-CM | POA: Diagnosis not present

## 2022-05-04 DIAGNOSIS — R634 Abnormal weight loss: Secondary | ICD-10-CM | POA: Diagnosis not present

## 2022-05-09 ENCOUNTER — Encounter (HOSPITAL_COMMUNITY): Payer: Self-pay

## 2022-05-09 ENCOUNTER — Ambulatory Visit (HOSPITAL_COMMUNITY): Payer: Medicare HMO | Attending: Internal Medicine

## 2022-05-14 ENCOUNTER — Other Ambulatory Visit (HOSPITAL_COMMUNITY): Payer: Self-pay | Admitting: Internal Medicine

## 2022-05-14 DIAGNOSIS — R7989 Other specified abnormal findings of blood chemistry: Secondary | ICD-10-CM

## 2022-05-14 DIAGNOSIS — R634 Abnormal weight loss: Secondary | ICD-10-CM

## 2022-05-14 DIAGNOSIS — R1011 Right upper quadrant pain: Secondary | ICD-10-CM

## 2022-05-15 IMAGING — CT CT ABD-PELV W/ CM
2 of 5 series · 16 of 46 positions shown, 18 images · IV contrast (Omnipaque or Isovue)
Comparison: 02/10/2021

CLINICAL DATA: Abdominal pain and nausea and vomiting for 3 days.

EXAM:
CT ABDOMEN AND PELVIS WITH CONTRAST
TECHNIQUE: Multidetector CT imaging of the abdomen and pelvis was performed
using the standard protocol following bolus administration of
intravenous contrast.

[Series 2: axial st · axial · 0.98mm/px · z∈[+1400,+1835]mm · 13 of 99 slices shown, 15 images]
[im 6/99  soft-tissue]
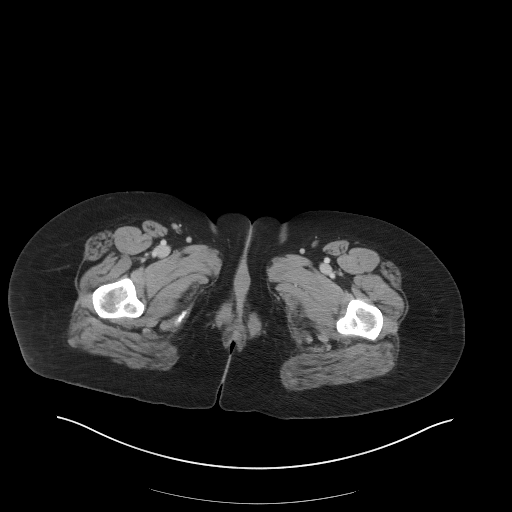
[im 6/99  bone]
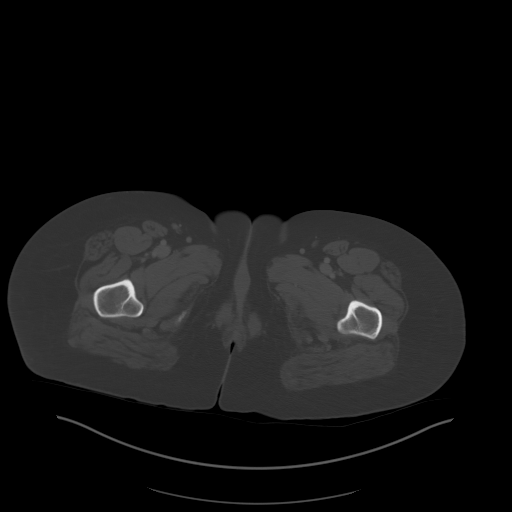
[im 11/99  soft-tissue]
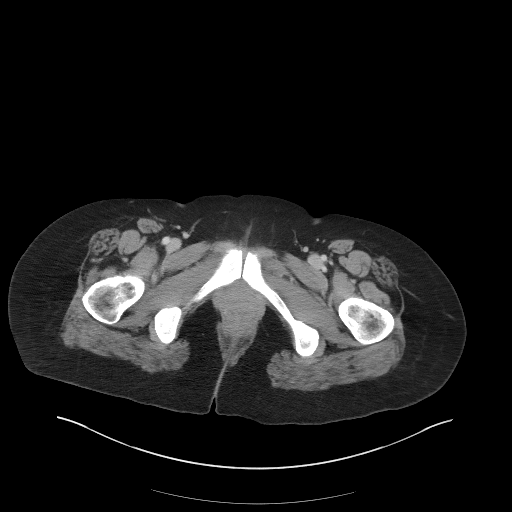
[im 22/99  soft-tissue]
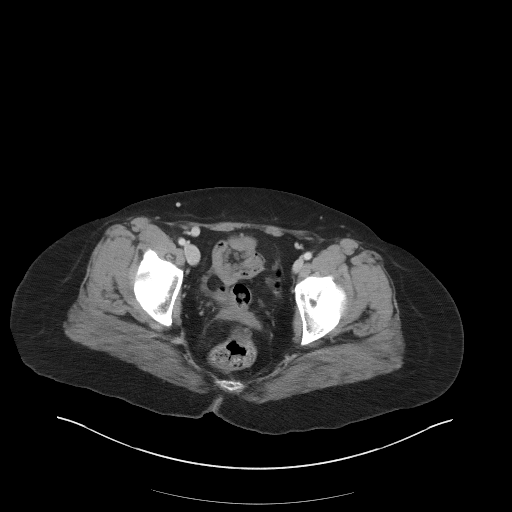
[im 28/99  soft-tissue]
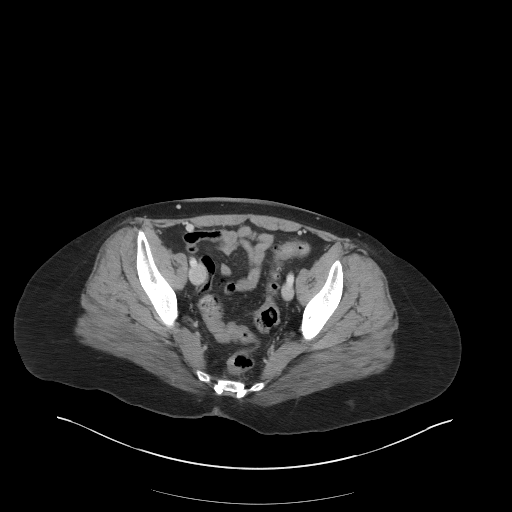
[im 33/99  soft-tissue]
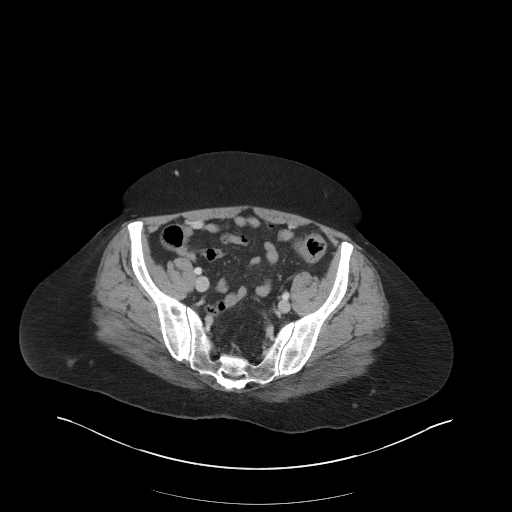
[im 44/99  soft-tissue]
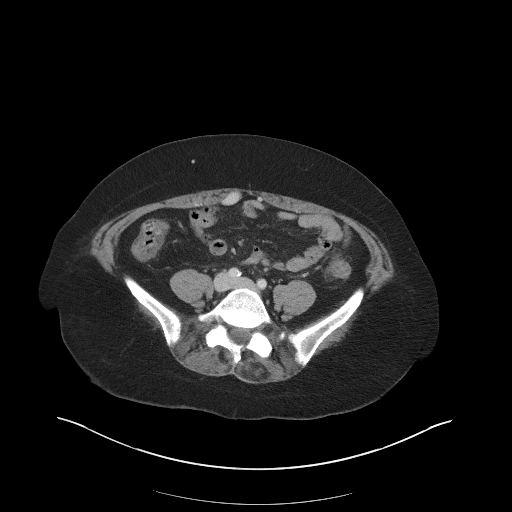
[im 50/99  soft-tissue]
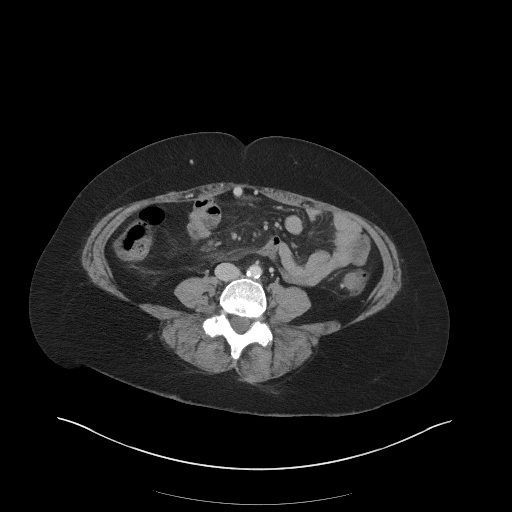
[im 55/99  soft-tissue]
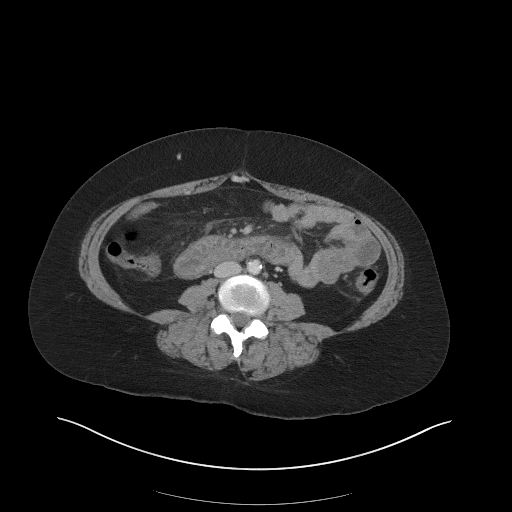
[im 66/99  soft-tissue]
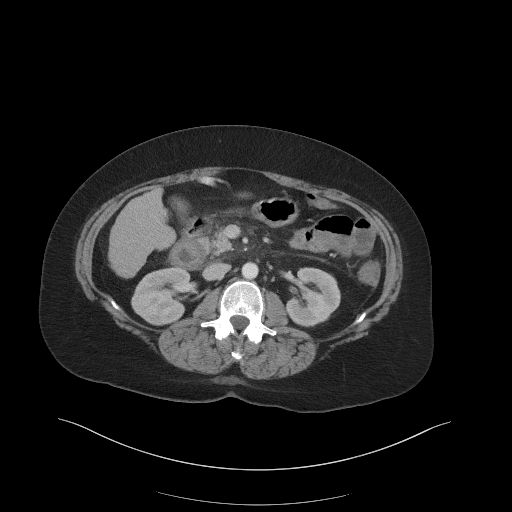
[im 66/99  bone]
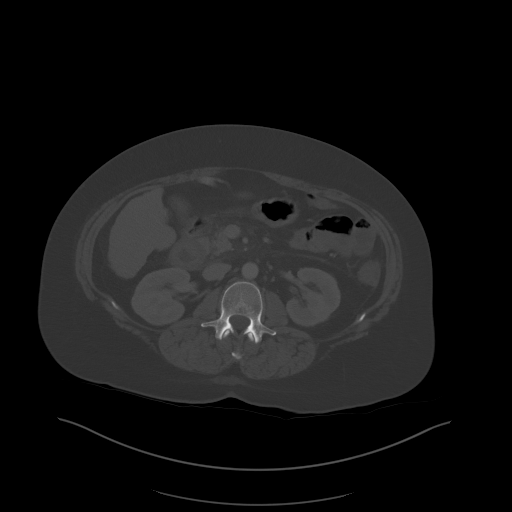
[im 71/99  soft-tissue]
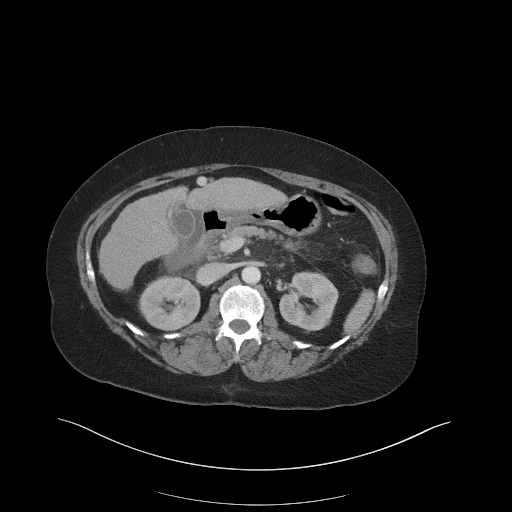
[im 77/99  soft-tissue]
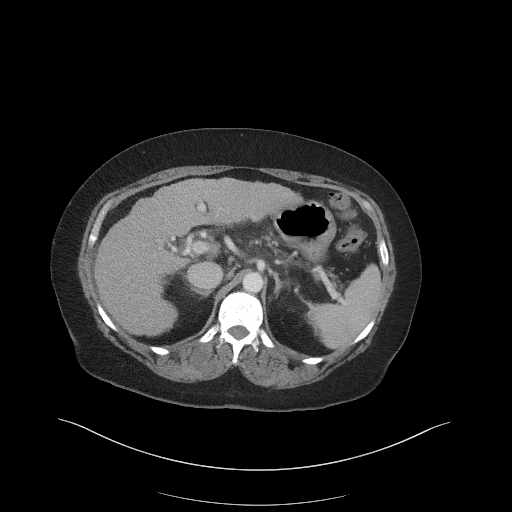
[im 88/99  soft-tissue]
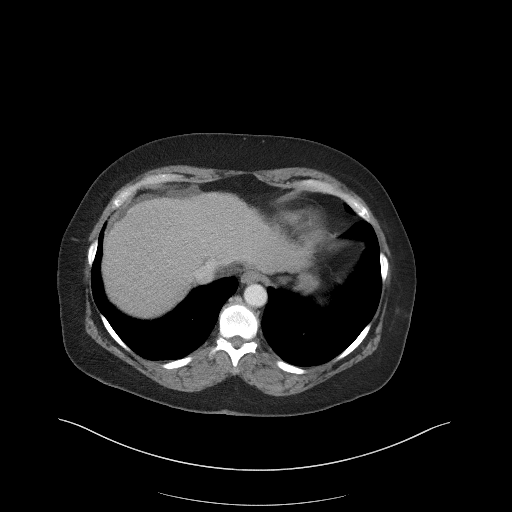
[im 93/99  soft-tissue]
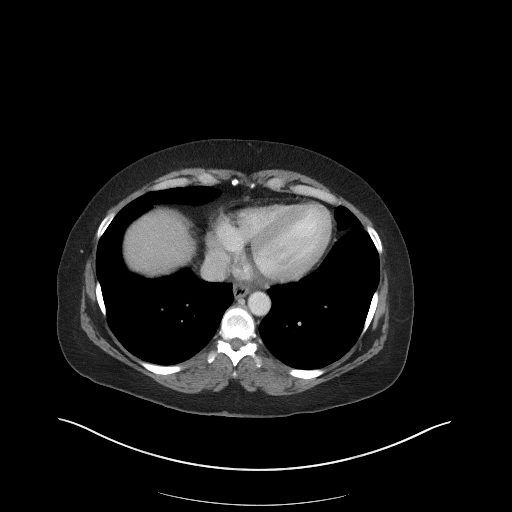

[Series 5: coronal st · coronal · 0.90mm/px · 3 of 105 slices shown]
[im 35/105  soft-tissue]
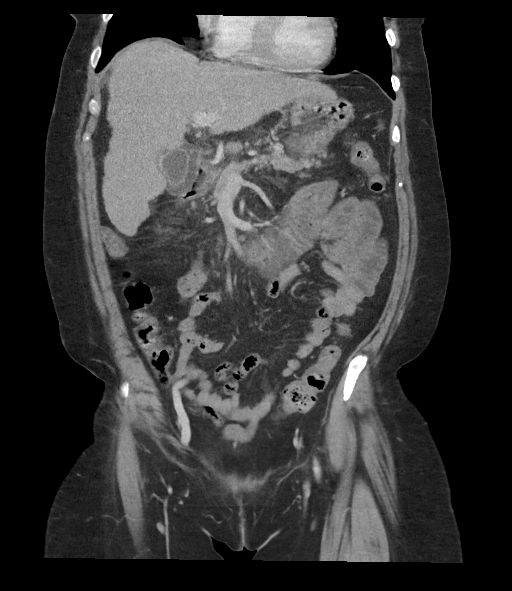
[im 47/105  soft-tissue]
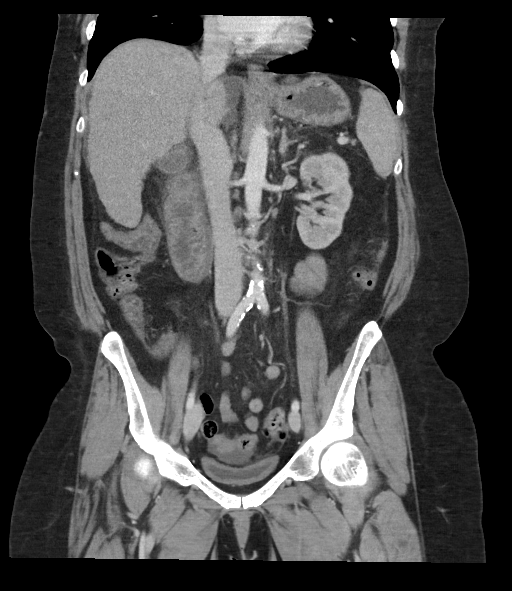
[im 58/105  soft-tissue]
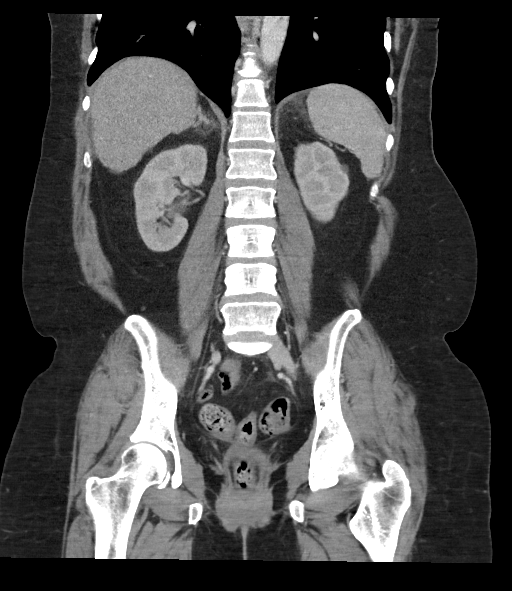

[16 of 46 positions shown; findings below may reference images not displayed]

RADIATION DOSE REDUCTION: This exam was performed according to the
departmental dose-optimization program which includes automated
exposure control, adjustment of the mA and/or kV according to
patient size and/or use of iterative reconstruction technique.

CONTRAST:  100mL OMNIPAQUE IOHEXOL 300 MG/ML  SOLN
FINDINGS: Lower Chest: No acute findings.

Hepatobiliary: Hepatic cirrhosis is again demonstrated. No hepatic
masses identified. Recanalization of paraumbilical veins is again
seen, consistent portal venous hypertension. Mild diffuse
gallbladder wall thickening is unchanged and consistent with
cirrhosis.

Pancreas:  No mass or inflammatory changes.

Spleen: Within normal limits in size and appearance.

Adrenals/Urinary Tract: No masses identified. No evidence of
ureteral calculi or hydronephrosis.

Stomach/Bowel: Diffuse gastric and bowel wall thickening has
significantly decreased since previous study, with residual bowel
wall thickening seen involving the duodenum. Decreased mesenteric
edema is seen there has been near complete resolution of ascites
since prior study.

Vascular/Lymphatic: No pathologically enlarged lymph nodes. No acute
vascular findings. Aortic atherosclerotic calcification noted.

Reproductive: Prior hysterectomy noted. Adnexal regions are
unremarkable in appearance.

Other:  None.

Musculoskeletal:  No suspicious bone lesions identified.
IMPRESSION: No acute findings.

Hepatic cirrhosis and findings of portal venous hypertension. No
evidence of hepatic neoplasm.

Significant decrease in diffuse gastric and bowel wall thickening,
with residual bowel wall thickening only involving the duodenum.
This is likely due to decreased portal enteropathy or
hypoalbuminemia.

Near complete resolution of ascites and mesenteric edema since prior
study.

Aortic Atherosclerosis ([id number]).

## 2022-05-15 IMAGING — DX DG CHEST 1V PORT
1 series · 1 of 1 positions shown · non-contrast
Comparison: 02/10/2021

CLINICAL DATA: Shortness of breath. Hypertension. Nausea and
vomiting.

EXAM:
PORTABLE CHEST 1 VIEW

[chest ap]
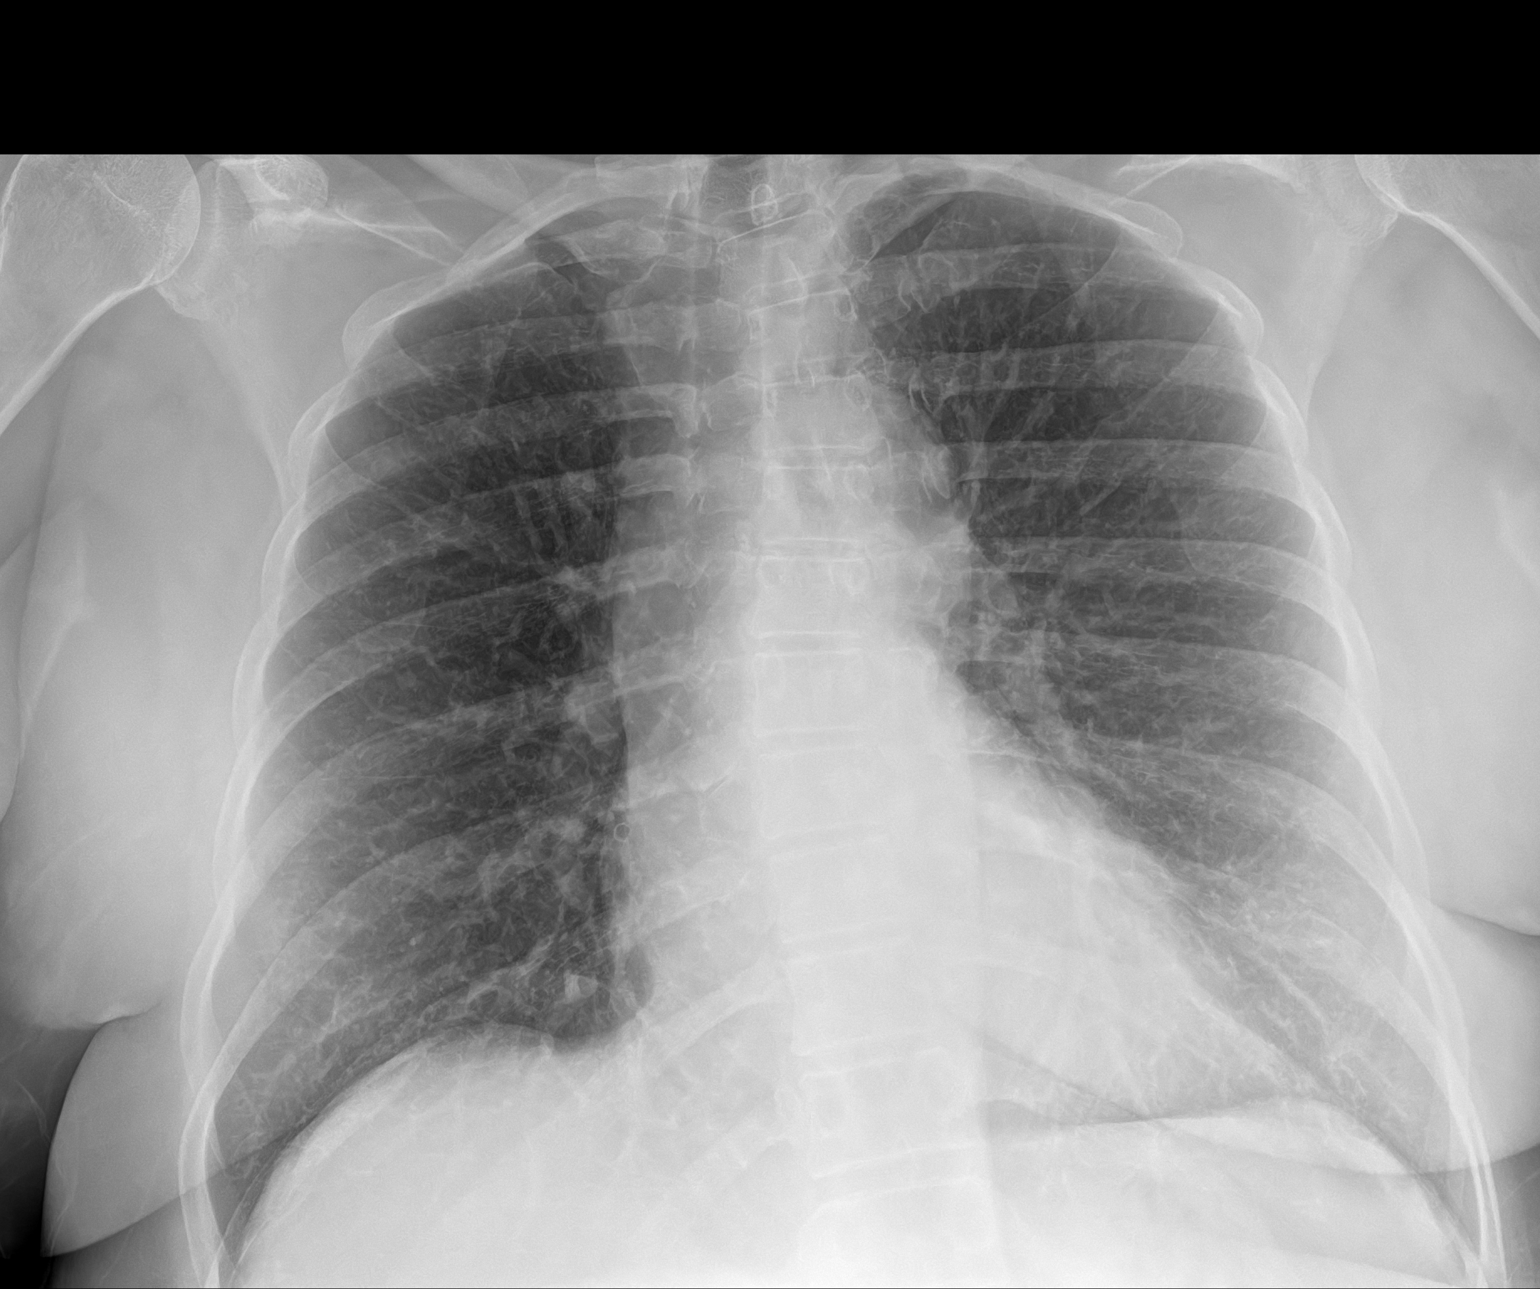

[1 of 1 positions shown; findings below may reference images not displayed]

FINDINGS: The heart size and mediastinal contours are within normal limits.
Both lungs are clear. The visualized skeletal structures are
unremarkable.
IMPRESSION: No active disease.

## 2022-05-25 ENCOUNTER — Ambulatory Visit (HOSPITAL_COMMUNITY)
Admission: RE | Admit: 2022-05-25 | Discharge: 2022-05-25 | Disposition: A | Discharge status: Home / Self Care | Payer: Medicare HMO | Source: Ambulatory Visit | Attending: Internal Medicine | Admitting: Internal Medicine

## 2022-05-25 DIAGNOSIS — R1011 Right upper quadrant pain: Secondary | ICD-10-CM | POA: Insufficient documentation

## 2022-05-25 DIAGNOSIS — R634 Abnormal weight loss: Secondary | ICD-10-CM | POA: Diagnosis not present

## 2022-05-25 DIAGNOSIS — R7989 Other specified abnormal findings of blood chemistry: Secondary | ICD-10-CM | POA: Diagnosis not present

## 2022-05-31 ENCOUNTER — Ambulatory Visit (HOSPITAL_COMMUNITY): Admission: RE | Admit: 2022-05-31 | Payer: Medicare HMO | Source: Ambulatory Visit

## 2022-05-31 DIAGNOSIS — K7469 Other cirrhosis of liver: Secondary | ICD-10-CM | POA: Diagnosis not present

## 2022-05-31 DIAGNOSIS — Z6834 Body mass index (BMI) 34.0-34.9, adult: Secondary | ICD-10-CM | POA: Diagnosis not present

## 2022-05-31 DIAGNOSIS — E2749 Other adrenocortical insufficiency: Secondary | ICD-10-CM | POA: Diagnosis not present

## 2022-05-31 DIAGNOSIS — F419 Anxiety disorder, unspecified: Secondary | ICD-10-CM | POA: Diagnosis not present

## 2022-05-31 DIAGNOSIS — R1011 Right upper quadrant pain: Secondary | ICD-10-CM | POA: Diagnosis not present

## 2022-05-31 DIAGNOSIS — I10 Essential (primary) hypertension: Secondary | ICD-10-CM | POA: Diagnosis not present

## 2022-05-31 DIAGNOSIS — M5136 Other intervertebral disc degeneration, lumbar region: Secondary | ICD-10-CM | POA: Diagnosis not present

## 2022-05-31 DIAGNOSIS — E6609 Other obesity due to excess calories: Secondary | ICD-10-CM | POA: Diagnosis not present

## 2022-05-31 DIAGNOSIS — E538 Deficiency of other specified B group vitamins: Secondary | ICD-10-CM | POA: Diagnosis not present

## 2022-06-06 ENCOUNTER — Encounter: Payer: Self-pay | Admitting: *Deleted

## 2022-06-06 ENCOUNTER — Encounter: Payer: Self-pay | Admitting: Gastroenterology

## 2022-06-06 ENCOUNTER — Ambulatory Visit (INDEPENDENT_AMBULATORY_CARE_PROVIDER_SITE_OTHER): Payer: Medicare HMO | Admitting: Gastroenterology

## 2022-06-06 ENCOUNTER — Telehealth: Payer: Self-pay

## 2022-06-06 VITALS — BP 102/66 | HR 88 | Temp 98.0°F | Ht 66.0 in | Wt 201.8 lb

## 2022-06-06 DIAGNOSIS — R1011 Right upper quadrant pain: Secondary | ICD-10-CM

## 2022-06-06 DIAGNOSIS — K219 Gastro-esophageal reflux disease without esophagitis: Secondary | ICD-10-CM | POA: Diagnosis not present

## 2022-06-06 DIAGNOSIS — K7460 Unspecified cirrhosis of liver: Secondary | ICD-10-CM | POA: Diagnosis not present

## 2022-06-06 DIAGNOSIS — K921 Melena: Secondary | ICD-10-CM

## 2022-06-06 DIAGNOSIS — R7989 Other specified abnormal findings of blood chemistry: Secondary | ICD-10-CM

## 2022-06-06 DIAGNOSIS — Z86010 Personal history of colonic polyps: Secondary | ICD-10-CM

## 2022-06-06 MED ORDER — PEG 3350-KCL-NA BICARB-NACL 420 G PO SOLR: 4000.0000 mL | Freq: Once | ORAL | 0 refills | Status: AC

## 2022-06-06 NOTE — Patient Instructions (Signed)
Colonoscopy and upper endoscopy with Dr. Gala Romney. See separate instructions.  We will be in touch if any of your medications need adjusting once we see your medication list.  You need to see [place] at least every six months so we can manage your cirrhosis.

## 2022-06-06 NOTE — H&P (View-Only) (Signed)
   GI Office Note    Referring Provider: Fusco, Lawrence, MD Primary Care Physician:  Fusco, Lawrence, MD  Primary Gastroenterologist: Michael Rourk, MD   Chief Complaint   Chief Complaint  Patient presents with   Colonoscopy   Hemorrhoids    States internal hemorrhoids are currently bleeding.      History of Present Illness   [name] is a 61 y.o. female presenting today for follow-up at the request of Dr. Fusco for further evaluation of weight loss, abdominal pain, consider colonoscopy.  Patient last seen October 2022. History of cirrhosis of unclear etiology. History of mildly elevated ASMA  in the past but normal in 2020. Numerous other serologies negative. Immune to Hep B. Ceruloplasmin normal. Alpha-1 antitrypsin negative. ANA negative. Hep B surface antigen negative. Hep C antibody negative. AMA negative. . Chronically elevated alk phos. History of fatty liver. Last para in 2018. 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  No H. pylori.  Rectal and colonic polyps removed (hyperplastic) colonic diverticulosis.  Overdue for surveillance colonoscopy.  Last office visit she presented with concern for anasarca.  She was started on Lasix 20 mg and spironolactone 50 mg daily.  Patient has been seen during recent hospitalization by cardiology with CTA negative for aortic dissection and echocardiogram showing vigorous LVEF 60 at 65 to 70% with severe LVH and no focal wall abnormalities. There was consideration of Lexiscan Myoview as an outpatient but patient failed to follow-up for discussion.  Colonoscopy and upper endoscopy planned at time of last visit pending cardiology evaluation.  Abdominal ultrasound May 25, 2022 for right upper quadrant pain showed nodular contour of the liver consistent with cirrhosis.  Probable recanalized umbilical vein.  CT abdomen  pelvis with contrast February 2023: Hepatic cirrhosis again noted.  No hepatic masses.  Recannulization of periumbilical veins again seen, consistent with portal venous hypertension.  Spleen normal size.  Significant decrease in diffuse gastric and bowel wall thickening, with residual bowel wall thickening only involving the duodenum.  This is likely due to decreased portal enteropathy or hypoalbuminemia.  Near complete resolution of ascites and mesenteric edema.  Today: Patient's weight is 201.8, stable from 1 year prior at 200 pounds.  PCP reports 18 pound weight loss over 4 months. Patient states she never went to the heart doctor because she was scared. Now that her BP is normal she no longer has chest pain.   Patient states her weight was over 200 pounds and when she saw PCP last time was 194 pounds. Has had some lower extremity edema, worse the longer she is own her feet. She is no longer on fluid pills. Appetite is not great. Some nausea. No vomiting. Has reflux/burning with the nausea. Tends to eat mostly at night and then makes sick to lay down afterwards. No dysphagia. RUQ pain, hurts when mashes on it. RUQ spasms and sharp pain after meals. BM regular. Has rectal bleeding with each BM. No hard stool or straining. BM daily. No melena. No itching.   No ASA, NSAIDs, blood thinner.      Medications   Current Outpatient Medications  Medication Sig Dispense Refill   ALPRAZolam (XANAX) 1 MG tablet Take 1 mg by mouth 3 (three) times daily as needed for anxiety.     amLODipine (NORVASC) 5 MG tablet Take 1 tablet (5 mg total) by mouth daily. 30 tablet 3     cyclobenzaprine (FLEXERIL) 10 MG tablet Take 10 mg by mouth 3 (three) times daily.     DULoxetine (CYMBALTA) 30 MG capsule Take 30 mg by mouth daily.     DULoxetine (CYMBALTA) 60 MG capsule Take 60 mg by mouth at bedtime.     estradiol (ESTRACE) 1 MG tablet Take 1 mg by mouth daily.     levothyroxine (SYNTHROID, LEVOTHROID) 25 MCG tablet Take  25 mcg by mouth daily.     metoprolol tartrate (LOPRESSOR) 25 MG tablet Take 1 tablet (25 mg total) by mouth 2 (two) times daily. 60 tablet 2   omeprazole (PRILOSEC) 20 MG capsule Take 20 mg by mouth at bedtime.      oxycodone (ROXICODONE) 30 MG immediate release tablet Take 30 mg by mouth 4 (four) times daily as needed.     polyethylene glycol-electrolytes (NULYTELY) 420 g solution Take 4,000 mLs by mouth once for 1 dose. 4000 mL 0   promethazine (PHENERGAN) 50 MG tablet Take 50 mg by mouth 2 (two) times daily as needed.     venlafaxine (EFFEXOR) 75 MG tablet Take 75 mg by mouth daily.     zolpidem (AMBIEN) 10 MG tablet Take 10 mg by mouth at bedtime.       No current facility-administered medications for this visit.    Allergies   Allergies as of 06/06/2022 - Review Complete 06/06/2022  Allergen Reaction Noted   Codeine Hives and Other (See Comments) 03/03/2008   Latex Hives and Itching 02/23/2011   Lyrica [pregabalin] Hives 11/20/2016   Nsaids  11/20/2016   Other Hives and Itching 02/23/2013    Past Medical History   Past Medical History:  Diagnosis Date   Anxiety    Asthma    COPD (chronic obstructive pulmonary disease) (HCC)    Depression    Disc degeneration, lumbar    GERD (gastroesophageal reflux disease)    Hypertension    Leukocytosis    Lumbar disc disease    Shortness of breath    Thrombocytosis     Past Surgical History   Past Surgical History:  Procedure Laterality Date   ABDOMINAL HYSTERECTOMY     BIOPSY  03/25/2014   Procedure: GASTRIC BIOPSIES;  Surgeon: Robert M Rourk, MD;  Location: [facility];  Service: Endoscopy;;   BRAIN SURGERY     COLONOSCOPY  02/21/2007   RMR:Anal papilla and internal hemorrhoids, diminutive rectal polyp/Left-sided diverticula (sigmoid) pedunculated polyps mid descending   COLONOSCOPY  2008   Dr. Rourk: segmental biopsy negative for microscopic colitis. Three polyps, one tubular adenoma. Surveillance due 2013.    COLONOSCOPY  WITH PROPOFOL N/A 03/25/2014   RMR: Rectal and colonic polyps removed as described above.  Colonic diverticulosis. CT abnormality likely artifactual in orgin     ESOPHAGOGASTRODUODENOSCOPY  08/08/2005   NUR:Normal esophagogastroduodenoscopy.   ESOPHAGOGASTRODUODENOSCOPY  2011   Dr. 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 Abnormal gastic mucosa of uncertain significance-status post gasrtric biopsy   MALONEY DILATION N/A 03/25/2014   Procedure: MALONEY ESOPHAGEAL DILATION #54 Fr;  Surgeon: Robert M Rourk, MD;  Location: [facility];  Service: Endoscopy;  Laterality: N/A;   POLYPECTOMY  03/25/2014   Procedure: SIGMOID AND RECTAL POLYPECTOMY;  Surgeon: Robert M Rourk, MD;  Location: [facility];  Service: Endoscopy;;   right knee surgery     rt breast biopsy       TOTAL ABDOMINAL HYSTERECTOMY W/ BILATERAL SALPINGOOPHORECTOMY     age 29    Past Family History   Family History  Adopted: Yes  Problem Relation Age of Onset   Pancreatic cancer Mother    Hepatitis C Brother    Liver cancer Brother        suicide   Pancreatic cancer Maternal Aunt    Kidney cancer Maternal Uncle    Colon cancer Paternal Aunt    Kidney cancer Paternal Grandfather    Colon cancer Brother 52   Leukemia Sister        1/2 sister   Pancreatic cancer Paternal Grandmother    Pancreatic cancer Maternal Uncle     Past Social History   Social History   Socioeconomic History   Marital status: Single    Spouse name: Not on file   Number of children: Not on file   Years of education: Not on file   Highest education level: Not on file  Occupational History   Not on file  Tobacco Use   Smoking status: Every Day    Packs/day: 1.00    Years: 30.00    Total pack years: 30.00    Types: Cigarettes   Smokeless  tobacco: Never  Vaping Use   Vaping Use: Never used  Substance and Sexual Activity   Alcohol use: No    Comment: see HPI. Patient denies alcohol use at present/past.   Drug use: No   Sexual activity: Not Currently  Other Topics Concern   Not on file  Social History Narrative   Not on file   Social Determinants of Health   Financial Resource Strain: Not on file  Food Insecurity: Not on file  Transportation Needs: Not on file  Physical Activity: Not on file  Stress: Not on file  Social Connections: Not on file  Intimate Partner Violence: Not on file    Review of Systems   General: Negative for  fever, chills, fatigue, weakness. See hpi Eyes: Negative for vision changes.  ENT: Negative for hoarseness, difficulty swallowing , nasal congestion. CV: Negative for chest pain, angina, palpitations, dyspnea on exertion, +peripheral edema.  Respiratory: Negative for dyspnea at rest, dyspnea on exertion, cough, sputum, wheezing.  GI: See history of present illness. GU:  Negative for dysuria, hematuria, urinary incontinence, urinary frequency, nocturnal urination.  MS: Negative for joint pain, low back pain.  Derm: Negative for rash or itching.  Neuro: Negative for weakness, abnormal sensation, seizure, frequent headaches, memory loss,  confusion.  Psych: Negative for anxiety, depression, suicidal ideation, hallucinations.  Endo: see hpi Heme: Negative for bruising or bleeding. Allergy: Negative for rash or hives.  Physical Exam   BP 102/66 (BP Location: Right Arm, Patient Position: Sitting, Cuff Size: Large)   Pulse 88   Temp 98 F (36.7 C) (Oral)   Ht 5' 6" (1.676 m)   Wt 201 lb 12.8 oz (91.5 kg)   SpO2 98%   BMI 32.57 kg/m    General: Well-nourished, well-developed in no acute distress.  Head: Normocephalic, atraumatic.   Eyes: Conjunctiva pink, no icterus. Mouth: Oropharyngeal mucosa moist and pink , no lesions erythema or exudate. Neck: Supple without thyromegaly,  masses, or lymphadenopathy.  Lungs: Clear to auscultation bilaterally.  Heart: Regular rate and rhythm, no murmurs rubs or gallops.  Abdomen: Bowel sounds are normal,  nondistended, no hepatosplenomegaly or masses,  no abdominal bruits or hernia, no rebound or guarding.  Mild Ruq/epig tenderness. Rectal: deferred Extremities: 1+ to mid lower   extremity edema. No clubbing or deformities.  Neuro: Alert and oriented x 4 , grossly normal neurologically.  Skin: Warm and dry, no rash or jaundice.   Psych: Alert and cooperative, normal mood and affect.  Labs  Labs from 05/04/2022: TIME OF COLLECTION UNKNOWN.  White blood cell count 10,200, hemoglobin 10.7 down from 11.7 in May 2023, hematocrit 32, MCV 89, platelets 258,000, glucose 114, creatinine 1.52, sodium 141, albumin 3.2, total bilirubin 0.3, alkaline phosphatase 270 down from 330 for May 2023, AST 40, ALT 25, B12 1530, folate 3.1 cortisol level 1.4 TSH 0.927, vitamin D 37.6  Imaging Studies   US Abdomen Complete  Result Date: 05/25/2022 CLINICAL DATA:  Right upper quadrant abdomen pain. EXAM: ABDOMEN ULTRASOUND COMPLETE COMPARISON:  March 09, 2021 FINDINGS: Gallbladder: No gallstones or wall thickening visualized. No sonographic Murphy sign noted by sonographer. Common bile duct: Diameter: 7.7 mm. Liver: Nodular contour of the liver is identified. The echotexture of the liver is normal. There is probable recanalized umbilical vein. No focal lesion is identified. Portal vein is patent on color Doppler imaging with normal direction of blood flow towards the liver. IVC: No abnormality visualized. Pancreas: Visualized portion unremarkable. Spleen: Size and appearance within normal limits. Right Kidney: Length: 11.3 cm. Echogenicity within normal limits. No mass or hydronephrosis visualized. Left Kidney: Length: 10.8 cm. Echogenicity within normal limits. No mass or hydronephrosis visualized. Abdominal aorta: No aneurysm visualized. Other findings:  None. IMPRESSION: 1. No acute abnormality identified. 2. Nodular contour of the liver, consistent with cirrhosis. There is probable recanalized umbilical vein. Electronically Signed   By: Wei-Chen  Lin M.D.   On: 05/25/2022 12:00    Assessment   GERD: complains of refractory symptoms and associated nausea. Recent cortisol level low, unclear time of specimen collection. Will plan for repeat fasting am cortisol level.   RUQ pain: associated with nausea, reflux symptoms. EGD planned to evaluate for gastritis/duodenitis, ulcers.   Cirrhosis: mild lower extremity edema, worse with prolonged standing. No longer on diuretics. No notable ascites. Recent u/s without evidence of hepatoma. Query NASH vs other. Screen for varices at time of EGD.  Abnormal lfts: chronically elevated alk phos over 10 years, GGT elevated in 2022. negative AMA in 2018. Previous weakly ASMA positive in 2018 but subsequently negative in 2020. Alkphos remains significantly elevated as outlined. Update labs.   Rectal bleeding: noted with each BM. Denies straining or constipation. Differential includes benign anorectal source, malignancy.  H/O colon polyps: due for surveillance colonoscopy 2020.  Normocytic anemia: recent decline in Hgb as outlined. EGD/colonoscopy as planned.   PLAN   Colonoscopy/EGD in near future. ASA 3.  I have discussed the risks, alternatives, benefits with regards to but not limited to the risk of reaction to medication, bleeding, infection, perforation and the patient is agreeable to proceed. Written consent to be obtained. Update labs to evaluate chronically elevated alkphos and calculate meld.  Fasting AM cortisol level.   Areyana Leoni S. Estephany Perot, MHS, PA-C [facility]  

## 2022-06-06 NOTE — Progress Notes (Signed)
GI Office Note    Referring Provider: Redmond School, MD Primary Care Physician:  Redmond School, MD  Primary Gastroenterologist: Garfield Cornea, MD   Chief Complaint   Chief Complaint  Patient presents with   Colonoscopy   Hemorrhoids    States internal hemorrhoids are currently bleeding.      History of Present Illness   [name] is a 62 y.o. female presenting today for follow-up at the request of Dr. Gerarda Fraction for further evaluation of weight loss, abdominal pain, consider colonoscopy.  Patient last seen October 2022. History of cirrhosis of unclear etiology. History of mildly elevated ASMA  in the past but normal in 2020. Numerous other serologies negative. Immune to Hep B. Ceruloplasmin normal. Alpha-1 antitrypsin negative. ANA negative. Hep B surface antigen negative. Hep C antibody negative. AMA negative. . Chronically elevated alk phos. History of fatty liver. Last para in 2018. 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  No H. pylori.  Rectal and colonic polyps removed (hyperplastic) colonic diverticulosis.  Overdue for surveillance colonoscopy.  Last office visit she presented with concern for anasarca.  She was started on Lasix 20 mg and spironolactone 50 mg daily.  Patient has been seen during recent hospitalization by cardiology with CTA negative for aortic dissection and echocardiogram showing vigorous LVEF 60 at 65 to 70% with severe LVH and no focal wall abnormalities. There was consideration of Lexiscan Myoview as an outpatient but patient failed to follow-up for discussion.  Colonoscopy and upper endoscopy planned at time of last visit pending cardiology evaluation.  Abdominal ultrasound May 25, 2022 for right upper quadrant pain showed nodular contour of the liver consistent with cirrhosis.  Probable recanalized umbilical vein.  CT abdomen  pelvis with contrast February 2023: Hepatic cirrhosis again noted.  No hepatic masses.  Recannulization of periumbilical veins again seen, consistent with portal venous hypertension.  Spleen normal size.  Significant decrease in diffuse gastric and bowel wall thickening, with residual bowel wall thickening only involving the duodenum.  This is likely due to decreased portal enteropathy or hypoalbuminemia.  Near complete resolution of ascites and mesenteric edema.  Today: Patient's weight is 201.8, stable from 1 year prior at 200 pounds.  PCP reports 18 pound weight loss over 4 months. Patient states she never went to the heart doctor because she was scared. Now that her BP is normal she no longer has chest pain.   Patient states her weight was over 200 pounds and when she saw PCP last time was 194 pounds. Has had some lower extremity edema, worse the longer she is own her feet. She is no longer on fluid pills. Appetite is not great. Some nausea. No vomiting. Has reflux/burning with the nausea. Tends to eat mostly at night and then makes sick to lay down afterwards. No dysphagia. RUQ pain, hurts when mashes on it. RUQ spasms and sharp pain after meals. BM regular. Has rectal bleeding with each BM. No hard stool or straining. BM daily. No melena. No itching.   No ASA, NSAIDs, blood thinner.      Medications   Current Outpatient Medications  Medication Sig Dispense Refill   ALPRAZolam (XANAX) 1 MG tablet Take 1 mg by mouth 3 (three) times daily as needed for anxiety.     amLODipine (NORVASC) 5 MG tablet Take 1 tablet (5 mg total) by mouth daily. 30 tablet 3  cyclobenzaprine (FLEXERIL) 10 MG tablet Take 10 mg by mouth 3 (three) times daily.     DULoxetine (CYMBALTA) 30 MG capsule Take 30 mg by mouth daily.     DULoxetine (CYMBALTA) 60 MG capsule Take 60 mg by mouth at bedtime.     estradiol (ESTRACE) 1 MG tablet Take 1 mg by mouth daily.     levothyroxine (SYNTHROID, LEVOTHROID) 25 MCG tablet Take  25 mcg by mouth daily.     metoprolol tartrate (LOPRESSOR) 25 MG tablet Take 1 tablet (25 mg total) by mouth 2 (two) times daily. 60 tablet 2   omeprazole (PRILOSEC) 20 MG capsule Take 20 mg by mouth at bedtime.      oxycodone (ROXICODONE) 30 MG immediate release tablet Take 30 mg by mouth 4 (four) times daily as needed.     polyethylene glycol-electrolytes (NULYTELY) 420 g solution Take 4,000 mLs by mouth once for 1 dose. 4000 mL 0   promethazine (PHENERGAN) 50 MG tablet Take 50 mg by mouth 2 (two) times daily as needed.     venlafaxine (EFFEXOR) 75 MG tablet Take 75 mg by mouth daily.     zolpidem (AMBIEN) 10 MG tablet Take 10 mg by mouth at bedtime.       No current facility-administered medications for this visit.    Allergies   Allergies as of 06/06/2022 - Review Complete 06/06/2022  Allergen Reaction Noted   Codeine Hives and Other (See Comments) 03/03/2008   Latex Hives and Itching 02/23/2011   Lyrica [pregabalin] Hives 11/20/2016   Nsaids  11/20/2016   Other Hives and Itching 02/23/2013    Past Medical History   Past Medical History:  Diagnosis Date   Anxiety    Asthma    COPD (chronic obstructive pulmonary disease) (HCC)    Depression    Disc degeneration, lumbar    GERD (gastroesophageal reflux disease)    Hypertension    Leukocytosis    Lumbar disc disease    Shortness of breath    Thrombocytosis     Past Surgical History   Past Surgical History:  Procedure Laterality Date   ABDOMINAL HYSTERECTOMY     BIOPSY  03/25/2014   Procedure: GASTRIC BIOPSIES;  Surgeon: Daneil Dolin, MD;  Location: [facility];  Service: Endoscopy;;   BRAIN SURGERY     COLONOSCOPY  02/21/2007   KGU:RKYH papilla and internal hemorrhoids, diminutive rectal polyp/Left-sided diverticula (sigmoid) pedunculated polyps mid descending   COLONOSCOPY  2008   Dr. Gala Romney: segmental biopsy negative for microscopic colitis. Three polyps, one tubular adenoma. Surveillance due 2013.    COLONOSCOPY  WITH PROPOFOL N/A 03/25/2014   RMR: Rectal and colonic polyps removed as described above.  Colonic diverticulosis. CT abnormality likely artifactual in orgin     ESOPHAGOGASTRODUODENOSCOPY  08/08/2005   CWC:BJSEGB esophagogastroduodenoscopy.   ESOPHAGOGASTRODUODENOSCOPY  2011   Dr. Gala Romney: normal esophagus, small hiatal hernia, duodenal diverticulum, negative H.pylori and celiac   ESOPHAGOGASTRODUODENOSCOPY (EGD) WITH PROPOFOL N/A 03/25/2014   RMR: Probable occult cervical esophageal web- status post dilation as described above. Abnormal gastic mucosa of uncertain significance-status post gasrtric biopsy   MALONEY DILATION N/A 03/25/2014   Procedure: MALONEY ESOPHAGEAL DILATION #54 Fr;  Surgeon: Daneil Dolin, MD;  Location: [facility];  Service: Endoscopy;  Laterality: N/A;   POLYPECTOMY  03/25/2014   Procedure: SIGMOID AND RECTAL POLYPECTOMY;  Surgeon: Daneil Dolin, MD;  Location: [facility];  Service: Endoscopy;;   right knee surgery     rt breast biopsy  TOTAL ABDOMINAL HYSTERECTOMY W/ BILATERAL SALPINGOOPHORECTOMY     age 53    Past Family History   Family History  Adopted: Yes  Problem Relation Age of Onset   Pancreatic cancer Mother    Hepatitis C Brother    Liver cancer Brother        suicide   Pancreatic cancer Maternal Aunt    Kidney cancer Maternal Uncle    Colon cancer Paternal Aunt    Kidney cancer Paternal Grandfather    Colon cancer Brother 8   Leukemia Sister        1/2 sister   Pancreatic cancer Paternal Grandmother    Pancreatic cancer Maternal Uncle     Past Social History   Social History   Socioeconomic History   Marital status: Single    Spouse name: Not on file   Number of children: Not on file   Years of education: Not on file   Highest education level: Not on file  Occupational History   Not on file  Tobacco Use   Smoking status: Every Day    Packs/day: 1.00    Years: 30.00    Total pack years: 30.00    Types: Cigarettes   Smokeless  tobacco: Never  Vaping Use   Vaping Use: Never used  Substance and Sexual Activity   Alcohol use: No    Comment: see HPI. Patient denies alcohol use at present/past.   Drug use: No   Sexual activity: Not Currently  Other Topics Concern   Not on file  Social History Narrative   Not on file   Social Determinants of Health   Financial Resource Strain: Not on file  Food Insecurity: Not on file  Transportation Needs: Not on file  Physical Activity: Not on file  Stress: Not on file  Social Connections: Not on file  Intimate Partner Violence: Not on file    Review of Systems   General: Negative for  fever, chills, fatigue, weakness. See hpi Eyes: Negative for vision changes.  ENT: Negative for hoarseness, difficulty swallowing , nasal congestion. CV: Negative for chest pain, angina, palpitations, dyspnea on exertion, +peripheral edema.  Respiratory: Negative for dyspnea at rest, dyspnea on exertion, cough, sputum, wheezing.  GI: See history of present illness. GU:  Negative for dysuria, hematuria, urinary incontinence, urinary frequency, nocturnal urination.  MS: Negative for joint pain, low back pain.  Derm: Negative for rash or itching.  Neuro: Negative for weakness, abnormal sensation, seizure, frequent headaches, memory loss,  confusion.  Psych: Negative for anxiety, depression, suicidal ideation, hallucinations.  Endo: see hpi Heme: Negative for bruising or bleeding. Allergy: Negative for rash or hives.  Physical Exam   BP 102/66 (BP Location: Right Arm, Patient Position: Sitting, Cuff Size: Large)   Pulse 88   Temp 98 F (36.7 C) (Oral)   Ht 5' 6" (1.676 m)   Wt 201 lb 12.8 oz (91.5 kg)   SpO2 98%   BMI 32.57 kg/m    General: Well-nourished, well-developed in no acute distress.  Head: Normocephalic, atraumatic.   Eyes: Conjunctiva pink, no icterus. Mouth: Oropharyngeal mucosa moist and pink , no lesions erythema or exudate. Neck: Supple without thyromegaly,  masses, or lymphadenopathy.  Lungs: Clear to auscultation bilaterally.  Heart: Regular rate and rhythm, no murmurs rubs or gallops.  Abdomen: Bowel sounds are normal,  nondistended, no hepatosplenomegaly or masses,  no abdominal bruits or hernia, no rebound or guarding.  Mild Ruq/epig tenderness. Rectal: deferred Extremities: 1+ to mid lower  extremity edema. No clubbing or deformities.  Neuro: Alert and oriented x 4 , grossly normal neurologically.  Skin: Warm and dry, no rash or jaundice.   Psych: Alert and cooperative, normal mood and affect.  Labs  Labs from 05/04/2022: TIME OF COLLECTION UNKNOWN.  White blood cell count 10,200, hemoglobin 10.7 down from 11.7 in May 2023, hematocrit 32, MCV 89, platelets 258,000, glucose 114, creatinine 1.52, sodium 141, albumin 3.2, total bilirubin 0.3, alkaline phosphatase 270 down from 330 for May 2023, AST 40, ALT 25, B12 1530, folate 3.1 cortisol level 1.4 TSH 0.927, vitamin D 37.6  Imaging Studies   US Abdomen Complete  Result Date: 05/25/2022 CLINICAL DATA:  Right upper quadrant abdomen pain. EXAM: ABDOMEN ULTRASOUND COMPLETE COMPARISON:  March 09, 2021 FINDINGS: Gallbladder: No gallstones or wall thickening visualized. No sonographic Murphy sign noted by sonographer. Common bile duct: Diameter: 7.7 mm. Liver: Nodular contour of the liver is identified. The echotexture of the liver is normal. There is probable recanalized umbilical vein. No focal lesion is identified. Portal vein is patent on color Doppler imaging with normal direction of blood flow towards the liver. IVC: No abnormality visualized. Pancreas: Visualized portion unremarkable. Spleen: Size and appearance within normal limits. Right Kidney: Length: 11.3 cm. Echogenicity within normal limits. No mass or hydronephrosis visualized. Left Kidney: Length: 10.8 cm. Echogenicity within normal limits. No mass or hydronephrosis visualized. Abdominal aorta: No aneurysm visualized. Other findings:  None. IMPRESSION: 1. No acute abnormality identified. 2. Nodular contour of the liver, consistent with cirrhosis. There is probable recanalized umbilical vein. Electronically Signed   By: Abelardo Diesel M.D.   On: 05/25/2022 12:00    Assessment   GERD: complains of refractory symptoms and associated nausea. Recent cortisol level low, unclear time of specimen collection. Will plan for repeat fasting am cortisol level.   RUQ pain: associated with nausea, reflux symptoms. EGD planned to evaluate for gastritis/duodenitis, ulcers.   Cirrhosis: mild lower extremity edema, worse with prolonged standing. No longer on diuretics. No notable ascites. Recent u/s without evidence of hepatoma. Query NASH vs other. Screen for varices at time of EGD.  Abnormal lfts: chronically elevated alk phos over 10 years, GGT elevated in 2022. negative AMA in 2018. Previous weakly ASMA positive in 2018 but subsequently negative in 2020. Alkphos remains significantly elevated as outlined. Update labs.   Rectal bleeding: noted with each BM. Denies straining or constipation. Differential includes benign anorectal source, malignancy.  H/O colon polyps: due for surveillance colonoscopy 2020.  Normocytic anemia: recent decline in Hgb as outlined. EGD/colonoscopy as planned.   PLAN   Colonoscopy/EGD in near future. ASA 3.  I have discussed the risks, alternatives, benefits with regards to but not limited to the risk of reaction to medication, bleeding, infection, perforation and the patient is agreeable to proceed. Written consent to be obtained. Update labs to evaluate chronically elevated alkphos and calculate meld.  Fasting AM cortisol level.   Laureen Ochs. Bobby Rumpf, [place], [facility]

## 2022-06-06 NOTE — Telephone Encounter (Signed)
Pt's medication list has been updated per pharmacy records.

## 2022-06-06 NOTE — Telephone Encounter (Signed)
Noted  

## 2022-06-08 ENCOUNTER — Other Ambulatory Visit (HOSPITAL_COMMUNITY)
Admission: RE | Admit: 2022-06-08 | Discharge: 2022-06-08 | Disposition: A | Discharge status: Home / Self Care | Payer: Medicare HMO | Source: Ambulatory Visit | Attending: Gastroenterology | Admitting: Gastroenterology

## 2022-06-08 DIAGNOSIS — K7460 Unspecified cirrhosis of liver: Secondary | ICD-10-CM | POA: Insufficient documentation

## 2022-06-08 LAB — COMPREHENSIVE METABOLIC PANEL
ALT: 32 U/L (ref 0–44)
AST: 61 U/L — ABNORMAL HIGH (ref 15–41)
Albumin: 3.2 g/dL — ABNORMAL LOW (ref 3.5–5.0)
Alkaline Phosphatase: 309 U/L — ABNORMAL HIGH (ref 38–126)
Anion gap: 5 (ref 5–15)
BUN: 21 mg/dL — ABNORMAL HIGH (ref 6–20)
CO2: 27 mmol/L (ref 22–32)
Calcium: 9 mg/dL (ref 8.9–10.3)
Chloride: 106 mmol/L (ref 98–111)
Creatinine, Ser: 1.06 mg/dL — ABNORMAL HIGH (ref 0.44–1.00)
GFR, Estimated: >60 mL/min (ref >60)
Glucose, Bld: 86 mg/dL (ref 70–99)
Potassium: 3.9 mmol/L (ref 3.5–5.1)
Sodium: 138 mmol/L (ref 135–145)
Total Bilirubin: 0.6 mg/dL (ref 0.3–1.2)
Total Protein: 6.5 g/dL (ref 6.5–8.1)

## 2022-06-08 LAB — CORTISOL-AM, BLOOD: Cortisol - AM: 2.4 ug/dL — ABNORMAL LOW (ref 6.7–22.6)

## 2022-06-08 LAB — PROTIME-INR
INR: 1.1 (ref 0.8–1.2)
Prothrombin Time: 14 seconds (ref 11.4–15.2)

## 2022-06-13 LAB — AFP TUMOR MARKER: AFP, Serum, Tumor Marker: 3.9 ng/mL (ref 0.0–9.2)

## 2022-06-15 LAB — MISC LABCORP TEST (SEND OUT): Labcorp test code: 520192

## 2022-06-28 NOTE — Patient Instructions (Signed)
[name]  06/28/2022     '@PREFPERIOPPHARMACY'$ @   Your procedure is scheduled on  07/04/2022.   Report to [facility] at  0830  A.M.   Call this number if you have problems the morning of surgery:  [phone]  If you experience any cold or flu symptoms such as cough, fever, chills, shortness of breath, etc. between now and your scheduled surgery, please notify us at the above number.   Remember:  Follow the diet and prep instructions given to you by the office.     Take these medicines the morning of surgery with A SIP OF WATER             xanax(if needed), amlodipine, flexeril(if needed),cymbalta, levothyroxine, metoprolol, omeprazole, oxycodone(if needed), effexor.     Do not wear jewelry, make-up or nail polish.  Do not wear lotions, powders, or perfumes, or deodorant.  Do not shave 48 hours prior to surgery.  Men may shave face and neck.  Do not bring valuables to the hospital.  [facility] is not responsible for any belongings or valuables.  Contacts, dentures or bridgework may not be worn into surgery.  Leave your suitcase in the car.  After surgery it may be brought to your room.  For patients admitted to the hospital, discharge time will be determined by your treatment team.  Patients discharged the day of surgery will not be allowed to drive home and must have someone with them for 24 hours.    Special instructions:   DO NOT smoke tobacco or vape for 24 hours before your procedure.  Please read over the following fact sheets that you were given. Anesthesia Post-op Instructions and Care and Recovery After Surgery      Upper Endoscopy, Adult, Care After After the procedure, it is common to have a sore throat. It is also common to have: Mild stomach pain or discomfort. Bloating. Nausea. Follow these instructions at home: The instructions below may help you care for yourself at home. Your health care provider may give you more instructions.  If you have questions, ask your health care provider. If you were given a sedative during the procedure, it can affect you for several hours. Do not drive or operate machinery until your health care provider says that it is safe. If you will be going home right after the procedure, plan to have a responsible adult: Take you home from the hospital or clinic. You will not be allowed to drive. Care for you for the time you are told. Follow instructions from your health care provider about what you may eat and drink. Return to your normal activities as told by your health care provider. Ask your health care provider what activities are safe for you. Take over-the-counter and prescription medicines only as told by your health care provider. Contact a health care provider if you: Have a sore throat that lasts longer than one day. Have trouble swallowing. Have a fever. Get help right away if you: Vomit blood or your vomit looks like coffee grounds. Have bloody, black, or tarry stools. Have a very bad sore throat or you cannot swallow. Have difficulty breathing or very bad pain in your chest or abdomen. These symptoms may be an emergency. Get help right away. Call 911. Do not wait to see if the symptoms will go away. Do not drive yourself to the hospital. Summary After the procedure, it is common to have a  sore throat, mild stomach discomfort, bloating, and nausea. If you were given a sedative during the procedure, it can affect you for several hours. Do not drive until your health care provider says that it is safe. Follow instructions from your health care provider about what you may eat and drink. Return to your normal activities as told by your health care provider. This information is not intended to replace advice given to you by your health care provider. Make sure you discuss any questions you have with your health care provider. Document Revised: 08/30/2021 Document Reviewed:  08/30/2021 Elsevier Patient Education  [place]. Colonoscopy, Adult, Care After The following information offers guidance on how to care for yourself after your procedure. Your health care provider may also give you more specific instructions. If you have problems or questions, contact your health care provider. What can I expect after the procedure? After the procedure, it is common to have: A small amount of blood in your stool for 24 hours after the procedure. Some gas. Mild cramping or bloating of your abdomen. Follow these instructions at home: Eating and drinking  Drink enough fluid to keep your urine pale yellow. Follow instructions from your health care provider about eating or drinking restrictions. Resume your normal diet as told by your health care provider. Avoid heavy or fried foods that are hard to digest. Activity Rest as told by your health care provider. Avoid sitting for a long time without moving. Get up to take short walks every 1-2 hours. This is important to improve blood flow and breathing. Ask for help if you feel weak or unsteady. Return to your normal activities as told by your health care provider. Ask your health care provider what activities are safe for you. Managing cramping and bloating  Try walking around when you have cramps or feel bloated. If directed, apply heat to your abdomen as told by your health care provider. Use the heat source that your health care provider recommends, such as a moist heat pack or a heating pad. Place a towel between your skin and the heat source. Leave the heat on for 20-30 minutes. Remove the heat if your skin turns bright red. This is especially important if you are unable to feel pain, heat, or cold. You have a greater risk of getting burned. General instructions If you were given a sedative during the procedure, it can affect you for several hours. Do not drive or operate machinery until your health care provider  says that it is safe. For the first 24 hours after the procedure: Do not sign important documents. Do not drink alcohol. Do your regular daily activities at a slower pace than normal. Eat soft foods that are easy to digest. Take over-the-counter and prescription medicines only as told by your health care provider. Keep all follow-up visits. This is important. Contact a health care provider if: You have blood in your stool 2-3 days after the procedure. Get help right away if: You have more than a small spotting of blood in your stool. You have large blood clots in your stool. You have swelling of your abdomen. You have nausea or vomiting. You have a fever. You have increasing pain in your abdomen that is not relieved with medicine. These symptoms may be an emergency. Get help right away. Call 911. Do not wait to see if the symptoms will go away. Do not drive yourself to the hospital. Summary After the procedure, it is common to have a  small amount of blood in your stool. You may also have mild cramping and bloating of your abdomen. If you were given a sedative during the procedure, it can affect you for several hours. Do not drive or operate machinery until your health care provider says that it is safe. Get help right away if you have a lot of blood in your stool, nausea or vomiting, a fever, or increased pain in your abdomen. This information is not intended to replace advice given to you by your health care provider. Make sure you discuss any questions you have with your health care provider. Document Revised: 01/11/2021 Document Reviewed: 01/11/2021 Elsevier Patient Education  [place] After The following information offers guidance on how to care for yourself after your procedure. Your health care provider may also give you more specific instructions. If you have problems or questions, contact your health care provider. What can I expect  after the procedure? After the procedure, it is common to have: Tiredness. Little or no memory about what happened during or after the procedure. Impaired judgment when it comes to making decisions. Nausea or vomiting. Some trouble with balance. Follow these instructions at home: For the time period you were told by your health care provider:  Rest. Do not participate in activities where you could fall or become injured. Do not drive or use machinery. Do not drink alcohol. Do not take sleeping pills or medicines that cause drowsiness. Do not make important decisions or sign legal documents. Do not take care of children on your own. Medicines Take over-the-counter and prescription medicines only as told by your health care provider. If you were prescribed antibiotics, take them as told by your health care provider. Do not stop using the antibiotic even if you start to feel better. Eating and drinking Follow instructions from your health care provider about what you may eat and drink. Drink enough fluid to keep your urine pale yellow. If you vomit: Drink clear fluids slowly and in small amounts as you are able. Clear fluids include water, ice chips, low-calorie sports drinks, and fruit juice that has water added to it (diluted fruit juice). Eat light and bland foods in small amounts as you are able. These foods include bananas, applesauce, rice, lean meats, toast, and crackers. General instructions  Have a responsible adult stay with you for the time you are told. It is important to have someone help care for you until you are awake and alert. If you have sleep apnea, surgery and some medicines can increase your risk for breathing problems. Follow instructions from your health care provider about wearing your sleep device: When you are sleeping. This includes during daytime naps. While taking prescription pain medicines, sleeping medicines, or medicines that make you drowsy. Do not use  any products that contain nicotine or tobacco. These products include cigarettes, chewing tobacco, and vaping devices, such as e-cigarettes. If you need help quitting, ask your health care provider. Contact a health care provider if: You feel nauseous or vomit every time you eat or drink. You feel light-headed. You are still sleepy or having trouble with balance after 24 hours. You get a rash. You have a fever. You have redness or swelling around the IV site. Get help right away if: You have trouble breathing. You have new confusion after you get home. These symptoms may be an emergency. Get help right away. Call 911. Do not wait to see if the symptoms will go  away. Do not drive yourself to the hospital. This information is not intended to replace advice given to you by your health care provider. Make sure you discuss any questions you have with your health care provider. Document Revised: 10/16/2021 Document Reviewed: 10/16/2021 Elsevier Patient Education  [place].

## 2022-06-29 DIAGNOSIS — E6609 Other obesity due to excess calories: Secondary | ICD-10-CM | POA: Diagnosis not present

## 2022-06-29 DIAGNOSIS — Z6835 Body mass index (BMI) 35.0-35.9, adult: Secondary | ICD-10-CM | POA: Diagnosis not present

## 2022-06-29 DIAGNOSIS — D45 Polycythemia vera: Secondary | ICD-10-CM | POA: Diagnosis not present

## 2022-06-29 DIAGNOSIS — R69 Illness, unspecified: Secondary | ICD-10-CM | POA: Diagnosis not present

## 2022-06-29 DIAGNOSIS — I10 Essential (primary) hypertension: Secondary | ICD-10-CM | POA: Diagnosis not present

## 2022-06-29 DIAGNOSIS — D473 Essential (hemorrhagic) thrombocythemia: Secondary | ICD-10-CM | POA: Diagnosis not present

## 2022-06-29 DIAGNOSIS — K7469 Other cirrhosis of liver: Secondary | ICD-10-CM | POA: Diagnosis not present

## 2022-06-29 DIAGNOSIS — E2749 Other adrenocortical insufficiency: Secondary | ICD-10-CM | POA: Diagnosis not present

## 2022-06-29 DIAGNOSIS — I700 Atherosclerosis of aorta: Secondary | ICD-10-CM | POA: Diagnosis not present

## 2022-06-29 DIAGNOSIS — M5136 Other intervertebral disc degeneration, lumbar region: Secondary | ICD-10-CM | POA: Diagnosis not present

## 2022-06-29 DIAGNOSIS — E538 Deficiency of other specified B group vitamins: Secondary | ICD-10-CM | POA: Diagnosis not present

## 2022-07-02 ENCOUNTER — Encounter (HOSPITAL_COMMUNITY)
Admission: RE | Admit: 2022-07-02 | Discharge: 2022-07-02 | Disposition: A | Discharge status: Home / Self Care | Payer: Medicare HMO | Source: Ambulatory Visit | Attending: Internal Medicine | Admitting: Internal Medicine

## 2022-07-02 ENCOUNTER — Encounter (HOSPITAL_COMMUNITY): Payer: Self-pay

## 2022-07-02 ENCOUNTER — Other Ambulatory Visit (INDEPENDENT_AMBULATORY_CARE_PROVIDER_SITE_OTHER): Payer: Self-pay | Admitting: *Deleted

## 2022-07-02 VITALS — BP 95/50 | HR 73 | Temp 97.6°F | Resp 18 | Ht 66.0 in | Wt 201.7 lb

## 2022-07-02 DIAGNOSIS — I10 Essential (primary) hypertension: Secondary | ICD-10-CM | POA: Diagnosis not present

## 2022-07-02 DIAGNOSIS — Z01818 Encounter for other preprocedural examination: Secondary | ICD-10-CM | POA: Diagnosis not present

## 2022-07-02 DIAGNOSIS — D500 Iron deficiency anemia secondary to blood loss (chronic): Secondary | ICD-10-CM

## 2022-07-02 DIAGNOSIS — R899 Unspecified abnormal finding in specimens from other organs, systems and tissues: Secondary | ICD-10-CM

## 2022-07-02 HISTORY — DX: Anemia, unspecified: D64.9

## 2022-07-02 LAB — CBC WITH DIFFERENTIAL/PLATELET
Abs Immature Granulocytes: 0.02 10*3/uL (ref 0.00–0.07)
Basophils Absolute: 0.1 10*3/uL (ref 0.0–0.1)
Basophils Relative: 1 %
Eosinophils Absolute: 0.4 10*3/uL (ref 0.0–0.5)
Eosinophils Relative: 6 %
HCT: 31.5 % — ABNORMAL LOW (ref 36.0–46.0)
Hemoglobin: 10 g/dL — ABNORMAL LOW (ref 12.0–15.0)
Immature Granulocytes: 0 %
Lymphocytes Relative: 35 %
Lymphs Abs: 2.4 10*3/uL (ref 0.7–4.0)
MCH: 29.6 pg (ref 26.0–34.0)
MCHC: 31.7 g/dL (ref 30.0–36.0)
MCV: 93.2 fL (ref 80.0–100.0)
Monocytes Absolute: 0.8 10*3/uL (ref 0.1–1.0)
Monocytes Relative: 11 %
Neutro Abs: 3.2 10*3/uL (ref 1.7–7.7)
Neutrophils Relative %: 47 %
Platelets: 250 10*3/uL (ref 150–400)
RBC: 3.38 MIL/uL — ABNORMAL LOW (ref 3.87–5.11)
RDW: 13.6 % (ref 11.5–15.5)
WBC: 6.9 10*3/uL (ref 4.0–10.5)
nRBC: 0 % (ref 0.0–0.2)

## 2022-07-04 ENCOUNTER — Ambulatory Visit (HOSPITAL_COMMUNITY)
Admission: RE | Admit: 2022-07-04 | Discharge: 2022-07-04 | Disposition: A | Discharge status: Home / Self Care | Payer: Medicare HMO | Attending: Internal Medicine | Admitting: Internal Medicine

## 2022-07-04 ENCOUNTER — Encounter (HOSPITAL_COMMUNITY): Payer: Self-pay | Admitting: Internal Medicine

## 2022-07-04 ENCOUNTER — Ambulatory Visit (HOSPITAL_BASED_OUTPATIENT_CLINIC_OR_DEPARTMENT_OTHER): Payer: Medicare HMO | Admitting: Anesthesiology

## 2022-07-04 ENCOUNTER — Encounter (HOSPITAL_COMMUNITY)
Admission: RE | Disposition: A | Discharge status: Home / Self Care | Payer: Self-pay | Source: Home / Self Care | Attending: Internal Medicine

## 2022-07-04 ENCOUNTER — Ambulatory Visit (HOSPITAL_COMMUNITY): Payer: Medicare HMO | Admitting: Anesthesiology

## 2022-07-04 DIAGNOSIS — R1011 Right upper quadrant pain: Secondary | ICD-10-CM | POA: Diagnosis not present

## 2022-07-04 DIAGNOSIS — R188 Other ascites: Secondary | ICD-10-CM | POA: Diagnosis not present

## 2022-07-04 DIAGNOSIS — K766 Portal hypertension: Secondary | ICD-10-CM

## 2022-07-04 DIAGNOSIS — K921 Melena: Secondary | ICD-10-CM | POA: Diagnosis not present

## 2022-07-04 DIAGNOSIS — F419 Anxiety disorder, unspecified: Secondary | ICD-10-CM | POA: Insufficient documentation

## 2022-07-04 DIAGNOSIS — I252 Old myocardial infarction: Secondary | ICD-10-CM | POA: Insufficient documentation

## 2022-07-04 DIAGNOSIS — R0602 Shortness of breath: Secondary | ICD-10-CM | POA: Insufficient documentation

## 2022-07-04 DIAGNOSIS — J449 Chronic obstructive pulmonary disease, unspecified: Secondary | ICD-10-CM | POA: Diagnosis not present

## 2022-07-04 DIAGNOSIS — I10 Essential (primary) hypertension: Secondary | ICD-10-CM | POA: Diagnosis not present

## 2022-07-04 DIAGNOSIS — Z6832 Body mass index (BMI) 32.0-32.9, adult: Secondary | ICD-10-CM | POA: Insufficient documentation

## 2022-07-04 DIAGNOSIS — Z538 Procedure and treatment not carried out for other reasons: Secondary | ICD-10-CM | POA: Diagnosis not present

## 2022-07-04 DIAGNOSIS — R634 Abnormal weight loss: Secondary | ICD-10-CM | POA: Insufficient documentation

## 2022-07-04 DIAGNOSIS — F17210 Nicotine dependence, cigarettes, uncomplicated: Secondary | ICD-10-CM | POA: Insufficient documentation

## 2022-07-04 DIAGNOSIS — K219 Gastro-esophageal reflux disease without esophagitis: Secondary | ICD-10-CM | POA: Diagnosis not present

## 2022-07-04 DIAGNOSIS — K649 Unspecified hemorrhoids: Secondary | ICD-10-CM | POA: Insufficient documentation

## 2022-07-04 DIAGNOSIS — R109 Unspecified abdominal pain: Secondary | ICD-10-CM

## 2022-07-04 DIAGNOSIS — K319 Disease of stomach and duodenum, unspecified: Secondary | ICD-10-CM | POA: Diagnosis not present

## 2022-07-04 DIAGNOSIS — R519 Headache, unspecified: Secondary | ICD-10-CM | POA: Diagnosis not present

## 2022-07-04 DIAGNOSIS — K7460 Unspecified cirrhosis of liver: Secondary | ICD-10-CM | POA: Diagnosis not present

## 2022-07-04 DIAGNOSIS — D127 Benign neoplasm of rectosigmoid junction: Secondary | ICD-10-CM | POA: Insufficient documentation

## 2022-07-04 DIAGNOSIS — K3189 Other diseases of stomach and duodenum: Secondary | ICD-10-CM

## 2022-07-04 DIAGNOSIS — D649 Anemia, unspecified: Secondary | ICD-10-CM | POA: Insufficient documentation

## 2022-07-04 DIAGNOSIS — E039 Hypothyroidism, unspecified: Secondary | ICD-10-CM | POA: Diagnosis not present

## 2022-07-04 DIAGNOSIS — M1990 Unspecified osteoarthritis, unspecified site: Secondary | ICD-10-CM | POA: Diagnosis not present

## 2022-07-04 DIAGNOSIS — F32A Depression, unspecified: Secondary | ICD-10-CM | POA: Diagnosis not present

## 2022-07-04 HISTORY — PX: COLONOSCOPY WITH PROPOFOL: SHX5780

## 2022-07-04 HISTORY — PX: ESOPHAGOGASTRODUODENOSCOPY (EGD) WITH PROPOFOL: SHX5813

## 2022-07-04 SURGERY — COLONOSCOPY WITH PROPOFOL
Anesthesia: General

## 2022-07-04 MED ORDER — PROPOFOL 500 MG/50ML IV EMUL
INTRAVENOUS | Status: DC | PRN
  Administered 2022-07-04: 150 ug/kg/min via INTRAVENOUS

## 2022-07-04 MED ORDER — GLYCOPYRROLATE PF 0.2 MG/ML IJ SOSY
PREFILLED_SYRINGE | INTRAMUSCULAR | Status: DC | PRN
  Administered 2022-07-04: .2 mg via INTRAVENOUS

## 2022-07-04 MED ORDER — LIDOCAINE HCL (CARDIAC) PF 100 MG/5ML IV SOSY
PREFILLED_SYRINGE | INTRAVENOUS | Status: DC | PRN
  Administered 2022-07-04: 100 mg via INTRATRACHEAL

## 2022-07-04 MED ORDER — PROPOFOL 10 MG/ML IV BOLUS
INTRAVENOUS | Status: DC | PRN
  Administered 2022-07-04: 60 mg via INTRAVENOUS

## 2022-07-04 MED ORDER — LACTATED RINGERS IV SOLN
INTRAVENOUS | Status: DC
  Administered 2022-07-04: 1000 mL via INTRAVENOUS

## 2022-07-04 MED ORDER — LACTATED RINGERS IV SOLN: INTRAVENOUS | Status: DC | PRN

## 2022-07-04 NOTE — Op Note (Signed)
[facility] Patient Name: [name] Procedure Date: 07/04/2022 11:34 AM MRN: [id number] Date of Birth: Apr 30, 1961 Attending MD: Norvel Richards , MD, [id number] CSN: [id number] Age: 62 Admit Type: Outpatient Procedure:                Upper GI endoscopy Indications:              Abdominal pain in the right upper quadrant Providers:                Norvel Richards, MD, Lurline Del, RN, Illene Labrador Referring MD:              Medicines:                Propofol per Anesthesia Complications:            No immediate complications. Estimated Blood Loss:     Estimated blood loss was minimal. Procedure:                Pre-Anesthesia Assessment:                           - Prior to the procedure, a History and Physical                            was performed, and patient medications and                            allergies were reviewed. The patient's tolerance of                            previous anesthesia was also reviewed. The risks                            and benefits of the procedure and the sedation                            options and risks were discussed with the patient.                            All questions were answered, and informed consent                            was obtained. Prior Anticoagulants: The patient has                            taken no anticoagulant or antiplatelet agents. ASA                            Grade Assessment: II - A patient with mild systemic                            disease. After reviewing the risks and benefits,  the patient was deemed in satisfactory condition to                            undergo the procedure.                           After obtaining informed consent, the endoscope was                            passed under direct vision. Throughout the                            procedure, the patient's blood pressure, pulse, and                             oxygen saturations were monitored continuously. The                            [id number]-[id number] ([id number]) scope was introduced through the                            mouth, and advanced to the second part of duodenum.                            The upper GI endoscopy was accomplished without                            difficulty. The patient tolerated the procedure                            well. Scope In: 11:45:17 AM Scope Out: 11:50:59 AM Total Procedure Duration: 0 hours 5 minutes 42 seconds  Findings:      The examined esophagus was normal.      Gastric cavity empty. Some mottling a snakeskin or fish scale appearance       of the gastric mucosa with streaky linear erythema in the antrum. No       ulcer, infiltrating process or gastric varices. Patent pylorus.      The duodenal bulb and second portion of the duodenum were normal.       Gastric mucosal biopsies taken for histologic study. Impression:               - Normal esophagus.                           -Gastric mucosal changes consistent with portal                            hypertensive gastropathy. Status post gastric biopsy                           - Normal duodenal bulb and second portion of the                            duodenum.                           -  Moderate Sedation:      Moderate (conscious) sedation was personally administered by an       anesthesia professional. The following parameters were monitored: oxygen       saturation, heart rate, blood pressure, respiratory rate, EKG, adequacy       of pulmonary ventilation, and response to care. Recommendation:           - Patient has a contact number available for                            emergencies. The signs and symptoms of potential                            delayed complications were discussed with the                            patient. Return to normal activities tomorrow.                            Written discharge instructions were provided to the                             patient.                           - Advance diet as tolerated.                           - Continue present medications.                           - Return to GI office in 6 weeks. See colonoscopy                            report. Procedure Code(s):        --- Professional ---                           [phone], Esophagogastroduodenoscopy, flexible,                            transoral; diagnostic, including collection of                            specimen(s) by brushing or washing, when performed                            (separate procedure) Diagnosis Code(s):        --- Professional ---                           K76.6, Portal hypertension                           K31.89, Other diseases of stomach and duodenum  R10.11, Right upper quadrant pain CPT copyright 2022 [employer]. All rights reserved. The codes documented in this report are preliminary and upon coder review may  be revised to meet current compliance requirements. [name]. Tammy Luckadoo, MD Norvel Richards, MD 07/04/2022 11:59:30 AM This report has been signed electronically. Number of Addenda: 0

## 2022-07-04 NOTE — Op Note (Signed)
[facility] Patient Name: [name] Procedure Date: 07/04/2022 11:34 AM MRN: [id number] Date of Birth: 07/28/1960 Attending MD: Norvel Richards , MD, [id number] CSN: [id number] Age: 62 Admit Type: Outpatient Procedure:                Attempted colonoscopy (sigmoidoscopy) Indications:              Hematochezia Providers:                Norvel Richards, MD, Lurline Del, RN, Illene Labrador Referring MD:              Medicines:                Propofol per Anesthesia Complications:            No immediate complications. Estimated Blood Loss:     Estimated blood loss: none. Procedure:                Pre-Anesthesia Assessment:                           - Prior to the procedure, a History and Physical                            was performed, and patient medications and                            allergies were reviewed. The patient's tolerance of                            previous anesthesia was also reviewed. The risks                            and benefits of the procedure and the sedation                            options and risks were discussed with the patient.                            All questions were answered, and informed consent                            was obtained. Prior Anticoagulants: The patient has                            taken no anticoagulant or antiplatelet agents. ASA                            Grade Assessment: II - A patient with mild systemic                            disease. After reviewing the risks and benefits,  the patient was deemed in satisfactory condition to                            undergo the procedure.                           After obtaining informed consent, the colonoscope                            was passed under direct vision. Throughout the                            procedure, the patient's blood pressure, pulse, and                            oxygen  saturations were monitored continuously. The                            [id number]) scope was introduced through the                            anus and advanced to the the sigmoid colon. The                            colonoscopy was performed without difficulty. The                            patient tolerated the procedure well. The quality                            of the bowel preparation was inadequate. Scope In: 27:03:50 AM Scope Out: 11:58:08 AM Total Procedure Duration: 0 hours 1 minute 11 seconds  Findings:      Hemorrhoids were found on perianal exam. Formed stool balls found in the       rectum they trailed up into the sigmoid there was at least 1 polyp at 8       mm rectosigmoid. Procedure was aborted because preparation was       inadequate. Impression:               - Preparation of the colon was inadequate.                           - Hemorrhoids found on perianal exam. Rectosigmoid                            polyp not removed                           - No specimens collected. Moderate Sedation:      Moderate (conscious) sedation was personally administered by an       anesthesia professional. The following parameters were monitored: oxygen       saturation, heart rate, blood pressure, respiratory rate, EKG, adequacy       of pulmonary ventilation, and response to care. Recommendation:           -  Patient has a contact number available for                            emergencies. The signs and symptoms of potential                            delayed complications were discussed with the                            patient. Return to normal activities tomorrow.                            Written discharge instructions were provided to the                            patient.                           - Advance diet as tolerated.                           - Continue present medications.                           - Repeat colonoscopy (date not yet determined)                             because the bowel preparation was poor.                           - Return to GI office in 6 weeks. See EGD report. Procedure Code(s):        --- Professional ---                           [phone], 62, Colonoscopy, flexible; diagnostic,                            including collection of specimen(s) by brushing or                            washing, when performed (separate procedure) Diagnosis Code(s):        --- Professional ---                           K64.9, Unspecified hemorrhoids                           K92.1, Melena (includes Hematochezia) CPT copyright 2022 American [employer] Association. All rights reserved. The codes documented in this report are preliminary and upon coder review may  be revised to meet current compliance requirements. [name]. Zarinah Oviatt, MD Norvel Richards, MD 07/04/2022 12:03:34 PM This report has been signed electronically. Number of Addenda: 0

## 2022-07-04 NOTE — Discharge Instructions (Addendum)
EGD Discharge instructions Please read the instructions outlined below and refer to this sheet in the next few weeks. These discharge instructions provide you with general information on caring for yourself after you leave the hospital. Your doctor may also give you specific instructions. While your treatment has been planned according to the most current medical practices available, unavoidable complications occasionally occur. If you have any problems or questions after discharge, please call your doctor. ACTIVITY You may resume your regular activity but move at a slower pace for the next 24 hours.  Take frequent rest periods for the next 24 hours.  Walking will help expel (get rid of) the air and reduce the bloated feeling in your abdomen.  No driving for 24 hours (because of the anesthesia (medicine) used during the test).  You may shower.  Do not sign any important legal documents or operate any machinery for 24 hours (because of the anesthesia used during the test).  NUTRITION Drink plenty of fluids.  You may resume your normal diet.  Begin with a light meal and progress to your normal diet.  Avoid alcoholic beverages for 24 hours or as instructed by your caregiver.  MEDICATIONS You may resume your normal medications unless your caregiver tells you otherwise.  WHAT YOU CAN EXPECT TODAY You may experience abdominal discomfort such as a feeling of fullness or "gas" pains.  FOLLOW-UP Your doctor will discuss the results of your test with you.  SEEK IMMEDIATE MEDICAL ATTENTION IF ANY OF THE FOLLOWING OCCUR: Excessive nausea (feeling sick to your stomach) and/or vomiting.  Severe abdominal pain and distention (swelling).  Trouble swallowing.  Temperature over 101 F (37.8 C).  Rectal bleeding or vomiting of blood.    Colonoscopy Discharge Instructions  Read the instructions outlined below and refer to this sheet in the next few weeks. These discharge instructions provide you with  general information on caring for yourself after you leave the hospital. Your doctor may also give you specific instructions. While your treatment has been planned according to the most current medical practices available, unavoidable complications occasionally occur. If you have any problems or questions after discharge, call Dr. Gala Romney at [phone]. ACTIVITY You may resume your regular activity, but move at a slower pace for the next 24 hours.  Take frequent rest periods for the next 24 hours.  Walking will help get rid of the air and reduce the bloated feeling in your belly (abdomen).  No driving for 24 hours (because of the medicine (anesthesia) used during the test).   Do not sign any important legal documents or operate any machinery for 24 hours (because of the anesthesia used during the test).  NUTRITION Drink plenty of fluids.  You may resume your normal diet as instructed by your doctor.  Begin with a light meal and progress to your normal diet. Heavy or fried foods are harder to digest and may make you feel sick to your stomach (nauseated).  Avoid alcoholic beverages for 24 hours or as instructed.  MEDICATIONS You may resume your normal medications unless your doctor tells you otherwise.  WHAT YOU CAN EXPECT TODAY Some feelings of bloating in the abdomen.  Passage of more gas than usual.  Spotting of blood in your stool or on the toilet paper.  IF YOU HAD POLYPS REMOVED DURING THE COLONOSCOPY: No aspirin products for 7 days or as instructed.  No alcohol for 7 days or as instructed.  Eat a soft diet for the next 24 hours.  FINDING  OUT THE RESULTS OF YOUR TEST Not all test results are available during your visit. If your test results are not back during the visit, make an appointment with your caregiver to find out the results. Do not assume everything is normal if you have not heard from your caregiver or the medical facility. It is important for you to follow up on all of your test  results.  SEEK IMMEDIATE MEDICAL ATTENTION IF: You have more than a spotting of blood in your stool.  Your belly is swollen (abdominal distention).  You are nauseated or vomiting.  You have a temperature over 101.  You have abdominal pain or discomfort that is severe or gets worse throughout the day.      Your stomach was inflamed.  Biopsies were taken.  Your colon was not cleaned out.  Colonoscopy could not be completed   further recommendations to follow pending review of pathology report  Office visit with Neil Crouch in 6 weeks   at patient request, I called Tonye Pearson                  EGD Discharge instructions Please read the instructions outlined below and refer to this sheet in the next few weeks. These discharge instructions provide you with general information on caring for yourself after you leave the hospital. Your doctor may also give you specific instructions. While your treatment has been planned according to the most current medical practices available, unavoidable complications occasionally occur. If you have any problems or questions after discharge, please call your doctor. ACTIVITY You may resume your regular activity but move at a slower pace for the next 24 hours.  Take frequent rest periods for the next 24 hours.  Walking will help expel (get rid of) the air and reduce the bloated feeling in your abdomen.  No driving for 24 hours (because of the anesthesia (medicine) used during the test).  You may shower.  Do not sign any important legal documents or operate any machinery for 24 hours (because of the anesthesia used during the test).  NUTRITION Drink plenty of fluids.  You may resume your normal diet.  Begin with a light meal and progress to your normal diet.  Avoid alcoholic beverages for 24 hours or as instructed by your caregiver.  MEDICATIONS You may resume your normal medications unless your caregiver tells you otherwise.  WHAT YOU  CAN EXPECT TODAY You may experience abdominal discomfort such as a feeling of fullness or "gas" pains.  FOLLOW-UP Your doctor will discuss the results of your test with you.  SEEK IMMEDIATE MEDICAL ATTENTION IF ANY OF THE FOLLOWING OCCUR: Excessive nausea (feeling sick to your stomach) and/or vomiting.  Severe abdominal pain and distention (swelling).  Trouble swallowing.  Temperature over 101 F (37.8 C).  Rectal bleeding or vomiting of blood.     Colonoscopy Discharge Instructions  Read the instructions outlined below and refer to this sheet in the next few weeks. These discharge instructions provide you with general information on caring for yourself after you leave the hospital. Your doctor may also give you specific instructions. While your treatment has been planned according to the most current medical practices available, unavoidable complications occasionally occur. If you have any problems or questions after discharge, call Dr. Gala Romney at [phone]. ACTIVITY You may resume your regular activity, but move at a slower pace for the next 24 hours.  Take frequent rest periods for the next 24 hours.  Walking will help get  rid of the air and reduce the bloated feeling in your belly (abdomen).  No driving for 24 hours (because of the medicine (anesthesia) used during the test).   Do not sign any important legal documents or operate any machinery for 24 hours (because of the anesthesia used during the test).  NUTRITION Drink plenty of fluids.  You may resume your normal diet as instructed by your doctor.  Begin with a light meal and progress to your normal diet. Heavy or fried foods are harder to digest and may make you feel sick to your stomach (nauseated).  Avoid alcoholic beverages for 24 hours or as instructed.  MEDICATIONS You may resume your normal medications unless your doctor tells you otherwise.  WHAT YOU CAN EXPECT TODAY Some feelings of bloating in the abdomen.  Passage of  more gas than usual.  Spotting of blood in your stool or on the toilet paper.  IF YOU HAD POLYPS REMOVED DURING THE COLONOSCOPY: No aspirin products for 7 days or as instructed.  No alcohol for 7 days or as instructed.  Eat a soft diet for the next 24 hours.  FINDING OUT THE RESULTS OF YOUR TEST Not all test results are available during your visit. If your test results are not back during the visit, make an appointment with your caregiver to find out the results. Do not assume everything is normal if you have not heard from your caregiver or the medical facility. It is important for you to follow up on all of your test results.  SEEK IMMEDIATE MEDICAL ATTENTION IF: You have more than a spotting of blood in your stool.  Your belly is swollen (abdominal distention).  You are nauseated or vomiting.  You have a temperature over 101.  You have abdominal pain or discomfort that is severe or gets worse throughout the day.       stomach was inflamed.  Biopsies were taken.  Your colonoscopy could not be done because you were not cleaned out  Further recommendations to follow pending review of pathology report  Office visit with Neil Crouch in 6 weeks   at patient request I called Bretta Bang at [phone] -  reviewed findings and recommendations

## 2022-07-04 NOTE — Anesthesia Preprocedure Evaluation (Addendum)
Anesthesia Evaluation  Patient identified by MRN, date of birth, ID band Patient awake    Reviewed: Allergy & Precautions, H&P , NPO status , Patient's Chart, lab work & pertinent test results, reviewed documented beta blocker date and time   Airway Mallampati: II  TM Distance: >3 FB Neck ROM: Full   Comment: Bony prominence on hard palate Dental  (+) Dental Advisory Given, Edentulous Upper, Missing   Pulmonary shortness of breath and with exertion, asthma , COPD, Current Smoker and Patient abstained from smoking.   Pulmonary exam normal breath sounds clear to auscultation       Cardiovascular Exercise Tolerance: Poor hypertension, Pt. on medications and Pt. on home beta blockers + Past MI  Normal cardiovascular exam Rhythm:Regular Rate:Normal     Neuro/Psych  Headaches PSYCHIATRIC DISORDERS Anxiety Depression     Neuromuscular disease    GI/Hepatic ,GERD  Medicated and Controlled,,(+) Cirrhosis   ascites      Endo/Other  Hypothyroidism    Renal/GU negative Renal ROS  negative genitourinary   Musculoskeletal  (+) Arthritis , Osteoarthritis,    Abdominal   Peds negative pediatric ROS (+)  Hematology  (+) Blood dyscrasia, anemia   Anesthesia Other Findings   Reproductive/Obstetrics negative OB ROS                             Anesthesia Physical Anesthesia Plan  ASA: 3  Anesthesia Plan: General   Post-op Pain Management: Minimal or no pain anticipated   Induction: Intravenous  PONV Risk Score and Plan: Propofol infusion  Airway Management Planned: Nasal Cannula and Natural Airway  Additional Equipment:   Intra-op Plan:   Post-operative Plan:   Informed Consent: I have reviewed the patients History and Physical, chart, labs and discussed the procedure including the risks, benefits and alternatives for the proposed anesthesia with the patient or authorized representative who  has indicated his/her understanding and acceptance.     Dental advisory given  Plan Discussed with: CRNA and Surgeon  Anesthesia Plan Comments:        Anesthesia Quick Evaluation

## 2022-07-04 NOTE — Transfer of Care (Signed)
Immediate Anesthesia Transfer of Care Note  Patient: [name]  Procedure(s) Performed: COLONOSCOPY WITH PROPOFOL ESOPHAGOGASTRODUODENOSCOPY (EGD) WITH PROPOFOL  Patient Location: PACU  Anesthesia Type:General  Level of Consciousness: awake, alert , oriented and patient cooperative  Airway & Oxygen Therapy: Patient Spontanous Breathing  Post-op Assessment: Report given to RN, Post -op Vital signs reviewed and stable and Patient moving all extremities X 4  Post vital signs: Reviewed and stable  Last Vitals:  Vitals Value Taken Time  BP 103/44 07/04/22 1203  Temp 36.4 C 07/04/22 1203  Pulse 81 07/04/22 1203  Resp 15 07/04/22 1203  SpO2 99 % 07/04/22 1203    Last Pain:  Vitals:   07/04/22 1203  TempSrc: Oral  PainSc: 0-No pain      Patients Stated Pain Goal: 6 (16/01/09 3235)  Complications: No notable events documented.

## 2022-07-04 NOTE — Interval H&P Note (Signed)
History and Physical Interval Note:  07/04/2022 11:34 AM  [name]  has presented today for surgery, with the diagnosis of GERD,cirrhosis,rectal bleeding,RUQ pain,elevated LFTs.  The various methods of treatment have been discussed with the patient and family. After consideration of risks, benefits and other options for treatment, the patient has consented to  Procedure(s) with comments: COLONOSCOPY WITH PROPOFOL (N/A) - 10:15 am ESOPHAGOGASTRODUODENOSCOPY (EGD) WITH PROPOFOL (N/A) as a surgical intervention.  The patient's history has been reviewed, patient examined, no change in status, stable for surgery.  I have reviewed the patient's chart and labs.  Questions were answered to the patient's satisfaction.     [name]    Patient seen in short stay.  No change.  Denies dysphagia.  Have offered the patient both an EGD and a colonoscopy per plan.  The risks, benefits, limitations, imponderables and alternatives regarding both EGD and colonoscopy have been reviewed with the patient. Questions have been answered. All parties agreeable.

## 2022-07-04 NOTE — Anesthesia Postprocedure Evaluation (Signed)
Anesthesia Post Note  Patient: RICHELL CORKER  Procedure(s) Performed: COLONOSCOPY WITH PROPOFOL ESOPHAGOGASTRODUODENOSCOPY (EGD) WITH PROPOFOL  Patient location during evaluation: Phase II Anesthesia Type: General Level of consciousness: awake and alert and oriented Pain management: pain level controlled Vital Signs Assessment: post-procedure vital signs reviewed and stable Respiratory status: spontaneous breathing, nonlabored ventilation, respiratory function stable and patient connected to nasal cannula oxygen Cardiovascular status: blood pressure returned to baseline and stable Postop Assessment: no apparent nausea or vomiting Anesthetic complications: no  No notable events documented.   Last Vitals:  Vitals:   07/04/22 1036 07/04/22 1203  BP:  (!) 103/44  Pulse:  81  Resp:  15  Temp: 36.4 C 36.4 C  SpO2: 97% 99%    Last Pain:  Vitals:   07/04/22 1203  TempSrc: Oral  PainSc: 0-No pain                 Henriette Hesser C Izyk Marty

## 2022-07-05 LAB — SURGICAL PATHOLOGY

## 2022-07-08 ENCOUNTER — Encounter: Payer: Self-pay | Admitting: Internal Medicine

## 2022-07-12 ENCOUNTER — Encounter (HOSPITAL_COMMUNITY): Payer: Self-pay | Admitting: Internal Medicine

## 2022-07-24 ENCOUNTER — Ambulatory Visit: Payer: Medicare HMO | Admitting: Gastroenterology

## 2022-07-26 DIAGNOSIS — I700 Atherosclerosis of aorta: Secondary | ICD-10-CM | POA: Diagnosis not present

## 2022-07-26 DIAGNOSIS — R69 Illness, unspecified: Secondary | ICD-10-CM | POA: Diagnosis not present

## 2022-07-26 DIAGNOSIS — I10 Essential (primary) hypertension: Secondary | ICD-10-CM | POA: Diagnosis not present

## 2022-07-26 DIAGNOSIS — M5136 Other intervertebral disc degeneration, lumbar region: Secondary | ICD-10-CM | POA: Diagnosis not present

## 2022-07-26 DIAGNOSIS — E6609 Other obesity due to excess calories: Secondary | ICD-10-CM | POA: Diagnosis not present

## 2022-07-26 DIAGNOSIS — F5101 Primary insomnia: Secondary | ICD-10-CM | POA: Diagnosis not present

## 2022-07-26 DIAGNOSIS — E538 Deficiency of other specified B group vitamins: Secondary | ICD-10-CM | POA: Diagnosis not present

## 2022-07-26 DIAGNOSIS — Z6836 Body mass index (BMI) 36.0-36.9, adult: Secondary | ICD-10-CM | POA: Diagnosis not present

## 2022-07-26 DIAGNOSIS — G894 Chronic pain syndrome: Secondary | ICD-10-CM | POA: Diagnosis not present

## 2022-07-26 DIAGNOSIS — D473 Essential (hemorrhagic) thrombocythemia: Secondary | ICD-10-CM | POA: Diagnosis not present

## 2022-07-26 DIAGNOSIS — K7469 Other cirrhosis of liver: Secondary | ICD-10-CM | POA: Diagnosis not present

## 2022-08-06 DIAGNOSIS — M4317 Spondylolisthesis, lumbosacral region: Secondary | ICD-10-CM | POA: Diagnosis not present

## 2022-08-06 DIAGNOSIS — Z6834 Body mass index (BMI) 34.0-34.9, adult: Secondary | ICD-10-CM | POA: Diagnosis not present

## 2022-08-06 DIAGNOSIS — D171 Benign lipomatous neoplasm of skin and subcutaneous tissue of trunk: Secondary | ICD-10-CM | POA: Diagnosis not present

## 2022-08-07 ENCOUNTER — Encounter: Payer: Self-pay | Admitting: Internal Medicine

## 2022-08-07 ENCOUNTER — Ambulatory Visit: Payer: Medicare HMO | Admitting: Internal Medicine

## 2022-08-09 ENCOUNTER — Other Ambulatory Visit: Payer: Self-pay | Admitting: Internal Medicine

## 2022-08-09 DIAGNOSIS — Z1231 Encounter for screening mammogram for malignant neoplasm of breast: Secondary | ICD-10-CM

## 2022-08-10 ENCOUNTER — Ambulatory Visit
Admission: RE | Admit: 2022-08-10 | Discharge: 2022-08-10 | Disposition: A | Discharge status: Home / Self Care | Payer: Medicare HMO | Source: Ambulatory Visit | Attending: Internal Medicine | Admitting: Internal Medicine

## 2022-08-10 DIAGNOSIS — Z1231 Encounter for screening mammogram for malignant neoplasm of breast: Secondary | ICD-10-CM

## 2022-08-14 ENCOUNTER — Other Ambulatory Visit (HOSPITAL_COMMUNITY): Payer: Self-pay | Admitting: Neurosurgery

## 2022-08-14 DIAGNOSIS — D171 Benign lipomatous neoplasm of skin and subcutaneous tissue of trunk: Secondary | ICD-10-CM

## 2022-08-15 ENCOUNTER — Other Ambulatory Visit: Payer: Self-pay | Admitting: Internal Medicine

## 2022-08-15 DIAGNOSIS — R928 Other abnormal and inconclusive findings on diagnostic imaging of breast: Secondary | ICD-10-CM

## 2022-08-21 NOTE — Progress Notes (Unsigned)
GI Office Note    Referring Provider: Elfredia Nevins, MD Primary Care Physician:  Elfredia Nevins, MD  Primary Gastroenterologist:  Chief Complaint   No chief complaint on file.   History of Present Illness   [name] is a 62 y.o. female presenting today for follow-up.  She has a history of cirrhosis of unclear etiology.  History of mildly elevated ASMA in the past but normal in 2020.  She is immune to hepatitis B.  ANA previously negative.  AMA negative.  Chronically elevated alkaline phosphatase. Labs back in January showed low fasting a.m. cortisol level, referred to endocrinology for evaluation.  MELD sodium of 8.  ANA positive, AMA negative, ASMA 1:20 titer, alkaline phosphatase 309, AST 61, ALT 32, total bilirubin 0.6, AFP 3.9.     EGD January 2024:  -Gastric mucosal changes consistent with portal hypertensive gastropathy -Gastric biopsy showed mild nonspecific reactive gastropathy, gastric oxyntic mucosa with parietal cell hyperplasia as can be seen in hypergastrinemia state such as PPI therapy.  No H. pylori.  Attempted colonoscopy January 2024: -Preparation of the colon was inadequate.  Formed stool balls found in the rectum traveling up to the sigmoid colon.  8 mm polyp seen in the rectosigmoid colon, not removed. -Hemorrhoids found on perianal exam.     Medications   Current Outpatient Medications  Medication Sig Dispense Refill   ALPRAZolam (XANAX) 1 MG tablet Take 1 mg by mouth 3 (three) times daily as needed for anxiety.     amLODipine (NORVASC) 5 MG tablet Take 1 tablet (5 mg total) by mouth daily. 30 tablet 3   baclofen (LIORESAL) 10 MG tablet Take 10 mg by mouth 3 (three) times daily.     cyclobenzaprine (FLEXERIL) 10 MG tablet Take 10 mg by mouth 3 (three) times daily.     DULoxetine (CYMBALTA) 30 MG capsule Take 30 mg by mouth daily.     DULoxetine (CYMBALTA) 60 MG capsule Take 60 mg by mouth at bedtime.     estradiol (ESTRACE) 1 MG tablet  Take 1 mg by mouth daily.     levothyroxine (SYNTHROID, LEVOTHROID) 25 MCG tablet Take 25 mcg by mouth daily.     metoprolol tartrate (LOPRESSOR) 25 MG tablet Take 1 tablet (25 mg total) by mouth 2 (two) times daily. 60 tablet 2   omeprazole (PRILOSEC) 20 MG capsule Take 20 mg by mouth at bedtime.      oxycodone (ROXICODONE) 30 MG immediate release tablet Take 30 mg by mouth 4 (four) times daily as needed.     promethazine (PHENERGAN) 50 MG tablet Take 50 mg by mouth 2 (two) times daily as needed.     venlafaxine XR (EFFEXOR-XR) 75 MG 24 hr capsule Take 75 mg by mouth daily.     zolpidem (AMBIEN) 10 MG tablet Take 10 mg by mouth at bedtime.       No current facility-administered medications for this visit.    Allergies   Allergies as of 08/22/2022 - Review Complete 07/04/2022  Allergen Reaction Noted   Codeine Hives and Other (See Comments) 03/03/2008   Latex Hives and Itching 02/23/2011   Lyrica [pregabalin] Hives 11/20/2016   Nsaids  11/20/2016   Other Hives and Itching 02/23/2013     Past Medical History   Past Medical History:  Diagnosis Date   Anemia    Anxiety    Asthma    COPD (chronic obstructive pulmonary disease) (HCC)    Depression    Disc degeneration,  lumbar    GERD (gastroesophageal reflux disease)    Hypertension    Leukocytosis    Lumbar disc disease    Shortness of breath    Thrombocytosis     Past Surgical History   Past Surgical History:  Procedure Laterality Date   ABDOMINAL HYSTERECTOMY     BIOPSY  03/25/2014   Procedure: GASTRIC BIOPSIES;  Surgeon: Corbin Ade, MD;  Location: [facility];  Service: Endoscopy;;   COLONOSCOPY  02/21/2007   VHQ:IONG papilla and internal hemorrhoids, diminutive rectal polyp/Left-sided diverticula (sigmoid) pedunculated polyps mid descending   COLONOSCOPY  06/04/2006   Dr. Jena Gauss: segmental biopsy negative for microscopic colitis. Three polyps, one tubular adenoma. Surveillance due 2013.    COLONOSCOPY WITH PROPOFOL  N/A 03/25/2014   RMR: Rectal and colonic polyps removed as described above.  Colonic diverticulosis. CT abnormality likely artifactual in orgin     COLONOSCOPY WITH PROPOFOL N/A 07/04/2022   Procedure: COLONOSCOPY WITH PROPOFOL;  Surgeon: Corbin Ade, MD;  [facility]: [facility];  Service: Endoscopy;  Laterality: N/A;  10:15 am   ESOPHAGOGASTRODUODENOSCOPY  08/08/2005   EXB:MWUXLK esophagogastroduodenoscopy.   ESOPHAGOGASTRODUODENOSCOPY  06/04/2009   Dr. Jena Gauss: normal esophagus, small hiatal hernia, duodenal diverticulum, negative H.pylori and celiac   ESOPHAGOGASTRODUODENOSCOPY (EGD) WITH PROPOFOL N/A 03/25/2014   RMR: Probable occult cervical esophageal web- status post dilation as described above. Abnormal gastic mucosa of uncertain significance-status post gasrtric biopsy   ESOPHAGOGASTRODUODENOSCOPY (EGD) WITH PROPOFOL N/A 07/04/2022   Procedure: ESOPHAGOGASTRODUODENOSCOPY (EGD) WITH PROPOFOL;  Surgeon: Corbin Ade, MD;  Location: [facility];  Service: Endoscopy;  Laterality: N/A;   MALONEY DILATION N/A 03/25/2014   Procedure: Elease Hashimoto ESOPHAGEAL DILATION #54 Fr;  Surgeon: Corbin Ade, MD;  Location: [facility];  Service: Endoscopy;  Laterality: N/A;   POLYPECTOMY  03/25/2014   Procedure: SIGMOID AND RECTAL POLYPECTOMY;  Surgeon: Corbin Ade, MD;  Location: [facility];  Service: Endoscopy;;   right knee surgery     rt breast biopsy     TOTAL ABDOMINAL HYSTERECTOMY W/ BILATERAL SALPINGOOPHORECTOMY     age 56    Past Family History   Family History  Adopted: Yes  Problem Relation Age of Onset   Pancreatic cancer Mother    Leukemia Sister        1/2 sister   Pancreatic cancer Maternal Aunt    Kidney cancer Maternal Uncle    Pancreatic cancer Maternal Uncle    Colon cancer Paternal Aunt    Pancreatic cancer Paternal Grandmother    Kidney cancer Paternal Grandfather    Hepatitis C Brother    Liver cancer Brother        suicide   Colon cancer Brother 30   Breast cancer  Neg Hx     Past Social History   Social History   Socioeconomic History   Marital status: Single    Spouse name: Not on file   Number of children: Not on file   Years of education: Not on file   Highest education level: Not on file  Occupational History   Not on file  Tobacco Use   Smoking status: Every Day    Packs/day: 0.50    Years: 30.00    Additional pack years: 0.00    Total pack years: 15.00    Types: Cigarettes   Smokeless tobacco: Never  Vaping Use   Vaping Use: Never used  Substance and Sexual Activity   Alcohol use: No    Comment: see HPI.  Patient denies alcohol use at present/past.   Drug use: No   Sexual activity: Not Currently  Other Topics Concern   Not on file  Social History Narrative   Not on file   Social Determinants of Health   Financial Resource Strain: Not on file  Food Insecurity: Not on file  Transportation Needs: Not on file  Physical Activity: Not on file  Stress: Not on file  Social Connections: Not on file  Intimate Partner Violence: Not on file    Review of Systems   General: Negative for anorexia, weight loss, fever, chills, fatigue, weakness. ENT: Negative for hoarseness, difficulty swallowing , nasal congestion. CV: Negative for chest pain, angina, palpitations, dyspnea on exertion, peripheral edema.  Respiratory: Negative for dyspnea at rest, dyspnea on exertion, cough, sputum, wheezing.  GI: See history of present illness. GU:  Negative for dysuria, hematuria, urinary incontinence, urinary frequency, nocturnal urination.  Endo: Negative for unusual weight change.     Physical Exam   There were no vitals taken for this visit.   General: Well-nourished, well-developed in no acute distress.  Eyes: No icterus. Mouth: Oropharyngeal mucosa moist and pink , no lesions erythema or exudate. Lungs: Clear to auscultation bilaterally.  Heart: Regular rate and rhythm, no murmurs rubs or gallops.  Abdomen: Bowel sounds are normal,  nontender, nondistended, no hepatosplenomegaly or masses,  no abdominal bruits or hernia , no rebound or guarding.  Rectal: ***  Extremities: No lower extremity edema. No clubbing or deformities. Neuro: Alert and oriented x 4   Skin: Warm and dry, no jaundice.   Psych: Alert and cooperative, normal mood and affect.  Labs   *** Imaging Studies   MM 3D SCREENING MAMMOGRAM BILATERAL BREAST  Result Date: 08/14/2022 CLINICAL DATA:  Screening. EXAM: DIGITAL SCREENING BILATERAL MAMMOGRAM WITH TOMOSYNTHESIS AND CAD TECHNIQUE: Bilateral screening digital craniocaudal and mediolateral oblique mammograms were obtained. Bilateral screening digital breast tomosynthesis was performed. The images were evaluated with computer-aided detection. COMPARISON:  None. ACR Breast Density Category c: The breasts are heterogeneously dense, which may obscure small masses. FINDINGS: In the right breast, a possible asymmetry warrants further evaluation. In the left breast, no findings suspicious for malignancy. IMPRESSION: Further evaluation is suggested for possible asymmetry in the right breast. RECOMMENDATION: Diagnostic mammogram and possibly ultrasound of the right breast. (Code:FI-R-63M) The patient will be contacted regarding the findings, and additional imaging will be scheduled. BI-RADS CATEGORY  0: Incomplete: Need additional imaging evaluation. Electronically Signed   By: Elberta Fortis M.D.   On: 08/14/2022 13:29    Assessment       PLAN   ***   [name]. Melvyn Neth, MHS, PA-C [facility]

## 2022-08-21 NOTE — H&P (View-Only) (Signed)
   GI Office Note    Referring Provider: Fusco, Lawrence, MD Primary Care Physician:  Fusco, Lawrence, MD  Primary Gastroenterologist:  Chief Complaint   No chief complaint on file.   History of Present Illness   [name] is a 62 y.o. female presenting today for follow-up.  She has a history of cirrhosis of unclear etiology.  History of mildly elevated ASMA in the past but normal in 2020.  She is immune to hepatitis B.  ANA previously negative.  AMA negative.  Chronically elevated alkaline phosphatase. Labs back in January showed low fasting a.m. cortisol level, referred to endocrinology for evaluation.  MELD sodium of 8.  ANA positive, AMA negative, ASMA 1:20 titer, alkaline phosphatase 309, AST 61, ALT 32, total bilirubin 0.6, AFP 3.9.     EGD January 2024:  -Gastric mucosal changes consistent with portal hypertensive gastropathy -Gastric biopsy showed mild nonspecific reactive gastropathy, gastric oxyntic mucosa with parietal cell hyperplasia as can be seen in hypergastrinemia state such as PPI therapy.  No H. pylori.  Attempted colonoscopy January 2024: -Preparation of the colon was inadequate.  Formed stool balls found in the rectum traveling up to the sigmoid colon.  8 mm polyp seen in the rectosigmoid colon, not removed. -Hemorrhoids found on perianal exam.     Medications   Current Outpatient Medications  Medication Sig Dispense Refill   ALPRAZolam (XANAX) 1 MG tablet Take 1 mg by mouth 3 (three) times daily as needed for anxiety.     amLODipine (NORVASC) 5 MG tablet Take 1 tablet (5 mg total) by mouth daily. 30 tablet 3   baclofen (LIORESAL) 10 MG tablet Take 10 mg by mouth 3 (three) times daily.     cyclobenzaprine (FLEXERIL) 10 MG tablet Take 10 mg by mouth 3 (three) times daily.     DULoxetine (CYMBALTA) 30 MG capsule Take 30 mg by mouth daily.     DULoxetine (CYMBALTA) 60 MG capsule Take 60 mg by mouth at bedtime.     estradiol (ESTRACE) 1 MG tablet  Take 1 mg by mouth daily.     levothyroxine (SYNTHROID, LEVOTHROID) 25 MCG tablet Take 25 mcg by mouth daily.     metoprolol tartrate (LOPRESSOR) 25 MG tablet Take 1 tablet (25 mg total) by mouth 2 (two) times daily. 60 tablet 2   omeprazole (PRILOSEC) 20 MG capsule Take 20 mg by mouth at bedtime.      oxycodone (ROXICODONE) 30 MG immediate release tablet Take 30 mg by mouth 4 (four) times daily as needed.     promethazine (PHENERGAN) 50 MG tablet Take 50 mg by mouth 2 (two) times daily as needed.     venlafaxine XR (EFFEXOR-XR) 75 MG 24 hr capsule Take 75 mg by mouth daily.     zolpidem (AMBIEN) 10 MG tablet Take 10 mg by mouth at bedtime.       No current facility-administered medications for this visit.    Allergies   Allergies as of 08/22/2022 - Review Complete 07/04/2022  Allergen Reaction Noted   Codeine Hives and Other (See Comments) 03/03/2008   Latex Hives and Itching 02/23/2011   Lyrica [pregabalin] Hives 11/20/2016   Nsaids  11/20/2016   Other Hives and Itching 02/23/2013     Past Medical History   Past Medical History:  Diagnosis Date   Anemia    Anxiety    Asthma    COPD (chronic obstructive pulmonary disease) (HCC)    Depression    Disc degeneration,   lumbar    GERD (gastroesophageal reflux disease)    Hypertension    Leukocytosis    Lumbar disc disease    Shortness of breath    Thrombocytosis     Past Surgical History   Past Surgical History:  Procedure Laterality Date   ABDOMINAL HYSTERECTOMY     BIOPSY  03/25/2014   Procedure: GASTRIC BIOPSIES;  Surgeon: Robert M Rourk, MD;  Location: [facility];  Service: Endoscopy;;   COLONOSCOPY  02/21/2007   RMR:Anal papilla and internal hemorrhoids, diminutive rectal polyp/Left-sided diverticula (sigmoid) pedunculated polyps mid descending   COLONOSCOPY  06/04/2006   Dr. Rourk: segmental biopsy negative for microscopic colitis. Three polyps, one tubular adenoma. Surveillance due 2013.    COLONOSCOPY WITH PROPOFOL  N/A 03/25/2014   RMR: Rectal and colonic polyps removed as described above.  Colonic diverticulosis. CT abnormality likely artifactual in orgin     COLONOSCOPY WITH PROPOFOL N/A 07/04/2022   Procedure: COLONOSCOPY WITH PROPOFOL;  Surgeon: Rourk, Robert M, MD;  [facility];  Service: Endoscopy;  Laterality: N/A;  10:15 am   ESOPHAGOGASTRODUODENOSCOPY  08/08/2005   NUR:Normal esophagogastroduodenoscopy.   ESOPHAGOGASTRODUODENOSCOPY  06/04/2009   Dr. 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 Abnormal gastic mucosa of uncertain significance-status post gasrtric biopsy   ESOPHAGOGASTRODUODENOSCOPY (EGD) WITH PROPOFOL N/A 07/04/2022   Procedure: ESOPHAGOGASTRODUODENOSCOPY (EGD) WITH PROPOFOL;  Surgeon: Rourk, Robert M, MD;  [facility];  Service: Endoscopy;  Laterality: N/A;   MALONEY DILATION N/A 03/25/2014   Procedure: MALONEY ESOPHAGEAL DILATION #54 Fr;  Surgeon: Robert M Rourk, MD;  Location: [facility];  Service: Endoscopy;  Laterality: N/A;   POLYPECTOMY  03/25/2014   Procedure: SIGMOID AND RECTAL POLYPECTOMY;  Surgeon: Robert M Rourk, MD;  Location: [facility];  Service: Endoscopy;;   right knee surgery     rt breast biopsy     TOTAL ABDOMINAL HYSTERECTOMY W/ BILATERAL SALPINGOOPHORECTOMY     age 29    Past Family History   Family History  Adopted: Yes  Problem Relation Age of Onset   Pancreatic cancer Mother    Leukemia Sister        1/2 sister   Pancreatic cancer Maternal Aunt    Kidney cancer Maternal Uncle    Pancreatic cancer Maternal Uncle    Colon cancer Paternal Aunt    Pancreatic cancer Paternal Grandmother    Kidney cancer Paternal Grandfather    Hepatitis C Brother    Liver cancer Brother        suicide   Colon cancer Brother 52   Breast cancer  Neg Hx     Past Social History   Social History   Socioeconomic History   Marital status: Single    Spouse name: Not on file   Number of children: Not on file   Years of education: Not on file   Highest education level: Not on file  Occupational History   Not on file  Tobacco Use   Smoking status: Every Day    Packs/day: 0.50    Years: 30.00    Additional pack years: 0.00    Total pack years: 15.00    Types: Cigarettes   Smokeless tobacco: Never  Vaping Use   Vaping Use: Never used  Substance and Sexual Activity   Alcohol use: No    Comment: see HPI.   Patient denies alcohol use at present/past.   Drug use: No   Sexual activity: Not Currently  Other Topics Concern   Not on file  Social History Narrative   Not on file   Social Determinants of Health   Financial Resource Strain: Not on file  Food Insecurity: Not on file  Transportation Needs: Not on file  Physical Activity: Not on file  Stress: Not on file  Social Connections: Not on file  Intimate Partner Violence: Not on file    Review of Systems   General: Negative for anorexia, weight loss, fever, chills, fatigue, weakness. ENT: Negative for hoarseness, difficulty swallowing , nasal congestion. CV: Negative for chest pain, angina, palpitations, dyspnea on exertion, peripheral edema.  Respiratory: Negative for dyspnea at rest, dyspnea on exertion, cough, sputum, wheezing.  GI: See history of present illness. GU:  Negative for dysuria, hematuria, urinary incontinence, urinary frequency, nocturnal urination.  Endo: Negative for unusual weight change.     Physical Exam   There were no vitals taken for this visit.   General: Well-nourished, well-developed in no acute distress.  Eyes: No icterus. Mouth: Oropharyngeal mucosa moist and pink , no lesions erythema or exudate. Lungs: Clear to auscultation bilaterally.  Heart: Regular rate and rhythm, no murmurs rubs or gallops.  Abdomen: Bowel sounds are normal,  nontender, nondistended, no hepatosplenomegaly or masses,  no abdominal bruits or hernia , no rebound or guarding.  Rectal: ***  Extremities: No lower extremity edema. No clubbing or deformities. Neuro: Alert and oriented x 4   Skin: Warm and dry, no jaundice.   Psych: Alert and cooperative, normal mood and affect.  Labs   *** Imaging Studies   MM 3D SCREENING MAMMOGRAM BILATERAL BREAST  Result Date: 08/14/2022 CLINICAL DATA:  Screening. EXAM: DIGITAL SCREENING BILATERAL MAMMOGRAM WITH TOMOSYNTHESIS AND CAD TECHNIQUE: Bilateral screening digital craniocaudal and mediolateral oblique mammograms were obtained. Bilateral screening digital breast tomosynthesis was performed. The images were evaluated with computer-aided detection. COMPARISON:  None. ACR Breast Density Category c: The breasts are heterogeneously dense, which may obscure small masses. FINDINGS: In the right breast, a possible asymmetry warrants further evaluation. In the left breast, no findings suspicious for malignancy. IMPRESSION: Further evaluation is suggested for possible asymmetry in the right breast. RECOMMENDATION: Diagnostic mammogram and possibly ultrasound of the right breast. (Code:[id number]-00M) The patient will be contacted regarding the findings, and additional imaging will be scheduled. BI-RADS CATEGORY  0: Incomplete: Need additional imaging evaluation. Electronically Signed   By: Daniel  Boyle M.D.   On: 08/14/2022 13:29    Assessment       PLAN   ***   Selia Wareing S. Ramiah Helfrich, MHS, PA-C [facility]  

## 2022-08-22 ENCOUNTER — Ambulatory Visit (INDEPENDENT_AMBULATORY_CARE_PROVIDER_SITE_OTHER): Payer: Medicare HMO | Admitting: Gastroenterology

## 2022-08-22 ENCOUNTER — Encounter: Payer: Self-pay | Admitting: Gastroenterology

## 2022-08-22 VITALS — BP 123/80 | HR 76 | Temp 98.2°F | Ht 66.0 in | Wt 208.8 lb

## 2022-08-22 DIAGNOSIS — R7989 Other specified abnormal findings of blood chemistry: Secondary | ICD-10-CM

## 2022-08-22 DIAGNOSIS — K7460 Unspecified cirrhosis of liver: Secondary | ICD-10-CM | POA: Diagnosis not present

## 2022-08-22 DIAGNOSIS — Z860100 Personal history of colon polyps, unspecified: Secondary | ICD-10-CM

## 2022-08-22 DIAGNOSIS — R1011 Right upper quadrant pain: Secondary | ICD-10-CM | POA: Diagnosis not present

## 2022-08-22 DIAGNOSIS — R82998 Other abnormal findings in urine: Secondary | ICD-10-CM | POA: Insufficient documentation

## 2022-08-22 DIAGNOSIS — K219 Gastro-esophageal reflux disease without esophagitis: Secondary | ICD-10-CM | POA: Diagnosis not present

## 2022-08-22 NOTE — Patient Instructions (Addendum)
I will discuss your liver issues/labs with colleagues at the liver center in [place] and get back in touch with regards to possible liver biopsy. We will work on referral to endocrinology. We will work on rescheduling you for colonoscopy.  Continue omeprazole 20mg  daily.

## 2022-08-23 ENCOUNTER — Encounter: Payer: Self-pay | Admitting: Gastroenterology

## 2022-08-23 ENCOUNTER — Telehealth: Payer: Self-pay | Admitting: Gastroenterology

## 2022-08-23 DIAGNOSIS — K7469 Other cirrhosis of liver: Secondary | ICD-10-CM | POA: Diagnosis not present

## 2022-08-23 DIAGNOSIS — D45 Polycythemia vera: Secondary | ICD-10-CM | POA: Diagnosis not present

## 2022-08-23 DIAGNOSIS — I10 Essential (primary) hypertension: Secondary | ICD-10-CM | POA: Diagnosis not present

## 2022-08-23 DIAGNOSIS — Z6836 Body mass index (BMI) 36.0-36.9, adult: Secondary | ICD-10-CM | POA: Diagnosis not present

## 2022-08-23 DIAGNOSIS — E6609 Other obesity due to excess calories: Secondary | ICD-10-CM | POA: Diagnosis not present

## 2022-08-23 DIAGNOSIS — R69 Illness, unspecified: Secondary | ICD-10-CM | POA: Diagnosis not present

## 2022-08-23 DIAGNOSIS — R899 Unspecified abnormal finding in specimens from other organs, systems and tissues: Secondary | ICD-10-CM

## 2022-08-23 DIAGNOSIS — D473 Essential (hemorrhagic) thrombocythemia: Secondary | ICD-10-CM | POA: Diagnosis not present

## 2022-08-23 DIAGNOSIS — M5136 Other intervertebral disc degeneration, lumbar region: Secondary | ICD-10-CM | POA: Diagnosis not present

## 2022-08-23 DIAGNOSIS — I700 Atherosclerosis of aorta: Secondary | ICD-10-CM | POA: Diagnosis not present

## 2022-08-23 DIAGNOSIS — E538 Deficiency of other specified B group vitamins: Secondary | ICD-10-CM | POA: Diagnosis not present

## 2022-08-23 NOTE — Telephone Encounter (Signed)
Patient needs colonoscopy with extended two day bowel prep. Recently had poor prep due to vomiting Trilyte.   TCS with Rourk. ASA 3. Dx: rectal bleeding, h/o colon polyps -two full days of clear liquids -start bisacodyl 10mg  po daily for 3 days before bowel prep starts -two days before colonoscopy, take bisacodyl 10mg  around 2pm. Around 3pm, have her take miralax  (7 capfuls mixed in 32 ounces of water or gatorade), drink within two hours.  -one day before colonoscopy due standard Clenpiq split prep EXCEPT start two hours earlier than usual.     Patient needs endocrinology referral for low fasting AM cortisol. The referral to [facility] was denied but I was not notified. Please send elsewhere, patient preference.     Manuela Schwartz, Please NIC for RUQ U/S in 11/2022 for hepatoma screening.

## 2022-08-24 ENCOUNTER — Encounter: Payer: Self-pay | Admitting: *Deleted

## 2022-08-24 NOTE — Telephone Encounter (Signed)
Spoke with pt. She has been scheduled for 09/05/22. Aware she will need pre-op appt prior and will let her know when scheduled. She will stop by office next week to pick up sample/instructions. Also no preference for referral.

## 2022-08-24 NOTE — Addendum Note (Signed)
Addended by: Cheron Every on: 08/24/2022 09:00 AM   Modules accepted: Orders

## 2022-08-29 ENCOUNTER — Ambulatory Visit
Admission: RE | Admit: 2022-08-29 | Discharge: 2022-08-29 | Disposition: A | Discharge status: Home / Self Care | Payer: Medicare HMO | Source: Ambulatory Visit | Attending: Internal Medicine | Admitting: Internal Medicine

## 2022-08-29 DIAGNOSIS — R928 Other abnormal and inconclusive findings on diagnostic imaging of breast: Secondary | ICD-10-CM

## 2022-08-29 DIAGNOSIS — N6489 Other specified disorders of breast: Secondary | ICD-10-CM | POA: Diagnosis not present

## 2022-08-29 DIAGNOSIS — R922 Inconclusive mammogram: Secondary | ICD-10-CM | POA: Diagnosis not present

## 2022-08-29 NOTE — Patient Instructions (Signed)
[name]  08/29/2022     @PREFPERIOPPHARMACY @   Your procedure is scheduled on  09/05/2022.   Report to [facility] at  1100  A.M.   Call this number if you have problems the morning of surgery:  [phone]  If you experience any cold or flu symptoms such as cough, fever, chills, shortness of breath, etc. between now and your scheduled surgery, please notify us at the above number.   Remember:  Follow the diet and prep instructions given to you by the office.     Take these medicines the morning of surgery with A SIP OF WATER           xanax(if needed), amlodipine, baclofen, flexeril or zanaflex (if needed), cymbalta, levothyroxie, reglan, metoprolol, omeprazole, zofran (if needed), oxycodone (if needed) effexor.     Do not wear jewelry, make-up or nail polish.  Do not wear lotions, powders, or perfumes, or deodorant.  Do not shave 48 hours prior to surgery.  Men may shave face and neck.  Do not bring valuables to the hospital.  [facility] is not responsible for any belongings or valuables.  Contacts, dentures or bridgework may not be worn into surgery.  Leave your suitcase in the car.  After surgery it may be brought to your room.  For patients admitted to the hospital, discharge time will be determined by your treatment team.  Patients discharged the day of surgery will not be allowed to drive home and must have someone with them for 24 hours.    Special instructions:   DO NOT smoke tobacco or vape for 24 hours before your procedure.  Please read over the following fact sheets that you were given. Anesthesia Post-op Instructions and Care and Recovery After Surgery       Colonoscopy, Adult, Care After The following information offers guidance on how to care for yourself after your procedure. Your health care provider may also give you more specific instructions. If you have problems or questions, contact your health care provider. What can I expect  after the procedure? After the procedure, it is common to have: A small amount of blood in your stool for 24 hours after the procedure. Some gas. Mild cramping or bloating of your abdomen. Follow these instructions at home: Eating and drinking  Drink enough fluid to keep your urine pale yellow. Follow instructions from your health care provider about eating or drinking restrictions. Resume your normal diet as told by your health care provider. Avoid heavy or fried foods that are hard to digest. Activity Rest as told by your health care provider. Avoid sitting for a long time without moving. Get up to take short walks every 1-2 hours. This is important to improve blood flow and breathing. Ask for help if you feel weak or unsteady. Return to your normal activities as told by your health care provider. Ask your health care provider what activities are safe for you. Managing cramping and bloating  Try walking around when you have cramps or feel bloated. If directed, apply heat to your abdomen as told by your health care provider. Use the heat source that your health care provider recommends, such as a moist heat pack or a heating pad. Place a towel between your skin and the heat source. Leave the heat on for 20-30 minutes. Remove the heat if your skin turns bright red. This is especially important if you are unable to feel pain, heat, or  cold. You have a greater risk of getting burned. General instructions If you were given a sedative during the procedure, it can affect you for several hours. Do not drive or operate machinery until your health care provider says that it is safe. For the first 24 hours after the procedure: Do not sign important documents. Do not drink alcohol. Do your regular daily activities at a slower pace than normal. Eat soft foods that are easy to digest. Take over-the-counter and prescription medicines only as told by your health care provider. Keep all follow-up  visits. This is important. Contact a health care provider if: You have blood in your stool 2-3 days after the procedure. Get help right away if: You have more than a small spotting of blood in your stool. You have large blood clots in your stool. You have swelling of your abdomen. You have nausea or vomiting. You have a fever. You have increasing pain in your abdomen that is not relieved with medicine. These symptoms may be an emergency. Get help right away. Call 911. Do not wait to see if the symptoms will go away. Do not drive yourself to the hospital. Summary After the procedure, it is common to have a small amount of blood in your stool. You may also have mild cramping and bloating of your abdomen. If you were given a sedative during the procedure, it can affect you for several hours. Do not drive or operate machinery until your health care provider says that it is safe. Get help right away if you have a lot of blood in your stool, nausea or vomiting, a fever, or increased pain in your abdomen. This information is not intended to replace advice given to you by your health care provider. Make sure you discuss any questions you have with your health care provider. Document Revised: 01/11/2021 Document Reviewed: 01/11/2021 Elsevier Patient Education  [place] After The following information offers guidance on how to care for yourself after your procedure. Your health care provider may also give you more specific instructions. If you have problems or questions, contact your health care provider. What can I expect after the procedure? After the procedure, it is common to have: Tiredness. Little or no memory about what happened during or after the procedure. Impaired judgment when it comes to making decisions. Nausea or vomiting. Some trouble with balance. Follow these instructions at home: For the time period you were told by your health care  provider:  Rest. Do not participate in activities where you could fall or become injured. Do not drive or use machinery. Do not drink alcohol. Do not take sleeping pills or medicines that cause drowsiness. Do not make important decisions or sign legal documents. Do not take care of children on your own. Medicines Take over-the-counter and prescription medicines only as told by your health care provider. If you were prescribed antibiotics, take them as told by your health care provider. Do not stop using the antibiotic even if you start to feel better. Eating and drinking Follow instructions from your health care provider about what you may eat and drink. Drink enough fluid to keep your urine pale yellow. If you vomit: Drink clear fluids slowly and in small amounts as you are able. Clear fluids include water, ice chips, low-calorie sports drinks, and fruit juice that has water added to it (diluted fruit juice). Eat light and bland foods in small amounts as you are able. These foods include bananas,  applesauce, rice, lean meats, toast, and crackers. General instructions  Have a responsible adult stay with you for the time you are told. It is important to have someone help care for you until you are awake and alert. If you have sleep apnea, surgery and some medicines can increase your risk for breathing problems. Follow instructions from your health care provider about wearing your sleep device: When you are sleeping. This includes during daytime naps. While taking prescription pain medicines, sleeping medicines, or medicines that make you drowsy. Do not use any products that contain nicotine or tobacco. These products include cigarettes, chewing tobacco, and vaping devices, such as e-cigarettes. If you need help quitting, ask your health care provider. Contact a health care provider if: You feel nauseous or vomit every time you eat or drink. You feel light-headed. You are still sleepy or  having trouble with balance after 24 hours. You get a rash. You have a fever. You have redness or swelling around the IV site. Get help right away if: You have trouble breathing. You have new confusion after you get home. These symptoms may be an emergency. Get help right away. Call 911. Do not wait to see if the symptoms will go away. Do not drive yourself to the hospital. This information is not intended to replace advice given to you by your health care provider. Make sure you discuss any questions you have with your health care provider. Document Revised: 10/16/2021 Document Reviewed: 10/16/2021 Elsevier Patient Education  [place].

## 2022-09-03 ENCOUNTER — Encounter (HOSPITAL_COMMUNITY)
Admission: RE | Admit: 2022-09-03 | Discharge: 2022-09-03 | Disposition: A | Discharge status: Home / Self Care | Payer: Medicare HMO | Source: Ambulatory Visit | Attending: Internal Medicine | Admitting: Internal Medicine

## 2022-09-03 ENCOUNTER — Encounter (HOSPITAL_COMMUNITY): Payer: Self-pay

## 2022-09-03 VITALS — BP 127/68 | HR 81 | Temp 98.3°F | Resp 18 | Ht 66.0 in | Wt 208.8 lb

## 2022-09-03 DIAGNOSIS — K7460 Unspecified cirrhosis of liver: Secondary | ICD-10-CM | POA: Diagnosis not present

## 2022-09-03 DIAGNOSIS — Z01818 Encounter for other preprocedural examination: Secondary | ICD-10-CM | POA: Diagnosis not present

## 2022-09-03 HISTORY — DX: Portal hypertension: K76.6

## 2022-09-03 LAB — COMPREHENSIVE METABOLIC PANEL
ALT: 19 U/L (ref 0–44)
AST: 34 U/L (ref 15–41)
Albumin: 3.4 g/dL — ABNORMAL LOW (ref 3.5–5.0)
Alkaline Phosphatase: 245 U/L — ABNORMAL HIGH (ref 38–126)
Anion gap: 10 (ref 5–15)
BUN: 18 mg/dL (ref 6–20)
CO2: 25 mmol/L (ref 22–32)
Calcium: 8.7 mg/dL — ABNORMAL LOW (ref 8.9–10.3)
Chloride: 99 mmol/L (ref 98–111)
Creatinine, Ser: 1.37 mg/dL — ABNORMAL HIGH (ref 0.44–1.00)
GFR, Estimated: 44 mL/min — ABNORMAL LOW (ref >60)
Glucose, Bld: 87 mg/dL (ref 70–99)
Potassium: 4 mmol/L (ref 3.5–5.1)
Sodium: 134 mmol/L — ABNORMAL LOW (ref 135–145)
Total Bilirubin: 0.6 mg/dL (ref 0.3–1.2)
Total Protein: 6.5 g/dL (ref 6.5–8.1)

## 2022-09-03 LAB — CBC WITH DIFFERENTIAL/PLATELET
Abs Immature Granulocytes: 0.02 10*3/uL (ref 0.00–0.07)
Basophils Absolute: 0.1 10*3/uL (ref 0.0–0.1)
Basophils Relative: 1 %
Eosinophils Absolute: 0.4 10*3/uL (ref 0.0–0.5)
Eosinophils Relative: 5 %
HCT: 30.3 % — ABNORMAL LOW (ref 36.0–46.0)
Hemoglobin: 10 g/dL — ABNORMAL LOW (ref 12.0–15.0)
Immature Granulocytes: 0 %
Lymphocytes Relative: 38 %
Lymphs Abs: 2.6 10*3/uL (ref 0.7–4.0)
MCH: 29.8 pg (ref 26.0–34.0)
MCHC: 33 g/dL (ref 30.0–36.0)
MCV: 90.2 fL (ref 80.0–100.0)
Monocytes Absolute: 0.9 10*3/uL (ref 0.1–1.0)
Monocytes Relative: 14 %
Neutro Abs: 2.9 10*3/uL (ref 1.7–7.7)
Neutrophils Relative %: 42 %
Platelets: 247 10*3/uL (ref 150–400)
RBC: 3.36 MIL/uL — ABNORMAL LOW (ref 3.87–5.11)
RDW: 13.8 % (ref 11.5–15.5)
WBC: 6.8 10*3/uL (ref 4.0–10.5)
nRBC: 0 % (ref 0.0–0.2)

## 2022-09-03 LAB — PROTIME-INR
INR: 1.1 (ref 0.8–1.2)
Prothrombin Time: 13.8 seconds (ref 11.4–15.2)

## 2022-09-03 NOTE — Progress Notes (Signed)
   09/03/22 1421  OBSTRUCTIVE SLEEP APNEA  Have you ever been diagnosed with sleep apnea through a sleep study? No  Do you snore loudly (loud enough to be heard through closed doors)?  1  Do you often feel tired, fatigued, or sleepy during the daytime (such as falling asleep during driving or talking to someone)? 0  Has anyone observed you stop breathing during your sleep? 1  Do you have, or are you being treated for high blood pressure? 1  BMI more than 35 kg/m2? 1  Age > 50 (1-yes) 1  Neck circumference greater than:Female 16 inches or larger, Female 17inches or larger? 0  Female Gender (Yes=1) 0  Obstructive Sleep Apnea Score 5  Score 5 or greater  Results sent to PCP

## 2022-09-05 ENCOUNTER — Ambulatory Visit (HOSPITAL_COMMUNITY): Payer: Medicare HMO | Admitting: Certified Registered"

## 2022-09-05 ENCOUNTER — Encounter (HOSPITAL_COMMUNITY)
Admission: RE | Disposition: A | Discharge status: Home / Self Care | Payer: Self-pay | Source: Home / Self Care | Attending: Internal Medicine

## 2022-09-05 ENCOUNTER — Ambulatory Visit (HOSPITAL_BASED_OUTPATIENT_CLINIC_OR_DEPARTMENT_OTHER): Payer: Medicare HMO | Admitting: Certified Registered"

## 2022-09-05 ENCOUNTER — Ambulatory Visit (HOSPITAL_COMMUNITY)
Admission: RE | Admit: 2022-09-05 | Discharge: 2022-09-05 | Disposition: A | Discharge status: Home / Self Care | Payer: Medicare HMO | Attending: Internal Medicine | Admitting: Internal Medicine

## 2022-09-05 DIAGNOSIS — K219 Gastro-esophageal reflux disease without esophagitis: Secondary | ICD-10-CM | POA: Diagnosis not present

## 2022-09-05 DIAGNOSIS — E039 Hypothyroidism, unspecified: Secondary | ICD-10-CM | POA: Insufficient documentation

## 2022-09-05 DIAGNOSIS — F17210 Nicotine dependence, cigarettes, uncomplicated: Secondary | ICD-10-CM | POA: Insufficient documentation

## 2022-09-05 DIAGNOSIS — J449 Chronic obstructive pulmonary disease, unspecified: Secondary | ICD-10-CM | POA: Insufficient documentation

## 2022-09-05 DIAGNOSIS — Z79899 Other long term (current) drug therapy: Secondary | ICD-10-CM | POA: Insufficient documentation

## 2022-09-05 DIAGNOSIS — I10 Essential (primary) hypertension: Secondary | ICD-10-CM | POA: Diagnosis not present

## 2022-09-05 DIAGNOSIS — Z800 Family history of malignant neoplasm of digestive organs: Secondary | ICD-10-CM | POA: Diagnosis not present

## 2022-09-05 DIAGNOSIS — K625 Hemorrhage of anus and rectum: Secondary | ICD-10-CM | POA: Diagnosis not present

## 2022-09-05 DIAGNOSIS — Z86010 Personal history of colonic polyps: Secondary | ICD-10-CM | POA: Insufficient documentation

## 2022-09-05 DIAGNOSIS — Z1211 Encounter for screening for malignant neoplasm of colon: Secondary | ICD-10-CM | POA: Insufficient documentation

## 2022-09-05 DIAGNOSIS — Z8379 Family history of other diseases of the digestive system: Secondary | ICD-10-CM | POA: Insufficient documentation

## 2022-09-05 DIAGNOSIS — R69 Illness, unspecified: Secondary | ICD-10-CM | POA: Diagnosis not present

## 2022-09-05 HISTORY — PX: FLEXIBLE SIGMOIDOSCOPY: SHX5431

## 2022-09-05 SURGERY — SIGMOIDOSCOPY, FLEXIBLE
Anesthesia: General

## 2022-09-05 MED ORDER — PROPOFOL 10 MG/ML IV BOLUS
INTRAVENOUS | Status: DC | PRN
  Administered 2022-09-05: 50 mg via INTRAVENOUS
  Administered 2022-09-05 (×2): 20 mg via INTRAVENOUS

## 2022-09-05 MED ORDER — LACTATED RINGERS IV SOLN
INTRAVENOUS | Status: DC
Start: 2022-09-05 — End: 2022-09-05

## 2022-09-05 MED ORDER — PROPOFOL 500 MG/50ML IV EMUL
INTRAVENOUS | Status: DC | PRN
  Administered 2022-09-05: 150 ug/kg/min via INTRAVENOUS

## 2022-09-05 MED ORDER — LACTATED RINGERS IV SOLN: INTRAVENOUS | Status: DC | PRN

## 2022-09-05 NOTE — Discharge Instructions (Addendum)
   sigmoidoscopy Discharge Instructions  Read the instructions outlined below and refer to this sheet in the next few weeks. These discharge instructions provide you with general information on caring for yourself after you leave the hospital. Your doctor may also give you specific instructions. While your treatment has been planned according to the most current medical practices available, unavoidable complications occasionally occur. If you have any problems or questions after discharge, call Dr. Gala Romney at [phone]. ACTIVITY You may resume your regular activity, but move at a slower pace for the next 24 hours.  Take frequent rest periods for the next 24 hours.  Walking will help get rid of the air and reduce the bloated feeling in your belly (abdomen).  No driving for 24 hours (because of the medicine (anesthesia) used during the test).   Do not sign any important legal documents or operate any machinery for 24 hours (because of the anesthesia used during the test).  NUTRITION Drink plenty of fluids.  You may resume your normal diet as instructed by your doctor.  Begin with a light meal and progress to your normal diet. Heavy or fried foods are harder to digest and may make you feel sick to your stomach (nauseated).  Avoid alcoholic beverages for 24 hours or as instructed.  MEDICATIONS You may resume your normal medications unless your doctor tells you otherwise.  WHAT YOU CAN EXPECT TODAY Some feelings of bloating in the abdomen.  Passage of more gas than usual.  Spotting of blood in your stool or on the toilet paper.  IF YOU HAD POLYPS REMOVED DURING THE COLONOSCOPY: No aspirin products for 7 days or as instructed.  No alcohol for 7 days or as instructed.  Eat a soft diet for the next 24 hours.  FINDING OUT THE RESULTS OF YOUR TEST Not all test results are available during your visit. If your test results are not back during the visit, make an appointment with your caregiver to find out  the results. Do not assume everything is normal if you have not heard from your caregiver or the medical facility. It is important for you to follow up on all of your test results.  SEEK IMMEDIATE MEDICAL ATTENTION IF: You have more than a spotting of blood in your stool.  Your belly is swollen (abdominal distention).  You are nauseated or vomiting.  You have a temperature over 101.  You have abdominal pain or discomfort that is severe or gets worse throughout the day.     unfortunately your colon was not prepped for colonoscopy today.    We will plan to see you back in the office in 6 weeks and go from there  At patient request, I called Lynnette at [facility] at [phone] -  call rolled to voicemail.  "Voicemail full"

## 2022-09-05 NOTE — Transfer of Care (Signed)
Immediate Anesthesia Transfer of Care Note  Patient: [name]  Procedure(s) Performed: FLEXIBLE SIGMOIDOSCOPY  Patient Location: PACU  Anesthesia Type:General  Level of Consciousness: awake, alert , oriented, and patient cooperative  Airway & Oxygen Therapy: Patient Spontanous Breathing  Post-op Assessment: Report given to RN, Post -op Vital signs reviewed and stable, and Patient moving all extremities X 4  Post vital signs: Reviewed and stable  Last Vitals:  Vitals Value Taken Time  BP 96/45 09/05/22 1153  Temp 36.7 C 09/05/22 1153  Pulse 65 09/05/22 1153  Resp 13 09/05/22 1153  SpO2 95 % 09/05/22 1153    Last Pain:  Vitals:   09/05/22 1153  TempSrc: Axillary  PainSc: 10-Worst pain ever      Patients Stated Pain Goal: 8 ([id number])  Complications: No notable events documented.

## 2022-09-05 NOTE — Anesthesia Preprocedure Evaluation (Signed)
Anesthesia Evaluation  Patient identified by MRN, date of birth, ID band Patient awake    Reviewed: Allergy & Precautions, H&P , NPO status , Patient's Chart, lab work & pertinent test results, reviewed documented beta blocker date and time   Airway Mallampati: II  TM Distance: >3 FB Neck ROM: full    Dental no notable dental hx.    Pulmonary shortness of breath, asthma , COPD,  COPD inhaler, Current Smoker and Patient abstained from smoking.   Pulmonary exam normal breath sounds clear to auscultation       Cardiovascular Exercise Tolerance: Good hypertension, negative cardio ROS  Rhythm:regular Rate:Normal     Neuro/Psych  Headaches PSYCHIATRIC DISORDERS Anxiety Depression     Neuromuscular disease    GI/Hepatic Neg liver ROS,GERD  ,,  Endo/Other  Hypothyroidism    Renal/GU negative Renal ROS  negative genitourinary   Musculoskeletal   Abdominal   Peds  Hematology  (+) Blood dyscrasia, anemia   Anesthesia Other Findings   Reproductive/Obstetrics negative OB ROS                             Anesthesia Physical Anesthesia Plan  ASA: 3  Anesthesia Plan: General   Post-op Pain Management:    Induction:   PONV Risk Score and Plan: Propofol infusion  Airway Management Planned:   Additional Equipment:   Intra-op Plan:   Post-operative Plan:   Informed Consent: I have reviewed the patients History and Physical, chart, labs and discussed the procedure including the risks, benefits and alternatives for the proposed anesthesia with the patient or authorized representative who has indicated his/her understanding and acceptance.     Dental Advisory Given  Plan Discussed with: CRNA  Anesthesia Plan Comments:        Anesthesia Quick Evaluation

## 2022-09-05 NOTE — Interval H&P Note (Signed)
History and Physical Interval Note:  09/05/2022 11:30 AM  [name]  has presented today for surgery, with the diagnosis of rectal bleeding, hx colon polyps.  The various methods of treatment have been discussed with the patient and family. After consideration of risks, benefits and other options for treatment, the patient has consented to  Procedure(s) with comments: COLONOSCOPY WITH PROPOFOL (N/A) - 100pm, asa 3 as a surgical intervention.  The patient's history has been reviewed, patient examined, no change in status, stable for surgery.  I have reviewed the patient's chart and labs.  Questions were answered to the patient's satisfaction.     [name]   no change.  Patient states she should be very well cleaned out she followed her prep instructions;  for surveillance colonoscopy today. The risks, benefits, limitations, alternatives and imponderables have been reviewed with the patient. Questions have been answered. All parties are agreeable.

## 2022-09-05 NOTE — Op Note (Signed)
[facility] Patient Name: [name] Procedure Date: 09/05/2022 11:30 AM MRN: [id number] Date of Birth: Apr 22, 1961 Attending MD: Norvel Richards , MD, [id number] CSN: [id number] Age: 62 Admit Type: Outpatient Procedure:                Colonoscopy Indications:              High risk colon cancer surveillance: Personal                            history of colonic polyps Providers:                Norvel Richards, MD, Caprice Kluver, Raphael Gibney, Technician Referring MD:              Medicines:                Propofol per Anesthesia Complications:            No immediate complications. Estimated Blood Loss:     Estimated blood loss: none. Procedure:                Pre-Anesthesia Assessment:                           - Prior to the procedure, a History and Physical                            was performed, and patient medications and                            allergies were reviewed. The patient's tolerance of                            previous anesthesia was also reviewed. The risks                            and benefits of the procedure and the sedation                            options and risks were discussed with the patient.                            All questions were answered, and informed consent                            was obtained. Prior Anticoagulants: The patient has                            taken no anticoagulant or antiplatelet agents. ASA                            Grade Assessment: III - A patient with severe  systemic disease. After reviewing the risks and                            benefits, the patient was deemed in satisfactory                            condition to undergo the procedure.                           After obtaining informed consent, the colonoscope                            was passed under direct vision. Throughout the                            procedure, the patient's  blood pressure, pulse, and                            oxygen saturations were monitored continuously. The                            [id number]) scope was introduced through the                            anus and advanced to the the sigmoid colon. The                            colonoscopy was performed without difficulty. The                            patient tolerated the procedure well. The quality                            of the bowel preparation was inadequate. The rectum                            was photographed. Scope In: [id number] AM Scope Out: 11:48:34 AM Total Procedure Duration: 0 hours 2 minutes 25 seconds  Findings:      The perianal and digital rectal examinations were normal. Endoscopic       findings. Large amount of formed stool in the rectum partially occluding       the lumen all the way up into the mid sigmoid. It appeared all the way       up. Prep grossly inadequate. Procedure terminated. Impression:               - Preparation of the colon was inadequate.                           - No specimens collected. Moderate Sedation:      Moderate (conscious) sedation was personally administered by an       anesthesia professional. The following parameters were monitored: oxygen       saturation, heart rate, blood pressure, respiratory rate, EKG, adequacy       of pulmonary ventilation, and response to care. Recommendation:           -  Patient has a contact number available for                            emergencies. The signs and symptoms of potential                            delayed complications were discussed with the                            patient. Return to normal activities tomorrow.                            Written discharge instructions were provided to the                            patient.                           - Advance diet as tolerated.                           - Continue present medications. Return to the                             office in 6 weeks for cirrhosis care. At that time,                            we will reassess her candidacy for colonoscopy.                           - Return to my office in 6 weeks. Procedure Code(s):        --- Professional ---                           [id number], 88, Colonoscopy, flexible; diagnostic,                            including collection of specimen(s) by brushing or                            washing, when performed (separate procedure) Diagnosis Code(s):        --- Professional ---                           Z86.010, Personal history of colonic polyps CPT copyright 2022 American Medical Association. All rights reserved. The codes documented in this report are preliminary and upon coder review may  be revised to meet current compliance requirements. [name]. Murray Guzzetta, MD Norvel Richards, MD 09/05/2022 11:57:48 AM This report has been signed electronically. Number of Addenda: 0

## 2022-09-07 ENCOUNTER — Ambulatory Visit (HOSPITAL_COMMUNITY)
Admission: RE | Admit: 2022-09-07 | Discharge: 2022-09-07 | Disposition: A | Discharge status: Home / Self Care | Payer: Medicare HMO | Source: Ambulatory Visit | Attending: Neurosurgery | Admitting: Neurosurgery

## 2022-09-07 DIAGNOSIS — R200 Anesthesia of skin: Secondary | ICD-10-CM | POA: Diagnosis not present

## 2022-09-07 DIAGNOSIS — D171 Benign lipomatous neoplasm of skin and subcutaneous tissue of trunk: Secondary | ICD-10-CM | POA: Diagnosis not present

## 2022-09-07 DIAGNOSIS — M545 Low back pain, unspecified: Secondary | ICD-10-CM | POA: Diagnosis not present

## 2022-09-07 DIAGNOSIS — M4316 Spondylolisthesis, lumbar region: Secondary | ICD-10-CM | POA: Diagnosis not present

## 2022-09-07 MED ORDER — GADOBUTROL 1 MMOL/ML IV SOLN
10.0000 mL | Freq: Once | INTRAVENOUS | Status: AC | PRN
  Administered 2022-09-07: 10 mL via INTRAVENOUS

## 2022-09-07 NOTE — Anesthesia Postprocedure Evaluation (Signed)
Anesthesia Post Note  Patient: KORINNA BUMGARDNER  Procedure(s) Performed: FLEXIBLE SIGMOIDOSCOPY  Patient location during evaluation: Phase II Anesthesia Type: General Level of consciousness: awake Pain management: pain level controlled Vital Signs Assessment: post-procedure vital signs reviewed and stable Respiratory status: spontaneous breathing and respiratory function stable Cardiovascular status: blood pressure returned to baseline and stable Postop Assessment: no headache and no apparent nausea or vomiting Anesthetic complications: no Comments: Late entry   No notable events documented.   Last Vitals:  Vitals:   09/05/22 1116 09/05/22 1153  BP: 116/64 (!) 96/45  Pulse: 72 65  Resp: 18 13  Temp: 36.9 C 36.7 C  SpO2: 97% 95%    Last Pain:  Vitals:   09/06/22 1259  TempSrc:   PainSc: 0-No pain                 Windell Norfolk

## 2022-09-10 DIAGNOSIS — Z6834 Body mass index (BMI) 34.0-34.9, adult: Secondary | ICD-10-CM | POA: Diagnosis not present

## 2022-09-10 DIAGNOSIS — M4317 Spondylolisthesis, lumbosacral region: Secondary | ICD-10-CM | POA: Diagnosis not present

## 2022-09-11 ENCOUNTER — Encounter (HOSPITAL_COMMUNITY): Payer: Self-pay | Admitting: Internal Medicine

## 2022-09-12 ENCOUNTER — Telehealth: Payer: Self-pay

## 2022-09-12 NOTE — Telephone Encounter (Signed)
Pt called stating that she was unable to have her colonoscopy completed due to inadequate prep. Pt states that she sleep walks and apparently ate while sleep walking. Pt is wanting to reschedule colonoscopy and states that her daughter will be there to make sure that she doesn't eat/sleep walk at that time. Please advise what the pt needs to do to have it rescheduled.

## 2022-09-15 NOTE — Telephone Encounter (Signed)
Second failed prep. Given personal history of adenomatous colon polyps, Cologuard may not be best option.   Recommend ov to discuss next step.

## 2022-09-17 NOTE — Telephone Encounter (Signed)
Pt was made aware and scheduled

## 2022-09-25 DIAGNOSIS — E6609 Other obesity due to excess calories: Secondary | ICD-10-CM | POA: Diagnosis not present

## 2022-09-25 DIAGNOSIS — D473 Essential (hemorrhagic) thrombocythemia: Secondary | ICD-10-CM | POA: Diagnosis not present

## 2022-09-25 DIAGNOSIS — M549 Dorsalgia, unspecified: Secondary | ICD-10-CM | POA: Diagnosis not present

## 2022-09-25 DIAGNOSIS — R69 Illness, unspecified: Secondary | ICD-10-CM | POA: Diagnosis not present

## 2022-09-25 DIAGNOSIS — K7469 Other cirrhosis of liver: Secondary | ICD-10-CM | POA: Diagnosis not present

## 2022-09-25 DIAGNOSIS — I700 Atherosclerosis of aorta: Secondary | ICD-10-CM | POA: Diagnosis not present

## 2022-09-25 DIAGNOSIS — E538 Deficiency of other specified B group vitamins: Secondary | ICD-10-CM | POA: Diagnosis not present

## 2022-09-25 DIAGNOSIS — I10 Essential (primary) hypertension: Secondary | ICD-10-CM | POA: Diagnosis not present

## 2022-09-25 DIAGNOSIS — M5136 Other intervertebral disc degeneration, lumbar region: Secondary | ICD-10-CM | POA: Diagnosis not present

## 2022-09-25 DIAGNOSIS — Z6836 Body mass index (BMI) 36.0-36.9, adult: Secondary | ICD-10-CM | POA: Diagnosis not present

## 2022-09-25 DIAGNOSIS — D45 Polycythemia vera: Secondary | ICD-10-CM | POA: Diagnosis not present

## 2022-09-30 NOTE — Progress Notes (Deleted)
GI Office Note    Referring Provider: Elfredia Nevins, MD Primary Care Physician:  Elfredia Nevins, MD  Primary Gastroenterologist: Roetta Sessions, MD   Chief Complaint   No chief complaint on file.   History of Present Illness   [name] is a 62 y.o. female presenting today    She has a history of cirrhosis of unclear etiology.  Required paracentesis in 2018.  History of mildly elevated ASMA in the past but normal in 2020, recently mildly elevated again with titer 1:20.  She is immune to hepatitis B.  ANA previously negative, recently now positive.  AMA negative.  Chronically elevated alkaline phosphatase. Labs back in January showed low fasting a.m. cortisol level, referred to endocrinology for evaluation but she has not seen or heard from them.  MELD sodium of 8. Recent alkaline phosphatase 309, AST 61, ALT 32, total bilirubin 0.6, AFP 3.9. ***discuss with dawn.  Endo***     Two failed colonoscopy preps this year. Patient reports sleep walking and eating without being aware. States he rdaughter will be there next time to make sure she doesn't eat/sleep walk during prep.     Last time: bisacodyl 10mg  daily for 3 days, two full days of clear liquids. Two days before, 7 capfuls of miralax in 32 ounces of water over two hours. Then clenpiq the day before.   First time developed vomiting after 1/3 of trilyte.  MRI Lumbar spine 09/2022; IMPRESSION: 1. The heterogeneous fatty lesion in the left lower erector spinae musculature has enlarged from 2017, but has not significantly changed over the last 7 months. 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 Recommend tissue sampling or excision. 2. No acute findings or significant change from most recent prior MRI of 7 months ago. 3. Stable left foraminal disc extrusion at L4-5 with resulting left L4  nerve root encroachment. 4. Stable postsurgical changes on the left at L5-S1.  Abdominal ultrasound 05/23/2022: IMPRESSION: 1. No acute abnormality identified. 2. Nodular contour of the liver, consistent with cirrhosis. There is probable recanalized umbilical vein.   EGD January 2024:  -Gastric mucosal changes consistent with portal hypertensive gastropathy -Gastric biopsy showed mild nonspecific reactive gastropathy, gastric oxyntic mucosa with parietal cell hyperplasia as can be seen in hypergastrinemia state such as PPI therapy.  No H. pylori. Medications   Current Outpatient Medications  Medication Sig Dispense Refill   ALPRAZolam (XANAX) 1 MG tablet Take 1 mg by mouth 3 (three) times daily as needed for anxiety.     amLODipine (NORVASC) 5 MG tablet Take 1 tablet (5 mg total) by mouth daily. (Patient taking differently: Take 10 mg by mouth daily.) 30 tablet 3   baclofen (LIORESAL) 10 MG tablet Take 10 mg by mouth 3 (three) times daily.     cyclobenzaprine (FLEXERIL) 10 MG tablet Take 10 mg by mouth 3 (three) times daily.     DULoxetine (CYMBALTA) 30 MG capsule Take 30 mg by mouth daily.     DULoxetine (CYMBALTA) 60 MG capsule Take 60 mg by mouth at bedtime.     estradiol (ESTRACE) 1 MG tablet Take 1 mg by mouth daily.     levothyroxine (SYNTHROID, LEVOTHROID) 25 MCG tablet Take 25 mcg by mouth daily.     metoCLOPramide (REGLAN) 5 MG tablet Take 5 mg by mouth 4 (four) times daily.     metoprolol tartrate (LOPRESSOR) 25 MG tablet Take 1 tablet (25  mg total) by mouth 2 (two) times daily. 60 tablet 2   omeprazole (PRILOSEC) 20 MG capsule Take 20 mg by mouth at bedtime.      ondansetron (ZOFRAN-ODT) 4 MG disintegrating tablet Take 4-8 mg by mouth every 8 (eight) hours as needed.     oxycodone (ROXICODONE) 30 MG immediate release tablet Take 30 mg by mouth 4 (four) times daily as needed.     promethazine (PHENERGAN) 50 MG tablet Take 50 mg by mouth 2 (two) times daily as needed.      tiZANidine (ZANAFLEX) 4 MG tablet Take 4 mg by mouth every 6 (six) hours as needed.     venlafaxine XR (EFFEXOR-XR) 75 MG 24 hr capsule Take 75 mg by mouth daily.     zolpidem (AMBIEN) 10 MG tablet Take 10 mg by mouth at bedtime.       No current facility-administered medications for this visit.    Allergies   Allergies as of 10/01/2022 - Review Complete 09/05/2022  Allergen Reaction Noted   Codeine Hives and Other (See Comments) 03/03/2008   Latex Hives and Itching 02/23/2011   Lyrica [pregabalin] Hives 11/20/2016   Nsaids  11/20/2016   Other Hives and Itching 02/23/2013     Past Medical History   Past Medical History:  Diagnosis Date   Anemia    Anxiety    Asthma    Cirrhosis (HCC)    Cirrhosis (HCC)    COPD (chronic obstructive pulmonary disease) (HCC)    Depression    Disc degeneration, lumbar    GERD (gastroesophageal reflux disease)    Hypertension    Leukocytosis    Lumbar disc disease    Portal hypertension (HCC)    Shortness of breath    Thrombocytosis     Past Surgical History   Past Surgical History:  Procedure Laterality Date   ABDOMINAL HYSTERECTOMY     BACK SURGERY     BIOPSY  03/25/2014   Procedure: GASTRIC BIOPSIES;  Surgeon: Corbin Ade, MD;  Location: [facility];  Service: Endoscopy;;   COLONOSCOPY  02/21/2007   RJJ:OACZ papilla and internal hemorrhoids, diminutive rectal polyp/Left-sided diverticula (sigmoid) pedunculated polyps mid descending   COLONOSCOPY  06/04/2006   Dr. Jena Gauss: segmental biopsy negative for microscopic colitis. Three polyps, one tubular adenoma. Surveillance due 2013.    COLONOSCOPY WITH PROPOFOL N/A 03/25/2014   RMR: Rectal and colonic polyps removed as described above.  Colonic diverticulosis. CT abnormality likely artifactual in orgin     COLONOSCOPY WITH PROPOFOL N/A 07/04/2022   Procedure: COLONOSCOPY WITH PROPOFOL;  Surgeon: Corbin Ade, MD;  Location: [facility];  Service: Endoscopy;  Laterality: N/A;  10:15 am    ESOPHAGOGASTRODUODENOSCOPY  08/08/2005   YSA:YTKZSW esophagogastroduodenoscopy.   ESOPHAGOGASTRODUODENOSCOPY  06/04/2009   Dr. Jena Gauss: normal esophagus, small hiatal hernia, duodenal diverticulum, negative H.pylori and celiac   ESOPHAGOGASTRODUODENOSCOPY (EGD) WITH PROPOFOL N/A 03/25/2014   RMR: Probable occult cervical esophageal web- status post dilation as described above. Abnormal gastic mucosa of uncertain significance-status post gasrtric biopsy   ESOPHAGOGASTRODUODENOSCOPY (EGD) WITH PROPOFOL N/A 07/04/2022   Procedure: ESOPHAGOGASTRODUODENOSCOPY (EGD) WITH PROPOFOL;  Surgeon: Corbin Ade, MD;  Location: [facility];  Service: Endoscopy;  Laterality: N/A;   FLEXIBLE SIGMOIDOSCOPY N/A 09/05/2022   Procedure: FLEXIBLE SIGMOIDOSCOPY;  Surgeon: Corbin Ade, MD;  Location: [facility];  Service: Endoscopy;  Laterality: N/A;   MALONEY DILATION N/A 03/25/2014   Procedure: Elease Hashimoto ESOPHAGEAL DILATION #54 Fr;  Surgeon: Corbin Ade, MD;  Location: [facility];  Service: Endoscopy;  Laterality: N/A;   POLYPECTOMY  03/25/2014   Procedure: SIGMOID AND RECTAL POLYPECTOMY;  Surgeon: Corbin Ade, MD;  Location: [facility];  Service: Endoscopy;;   right knee surgery     rt breast biopsy     TOTAL ABDOMINAL HYSTERECTOMY W/ BILATERAL SALPINGOOPHORECTOMY     age 81    Past Family History   Family History  Adopted: Yes  Problem Relation Age of Onset   Pancreatic cancer Mother    Leukemia Sister        1/2 sister   Hepatitis C Brother    Liver cancer Brother        suicide   Colon cancer Brother        45   Cirrhosis Brother        etoh use   Pancreatic cancer Paternal Grandmother    Kidney cancer Paternal Grandfather    Pancreatic cancer Maternal Aunt    Kidney cancer Maternal Uncle    Pancreatic cancer Maternal Uncle    Colon cancer Paternal Aunt    Breast cancer Neg Hx     Past Social History   Social History   Socioeconomic History   Marital status: Single    Spouse  name: Not on file   Number of children: Not on file   Years of education: Not on file   Highest education level: Not on file  Occupational History   Not on file  Tobacco Use   Smoking status: Every Day    Packs/day: 0.50    Years: 30.00    Additional pack years: 0.00    Total pack years: 15.00    Types: Cigarettes   Smokeless tobacco: Never  Vaping Use   Vaping Use: Never used  Substance and Sexual Activity   Alcohol use: No    Comment: see HPI. Patient denies alcohol use at present/past.   Drug use: No   Sexual activity: Not Currently  Other Topics Concern   Not on file  Social History Narrative   Not on file   Social Determinants of Health   Financial Resource Strain: Not on file  Food Insecurity: Not on file  Transportation Needs: Not on file  Physical Activity: Not on file  Stress: Not on file  Social Connections: Not on file  Intimate Partner Violence: Not on file    Review of Systems   General: Negative for anorexia, weight loss, fever, chills, fatigue, weakness. ENT: Negative for hoarseness, difficulty swallowing , nasal congestion. CV: Negative for chest pain, angina, palpitations, dyspnea on exertion, peripheral edema.  Respiratory: Negative for dyspnea at rest, dyspnea on exertion, cough, sputum, wheezing.  GI: See history of present illness. GU:  Negative for dysuria, hematuria, urinary incontinence, urinary frequency, nocturnal urination.  Endo: Negative for unusual weight change.     Physical Exam   There were no vitals taken for this visit.   General: Well-nourished, well-developed in no acute distress.  Eyes: No icterus. Mouth: Oropharyngeal mucosa moist and pink , no lesions erythema or exudate. Lungs: Clear to auscultation bilaterally.  Heart: Regular rate and rhythm, no murmurs rubs or gallops.  Abdomen: Bowel sounds are normal, nontender, nondistended, no hepatosplenomegaly or masses,  no abdominal bruits or hernia , no rebound or guarding.   Rectal: ***  Extremities: No lower extremity edema. No clubbing or deformities. Neuro: Alert and oriented x 4   Skin: Warm and dry, no jaundice.   Psych: Alert and cooperative, normal  mood and affect.  Labs   Lab Results  Component Value Date   INR 1.1 09/03/2022   INR 1.1 06/08/2022   INR 1.0 03/07/2021   Lab Results  Component Value Date   CREATININE 1.37 (H) 09/03/2022   BUN 18 09/03/2022   NA 134 (L) 09/03/2022   K 4.0 09/03/2022   CL 99 09/03/2022   CO2 25 09/03/2022   Lab Results  Component Value Date   ALT 19 09/03/2022   AST 34 09/03/2022   ALKPHOS 245 (H) 09/03/2022   BILITOT 0.6 09/03/2022   Lab Results  Component Value Date   WBC 6.8 09/03/2022   HGB 10.0 (L) 09/03/2022   HCT 30.3 (L) 09/03/2022   MCV 90.2 09/03/2022   PLT 247 09/03/2022    Imaging Studies   MR Lumbar Spine W Wo Contrast  Result Date: 09/10/2022 CLINICAL DATA:  Low back pain radiating into both buttocks and legs with numbness in the feet and legs for 6-7 months. Atypical fatty mass within the soft tissues of the lower back on previous MRI. EXAM: MRI LUMBAR SPINE WITHOUT AND WITH CONTRAST TECHNIQUE: Multiplanar and multiecho pulse sequences of the lumbar spine were obtained without and with intravenous contrast. CONTRAST:  10mL GADAVIST GADOBUTROL 1 MMOL/ML IV SOLN COMPARISON:  Lumbar MRI 02/12/2022 and 12/27/2015. CT abdomen and pelvis 07/30/2021. Radiographs 01/09/2022. FINDINGS: Segmentation: Conventional anatomy assumed, with the last open disc space designated L5-S1.Concordant with prior imaging. Alignment: Stable chronic grade 1 degenerative anterolisthesis at L4-5. Vertebrae: No worrisome osseous lesion, acute fracture or pars defect. Moderate facet hypertrophy at L4-5. The visualized sacroiliac joints appear unremarkable. Conus medullaris: Extends to the L1 level and appears normal. No abnormal intradural enhancement. Paraspinal and other soft tissues: Heterogeneous fatty lesion in the  lower left erector spinae musculature is not significantly changed from the most recent MRI of 7 months ago, measuring 2.9 x 1.8 cm transverse on image 29/11 and extending approximately 4.0 cm in length on sagittal image 11/5. This demonstrates heterogeneous T1 and T2 signal with areas of internal enhancement following contrast. No osseous erosion. As noted previously, this has enlarged from 2017. No anterior paraspinal abnormalities. Disc levels: Sagittal images demonstrate no significant disc space findings within the visualized lower thoracic spine. There is mild disc bulging at T12-L1 and L1-2 without resulting spinal stenosis or nerve root encroachment. L2-3: Mild disc bulging and facet hypertrophy. No significant spinal stenosis or nerve root encroachment. L3-4: Maintained disc height with mild disc bulging and mild to moderate facet hypertrophy. Mild periarticular subchondral and soft tissue edema with low level enhancement. No significant spinal stenosis or nerve root encroachment. L4-5: Mild loss of disc height with annular disc bulging and a left foraminal disc extrusion causing left L4 nerve root encroachment, unchanged from the most recent prior study. Moderate to severe facet hypertrophy with associated subchondral and surrounding soft tissue edema and enhancement, similar to previous study. L5-S1: Stable remote postsurgical changes on the left. Stable disc bulging, endplate osteophytes and bilateral facet hypertrophy. No spinal stenosis or nerve root encroachment. IMPRESSION: 1. The heterogeneous fatty lesion in the left lower erector spinae musculature has enlarged from 2017, but has not significantly changed over the last 7 months. 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 Recommend tissue sampling or excision. 2. No acute findings or significant change from most recent prior MRI  of 7 months ago. 3. Stable left foraminal disc extrusion at L4-5  with resulting left L4 nerve root encroachment. 4. Stable postsurgical changes on the left at L5-S1. Electronically Signed   By: Carey Bullocks M.D.   On: 09/10/2022 16:57    Assessment       PLAN   ***   Leanna Battles. Melvyn Neth, MHS, PA-C [facility]

## 2022-10-01 ENCOUNTER — Ambulatory Visit: Payer: Medicare HMO | Admitting: Gastroenterology

## 2022-10-18 DIAGNOSIS — I10 Essential (primary) hypertension: Secondary | ICD-10-CM | POA: Diagnosis not present

## 2022-10-18 DIAGNOSIS — Z9229 Personal history of other drug therapy: Secondary | ICD-10-CM | POA: Diagnosis not present

## 2022-10-18 DIAGNOSIS — M5136 Other intervertebral disc degeneration, lumbar region: Secondary | ICD-10-CM | POA: Diagnosis not present

## 2022-10-18 DIAGNOSIS — R5383 Other fatigue: Secondary | ICD-10-CM | POA: Diagnosis not present

## 2022-10-18 DIAGNOSIS — I700 Atherosclerosis of aorta: Secondary | ICD-10-CM | POA: Diagnosis not present

## 2022-10-18 DIAGNOSIS — K7469 Other cirrhosis of liver: Secondary | ICD-10-CM | POA: Diagnosis not present

## 2022-10-18 DIAGNOSIS — E538 Deficiency of other specified B group vitamins: Secondary | ICD-10-CM | POA: Diagnosis not present

## 2022-10-18 DIAGNOSIS — E6609 Other obesity due to excess calories: Secondary | ICD-10-CM | POA: Diagnosis not present

## 2022-10-18 DIAGNOSIS — E2749 Other adrenocortical insufficiency: Secondary | ICD-10-CM | POA: Diagnosis not present

## 2022-10-18 DIAGNOSIS — D473 Essential (hemorrhagic) thrombocythemia: Secondary | ICD-10-CM | POA: Diagnosis not present

## 2022-10-18 DIAGNOSIS — F419 Anxiety disorder, unspecified: Secondary | ICD-10-CM | POA: Diagnosis not present

## 2022-10-18 DIAGNOSIS — Z6835 Body mass index (BMI) 35.0-35.9, adult: Secondary | ICD-10-CM | POA: Diagnosis not present

## 2022-10-22 DIAGNOSIS — Z6833 Body mass index (BMI) 33.0-33.9, adult: Secondary | ICD-10-CM | POA: Diagnosis not present

## 2022-10-22 DIAGNOSIS — M4316 Spondylolisthesis, lumbar region: Secondary | ICD-10-CM | POA: Diagnosis not present

## 2022-10-22 DIAGNOSIS — D171 Benign lipomatous neoplasm of skin and subcutaneous tissue of trunk: Secondary | ICD-10-CM | POA: Diagnosis not present

## 2022-10-30 ENCOUNTER — Encounter: Payer: Self-pay | Admitting: Gastroenterology

## 2022-11-05 ENCOUNTER — Other Ambulatory Visit: Payer: Self-pay | Admitting: Neurosurgery

## 2022-11-14 ENCOUNTER — Other Ambulatory Visit (HOSPITAL_COMMUNITY): Payer: Medicare HMO

## 2022-11-16 NOTE — Progress Notes (Signed)
Surgical Instructions    Your procedure is scheduled on Friday June 21,2024.  Report to [facility] Main Entrance "A" at 5:30 A.M., then check in with the Admitting office.  Call this number if you have problems the morning of surgery:  [phone]   If you have any questions prior to your surgery date call [phone]: Open Monday-Friday 8am-4pm If you experience any cold or flu symptoms such as cough, fever, chills, shortness of breath, etc. between now and your scheduled surgery, please notify us at the above number     Remember:  Do not eat after midnight the night before your surgery.  You may drink clear liquids until 4:30 the morning of your surgery.   Clear liquids allowed are: Water, Non-Citrus Juices (without pulp), Carbonated Beverages, Clear Tea, Black Coffee ONLY (NO MILK, CREAM OR POWDERED CREAMER of any kind), and Gatorade.    Take these medicines the morning of surgery with A SIP OF WATER:  amLODipine (NORVASC)  baclofen (LIORESAL)  cyclobenzaprine (FLEXERIL)  DULoxetine (CYMBALTA)  estradiol (ESTRACE)  levothyroxine (SYNTHROID)  metoCLOPramide (REGLAN)  metoprolol tartrate (LOPRESSOR)  venlafaxine XR (EFFEXOR-XR)   If needed:  ALPRAZolam (XANAX  ondansetron (ZOFRAN-ODT)  oxycodone (ROXICODONE)  promethazine (PHENERGAN)  tiZANidine (ZANAFLEX)   As of today, STOP taking any Aspirin (unless otherwise instructed by your surgeon) Aleve, Naproxen, Ibuprofen, Motrin, Advil, Goody's, BC's, all herbal medications, fish oil, and all vitamins.  Special instructions:    Oral Hygiene is also important to reduce your risk of infection.  Remember - BRUSH YOUR TEETH THE MORNING OF SURGERY WITH YOUR REGULAR TOOTHPASTE    Pre-operative 5 CHG Bath Instructions   You can play a key role in reducing the risk of infection after surgery. Your skin needs to be as free of germs as possible. You can reduce the number of germs on your skin by washing with CHG (chlorhexidine  gluconate) soap before surgery. CHG is an antiseptic soap that kills germs and continues to kill germs even after washing.   DO NOT use if you have an allergy to chlorhexidine/CHG or antibacterial soaps. If your skin becomes reddened or irritated, stop using the CHG and notify one of our RNs at [phone].   Please shower with the CHG soap starting 4 days before surgery using the following schedule:     Please keep in mind the following:  DO NOT shave, including legs and underarms, starting the day of your first shower.   You may shave your face at any point before/day of surgery.  Place clean sheets on your bed the day you start using CHG soap. Use a clean washcloth (not used since being washed) for each shower. DO NOT sleep with pets once you start using the CHG.   CHG Shower Instructions:  If you choose to wash your hair and private area, wash first with your normal shampoo/soap.  After you use shampoo/soap, rinse your hair and body thoroughly to remove shampoo/soap residue.  Turn the water OFF and apply about 3 tablespoons (45 ml) of CHG soap to a CLEAN washcloth.  Apply CHG soap ONLY FROM YOUR NECK DOWN TO YOUR TOES (washing for 3-5 minutes)  DO NOT use CHG soap on face, private areas, open wounds, or sores.  Pay special attention to the area where your surgery is being performed.  If you are having back surgery, having someone wash your back for you may be helpful. Wait 2 minutes after CHG soap is applied, then you may rinse off  the CHG soap.  Pat dry with a clean towel  Put on clean clothes/pajamas   If you choose to wear lotion, please use ONLY the CHG-compatible lotions on the back of this paper.     Additional instructions for the day of surgery: DO NOT APPLY any lotions, deodorants, cologne, or perfumes.   Put on clean/comfortable clothes.  Brush your teeth.  Ask your nurse before applying any prescription medications to the skin.      CHG Compatible Lotions    Aveeno Moisturizing lotion  Cetaphil Moisturizing Cream  Cetaphil Moisturizing Lotion  Clairol Herbal Essence Moisturizing Lotion, Dry Skin  Clairol Herbal Essence Moisturizing Lotion, Extra Dry Skin  Clairol Herbal Essence Moisturizing Lotion, Normal Skin  Curel Age Defying Therapeutic Moisturizing Lotion with Alpha Hydroxy  Curel Extreme Care Body Lotion  Curel Soothing Hands Moisturizing Hand Lotion  Curel Therapeutic Moisturizing Cream, Fragrance-Free  Curel Therapeutic Moisturizing Lotion, Fragrance-Free  Curel Therapeutic Moisturizing Lotion, Original Formula  Eucerin Daily Replenishing Lotion  Eucerin Dry Skin Therapy Plus Alpha Hydroxy Crme  Eucerin Dry Skin Therapy Plus Alpha Hydroxy Lotion  Eucerin Original Crme  Eucerin Original Lotion  Eucerin Plus Crme Eucerin Plus Lotion  Eucerin TriLipid Replenishing Lotion  Keri Anti-Bacterial Hand Lotion  Keri Deep Conditioning Original Lotion Dry Skin Formula Softly Scented  Keri Deep Conditioning Original Lotion, Fragrance Free Sensitive Skin Formula  Keri Lotion Fast Absorbing Fragrance Free Sensitive Skin Formula  Keri Lotion Fast Absorbing Softly Scented Dry Skin Formula  Keri Original Lotion  Keri Skin Renewal Lotion Keri Silky Smooth Lotion  Keri Silky Smooth Sensitive Skin Lotion  Nivea Body Creamy Conditioning Oil  Nivea Body Extra Enriched Lotion  Nivea Body Original Lotion  Nivea Body Sheer Moisturizing Lotion Nivea Crme  Nivea Skin Firming Lotion  NutraDerm 30 Skin Lotion  NutraDerm Skin Lotion  NutraDerm Therapeutic Skin Cream  NutraDerm Therapeutic Skin Lotion  ProShield Protective Hand Cream  Provon moisturizing lotion   Day of Surgery:  Take a shower with CHG soap. Wear Clean/Comfortable clothing the morning of surgery Do not apply any deodorants/lotions.   Remember to brush your teeth WITH YOUR REGULAR TOOTHPASTE.  Do not wear jewelry or makeup. Do not wear lotions, powders, perfumes/cologne or  deodorant. Do not shave 48 hours prior to surgery.  Men may shave face and neck. Do not bring valuables to the hospital. Do not wear nail polish, gel polish, artificial nails, or any other type of covering on natural nails (fingers and toes) If you have artificial nails or gel coating that need to be removed by a nail salon, please have this removed prior to surgery. Artificial nails or gel coating may interfere with anesthesia's ability to adequately monitor your vital signs.  [facility] is not responsible for any belongings or valuables.    Do NOT Smoke (Tobacco/Vaping)  24 hours prior to your procedure  If you use a CPAP at night, you may bring your mask for your overnight stay.   Contacts, glasses, hearing aids, dentures or partials may not be worn into surgery, please bring cases for these belongings   For patients admitted to the hospital, discharge time will be determined by your treatment team.   Patients discharged the day of surgery will not be allowed to drive home, and someone needs to stay with them for 24 hours.   SURGICAL WAITING ROOM VISITATION Patients having surgery or a procedure may have no more than 2 support people in the waiting area - these visitors  may rotate.   Children under the age of 30 must have an adult with them who is not the patient. If the patient needs to stay at the hospital during part of their recovery, the visitor guidelines for inpatient rooms apply. Pre-op nurse will coordinate an appropriate time for 1 support person to accompany patient in pre-op.  This support person may not rotate.   Please refer to [email or web address] for the visitor guidelines for Inpatients (after your surgery is over and you are in a regular room).  If you received a COVID test during your pre-op visit, it is requested that you wear a mask when out in public, stay away from anyone that may not be feeling well, and notify  your surgeon if you develop symptoms. If you have been in contact with anyone that has tested positive in the last 10 days, please notify your surgeon.    Please read over the following fact sheets that you were given.

## 2022-11-19 ENCOUNTER — Inpatient Hospital Stay (HOSPITAL_COMMUNITY)
Admission: RE | Admit: 2022-11-19 | Discharge: 2022-11-19 | Disposition: A | Discharge status: Home / Self Care | Payer: Medicare HMO | Source: Ambulatory Visit

## 2022-11-19 NOTE — Progress Notes (Signed)
Patient did not show for PAT appointment.Called patient, but no answer and voicemail was full. Will attempt call later.

## 2022-11-20 ENCOUNTER — Encounter (HOSPITAL_COMMUNITY): Payer: Self-pay | Admitting: Neurosurgery

## 2022-11-20 ENCOUNTER — Other Ambulatory Visit: Payer: Self-pay

## 2022-11-22 NOTE — Anesthesia Preprocedure Evaluation (Addendum)
Anesthesia Evaluation  Patient identified by MRN, date of birth, ID band Patient awake    Reviewed: Allergy & Precautions, NPO status , Patient's Chart, lab work & pertinent test results, reviewed documented beta blocker date and time   History of Anesthesia Complications Negative for: history of anesthetic complications  Airway Mallampati: I  TM Distance: >3 FB Neck ROM: Full    Dental  (+) Edentulous Upper, Missing, Dental Advisory Given   Pulmonary COPD,  COPD inhaler, Current SmokerPatient did not abstain from smoking.   breath sounds clear to auscultation       Cardiovascular hypertension, Pt. on medications and Pt. on home beta blockers (-) angina  Rhythm:Regular Rate:Normal  '22 ECHO: EF 65-70%, normal LVF, severe LVH, normal RVF, no significant valvular abnormalities   Neuro/Psych  Headaches  Anxiety Depression       GI/Hepatic ,GERD  Medicated and Controlled,,(+) Cirrhosis         Endo/Other  Hypothyroidism  BMI 31  Renal/GU negative Renal ROS     Musculoskeletal  (+) Arthritis ,    Abdominal   Peds  Hematology negative hematology ROS (+) Hb 12.0, plt 345k   Anesthesia Other Findings   Reproductive/Obstetrics                              Anesthesia Physical Anesthesia Plan  ASA: 3  Anesthesia Plan: General   Post-op Pain Management: Minimal or no pain anticipated   Induction: Intravenous  PONV Risk Score and Plan: 2 and Ondansetron and Dexamethasone  Airway Management Planned: Oral ETT  Additional Equipment: None  Intra-op Plan:   Post-operative Plan: Extubation in OR  Informed Consent: I have reviewed the patients History and Physical, chart, labs and discussed the procedure including the risks, benefits and alternatives for the proposed anesthesia with the patient or authorized representative who has indicated his/her understanding and acceptance.     Dental  advisory given  Plan Discussed with: CRNA and Surgeon  Anesthesia Plan Comments:          Anesthesia Quick Evaluation

## 2022-11-23 ENCOUNTER — Other Ambulatory Visit: Payer: Self-pay

## 2022-11-23 ENCOUNTER — Encounter (HOSPITAL_COMMUNITY): Payer: Self-pay | Admitting: Neurosurgery

## 2022-11-23 ENCOUNTER — Ambulatory Visit (HOSPITAL_BASED_OUTPATIENT_CLINIC_OR_DEPARTMENT_OTHER): Payer: Medicare HMO | Admitting: Registered Nurse

## 2022-11-23 ENCOUNTER — Ambulatory Visit (HOSPITAL_COMMUNITY): Payer: Medicare HMO

## 2022-11-23 ENCOUNTER — Ambulatory Visit (HOSPITAL_COMMUNITY): Payer: Medicare HMO | Admitting: Physician Assistant

## 2022-11-23 ENCOUNTER — Ambulatory Visit (HOSPITAL_COMMUNITY)
Admission: RE | Admit: 2022-11-23 | Discharge: 2022-11-23 | Disposition: A | Discharge status: Home / Self Care | Payer: Medicare HMO | Source: Ambulatory Visit | Attending: Neurosurgery | Admitting: Neurosurgery

## 2022-11-23 ENCOUNTER — Encounter (HOSPITAL_COMMUNITY)
Admission: RE | Disposition: A | Discharge status: Home / Self Care | Payer: Self-pay | Source: Ambulatory Visit | Attending: Neurosurgery

## 2022-11-23 DIAGNOSIS — F17210 Nicotine dependence, cigarettes, uncomplicated: Secondary | ICD-10-CM | POA: Diagnosis not present

## 2022-11-23 DIAGNOSIS — Z79899 Other long term (current) drug therapy: Secondary | ICD-10-CM | POA: Insufficient documentation

## 2022-11-23 DIAGNOSIS — J449 Chronic obstructive pulmonary disease, unspecified: Secondary | ICD-10-CM | POA: Diagnosis not present

## 2022-11-23 DIAGNOSIS — F17200 Nicotine dependence, unspecified, uncomplicated: Secondary | ICD-10-CM | POA: Diagnosis not present

## 2022-11-23 DIAGNOSIS — D171 Benign lipomatous neoplasm of skin and subcutaneous tissue of trunk: Secondary | ICD-10-CM | POA: Insufficient documentation

## 2022-11-23 DIAGNOSIS — I10 Essential (primary) hypertension: Secondary | ICD-10-CM | POA: Diagnosis not present

## 2022-11-23 DIAGNOSIS — K219 Gastro-esophageal reflux disease without esophagitis: Secondary | ICD-10-CM | POA: Insufficient documentation

## 2022-11-23 DIAGNOSIS — E039 Hypothyroidism, unspecified: Secondary | ICD-10-CM | POA: Diagnosis not present

## 2022-11-23 DIAGNOSIS — Z4789 Encounter for other orthopedic aftercare: Secondary | ICD-10-CM | POA: Diagnosis not present

## 2022-11-23 HISTORY — DX: Hypothyroidism, unspecified: E03.9

## 2022-11-23 HISTORY — PX: LIPOMA EXCISION: SHX5283

## 2022-11-23 LAB — CBC
HCT: 36.2 % (ref 36.0–46.0)
Hemoglobin: 12 g/dL (ref 12.0–15.0)
MCH: 28.7 pg (ref 26.0–34.0)
MCHC: 33.1 g/dL (ref 30.0–36.0)
MCV: 86.6 fL (ref 80.0–100.0)
Platelets: 345 10*3/uL (ref 150–400)
RBC: 4.18 MIL/uL (ref 3.87–5.11)
RDW: 13.7 % (ref 11.5–15.5)
WBC: 10 10*3/uL (ref 4.0–10.5)
nRBC: 0 % (ref 0.0–0.2)

## 2022-11-23 LAB — BASIC METABOLIC PANEL
Anion gap: 16 — ABNORMAL HIGH (ref 5–15)
BUN: 10 mg/dL (ref 8–23)
CO2: 20 mmol/L — ABNORMAL LOW (ref 22–32)
Calcium: 9.2 mg/dL (ref 8.9–10.3)
Chloride: 105 mmol/L (ref 98–111)
Creatinine, Ser: 1.21 mg/dL — ABNORMAL HIGH (ref 0.44–1.00)
GFR, Estimated: 51 mL/min — ABNORMAL LOW (ref >60)
Glucose, Bld: 102 mg/dL — ABNORMAL HIGH (ref 70–99)
Potassium: 3.4 mmol/L — ABNORMAL LOW (ref 3.5–5.1)
Sodium: 141 mmol/L (ref 135–145)

## 2022-11-23 LAB — SURGICAL PCR SCREEN
MRSA, PCR: NEGATIVE
Staphylococcus aureus: NEGATIVE

## 2022-11-23 SURGERY — MINOR EXCISION LIPOMA
Anesthesia: General

## 2022-11-23 MED ORDER — PROPOFOL 10 MG/ML IV BOLUS
INTRAVENOUS | Status: AC
  Filled 2022-11-23: qty 20

## 2022-11-23 MED ORDER — LABETALOL HCL 5 MG/ML IV SOLN
INTRAVENOUS | Status: AC
  Filled 2022-11-23: qty 4

## 2022-11-23 MED ORDER — LIDOCAINE-EPINEPHRINE 0.5 %-1:200000 IJ SOLN
INTRAMUSCULAR | Status: AC
  Filled 2022-11-23: qty 50

## 2022-11-23 MED ORDER — ROCURONIUM BROMIDE 10 MG/ML (PF) SYRINGE
PREFILLED_SYRINGE | INTRAVENOUS | Status: AC
  Filled 2022-11-23: qty 10

## 2022-11-23 MED ORDER — PROPOFOL 10 MG/ML IV BOLUS
INTRAVENOUS | Status: DC | PRN
  Administered 2022-11-23 (×2): 50 mg via INTRAVENOUS
  Administered 2022-11-23: 100 mg via INTRAVENOUS

## 2022-11-23 MED ORDER — LIDOCAINE-EPINEPHRINE 0.5 %-1:200000 IJ SOLN
INTRAMUSCULAR | Status: DC | PRN
  Administered 2022-11-23: 30 mL via INTRADERMAL

## 2022-11-23 MED ORDER — 0.9 % SODIUM CHLORIDE (POUR BTL) OPTIME
TOPICAL | Status: DC | PRN
  Administered 2022-11-23: 1000 mL

## 2022-11-23 MED ORDER — HYDROMORPHONE HCL 1 MG/ML IJ SOLN
INTRAMUSCULAR | Status: AC
  Filled 2022-11-23: qty 1

## 2022-11-23 MED ORDER — LACTATED RINGERS IV SOLN: INTRAVENOUS | Status: DC

## 2022-11-23 MED ORDER — OXYCODONE HCL 5 MG PO TABS
5.0000 mg | ORAL_TABLET | Freq: Once | ORAL | Status: AC | PRN
  Administered 2022-11-23: 5 mg via ORAL

## 2022-11-23 MED ORDER — ONDANSETRON HCL 4 MG/2ML IJ SOLN
INTRAMUSCULAR | Status: AC
  Filled 2022-11-23: qty 2

## 2022-11-23 MED ORDER — MIDAZOLAM HCL 2 MG/2ML IJ SOLN
INTRAMUSCULAR | Status: AC
  Filled 2022-11-23: qty 2

## 2022-11-23 MED ORDER — CHLORHEXIDINE GLUCONATE CLOTH 2 % EX PADS: 6.0000 | MEDICATED_PAD | Freq: Once | CUTANEOUS | Status: DC

## 2022-11-23 MED ORDER — MIDAZOLAM HCL 2 MG/2ML IJ SOLN
INTRAMUSCULAR | Status: DC | PRN
  Administered 2022-11-23: 1 mg via INTRAVENOUS

## 2022-11-23 MED ORDER — ONDANSETRON HCL 4 MG/2ML IJ SOLN
INTRAMUSCULAR | Status: DC | PRN
  Administered 2022-11-23: 4 mg via INTRAVENOUS

## 2022-11-23 MED ORDER — ROCURONIUM BROMIDE 10 MG/ML (PF) SYRINGE
PREFILLED_SYRINGE | INTRAVENOUS | Status: DC | PRN
  Administered 2022-11-23: 60 mg via INTRAVENOUS
  Administered 2022-11-23: 10 mg via INTRAVENOUS

## 2022-11-23 MED ORDER — DEXAMETHASONE SODIUM PHOSPHATE 10 MG/ML IJ SOLN
INTRAMUSCULAR | Status: DC | PRN
  Administered 2022-11-23: 10 mg via INTRAVENOUS

## 2022-11-23 MED ORDER — CHLORHEXIDINE GLUCONATE 0.12 % MT SOLN
15.0000 mL | Freq: Once | OROMUCOSAL | Status: AC
  Administered 2022-11-23: 15 mL via OROMUCOSAL
  Filled 2022-11-23: qty 15

## 2022-11-23 MED ORDER — CEFAZOLIN SODIUM-DEXTROSE 2-4 GM/100ML-% IV SOLN
2.0000 g | INTRAVENOUS | Status: AC
  Administered 2022-11-23: 2 g via INTRAVENOUS
  Filled 2022-11-23: qty 100

## 2022-11-23 MED ORDER — LIDOCAINE 2% (20 MG/ML) 5 ML SYRINGE
INTRAMUSCULAR | Status: AC
  Filled 2022-11-23: qty 5

## 2022-11-23 MED ORDER — HYDROMORPHONE HCL 1 MG/ML IJ SOLN
INTRAMUSCULAR | Status: AC
  Filled 2022-11-23: qty 0.5

## 2022-11-23 MED ORDER — OXYCODONE HCL 5 MG/5ML PO SOLN: 5.0000 mg | Freq: Once | ORAL | Status: AC | PRN

## 2022-11-23 MED ORDER — THROMBIN 5000 UNITS EX SOLR
CUTANEOUS | Status: AC
  Filled 2022-11-23: qty 5000

## 2022-11-23 MED ORDER — ORAL CARE MOUTH RINSE: 15.0000 mL | Freq: Once | OROMUCOSAL | Status: AC

## 2022-11-23 MED ORDER — FENTANYL CITRATE (PF) 250 MCG/5ML IJ SOLN
INTRAMUSCULAR | Status: DC | PRN
  Administered 2022-11-23: 50 ug via INTRAVENOUS
  Administered 2022-11-23: 200 ug via INTRAVENOUS
  Administered 2022-11-23 (×2): 50 ug via INTRAVENOUS

## 2022-11-23 MED ORDER — HEMOSTATIC AGENTS (NO CHARGE) OPTIME
TOPICAL | Status: DC | PRN
  Administered 2022-11-23: 1 via TOPICAL

## 2022-11-23 MED ORDER — HYDROMORPHONE HCL 1 MG/ML IJ SOLN
INTRAMUSCULAR | Status: DC | PRN
  Administered 2022-11-23: .5 mg via INTRAVENOUS

## 2022-11-23 MED ORDER — THROMBIN 5000 UNITS EX SOLR
CUTANEOUS | Status: DC | PRN
  Administered 2022-11-23: 5000 [IU] via TOPICAL

## 2022-11-23 MED ORDER — PROMETHAZINE HCL 25 MG/ML IJ SOLN: 6.2500 mg | INTRAMUSCULAR | Status: DC | PRN

## 2022-11-23 MED ORDER — SUGAMMADEX SODIUM 200 MG/2ML IV SOLN
INTRAVENOUS | Status: DC | PRN
  Administered 2022-11-23: 200 mg via INTRAVENOUS

## 2022-11-23 MED ORDER — DEXAMETHASONE SODIUM PHOSPHATE 10 MG/ML IJ SOLN
INTRAMUSCULAR | Status: AC
  Filled 2022-11-23: qty 1

## 2022-11-23 MED ORDER — LABETALOL HCL 5 MG/ML IV SOLN
INTRAVENOUS | Status: DC | PRN
  Administered 2022-11-23: 10 mg via INTRAVENOUS

## 2022-11-23 MED ORDER — FENTANYL CITRATE (PF) 250 MCG/5ML IJ SOLN
INTRAMUSCULAR | Status: AC
  Filled 2022-11-23: qty 5

## 2022-11-23 MED ORDER — HYDROMORPHONE HCL 1 MG/ML IJ SOLN
0.2500 mg | INTRAMUSCULAR | Status: DC | PRN
  Administered 2022-11-23 (×4): 0.5 mg via INTRAVENOUS

## 2022-11-23 MED ORDER — MIDAZOLAM HCL 2 MG/2ML IJ SOLN: 0.5000 mg | Freq: Once | INTRAMUSCULAR | Status: DC | PRN

## 2022-11-23 MED ORDER — OXYCODONE HCL 5 MG PO TABS
ORAL_TABLET | ORAL | Status: AC
  Filled 2022-11-23: qty 1

## 2022-11-23 SURGICAL SUPPLY — 62 items
ADH SKN CLS APL DERMABOND .7 (GAUZE/BANDAGES/DRESSINGS) ×1
APL SKNCLS STERI-STRIP NONHPOA (GAUZE/BANDAGES/DRESSINGS)
BAG COUNTER SPONGE SURGICOUNT (BAG) ×2 IMPLANT
BAG SPNG CNTER NS LX DISP (BAG) ×1
BENZOIN TINCTURE PRP APPL 2/3 (GAUZE/BANDAGES/DRESSINGS) IMPLANT
BLADE CLIPPER SURG (BLADE) IMPLANT
CANISTER SUCT 3000ML PPV (MISCELLANEOUS) ×2 IMPLANT
CNTNR URN SCR LID CUP LEK RST (MISCELLANEOUS) IMPLANT
CONT SPEC 4OZ STRL OR WHT (MISCELLANEOUS) ×1
DERMABOND ADVANCED .7 DNX12 (GAUZE/BANDAGES/DRESSINGS) IMPLANT
DRAPE LAPAROTOMY 100X72X124 (DRAPES) ×2 IMPLANT
DRAPE SURG 17X23 STRL (DRAPES) ×2 IMPLANT
DURAPREP 26ML APPLICATOR (WOUND CARE) ×2 IMPLANT
ELECT REM PT RETURN 9FT ADLT (ELECTROSURGICAL) ×1
ELECTRODE REM PT RTRN 9FT ADLT (ELECTROSURGICAL) ×2 IMPLANT
GAUZE 4X4 16PLY ~~LOC~~+RFID DBL (SPONGE) IMPLANT
GAUZE SPONGE 4X4 12PLY STRL (GAUZE/BANDAGES/DRESSINGS) IMPLANT
GLOVE BIO SURGEON STRL SZ 6.5 (GLOVE) IMPLANT
GLOVE BIO SURGEON STRL SZ7 (GLOVE) IMPLANT
GLOVE BIO SURGEON STRL SZ7.5 (GLOVE) IMPLANT
GLOVE BIO SURGEON STRL SZ8 (GLOVE) IMPLANT
GLOVE BIO SURGEON STRL SZ8.5 (GLOVE) IMPLANT
GLOVE BIOGEL M 8.0 STRL (GLOVE) IMPLANT
GLOVE BIOGEL PI IND STRL 7.5 (GLOVE) IMPLANT
GLOVE ECLIPSE 6.5 STRL STRAW (GLOVE) ×2 IMPLANT
GLOVE ECLIPSE 7.0 STRL STRAW (GLOVE) IMPLANT
GLOVE ECLIPSE 7.5 STRL STRAW (GLOVE) IMPLANT
GLOVE ECLIPSE 8.0 STRL XLNG CF (GLOVE) IMPLANT
GLOVE ECLIPSE 8.5 STRL (GLOVE) IMPLANT
GLOVE EXAM NITRILE XL STR (GLOVE) IMPLANT
GLOVE INDICATOR 6.5 STRL GRN (GLOVE) IMPLANT
GLOVE INDICATOR 7.0 STRL GRN (GLOVE) IMPLANT
GLOVE INDICATOR 7.5 STRL GRN (GLOVE) IMPLANT
GLOVE INDICATOR 8.0 STRL GRN (GLOVE) IMPLANT
GLOVE INDICATOR 8.5 STRL (GLOVE) IMPLANT
GLOVE OPTIFIT SS 8.0 STRL (GLOVE) IMPLANT
GLOVE SURG SS PI 6.5 STRL IVOR (GLOVE) IMPLANT
GOWN STRL REUS W/ TWL LRG LVL3 (GOWN DISPOSABLE) ×4 IMPLANT
GOWN STRL REUS W/ TWL XL LVL3 (GOWN DISPOSABLE) IMPLANT
GOWN STRL REUS W/TWL 2XL LVL3 (GOWN DISPOSABLE) IMPLANT
GOWN STRL REUS W/TWL LRG LVL3 (GOWN DISPOSABLE) ×3
GOWN STRL REUS W/TWL XL LVL3 (GOWN DISPOSABLE)
KIT BASIN OR (CUSTOM PROCEDURE TRAY) ×2 IMPLANT
KIT TURNOVER KIT B (KITS) ×2 IMPLANT
NS IRRIG 1000ML POUR BTL (IV SOLUTION) ×2 IMPLANT
PACK LAMINECTOMY NEURO (CUSTOM PROCEDURE TRAY) ×2 IMPLANT
PAD ARMBOARD 7.5X6 YLW CONV (MISCELLANEOUS) ×6 IMPLANT
SOL ELECTROSURG ANTI STICK (MISCELLANEOUS) ×1
SOLUTION ELECTROSURG ANTI STCK (MISCELLANEOUS) ×2 IMPLANT
SPIKE FLUID TRANSFER (MISCELLANEOUS) ×2 IMPLANT
SPONGE SURGIFOAM ABS GEL SZ50 (HEMOSTASIS) ×2 IMPLANT
SPONGE T-LAP 4X18 ~~LOC~~+RFID (SPONGE) IMPLANT
STRIP CLOSURE SKIN 1/2X4 (GAUZE/BANDAGES/DRESSINGS) IMPLANT
SUT VIC AB 0 CT1 18XCR BRD8 (SUTURE) ×2 IMPLANT
SUT VIC AB 0 CT1 8-18 (SUTURE) ×2
SUT VIC AB 2-0 CT1 18 (SUTURE) ×2 IMPLANT
SUT VIC AB 3-0 SH 8-18 (SUTURE) ×2 IMPLANT
SWAB COLLECTION DEVICE MRSA (MISCELLANEOUS) IMPLANT
SWAB CULTURE ESWAB REG 1ML (MISCELLANEOUS) IMPLANT
TOWEL GREEN STERILE (TOWEL DISPOSABLE) ×2 IMPLANT
TOWEL GREEN STERILE FF (TOWEL DISPOSABLE) ×2 IMPLANT
WATER STERILE IRR 1000ML POUR (IV SOLUTION) ×2 IMPLANT

## 2022-11-23 NOTE — H&P (Signed)
BP (!) 158/74   Pulse (!) 59   Temp 98.1 F (36.7 C) (Oral)   Resp 17   Ht 5\' 6"  (1.676 m)   Wt 88 kg   SpO2 97%   BMI 31.31 kg/m  Today I saw [name] in the office.  What I am going to do is have her undergo an MRI of the lumbar spine with and without contrast.  I am also slightly concerned about this mass in the lumbar region at L5-S1 which the radiologist mentioned in September could possibly be a liposarcoma.  So honestly that is the biggest reason for the scan today.  The scan in September did not raise any other serious issue.  She had some chronic changes, but will find out what things look like today.       Thank you for your referral.     [name] is a patient who underwent an L5-S1 laminotomy with Dr. Bevely Palmer in 2017.  She has returned to me today secondary to her complaints of back pain and lower extremity spasms.  She says the pain has caused her to fall multiple times.  She has gotten worse over the last month.  Over the last month she said she turned the wrong way and then felt this horrible pain going down.     On her intake sheet today, she states she has a history of hypertension, that she smokes.  She gave no family history.  Says she has had 3 months of low back pain, fell about 2 months ago and made it worse recently.  She says the pain was worse after fall, unable to position both her legs. The mass that she has in the left paraspinous tissue is unchanged in size. Radiologist mentioned a liposarcoma    REVIEW OF SYSTEMS:  Positive for swelling in the legs, leg pain bilaterally, leg weakness bilaterally, joint pain bilaterally.     PHYSICAL EXAMINATION:  She is alert, oriented by 4.  She answers all questions appropriately.  She has a markedly slow and very cautious gait.  Her pain limits her movements, but she has at the very least 5- over 5 strength.  She has problems with doing a toe walk secondary to balance, not strength, and heel walking again  secondary to balance not strength.  She asked me do I know why her balance is so poor.  I said it is certainly not anything explained on the basis of the lumbar spine MRI.  Reflexes are quite brisk, 2+ at the knees, ankles, biceps, triceps, and brachioradialis.  Pupils equal, round, and reactive to light.  Full extraocular movements.  Full visual fields.  Hearing intact to voice.  Uvula elevates in midline.  Shoulder shrug is normal.  Tongue protrudes in the midline.    When I look back at the MRIs, and went all way to 2017, this mass that the radiologist has described as a possible liposarcoma has grown since that time period and at this point, especially since the daughter says they have a family history of cancer, I do not have an excuse and, therefore, will proceed to take out the soft tissue mass in the paraspinous soft tissue on the left side.  I am also going to have her go for injections at L4-5 where she has a listhesis and what appears to be a herniated disc at 4-5.  She describes horrible pain in the back and both lower extremities, that she just cannot do anything because  of the pain.  But this left-sided lesion is not causing pain in the right.  The right side actually looks fairly good and the soft tissue mass is not causing this pain at all.  So, I will do an injection there, but I will get her to the operating room to take out this mass so it can be identified

## 2022-11-23 NOTE — Transfer of Care (Signed)
Immediate Anesthesia Transfer of Care Note  Patient: [name]  Procedure(s) Performed: Removal of lipoma, L5 paraspinous soft tissue  Patient Location: PACU  Anesthesia Type:General  Level of Consciousness: awake, alert , and oriented  Airway & Oxygen Therapy: Patient Spontanous Breathing  Post-op Assessment: Report given to RN, Post -op Vital signs reviewed and stable, and Patient moving all extremities X 4  Post vital signs: Reviewed and stable  Last Vitals:  Vitals Value Taken Time  BP 159/84 11/23/22 0953  Temp    Pulse 71 11/23/22 0954  Resp 22 11/23/22 0954  SpO2 100 % 11/23/22 0954  Vitals shown include unvalidated device data.  Last Pain:  Vitals:   11/23/22 0637  TempSrc:   PainSc: 10-Worst pain ever         Complications: There were no known notable events for this encounter.

## 2022-11-23 NOTE — Anesthesia Procedure Notes (Addendum)

## 2022-11-23 NOTE — Anesthesia Postprocedure Evaluation (Signed)
Anesthesia Post Note  Patient: [name]  Procedure(s) Performed: Removal of lipoma, L5 paraspinous soft tissue     Patient location during evaluation: PACU Anesthesia Type: General Level of consciousness: awake and alert, patient cooperative and oriented Pain management: pain level controlled Vital Signs Assessment: post-procedure vital signs reviewed and stable Respiratory status: spontaneous breathing, nonlabored ventilation and respiratory function stable Cardiovascular status: blood pressure returned to baseline and stable Postop Assessment: no apparent nausea or vomiting Anesthetic complications: no   There were no known notable events for this encounter.  Last Vitals:  Vitals:   11/23/22 1100 11/23/22 1115  BP: (!) 154/96 (!) 148/73  Pulse: 62 62  Resp: 14 14  Temp:  36.6 C  SpO2: 96% 95%    Last Pain:  Vitals:   11/23/22 1115  TempSrc:   PainSc: 4                  Darrio Bade,E. Francesco Provencal

## 2022-11-23 NOTE — Discharge Instructions (Addendum)
Call the office if you have a temperature greater than 101.5, redness, tenderness, or the wound is draining [phone]

## 2022-11-25 ENCOUNTER — Encounter (HOSPITAL_COMMUNITY): Payer: Self-pay | Admitting: Neurosurgery

## 2022-11-27 DIAGNOSIS — E6609 Other obesity due to excess calories: Secondary | ICD-10-CM | POA: Diagnosis not present

## 2022-11-27 DIAGNOSIS — Z6836 Body mass index (BMI) 36.0-36.9, adult: Secondary | ICD-10-CM | POA: Diagnosis not present

## 2022-11-27 DIAGNOSIS — M545 Low back pain, unspecified: Secondary | ICD-10-CM | POA: Diagnosis not present

## 2022-11-27 DIAGNOSIS — G894 Chronic pain syndrome: Secondary | ICD-10-CM | POA: Diagnosis not present

## 2022-11-27 DIAGNOSIS — Z1331 Encounter for screening for depression: Secondary | ICD-10-CM | POA: Diagnosis not present

## 2022-11-27 DIAGNOSIS — E2749 Other adrenocortical insufficiency: Secondary | ICD-10-CM | POA: Diagnosis not present

## 2022-11-27 DIAGNOSIS — Z0001 Encounter for general adult medical examination with abnormal findings: Secondary | ICD-10-CM | POA: Diagnosis not present

## 2022-11-27 DIAGNOSIS — D45 Polycythemia vera: Secondary | ICD-10-CM | POA: Diagnosis not present

## 2022-11-27 DIAGNOSIS — Z9229 Personal history of other drug therapy: Secondary | ICD-10-CM | POA: Diagnosis not present

## 2022-11-27 DIAGNOSIS — F3181 Bipolar II disorder: Secondary | ICD-10-CM | POA: Diagnosis not present

## 2022-11-27 DIAGNOSIS — I10 Essential (primary) hypertension: Secondary | ICD-10-CM | POA: Diagnosis not present

## 2022-11-27 DIAGNOSIS — D518 Other vitamin B12 deficiency anemias: Secondary | ICD-10-CM | POA: Diagnosis not present

## 2022-11-27 LAB — SURGICAL PATHOLOGY

## 2022-11-27 NOTE — Op Note (Signed)
11/23/2022  8:27 PM  PATIENT:  [name]  62 y.o. female With a soft tissue mass located in the paraspinous musculature at the L5/S1 level on the left. Serial mri's have shown the mass has grown prompting my recommendation to remove the mass PRE-OPERATIVE DIAGNOSIS:  Lipoma of lower back  POST-OPERATIVE DIAGNOSIS:  Lipoma of lower back  PROCEDURE:  Procedure(s): Removal of lipoma, L5 paraspinous soft tissue  SURGEON: Surgeon(s): Coletta Memos, MD  ASSISTANTS:none  ANESTHESIA:   local, general, and IV sedation  EBL:  No intake/output data recorded.  BLOOD ADMINISTERED:none  CELL SAVER GIVEN:not used  COUNT:correct per nursing  DRAINS: none   SPECIMEN:  Source of Specimen:  left paraspinous musculature  DICTATION: [name] was taken to the operating room, intubated, and placed under a general anesthetic without difficulty. She was positioned prone on a wilson frame. Her back was prepped and draped in a sterile manner.  I infiltrated lidocaine in the planned midline incision using fluoroscopy to plan the entry site. I opened the skin with a 10 blade and carried the dissection to the thoracolumbar fascia. I was able to palpate the mass just underneath the fascia. I opened the fascia, exposing the mass and started my dissection and resection.  I worked around the mass with both sharp and blunt technique freeing it from the surrounding musculature. I delivered it with the capsule intact and sent it for pathological analysis.  I controlled bleeding in the resection bed. Then closed the fascial opening. I closed the incision with vicryl sutures approximating the subcutaneous and subcuticular planes. I applied dermabond, for a sterile dressing.  PLAN OF CARE: Discharge to home after PACU  PATIENT DISPOSITION:  PACU - hemodynamically stable.   Delay start of Pharmacological VTE agent (>24hrs) due to surgical blood loss or risk of bleeding:  no

## 2022-12-13 DIAGNOSIS — M4316 Spondylolisthesis, lumbar region: Secondary | ICD-10-CM | POA: Diagnosis not present

## 2022-12-13 DIAGNOSIS — Z6833 Body mass index (BMI) 33.0-33.9, adult: Secondary | ICD-10-CM | POA: Diagnosis not present

## 2022-12-24 DIAGNOSIS — F3181 Bipolar II disorder: Secondary | ICD-10-CM | POA: Diagnosis not present

## 2022-12-24 DIAGNOSIS — K7469 Other cirrhosis of liver: Secondary | ICD-10-CM | POA: Diagnosis not present

## 2022-12-24 DIAGNOSIS — I10 Essential (primary) hypertension: Secondary | ICD-10-CM | POA: Diagnosis not present

## 2023-01-22 DIAGNOSIS — M5136 Other intervertebral disc degeneration, lumbar region: Secondary | ICD-10-CM | POA: Diagnosis not present

## 2023-01-22 DIAGNOSIS — E6609 Other obesity due to excess calories: Secondary | ICD-10-CM | POA: Diagnosis not present

## 2023-01-22 DIAGNOSIS — F329 Major depressive disorder, single episode, unspecified: Secondary | ICD-10-CM | POA: Diagnosis not present

## 2023-01-22 DIAGNOSIS — F419 Anxiety disorder, unspecified: Secondary | ICD-10-CM | POA: Diagnosis not present

## 2023-01-22 DIAGNOSIS — D518 Other vitamin B12 deficiency anemias: Secondary | ICD-10-CM | POA: Diagnosis not present

## 2023-01-22 DIAGNOSIS — I10 Essential (primary) hypertension: Secondary | ICD-10-CM | POA: Diagnosis not present

## 2023-01-22 DIAGNOSIS — Z6836 Body mass index (BMI) 36.0-36.9, adult: Secondary | ICD-10-CM | POA: Diagnosis not present

## 2023-01-22 DIAGNOSIS — F3181 Bipolar II disorder: Secondary | ICD-10-CM | POA: Diagnosis not present

## 2023-01-22 DIAGNOSIS — E063 Autoimmune thyroiditis: Secondary | ICD-10-CM | POA: Diagnosis not present

## 2023-01-22 DIAGNOSIS — K7469 Other cirrhosis of liver: Secondary | ICD-10-CM | POA: Diagnosis not present

## 2023-01-22 DIAGNOSIS — M545 Low back pain, unspecified: Secondary | ICD-10-CM | POA: Diagnosis not present

## 2023-03-01 DIAGNOSIS — D518 Other vitamin B12 deficiency anemias: Secondary | ICD-10-CM | POA: Diagnosis not present

## 2023-03-01 DIAGNOSIS — E2749 Other adrenocortical insufficiency: Secondary | ICD-10-CM | POA: Diagnosis not present

## 2023-03-01 DIAGNOSIS — M79601 Pain in right arm: Secondary | ICD-10-CM | POA: Diagnosis not present

## 2023-03-01 DIAGNOSIS — M545 Low back pain, unspecified: Secondary | ICD-10-CM | POA: Diagnosis not present

## 2023-03-01 DIAGNOSIS — E6609 Other obesity due to excess calories: Secondary | ICD-10-CM | POA: Diagnosis not present

## 2023-03-01 DIAGNOSIS — Z6834 Body mass index (BMI) 34.0-34.9, adult: Secondary | ICD-10-CM | POA: Diagnosis not present

## 2023-03-01 DIAGNOSIS — I10 Essential (primary) hypertension: Secondary | ICD-10-CM | POA: Diagnosis not present

## 2023-03-01 DIAGNOSIS — M5136 Other intervertebral disc degeneration, lumbar region: Secondary | ICD-10-CM | POA: Diagnosis not present

## 2023-03-29 DIAGNOSIS — K7469 Other cirrhosis of liver: Secondary | ICD-10-CM | POA: Diagnosis not present

## 2023-03-29 DIAGNOSIS — M5134 Other intervertebral disc degeneration, thoracic region: Secondary | ICD-10-CM | POA: Diagnosis not present

## 2023-03-29 DIAGNOSIS — F3181 Bipolar II disorder: Secondary | ICD-10-CM | POA: Diagnosis not present

## 2023-03-29 DIAGNOSIS — G894 Chronic pain syndrome: Secondary | ICD-10-CM | POA: Diagnosis not present

## 2023-04-15 DIAGNOSIS — M4316 Spondylolisthesis, lumbar region: Secondary | ICD-10-CM | POA: Diagnosis not present

## 2023-04-15 DIAGNOSIS — D171 Benign lipomatous neoplasm of skin and subcutaneous tissue of trunk: Secondary | ICD-10-CM | POA: Diagnosis not present

## 2023-04-15 DIAGNOSIS — Z6832 Body mass index (BMI) 32.0-32.9, adult: Secondary | ICD-10-CM | POA: Diagnosis not present

## 2023-04-15 DIAGNOSIS — S2242XA Multiple fractures of ribs, left side, initial encounter for closed fracture: Secondary | ICD-10-CM | POA: Diagnosis not present

## 2023-04-18 ENCOUNTER — Other Ambulatory Visit (HOSPITAL_COMMUNITY): Payer: Self-pay | Admitting: Neurosurgery

## 2023-04-18 DIAGNOSIS — D171 Benign lipomatous neoplasm of skin and subcutaneous tissue of trunk: Secondary | ICD-10-CM

## 2023-04-21 ENCOUNTER — Ambulatory Visit (HOSPITAL_COMMUNITY)
Admission: RE | Admit: 2023-04-21 | Discharge: 2023-04-21 | Disposition: A | Discharge status: Home / Self Care | Payer: Medicare HMO | Source: Ambulatory Visit | Attending: Neurosurgery | Admitting: Neurosurgery

## 2023-04-21 DIAGNOSIS — D171 Benign lipomatous neoplasm of skin and subcutaneous tissue of trunk: Secondary | ICD-10-CM | POA: Insufficient documentation

## 2023-04-21 DIAGNOSIS — M51369 Other intervertebral disc degeneration, lumbar region without mention of lumbar back pain or lower extremity pain: Secondary | ICD-10-CM | POA: Diagnosis not present

## 2023-04-21 DIAGNOSIS — M4316 Spondylolisthesis, lumbar region: Secondary | ICD-10-CM | POA: Diagnosis not present

## 2023-04-21 DIAGNOSIS — M48061 Spinal stenosis, lumbar region without neurogenic claudication: Secondary | ICD-10-CM | POA: Diagnosis not present

## 2023-04-21 DIAGNOSIS — M47816 Spondylosis without myelopathy or radiculopathy, lumbar region: Secondary | ICD-10-CM | POA: Diagnosis not present

## 2023-04-21 MED ORDER — GADOBUTROL 1 MMOL/ML IV SOLN
9.0000 mL | Freq: Once | INTRAVENOUS | Status: AC | PRN
  Administered 2023-04-21: 9 mL via INTRAVENOUS

## 2023-04-26 DIAGNOSIS — D473 Essential (hemorrhagic) thrombocythemia: Secondary | ICD-10-CM | POA: Diagnosis not present

## 2023-04-26 DIAGNOSIS — M545 Low back pain, unspecified: Secondary | ICD-10-CM | POA: Diagnosis not present

## 2023-04-26 DIAGNOSIS — F3181 Bipolar II disorder: Secondary | ICD-10-CM | POA: Diagnosis not present

## 2023-04-26 DIAGNOSIS — I10 Essential (primary) hypertension: Secondary | ICD-10-CM | POA: Diagnosis not present

## 2023-04-26 DIAGNOSIS — M5134 Other intervertebral disc degeneration, thoracic region: Secondary | ICD-10-CM | POA: Diagnosis not present

## 2023-05-10 DIAGNOSIS — M62838 Other muscle spasm: Secondary | ICD-10-CM | POA: Diagnosis not present

## 2023-05-10 DIAGNOSIS — M1990 Unspecified osteoarthritis, unspecified site: Secondary | ICD-10-CM | POA: Diagnosis not present

## 2023-05-10 DIAGNOSIS — J449 Chronic obstructive pulmonary disease, unspecified: Secondary | ICD-10-CM | POA: Diagnosis not present

## 2023-05-10 DIAGNOSIS — G4700 Insomnia, unspecified: Secondary | ICD-10-CM | POA: Diagnosis not present

## 2023-05-10 DIAGNOSIS — E039 Hypothyroidism, unspecified: Secondary | ICD-10-CM | POA: Diagnosis not present

## 2023-05-10 DIAGNOSIS — E669 Obesity, unspecified: Secondary | ICD-10-CM | POA: Diagnosis not present

## 2023-05-10 DIAGNOSIS — M545 Low back pain, unspecified: Secondary | ICD-10-CM | POA: Diagnosis not present

## 2023-05-10 DIAGNOSIS — K219 Gastro-esophageal reflux disease without esophagitis: Secondary | ICD-10-CM | POA: Diagnosis not present

## 2023-05-10 DIAGNOSIS — N1831 Chronic kidney disease, stage 3a: Secondary | ICD-10-CM | POA: Diagnosis not present

## 2023-05-10 DIAGNOSIS — F325 Major depressive disorder, single episode, in full remission: Secondary | ICD-10-CM | POA: Diagnosis not present

## 2023-05-10 DIAGNOSIS — K589 Irritable bowel syndrome without diarrhea: Secondary | ICD-10-CM | POA: Diagnosis not present

## 2023-05-10 DIAGNOSIS — F411 Generalized anxiety disorder: Secondary | ICD-10-CM | POA: Diagnosis not present

## 2023-05-31 DIAGNOSIS — E6609 Other obesity due to excess calories: Secondary | ICD-10-CM | POA: Diagnosis not present

## 2023-05-31 DIAGNOSIS — Z6838 Body mass index (BMI) 38.0-38.9, adult: Secondary | ICD-10-CM | POA: Diagnosis not present

## 2023-05-31 DIAGNOSIS — G894 Chronic pain syndrome: Secondary | ICD-10-CM | POA: Diagnosis not present

## 2023-05-31 DIAGNOSIS — I10 Essential (primary) hypertension: Secondary | ICD-10-CM | POA: Diagnosis not present

## 2023-05-31 DIAGNOSIS — M5134 Other intervertebral disc degeneration, thoracic region: Secondary | ICD-10-CM | POA: Diagnosis not present

## 2023-05-31 DIAGNOSIS — F3181 Bipolar II disorder: Secondary | ICD-10-CM | POA: Diagnosis not present

## 2023-05-31 DIAGNOSIS — D473 Essential (hemorrhagic) thrombocythemia: Secondary | ICD-10-CM | POA: Diagnosis not present

## 2023-05-31 DIAGNOSIS — E538 Deficiency of other specified B group vitamins: Secondary | ICD-10-CM | POA: Diagnosis not present

## 2023-05-31 DIAGNOSIS — E2749 Other adrenocortical insufficiency: Secondary | ICD-10-CM | POA: Diagnosis not present

## 2023-05-31 DIAGNOSIS — D45 Polycythemia vera: Secondary | ICD-10-CM | POA: Diagnosis not present

## 2023-06-04 DIAGNOSIS — E063 Autoimmune thyroiditis: Secondary | ICD-10-CM | POA: Diagnosis not present

## 2023-06-04 DIAGNOSIS — I10 Essential (primary) hypertension: Secondary | ICD-10-CM | POA: Diagnosis not present

## 2023-06-27 DIAGNOSIS — G894 Chronic pain syndrome: Secondary | ICD-10-CM | POA: Diagnosis not present

## 2023-06-27 DIAGNOSIS — F329 Major depressive disorder, single episode, unspecified: Secondary | ICD-10-CM | POA: Diagnosis not present

## 2023-06-27 DIAGNOSIS — F419 Anxiety disorder, unspecified: Secondary | ICD-10-CM | POA: Diagnosis not present

## 2023-06-27 DIAGNOSIS — E6609 Other obesity due to excess calories: Secondary | ICD-10-CM | POA: Diagnosis not present

## 2023-06-27 DIAGNOSIS — D518 Other vitamin B12 deficiency anemias: Secondary | ICD-10-CM | POA: Diagnosis not present

## 2023-06-27 DIAGNOSIS — I10 Essential (primary) hypertension: Secondary | ICD-10-CM | POA: Diagnosis not present

## 2023-06-27 DIAGNOSIS — Z6837 Body mass index (BMI) 37.0-37.9, adult: Secondary | ICD-10-CM | POA: Diagnosis not present

## 2023-06-27 DIAGNOSIS — M5134 Other intervertebral disc degeneration, thoracic region: Secondary | ICD-10-CM | POA: Diagnosis not present

## 2023-07-01 DIAGNOSIS — Z6834 Body mass index (BMI) 34.0-34.9, adult: Secondary | ICD-10-CM | POA: Diagnosis not present

## 2023-07-01 DIAGNOSIS — D171 Benign lipomatous neoplasm of skin and subcutaneous tissue of trunk: Secondary | ICD-10-CM | POA: Diagnosis not present

## 2023-07-01 DIAGNOSIS — M4316 Spondylolisthesis, lumbar region: Secondary | ICD-10-CM | POA: Diagnosis not present

## 2023-07-25 DIAGNOSIS — K7469 Other cirrhosis of liver: Secondary | ICD-10-CM | POA: Diagnosis not present

## 2023-07-25 DIAGNOSIS — G894 Chronic pain syndrome: Secondary | ICD-10-CM | POA: Diagnosis not present

## 2023-07-25 DIAGNOSIS — M5134 Other intervertebral disc degeneration, thoracic region: Secondary | ICD-10-CM | POA: Diagnosis not present

## 2023-07-25 DIAGNOSIS — A084 Viral intestinal infection, unspecified: Secondary | ICD-10-CM | POA: Diagnosis not present

## 2023-07-25 DIAGNOSIS — E2749 Other adrenocortical insufficiency: Secondary | ICD-10-CM | POA: Diagnosis not present

## 2023-07-25 DIAGNOSIS — I10 Essential (primary) hypertension: Secondary | ICD-10-CM | POA: Diagnosis not present

## 2023-07-25 DIAGNOSIS — D45 Polycythemia vera: Secondary | ICD-10-CM | POA: Diagnosis not present

## 2023-08-02 DIAGNOSIS — M5134 Other intervertebral disc degeneration, thoracic region: Secondary | ICD-10-CM | POA: Diagnosis not present

## 2023-08-02 DIAGNOSIS — F5101 Primary insomnia: Secondary | ICD-10-CM | POA: Diagnosis not present

## 2023-08-02 DIAGNOSIS — G894 Chronic pain syndrome: Secondary | ICD-10-CM | POA: Diagnosis not present

## 2023-08-20 DIAGNOSIS — K7469 Other cirrhosis of liver: Secondary | ICD-10-CM | POA: Diagnosis not present

## 2023-08-20 DIAGNOSIS — E2749 Other adrenocortical insufficiency: Secondary | ICD-10-CM | POA: Diagnosis not present

## 2023-08-20 DIAGNOSIS — F419 Anxiety disorder, unspecified: Secondary | ICD-10-CM | POA: Diagnosis not present

## 2023-08-20 DIAGNOSIS — G894 Chronic pain syndrome: Secondary | ICD-10-CM | POA: Diagnosis not present

## 2023-08-20 DIAGNOSIS — K047 Periapical abscess without sinus: Secondary | ICD-10-CM | POA: Diagnosis not present

## 2023-08-20 DIAGNOSIS — I10 Essential (primary) hypertension: Secondary | ICD-10-CM | POA: Diagnosis not present

## 2023-09-02 DIAGNOSIS — I4891 Unspecified atrial fibrillation: Secondary | ICD-10-CM | POA: Diagnosis not present

## 2023-09-02 DIAGNOSIS — D6869 Other thrombophilia: Secondary | ICD-10-CM | POA: Diagnosis not present

## 2023-09-02 DIAGNOSIS — F1120 Opioid dependence, uncomplicated: Secondary | ICD-10-CM | POA: Diagnosis not present

## 2023-09-02 DIAGNOSIS — I130 Hypertensive heart and chronic kidney disease with heart failure and stage 1 through stage 4 chronic kidney disease, or unspecified chronic kidney disease: Secondary | ICD-10-CM | POA: Diagnosis not present

## 2023-09-18 ENCOUNTER — Emergency Department (HOSPITAL_COMMUNITY)

## 2023-09-18 ENCOUNTER — Other Ambulatory Visit: Payer: Self-pay

## 2023-09-18 ENCOUNTER — Emergency Department (HOSPITAL_COMMUNITY)
Admission: EM | Admit: 2023-09-18 | Discharge: 2023-09-18 | Disposition: A | Discharge status: Home / Self Care | Attending: Emergency Medicine | Admitting: Emergency Medicine

## 2023-09-18 ENCOUNTER — Encounter (HOSPITAL_COMMUNITY): Payer: Self-pay | Admitting: Emergency Medicine

## 2023-09-18 DIAGNOSIS — Z91040 Latex allergy status: Secondary | ICD-10-CM | POA: Diagnosis not present

## 2023-09-18 DIAGNOSIS — J449 Chronic obstructive pulmonary disease, unspecified: Secondary | ICD-10-CM | POA: Insufficient documentation

## 2023-09-18 DIAGNOSIS — F3181 Bipolar II disorder: Secondary | ICD-10-CM | POA: Diagnosis not present

## 2023-09-18 DIAGNOSIS — R0602 Shortness of breath: Secondary | ICD-10-CM | POA: Diagnosis not present

## 2023-09-18 DIAGNOSIS — K7460 Unspecified cirrhosis of liver: Secondary | ICD-10-CM | POA: Diagnosis not present

## 2023-09-18 DIAGNOSIS — Z6837 Body mass index (BMI) 37.0-37.9, adult: Secondary | ICD-10-CM | POA: Diagnosis not present

## 2023-09-18 DIAGNOSIS — Z8709 Personal history of other diseases of the respiratory system: Secondary | ICD-10-CM

## 2023-09-18 DIAGNOSIS — R062 Wheezing: Secondary | ICD-10-CM | POA: Diagnosis not present

## 2023-09-18 DIAGNOSIS — I130 Hypertensive heart and chronic kidney disease with heart failure and stage 1 through stage 4 chronic kidney disease, or unspecified chronic kidney disease: Secondary | ICD-10-CM | POA: Diagnosis not present

## 2023-09-18 DIAGNOSIS — I10 Essential (primary) hypertension: Secondary | ICD-10-CM | POA: Diagnosis not present

## 2023-09-18 DIAGNOSIS — Z8719 Personal history of other diseases of the digestive system: Secondary | ICD-10-CM

## 2023-09-18 DIAGNOSIS — E2749 Other adrenocortical insufficiency: Secondary | ICD-10-CM | POA: Diagnosis not present

## 2023-09-18 DIAGNOSIS — K922 Gastrointestinal hemorrhage, unspecified: Secondary | ICD-10-CM | POA: Insufficient documentation

## 2023-09-18 DIAGNOSIS — D45 Polycythemia vera: Secondary | ICD-10-CM | POA: Diagnosis not present

## 2023-09-18 DIAGNOSIS — R1013 Epigastric pain: Secondary | ICD-10-CM | POA: Diagnosis present

## 2023-09-18 DIAGNOSIS — D473 Essential (hemorrhagic) thrombocythemia: Secondary | ICD-10-CM | POA: Diagnosis not present

## 2023-09-18 DIAGNOSIS — D518 Other vitamin B12 deficiency anemias: Secondary | ICD-10-CM | POA: Diagnosis not present

## 2023-09-18 DIAGNOSIS — K7689 Other specified diseases of liver: Secondary | ICD-10-CM | POA: Diagnosis not present

## 2023-09-18 DIAGNOSIS — F1120 Opioid dependence, uncomplicated: Secondary | ICD-10-CM | POA: Diagnosis not present

## 2023-09-18 DIAGNOSIS — R109 Unspecified abdominal pain: Secondary | ICD-10-CM | POA: Diagnosis not present

## 2023-09-18 DIAGNOSIS — E6609 Other obesity due to excess calories: Secondary | ICD-10-CM | POA: Diagnosis not present

## 2023-09-18 DIAGNOSIS — G894 Chronic pain syndrome: Secondary | ICD-10-CM | POA: Diagnosis not present

## 2023-09-18 LAB — COMPREHENSIVE METABOLIC PANEL WITH GFR
ALT: 15 U/L (ref 0–44)
AST: 25 U/L (ref 15–41)
Albumin: 3.3 g/dL — ABNORMAL LOW (ref 3.5–5.0)
Alkaline Phosphatase: 230 U/L — ABNORMAL HIGH (ref 38–126)
Anion gap: 13 (ref 5–15)
BUN: 28 mg/dL — ABNORMAL HIGH (ref 8–23)
CO2: 26 mmol/L (ref 22–32)
Calcium: 9.9 mg/dL (ref 8.9–10.3)
Chloride: 98 mmol/L (ref 98–111)
Creatinine, Ser: 1.31 mg/dL — ABNORMAL HIGH (ref 0.44–1.00)
GFR, Estimated: 46 mL/min — ABNORMAL LOW (ref >60)
Glucose, Bld: 95 mg/dL (ref 70–99)
Potassium: 4.1 mmol/L (ref 3.5–5.1)
Sodium: 137 mmol/L (ref 135–145)
Total Bilirubin: 0.3 mg/dL (ref 0.0–1.2)
Total Protein: 6.7 g/dL (ref 6.5–8.1)

## 2023-09-18 LAB — CBC WITH DIFFERENTIAL/PLATELET
Abs Immature Granulocytes: 0.01 10*3/uL (ref 0.00–0.07)
Basophils Absolute: 0.1 10*3/uL (ref 0.0–0.1)
Basophils Relative: 1 %
Eosinophils Absolute: 0.7 10*3/uL — ABNORMAL HIGH (ref 0.0–0.5)
Eosinophils Relative: 10 %
HCT: 31.9 % — ABNORMAL LOW (ref 36.0–46.0)
Hemoglobin: 10.3 g/dL — ABNORMAL LOW (ref 12.0–15.0)
Immature Granulocytes: 0 %
Lymphocytes Relative: 40 %
Lymphs Abs: 2.9 10*3/uL (ref 0.7–4.0)
MCH: 28.6 pg (ref 26.0–34.0)
MCHC: 32.3 g/dL (ref 30.0–36.0)
MCV: 88.6 fL (ref 80.0–100.0)
Monocytes Absolute: 0.9 10*3/uL (ref 0.1–1.0)
Monocytes Relative: 13 %
Neutro Abs: 2.6 10*3/uL (ref 1.7–7.7)
Neutrophils Relative %: 36 %
Platelets: 280 10*3/uL (ref 150–400)
RBC: 3.6 MIL/uL — ABNORMAL LOW (ref 3.87–5.11)
RDW: 13.7 % (ref 11.5–15.5)
WBC: 7.2 10*3/uL (ref 4.0–10.5)
nRBC: 0 % (ref 0.0–0.2)

## 2023-09-18 LAB — TYPE AND SCREEN
ABO/RH(D): A POS
Antibody Screen: NEGATIVE

## 2023-09-18 LAB — BLOOD GAS, VENOUS
Acid-Base Excess: 8.6 mmol/L — ABNORMAL HIGH (ref 0.0–2.0)
Bicarbonate: 34.1 mmol/L — ABNORMAL HIGH (ref 20.0–28.0)
Drawn by: 7012
O2 Saturation: 94.2 %
Patient temperature: 37.1
pCO2, Ven: 49 mmHg (ref 44–60)
pH, Ven: 7.45 — ABNORMAL HIGH (ref 7.25–7.43)
pO2, Ven: 63 mmHg — ABNORMAL HIGH (ref 32–45)

## 2023-09-18 LAB — PROTIME-INR
INR: 1.1 (ref 0.8–1.2)
Prothrombin Time: 14 s (ref 11.4–15.2)

## 2023-09-18 LAB — AMMONIA: Ammonia: 95 umol/L — ABNORMAL HIGH (ref 9–35)

## 2023-09-18 LAB — BRAIN NATRIURETIC PEPTIDE: B Natriuretic Peptide: 132 pg/mL — ABNORMAL HIGH (ref 0.0–100.0)

## 2023-09-18 LAB — LIPASE, BLOOD: Lipase: 28 U/L (ref 11–51)

## 2023-09-18 MED ORDER — IOHEXOL 300 MG/ML  SOLN
100.0000 mL | Freq: Once | INTRAMUSCULAR | Status: AC | PRN
  Administered 2023-09-18: 80 mL via INTRAVENOUS

## 2023-09-18 MED ORDER — HYDROMORPHONE HCL 1 MG/ML IJ SOLN
0.5000 mg | Freq: Once | INTRAMUSCULAR | Status: AC
  Administered 2023-09-18: 0.5 mg via INTRAVENOUS
  Filled 2023-09-18: qty 0.5

## 2023-09-18 MED ORDER — IPRATROPIUM-ALBUTEROL 0.5-2.5 (3) MG/3ML IN SOLN
3.0000 mL | Freq: Once | RESPIRATORY_TRACT | Status: AC
  Administered 2023-09-18: 3 mL via RESPIRATORY_TRACT
  Filled 2023-09-18: qty 3

## 2023-09-18 NOTE — ED Triage Notes (Signed)
 Pt c/o of shortness of breath x3 months. Abdominal pain x1 week & states she has seen blood in her stool.

## 2023-09-18 NOTE — ED Provider Notes (Signed)
 [place] EMERGENCY DEPARTMENT AT [facility] Provider Note   CSN: [id number] Arrival date & time: 09/18/23  1147     History  Chief Complaint  Patient presents with   Abdominal Pain    [name] is a 63 y.o. female.  63 year old female with a history of cirrhosis, portal venous thrombus, polycythemia vera, and adrenal insufficiency who presents to the emergency department with abdominal pain, melena, cough, and shortness of breath.  Patient reports that over the past 3 months she has had intermittent shortness of breath.  Says it has gotten worse over the past few days.  Also has had a cough that she says is chronic.  No pain aside from in her abdomen.  Describes it as epigastric.  No clear exacerbating alleviating factors aside from coughing.  Says that she noticed some blood in her vomit in her stool.  Not on blood thinners.  Saw her primary doctor today who referred her into the emergency department       Home Medications Prior to Admission medications   Medication Sig Start Date End Date Taking? Authorizing Provider  diphenoxylate-atropine (LOMOTIL) 2.5-0.025 MG tablet Take 2 tablets by mouth 4 (four) times daily. 07/25/23  Yes [provider]  fluconazole  (DIFLUCAN ) 100 MG tablet Take 100 mg by mouth daily. 08/22/23  Yes [provider]  hydrochlorothiazide (MICROZIDE) 12.5 MG capsule Take 12.5 mg by mouth daily. 09/07/23  Yes [provider]  ondansetron  (ZOFRAN ) 4 MG tablet Take 4-8 mg by mouth every 6 (six) hours. 07/25/23  Yes [provider]  ALPRAZolam  (XANAX ) 1 MG tablet Take 1 mg by mouth 3 (three) times daily as needed for anxiety.    [provider]  amLODipine  (NORVASC ) 10 MG tablet Take 10 mg by mouth daily. 08/23/22   [provider]  baclofen  (LIORESAL ) 10 MG tablet Take 10 mg by mouth 3 (three) times daily. 06/07/22   [provider]  DULoxetine  (CYMBALTA ) 30 MG capsule Take 30 mg by mouth  daily. 01/09/21   [provider]  DULoxetine  (CYMBALTA ) 60 MG capsule Take 60 mg by mouth at bedtime.    [provider]  estradiol  (ESTRACE ) 1 MG tablet Take 1 mg by mouth daily.    [provider]  levothyroxine  (SYNTHROID , LEVOTHROID) 25 MCG tablet Take 25 mcg by mouth daily before breakfast. 10/30/16   [provider]  metoCLOPramide  (REGLAN ) 5 MG tablet Take 5 mg by mouth 4 (four) times daily. 08/13/22   [provider]  metoprolol  tartrate (LOPRESSOR ) 25 MG tablet Take 25 mg by mouth 2 (two) times daily.    [provider]  omeprazole (PRILOSEC) 20 MG capsule Take 20 mg by mouth at bedtime.     [provider]  ondansetron  (ZOFRAN -ODT) 4 MG disintegrating tablet Take 4-8 mg by mouth every 8 (eight) hours as needed for nausea or vomiting. 07/26/22   [provider]  oxycodone  (ROXICODONE ) 30 MG immediate release tablet Take 30 mg by mouth every 4 (four) hours as needed for pain. 02/09/21   [provider]  promethazine  (PHENERGAN ) 50 MG tablet Take 50 mg by mouth 2 (two) times daily as needed for nausea or vomiting. 07/14/21   [provider]  tiZANidine (ZANAFLEX) 4 MG tablet Take 4 mg by mouth every 6 (six) hours as needed for muscle spasms. 08/13/22   [provider]  venlafaxine  XR (EFFEXOR -XR) 75 MG 24 hr capsule Take 75 mg by mouth at bedtime. 04/30/22  [provider]  zolpidem (AMBIEN) 10 MG tablet Take 10 mg by mouth at bedtime.      [provider]      Allergies    Codeine, Flexeril  [cyclobenzaprine ], Latex, Lyrica [pregabalin], Nsaids, and Other    Review of Systems   Review of Systems  Physical Exam Updated Vital Signs BP 115/61   Pulse 66   Temp 98.8 F (37.1 C) (Oral)   Resp 18   Ht 5\' 6"  (1.676 m)   Wt 96.6 kg   SpO2 100%   BMI 34.38 kg/m  Physical Exam Vitals and nursing note reviewed.  Constitutional:      General: She is not in acute distress.     Appearance: She is well-developed.     Comments: Alert and oriented x 3  HENT:     Head: Normocephalic and atraumatic.     Right Ear: External ear normal.     Left Ear: External ear normal.     Nose: Nose normal.  Eyes:     Extraocular Movements: Extraocular movements intact.     Conjunctiva/sclera: Conjunctivae normal.     Pupils: Pupils are equal, round, and reactive to light.  Cardiovascular:     Rate and Rhythm: Normal rate and regular rhythm.     Heart sounds: No murmur heard. Pulmonary:     Effort: Pulmonary effort is normal. No respiratory distress.     Breath sounds: Wheezing present.  Abdominal:     General: Abdomen is flat. There is no distension.     Palpations: Abdomen is soft. There is no mass.     Tenderness: There is abdominal tenderness (Epigastrium). There is no guarding.  Musculoskeletal:     Cervical back: Normal range of motion and neck supple.     Right lower leg: No edema.     Left lower leg: No edema.  Skin:    General: Skin is warm and dry.  Neurological:     Mental Status: She is alert and oriented to person, place, and time. Mental status is at baseline.     Comments: No asterixis  Psychiatric:        Mood and Affect: Mood normal.     ED Results / Procedures / Treatments   Labs (all labs ordered are listed, but only abnormal results are displayed) Labs Reviewed  COMPREHENSIVE METABOLIC PANEL WITH GFR - Abnormal; Notable for the following components:      Result Value   BUN 28 (*)    Creatinine, Ser 1.31 (*)    Albumin 3.3 (*)    Alkaline Phosphatase 230 (*)    GFR, Estimated 46 (*)    All other components within normal limits  CBC WITH DIFFERENTIAL/PLATELET - Abnormal; Notable for the following components:   RBC 3.60 (*)    Hemoglobin 10.3 (*)    HCT 31.9 (*)    Eosinophils Absolute 0.7 (*)    All other components within normal limits  AMMONIA - Abnormal; Notable for the following components:   Ammonia 95 (*)    All other components  within normal limits  BRAIN NATRIURETIC PEPTIDE - Abnormal; Notable for the following components:   B Natriuretic Peptide 132.0 (*)    All other components within normal limits  BLOOD GAS, VENOUS - Abnormal; Notable for the following components:   pH, Ven 7.45 (*)    pO2, Ven 63 (*)    Bicarbonate 34.1 (*)    Acid-Base Excess 8.6 (*)    All other  components within normal limits  PROTIME-INR  LIPASE, BLOOD  TYPE AND SCREEN    EKG EKG Interpretation Date/Time:  Wednesday September 18 2023 12:00:40 EDT Ventricular Rate:  67 PR Interval:  202 QRS Duration:  88 QT Interval:  411 QTC Calculation: 434 R Axis:   52  Text Interpretation: Sinus rhythm Minimal ST elevation, inferior leads Confirmed by Shyrl Doyne [phone]) on 09/18/2023 2:00:05 PM  Radiology CT ABDOMEN PELVIS W CONTRAST Result Date: 09/18/2023 CLINICAL DATA:  Abdominal pain. EXAM: CT ABDOMEN AND PELVIS WITH CONTRAST TECHNIQUE: Multidetector CT imaging of the abdomen and pelvis was performed using the standard protocol following bolus administration of intravenous contrast. RADIATION DOSE REDUCTION: This exam was performed according to the departmental dose-optimization program which includes automated exposure control, adjustment of the mA and/or kV according to patient size and/or use of iterative reconstruction technique. CONTRAST:  80mL OMNIPAQUE  IOHEXOL  300 MG/ML  SOLN COMPARISON:  07/30/2021 FINDINGS: Lower chest: Lung bases clear.  No pericardial or pleural effusions. Hepatobiliary: Nodular contour of the liver consistent with cirrhosis. Unremarkable appearance of gallbladder. No biliary ductal dilatation. No focal hepatic parenchymal lesions. No evidence of portal vein thrombosis. Pancreas: Unremarkable. No pancreatic ductal dilatation or surrounding inflammatory changes. Spleen: Normal in size without focal abnormality. Adrenals/Urinary Tract: Adrenal glands are unremarkable. Kidneys are normal, without renal calculi, focal  lesion, or hydronephrosis. Bladder is unremarkable. Stomach/Bowel: Stomach is within normal limits. Appendix not visualized and no evidence of appendicitis. No evidence of bowel wall thickening, distention, or inflammatory changes. Vascular/Lymphatic: Aortic atherosclerosis. No enlarged abdominal or pelvic lymph nodes. Note is made of recanalization of the umbilical vein. No portal vein or splenic venous thrombosis. Reproductive: Status post hysterectomy. No adnexal masses. Other: No abdominal wall hernia or abnormality. No abdominopelvic ascites. Musculoskeletal: Mild grade 1 L5 retrolisthesis. Lumbosacral facet joint degenerative changes. No acute osseous abnormalities. IMPRESSION: 1. Cirrhosis. Recanalization of the umbilical vein. 2. No acute abdominal or pelvic pathology identified. 3. Aortic atherosclerosis. Electronically Signed   By: Sydell Eva M.D.   On: 09/18/2023 19:23   DG Chest 2 View Result Date: 09/18/2023 CLINICAL DATA:  sob EXAM: CHEST - 2 VIEW COMPARISON:  07/30/2021 FINDINGS: Cardiac silhouette is unremarkable. No pneumothorax or pleural effusion. The lungs are clear. The visualized skeletal structures are unremarkable. IMPRESSION: No acute cardiopulmonary process. Electronically Signed   By: Sydell Eva M.D.   On: 09/18/2023 18:54    Procedures Procedures    Medications Ordered in ED Medications  HYDROmorphone  (DILAUDID ) injection 0.5 mg (0.5 mg Intravenous Given 09/18/23 1422)  ipratropium-albuterol  (DUONEB) 0.5-2.5 (3) MG/3ML nebulizer solution 3 mL (3 mLs Nebulization Given 09/18/23 1428)  iohexol  (OMNIPAQUE ) 300 MG/ML solution 100 mL (80 mLs Intravenous Contrast Given 09/18/23 1505)    ED Course/ Medical Decision Making/ A&P Clinical Course as of 09/20/23 0752  Wed Sep 18, 2023  1506 Pt declined rectal exam. [RP]    Clinical Course User Index [RP] Ninetta Basket, MD                                 Medical Decision Making Amount and/or Complexity of Data  Reviewed Labs: ordered. Radiology: ordered.  Risk Prescription drug management.   CORDA SHUTT is a 63 y.o. female with comorbidities that complicate the patient evaluation including cirrhosis, portal venous thrombus, polycythemia vera, and adrenal insufficiency who presents to the emergency department with abdominal pain, melena, cough, and shortness of breath.  Initial Ddx:  GI bleed, cirrhosis, SBP, mesenteric thrombosis, COPD exacerbation, pneumonia  MDM/Course:  Patient presents emergency department with abdominal pain and hematemesis and bloody stools.  Also is having shortness of breath and cough.  On exam is satting well on room air.  Does have some expiratory wheezing.  Also has mild epigastric abdominal pain on her exam.  Patient declined rectal/Hemoccult exam.  Was given some nebulizer treatment.  Had lab work that was sent that shows that she has a hemoglobin of 10.3 today.  Her renal function is at baseline.  Her renal function is at baseline.  She was found to have a modestly elevated BNP at 132.  Her ammonia was elevated at 95 but she is not having any signs of hepatic encephalopathy currently.  She had a CT scan of her abdomen and a chest x-ray that were ordered.  After her imaging was performed the patient eloped.  Feel that she has capacity to make this decision at this time.  Unfortunately was not able to be located in the emergency department.  This patient presents to the ED for concern of complaints listed in HPI, this involves an extensive number of treatment options, and is a complaint that carries with it a high risk of complications and morbidity. Disposition including potential need for admission considered.   Dispo: DC Home. Return precautions discussed including, but not limited to, those listed in the AVS. Allowed pt time to ask questions which were answered fully prior to dc.  Additional history obtained from  PCP called prior to arrival Records reviewed  Outpatient Clinic Notes The following labs were independently interpreted: Chemistry and show CKD I independently reviewed the following imaging with scope of interpretation limited to determining acute life threatening conditions related to emergency care: Chest x-ray and agree with the radiologist interpretation with the following exceptions: none I personally reviewed and interpreted cardiac monitoring: normal sinus rhythm  I personally reviewed and interpreted the pt's EKG: see above for interpretation  I have reviewed the patients home medications and made adjustments as needed  Portions of this note were generated with Dragon dictation software. Dictation errors may occur despite best attempts at proofreading.    Final Clinical Impression(s) / ED Diagnoses Final diagnoses:  Gastrointestinal hemorrhage, unspecified gastrointestinal hemorrhage type  History of cirrhosis  Wheezing  History of COPD    Rx / DC Orders ED Discharge Orders     None         Ninetta Basket, MD 09/20/23 [id number]

## 2023-09-18 NOTE — ED Notes (Signed)
 Pt is refusing rectal exam for occult blood sample. EDP at bedside and explained that we would not know if she had an active GI Bleed without that, pt still refused.

## 2023-09-18 NOTE — ED Notes (Signed)
 Pt left for CT.

## 2023-09-18 NOTE — ED Notes (Addendum)
 Pt stated to this nurse, "you can discharge me. I am in pain. My oxycodone has worn off. I am very nauseous.I'll take this mess off myself." Edp has been notified.

## 2023-10-02 DIAGNOSIS — I4891 Unspecified atrial fibrillation: Secondary | ICD-10-CM | POA: Diagnosis not present

## 2023-10-02 DIAGNOSIS — D6869 Other thrombophilia: Secondary | ICD-10-CM | POA: Diagnosis not present

## 2023-10-02 DIAGNOSIS — E063 Autoimmune thyroiditis: Secondary | ICD-10-CM | POA: Diagnosis not present

## 2023-10-17 DIAGNOSIS — G894 Chronic pain syndrome: Secondary | ICD-10-CM | POA: Diagnosis not present

## 2023-10-17 DIAGNOSIS — E6609 Other obesity due to excess calories: Secondary | ICD-10-CM | POA: Diagnosis not present

## 2023-10-17 DIAGNOSIS — I10 Essential (primary) hypertension: Secondary | ICD-10-CM | POA: Diagnosis not present

## 2023-10-17 DIAGNOSIS — I130 Hypertensive heart and chronic kidney disease with heart failure and stage 1 through stage 4 chronic kidney disease, or unspecified chronic kidney disease: Secondary | ICD-10-CM | POA: Diagnosis not present

## 2023-10-17 DIAGNOSIS — E2749 Other adrenocortical insufficiency: Secondary | ICD-10-CM | POA: Diagnosis not present

## 2023-10-17 DIAGNOSIS — D518 Other vitamin B12 deficiency anemias: Secondary | ICD-10-CM | POA: Diagnosis not present

## 2023-10-17 DIAGNOSIS — I4891 Unspecified atrial fibrillation: Secondary | ICD-10-CM | POA: Diagnosis not present

## 2023-10-17 DIAGNOSIS — K7469 Other cirrhosis of liver: Secondary | ICD-10-CM | POA: Diagnosis not present

## 2023-10-17 DIAGNOSIS — F1120 Opioid dependence, uncomplicated: Secondary | ICD-10-CM | POA: Diagnosis not present

## 2023-10-17 DIAGNOSIS — Z6837 Body mass index (BMI) 37.0-37.9, adult: Secondary | ICD-10-CM | POA: Diagnosis not present

## 2023-10-17 DIAGNOSIS — D6869 Other thrombophilia: Secondary | ICD-10-CM | POA: Diagnosis not present

## 2023-10-17 DIAGNOSIS — F419 Anxiety disorder, unspecified: Secondary | ICD-10-CM | POA: Diagnosis not present

## 2023-11-02 DIAGNOSIS — F419 Anxiety disorder, unspecified: Secondary | ICD-10-CM | POA: Diagnosis not present

## 2023-11-02 DIAGNOSIS — I130 Hypertensive heart and chronic kidney disease with heart failure and stage 1 through stage 4 chronic kidney disease, or unspecified chronic kidney disease: Secondary | ICD-10-CM | POA: Diagnosis not present

## 2023-11-14 DIAGNOSIS — I4891 Unspecified atrial fibrillation: Secondary | ICD-10-CM | POA: Diagnosis not present

## 2023-11-14 DIAGNOSIS — I10 Essential (primary) hypertension: Secondary | ICD-10-CM | POA: Diagnosis not present

## 2023-11-14 DIAGNOSIS — Z6836 Body mass index (BMI) 36.0-36.9, adult: Secondary | ICD-10-CM | POA: Diagnosis not present

## 2023-11-14 DIAGNOSIS — I130 Hypertensive heart and chronic kidney disease with heart failure and stage 1 through stage 4 chronic kidney disease, or unspecified chronic kidney disease: Secondary | ICD-10-CM | POA: Diagnosis not present

## 2023-11-14 DIAGNOSIS — E6609 Other obesity due to excess calories: Secondary | ICD-10-CM | POA: Diagnosis not present

## 2023-11-14 DIAGNOSIS — F419 Anxiety disorder, unspecified: Secondary | ICD-10-CM | POA: Diagnosis not present

## 2023-11-14 DIAGNOSIS — M5136 Other intervertebral disc degeneration, lumbar region with discogenic back pain only: Secondary | ICD-10-CM | POA: Diagnosis not present

## 2023-11-14 DIAGNOSIS — G894 Chronic pain syndrome: Secondary | ICD-10-CM | POA: Diagnosis not present

## 2023-11-14 DIAGNOSIS — K7469 Other cirrhosis of liver: Secondary | ICD-10-CM | POA: Diagnosis not present

## 2023-11-14 DIAGNOSIS — F1120 Opioid dependence, uncomplicated: Secondary | ICD-10-CM | POA: Diagnosis not present

## 2023-11-14 DIAGNOSIS — E2749 Other adrenocortical insufficiency: Secondary | ICD-10-CM | POA: Diagnosis not present

## 2023-11-14 DIAGNOSIS — D45 Polycythemia vera: Secondary | ICD-10-CM | POA: Diagnosis not present

## 2024-01-02 DIAGNOSIS — F329 Major depressive disorder, single episode, unspecified: Secondary | ICD-10-CM | POA: Diagnosis not present

## 2024-01-02 DIAGNOSIS — F419 Anxiety disorder, unspecified: Secondary | ICD-10-CM | POA: Diagnosis not present

## 2024-01-02 DIAGNOSIS — F3181 Bipolar II disorder: Secondary | ICD-10-CM | POA: Diagnosis not present

## 2024-01-02 DIAGNOSIS — K589 Irritable bowel syndrome without diarrhea: Secondary | ICD-10-CM | POA: Diagnosis not present

## 2024-01-10 DIAGNOSIS — G894 Chronic pain syndrome: Secondary | ICD-10-CM | POA: Diagnosis not present

## 2024-01-10 DIAGNOSIS — K7469 Other cirrhosis of liver: Secondary | ICD-10-CM | POA: Diagnosis not present

## 2024-01-10 DIAGNOSIS — I130 Hypertensive heart and chronic kidney disease with heart failure and stage 1 through stage 4 chronic kidney disease, or unspecified chronic kidney disease: Secondary | ICD-10-CM | POA: Diagnosis not present

## 2024-01-10 DIAGNOSIS — F1120 Opioid dependence, uncomplicated: Secondary | ICD-10-CM | POA: Diagnosis not present

## 2024-01-10 DIAGNOSIS — I4891 Unspecified atrial fibrillation: Secondary | ICD-10-CM | POA: Diagnosis not present

## 2024-01-10 DIAGNOSIS — E6609 Other obesity due to excess calories: Secondary | ICD-10-CM | POA: Diagnosis not present

## 2024-01-10 DIAGNOSIS — D45 Polycythemia vera: Secondary | ICD-10-CM | POA: Diagnosis not present

## 2024-01-10 DIAGNOSIS — M5136 Other intervertebral disc degeneration, lumbar region with discogenic back pain only: Secondary | ICD-10-CM | POA: Diagnosis not present

## 2024-01-10 DIAGNOSIS — Z6835 Body mass index (BMI) 35.0-35.9, adult: Secondary | ICD-10-CM | POA: Diagnosis not present

## 2024-01-10 DIAGNOSIS — F419 Anxiety disorder, unspecified: Secondary | ICD-10-CM | POA: Diagnosis not present

## 2024-01-10 DIAGNOSIS — D6869 Other thrombophilia: Secondary | ICD-10-CM | POA: Diagnosis not present

## 2024-01-10 DIAGNOSIS — I10 Essential (primary) hypertension: Secondary | ICD-10-CM | POA: Diagnosis not present

## 2024-02-22 NOTE — Progress Notes (Unsigned)
 GI Office Note    Referring Provider: Bertell Satterfield, MD Primary Care Physician:  Bertell Satterfield, MD  Primary Gastroenterologist: Ozell Hollingshead, MD   Chief Complaint   No chief complaint on file.   History of Present Illness   [name] is a 63 y.o. female presenting today for follow up. Last seen 08/2022.H/O GERD, colon polyps. History of cirrhosis. Required paracentesis in 2018.  History of mildly elevated ASMA in the past but normal in 2020, recently mildly elevated again with titer 1:20.  She is immune to hepatitis B.  ANA previously negative, recently now positive.  AMA negative.  Chronically elevated alkaline phosphatase. Anti-GP-210 Ab neg. Anti-SP-100 Ab negative, GGT 209. Labs back in January showed low fasting a.m. cortisol level, referred to endocrinology for evaluation.  MELD sodium of 8. Recent alkaline phosphatase 309, AST 61, ALT 32, total bilirubin 0.6, AFP 3.9.    Prior Data   CT A/P with contrast 09/2023: -cirrhosis. Recanalization of umbilical vein   MRI of the lumbar spine in September 2023 there appears to be an enlarging fatty but heterogeneous mass within the posterior paraspinous muscles on the left, measuring up to 3 x 2.4 cm in diameter presently compared with 1.8 x 1.3 cm on prior exam which was preoperative disease 2017). REMOVAL OF LIPOMA 11/2022   Abdominal ultrasound 05/23/2022: IMPRESSION: 1. No acute abnormality identified. 2. Nodular contour of the liver, consistent with cirrhosis. There is probable recanalized umbilical vein.   EGD January 2024:  -Gastric mucosal changes consistent with portal hypertensive gastropathy -Gastric biopsy showed mild nonspecific reactive gastropathy, gastric oxyntic mucosa with parietal cell hyperplasia as can be seen in hypergastrinemia state such as PPI therapy.  No H. pylori.   Attempted colonoscopy January 2024: -Preparation of the colon was inadequate.  Formed stool balls found in the rectum  traveling up to the sigmoid colon.  8 mm polyp seen in the rectosigmoid colon, not removed. -Hemorrhoids found on perianal exam.  Colonoscopy 09/2022: -prep of colon inadequate   Medications   Current Outpatient Medications  Medication Sig Dispense Refill   ALPRAZolam  (XANAX ) 1 MG tablet Take 1 mg by mouth 3 (three) times daily as needed for anxiety.     amLODipine  (NORVASC ) 10 MG tablet Take 10 mg by mouth daily.     baclofen  (LIORESAL ) 10 MG tablet Take 10 mg by mouth 3 (three) times daily.     diphenoxylate-atropine (LOMOTIL) 2.5-0.025 MG tablet Take 2 tablets by mouth 4 (four) times daily.     DULoxetine  (CYMBALTA ) 30 MG capsule Take 30 mg by mouth daily.     DULoxetine  (CYMBALTA ) 60 MG capsule Take 60 mg by mouth at bedtime.     estradiol  (ESTRACE ) 1 MG tablet Take 1 mg by mouth daily.     fluconazole  (DIFLUCAN ) 100 MG tablet Take 100 mg by mouth daily.     hydrochlorothiazide (MICROZIDE) 12.5 MG capsule Take 12.5 mg by mouth daily.     levothyroxine  (SYNTHROID , LEVOTHROID) 25 MCG tablet Take 25 mcg by mouth daily before breakfast.     metoCLOPramide  (REGLAN ) 5 MG tablet Take 5 mg by mouth 4 (four) times daily.     metoprolol  tartrate (LOPRESSOR ) 25 MG tablet Take 25 mg by mouth 2 (two) times daily.     omeprazole (PRILOSEC) 20 MG capsule Take 20 mg by mouth at bedtime.      ondansetron  (ZOFRAN ) 4 MG tablet Take 4-8 mg by mouth every 6 (six) hours.  ondansetron  (ZOFRAN -ODT) 4 MG disintegrating tablet Take 4-8 mg by mouth every 8 (eight) hours as needed for nausea or vomiting.     oxycodone  (ROXICODONE ) 30 MG immediate release tablet Take 30 mg by mouth every 4 (four) hours as needed for pain.     promethazine  (PHENERGAN ) 50 MG tablet Take 50 mg by mouth 2 (two) times daily as needed for nausea or vomiting.     tiZANidine (ZANAFLEX) 4 MG tablet Take 4 mg by mouth every 6 (six) hours as needed for muscle spasms.     venlafaxine  XR (EFFEXOR -XR) 75 MG 24 hr capsule Take 75 mg by mouth  at bedtime.     zolpidem (AMBIEN) 10 MG tablet Take 10 mg by mouth at bedtime.       No current facility-administered medications for this visit.    Allergies   Allergies as of 02/24/2024 - Review Complete 09/18/2023  Allergen Reaction Noted   Codeine Hives and Other (See Comments) 03/03/2008   Flexeril  [cyclobenzaprine ]  11/21/2022   Latex Hives and Itching 02/23/2011   Lyrica [pregabalin] Hives 11/20/2016   Nsaids Nausea And Vomiting 11/20/2016   Other Hives and Itching 02/23/2013     Past Medical History   Past Medical History:  Diagnosis Date   Anemia    Anxiety    Asthma    Cirrhosis (HCC)    Cirrhosis (HCC)    COPD (chronic obstructive pulmonary disease) (HCC)    Depression    Disc degeneration, lumbar    GERD (gastroesophageal reflux disease)    Hypertension    Hypothyroidism    Leukocytosis    Lumbar disc disease    Portal hypertension (HCC)    Shortness of breath    Thrombocytosis     Past Surgical History   Past Surgical History:  Procedure Laterality Date   ABDOMINAL HYSTERECTOMY     APPENDECTOMY     BACK SURGERY     BIOPSY  03/25/2014   Procedure: GASTRIC BIOPSIES;  Surgeon: Lamar CHRISTELLA Hollingshead, MD;  Location: [facility];  Service: Endoscopy;;   COLONOSCOPY  02/21/2007   MFM:Jwjo papilla and internal hemorrhoids, diminutive rectal polyp/Left-sided diverticula (sigmoid) pedunculated polyps mid descending   COLONOSCOPY  06/04/2006   Dr. Hollingshead: segmental biopsy negative for microscopic colitis. Three polyps, one tubular adenoma. Surveillance due 2013.    COLONOSCOPY WITH PROPOFOL  N/A 03/25/2014   RMR: Rectal and colonic polyps removed as described above.  Colonic diverticulosis. CT abnormality likely artifactual in orgin     COLONOSCOPY WITH PROPOFOL  N/A 07/04/2022   Procedure: COLONOSCOPY WITH PROPOFOL ;  Surgeon: Hollingshead Lamar CHRISTELLA, MD;  Location: [facility];  Service: Endoscopy;  Laterality: N/A;  10:15 am   ESOPHAGOGASTRODUODENOSCOPY  08/08/2005    WLM:Wnmfjo esophagogastroduodenoscopy.   ESOPHAGOGASTRODUODENOSCOPY  06/04/2009   Dr. 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 Abnormal gastic mucosa of uncertain significance-status post gasrtric biopsy   ESOPHAGOGASTRODUODENOSCOPY (EGD) WITH PROPOFOL  N/A 07/04/2022   Procedure: ESOPHAGOGASTRODUODENOSCOPY (EGD) WITH PROPOFOL ;  Surgeon: Hollingshead Lamar CHRISTELLA, MD;  Location: [facility];  Service: Endoscopy;  Laterality: N/A;   FLEXIBLE SIGMOIDOSCOPY N/A 09/05/2022   Procedure: FLEXIBLE SIGMOIDOSCOPY;  Surgeon: Hollingshead Lamar CHRISTELLA, MD;  Location: [facility];  Service: Endoscopy;  Laterality: N/A;   LIPOMA EXCISION N/A 11/23/2022   Procedure: Removal of lipoma, L5 paraspinous soft tissue;  Surgeon: Gillie,  Rockey, MD;  Location: [facility];  Service: Neurosurgery;  Laterality: N/A;  RM 21 3C   MALONEY DILATION N/A 03/25/2014   Procedure: MALONEY ESOPHAGEAL DILATION #54 Fr;  Surgeon: Lamar CHRISTELLA Hollingshead, MD;  Location: [facility];  Service: Endoscopy;  Laterality: N/A;   POLYPECTOMY  03/25/2014   Procedure: SIGMOID AND RECTAL POLYPECTOMY;  Surgeon: Lamar CHRISTELLA Hollingshead, MD;  Location: [facility];  Service: Endoscopy;;   right knee surgery     rt breast biopsy     TOTAL ABDOMINAL HYSTERECTOMY W/ BILATERAL SALPINGOOPHORECTOMY     age 80    Past Family History   Family History  Adopted: Yes  Problem Relation Age of Onset   Pancreatic cancer Mother    Leukemia Sister        1/2 sister   Hepatitis C Brother    Liver cancer Brother        suicide   Colon cancer Brother        73   Cirrhosis Brother        etoh use   Pancreatic cancer Paternal Grandmother    Kidney cancer Paternal Grandfather    Pancreatic cancer Maternal Aunt    Kidney cancer Maternal Uncle    Pancreatic cancer Maternal Uncle    Colon cancer  Paternal Aunt    Breast cancer Neg Hx     Past Social History   Social History   Socioeconomic History   Marital status: Single    Spouse name: Not on file   Number of children: Not on file   Years of education: Not on file   Highest education level: Not on file  Occupational History   Not on file  Tobacco Use   Smoking status: Every Day    Current packs/day: 0.50    Average packs/day: 0.5 packs/day for 30.0 years (15.0 ttl pk-yrs)    Types: Cigarettes   Smokeless tobacco: Never  Vaping Use   Vaping status: Never Used  Substance and Sexual Activity   Alcohol use: No    Comment: see HPI. Patient denies alcohol use at present/past.   Drug use: No   Sexual activity: Not Currently  Other Topics Concern   Not on file  Social History Narrative   Not on file   Social Drivers of Health   Financial Resource Strain: Not on file  Food Insecurity: Not on file  Transportation Needs: Not on file  Physical Activity: Not on file  Stress: Not on file  Social Connections: Not on file  Intimate Partner Violence: Not on file    Review of Systems   General: Negative for anorexia, weight loss, fever, chills, fatigue, weakness. ENT: Negative for hoarseness, difficulty swallowing , nasal congestion. CV: Negative for chest pain, angina, palpitations, dyspnea on exertion, peripheral edema.  Respiratory: Negative for dyspnea at rest, dyspnea on exertion, cough, sputum, wheezing.  GI: See history of present illness. GU:  Negative for dysuria, hematuria, urinary incontinence, urinary frequency, nocturnal urination.  Endo: Negative for unusual weight change.     Physical Exam   There were no vitals taken for this visit.   General: Well-nourished, well-developed in no acute distress.  Eyes: No icterus. Mouth: Oropharyngeal mucosa moist and pink   Lungs: Clear to auscultation bilaterally.  Heart: Regular rate and rhythm, no murmurs rubs or gallops.  Abdomen: Bowel sounds are normal,  nontender, nondistended, no hepatosplenomegaly or masses,  no abdominal bruits or hernia , no rebound or guarding.  Rectal: not performed Extremities: No  lower extremity edema. No clubbing or deformities. Neuro: Alert and oriented x 4   Skin: Warm and dry, no jaundice.   Psych: Alert and cooperative, normal mood and affect.  Labs   Lab Results  Component Value Date   LIPASE 28 09/18/2023   Lab Results  Component Value Date   NA 137 09/18/2023   CL 98 09/18/2023   K 4.1 09/18/2023   CO2 26 09/18/2023   BUN 28 (H) 09/18/2023   CREATININE 1.31 (H) 09/18/2023   GFRNONAA 46 (L) 09/18/2023   CALCIUM 9.9 09/18/2023   PHOS 2.9 02/10/2021   ALBUMIN 3.3 (L) 09/18/2023   GLUCOSE 95 09/18/2023   Lab Results  Component Value Date   ALT 15 09/18/2023   AST 25 09/18/2023   ALKPHOS 230 (H) 09/18/2023   BILITOT 0.3 09/18/2023   Lab Results  Component Value Date   WBC 7.2 09/18/2023   HGB 10.3 (L) 09/18/2023   HCT 31.9 (L) 09/18/2023   MCV 88.6 09/18/2023   PLT 280 09/18/2023   Lab Results  Component Value Date   INR 1.1 09/18/2023   INR 1.1 09/03/2022   INR 1.1 06/08/2022      Imaging Studies   No results found.  Assessment/Plan:           Sonny RAMAN. Ezzard, MHS, PA-C [facility]

## 2024-02-24 ENCOUNTER — Ambulatory Visit: Admitting: Gastroenterology

## 2024-02-24 ENCOUNTER — Telehealth: Payer: Self-pay | Admitting: Gastroenterology

## 2024-02-24 ENCOUNTER — Other Ambulatory Visit: Payer: Self-pay | Admitting: *Deleted

## 2024-02-24 ENCOUNTER — Encounter: Payer: Self-pay | Admitting: Gastroenterology

## 2024-02-24 ENCOUNTER — Encounter: Payer: Self-pay | Admitting: *Deleted

## 2024-02-24 VITALS — BP 118/81 | HR 86 | Temp 98.0°F | Ht 66.0 in | Wt 213.8 lb

## 2024-02-24 DIAGNOSIS — R7989 Other specified abnormal findings of blood chemistry: Secondary | ICD-10-CM | POA: Diagnosis not present

## 2024-02-24 DIAGNOSIS — R1310 Dysphagia, unspecified: Secondary | ICD-10-CM | POA: Diagnosis not present

## 2024-02-24 DIAGNOSIS — K219 Gastro-esophageal reflux disease without esophagitis: Secondary | ICD-10-CM | POA: Diagnosis not present

## 2024-02-24 DIAGNOSIS — K920 Hematemesis: Secondary | ICD-10-CM

## 2024-02-24 DIAGNOSIS — K7460 Unspecified cirrhosis of liver: Secondary | ICD-10-CM

## 2024-02-24 DIAGNOSIS — R1011 Right upper quadrant pain: Secondary | ICD-10-CM

## 2024-02-24 DIAGNOSIS — R1319 Other dysphagia: Secondary | ICD-10-CM

## 2024-02-24 DIAGNOSIS — Z800 Family history of malignant neoplasm of digestive organs: Secondary | ICD-10-CM | POA: Insufficient documentation

## 2024-02-24 DIAGNOSIS — D649 Anemia, unspecified: Secondary | ICD-10-CM | POA: Diagnosis not present

## 2024-02-24 DIAGNOSIS — K625 Hemorrhage of anus and rectum: Secondary | ICD-10-CM | POA: Insufficient documentation

## 2024-02-24 DIAGNOSIS — Z860100 Personal history of colon polyps, unspecified: Secondary | ICD-10-CM

## 2024-02-24 DIAGNOSIS — Z860101 Personal history of adenomatous and serrated colon polyps: Secondary | ICD-10-CM | POA: Diagnosis not present

## 2024-02-24 MED ORDER — PANTOPRAZOLE SODIUM 40 MG PO TBEC: 40.0000 mg | DELAYED_RELEASE_TABLET | Freq: Two times a day (BID) | ORAL | 3 refills | Status: DC

## 2024-02-24 NOTE — Telephone Encounter (Signed)
 Patient seen today in the office.   Patient needs to be scheduled for colonoscopy/EGD with possible esophageal dilation, with possible variceal banding. I don't anticipate need for variceal banding but I would want her to be consented in case. ASA 3. Room 3.Dr. Shaaron.  She will need extended bowel prep. -no lomotil or other diarrhea medications starting five days before -start bisacodyl 10mg  daily for 3 days before procedures -two full days clear liquids -two days before, miralax  prep -one day before, short volume prep (not trilyte ), all the day before -she needs someone to help keep her from sleepwalking/eating while asleep like before   Tammy C: I noticed after patient left, she never saw endocrinology for the very low am cortisol level last year. Can we have her complete AM cortisol level at 8am (nothing to eat or drink after midnight the night before testing). Hopefully she has not already went for labs today.

## 2024-02-24 NOTE — Patient Instructions (Addendum)
 We will get your scheduled for a colonoscopy and upper endoscopy in near future. If appropriate, Dr. Shaaron will stretch your esophagus at the same time.   Please complete labs at [facility].   We will make referral to [facility] Liver specialist per your request.   Start pantoprazole  40mg  before breakfast and before supper. This will take the place of omeprazole.   Cut back on salt intake. This will help your swelling. You should consume no more than 2000mg  of salt/sodium daily. Check your food labels and keep track of your sodium intake.    Two Gram Sodium Diet 2000 mg  What is Sodium? Sodium is a mineral found naturally in many foods. The most significant source of sodium in the diet is table salt, which is about 40% sodium.  Processed, convenience, and preserved foods also contain a large amount of sodium.  The body needs only 500 mg of sodium daily to function,  A normal diet provides more than enough sodium even if you do not use salt.  Why Limit Sodium? A build up of sodium in the body can cause thirst, increased blood pressure, shortness of breath, and water  retention.  Decreasing sodium in the diet can reduce edema and risk of heart attack or stroke associated with high blood pressure.  Keep in mind that there are many other factors involved in these health problems.  Heredity, obesity, lack of exercise, cigarette smoking, stress and what you eat all play a role.  General Guidelines: Do not add salt at the table or in cooking.  One teaspoon of salt contains over 2 grams of sodium. Read food labels Avoid processed and convenience foods Ask your dietitian before eating any foods not dicussed in the menu planning guidelines Consult your physician if you wish to use a salt substitute or a sodium containing medication such as antacids.  Limit milk and milk products to 16 oz (2 cups) per day.  Shopping Hints: READ LABELS!! Dietetic does not necessarily mean low sodium. Salt and other sodium  ingredients are often added to foods during processing.    Menu Planning Guidelines Food Group Choose More Often Avoid  Beverages (see also the milk group All fruit juices, low-sodium, salt-free vegetables juices, low-sodium carbonated beverages Regular vegetable or tomato juices, commercially softened water  used for drinking or cooking  Breads and Cereals Enriched white, wheat, rye and pumpernickel bread, hard rolls and dinner rolls; muffins, cornbread and waffles; most dry cereals, cooked cereal without added salt; unsalted crackers and breadsticks; low sodium or homemade bread crumbs Bread, rolls and crackers with salted tops; quick breads; instant hot cereals; pancakes; commercial bread stuffing; self-rising flower and biscuit mixes; regular bread crumbs or cracker crumbs  Desserts and Sweets Desserts and sweets mad with mild should be within allowance Instant pudding mixes and cake mixes  Fats Butter or margarine; vegetable oils; unsalted salad dressings, regular salad dressings limited to 1 Tbs; light, sour and heavy cream Regular salad dressings containing bacon fat, bacon bits, and salt pork; snack dips made with instant soup mixes or processed cheese; salted nuts  Fruits Most fresh, frozen and canned fruits Fruits processed with salt or sodium-containing ingredient (some dried fruits are processed with sodium sulfites        Vegetables Fresh, frozen vegetables and low- sodium canned vegetables Regular canned vegetables, sauerkraut, pickled vegetables, and others prepared in brine; frozen vegetables in sauces; vegetables seasoned with ham, bacon or salt pork  Condiments, Sauces, Miscellaneous  Salt substitute with physician's approval;  pepper, herbs, spices; vinegar, lemon or lime juice; hot pepper sauce; garlic powder, onion powder, low sodium soy sauce (1 Tbs.); low sodium condiments (ketchup, chili sauce, mustard) in limited amounts (1 tsp.) fresh ground horseradish; unsalted tortilla  chips, pretzels, potato chips, popcorn, salsa (1/4 cup) Any seasoning made with salt including garlic salt, celery salt, onion salt, and seasoned salt; sea salt, rock salt, kosher salt; meat tenderizers; monosodium glutamate; mustard, regular soy sauce, barbecue, sauce, chili sauce, teriyaki sauce, steak sauce, Worcestershire sauce, and most flavored vinegars; canned gravy and mixes; regular condiments; salted snack foods, olives, picles, relish, horseradish sauce, catsup   Food preparation: Try these seasonings Meats:    Pork Sage, onion Serve with applesauce  Chicken Poultry seasoning, thyme, parsley Serve with cranberry sauce  Lamb Curry powder, rosemary, garlic, thyme Serve with mint sauce or jelly  Veal Marjoram, basil Serve with current jelly, cranberry sauce  Beef Pepper, bay leaf Serve with dry mustard, unsalted chive butter  Fish [place] leaf, dill Serve with unsalted lemon butter, unsalted parsley butter  Vegetables:    Asparagus Lemon juice   Broccoli Lemon juice   Carrots Mustard dressing parsley, mint, nutmeg, glazed with unsalted butter and sugar   Green beans Marjoram, lemon juice, nutmeg,dill seed   Tomatoes Basil, marjoram, onion   Spice /blend for [employer]  4 tsp ground thyme 1 tsp ground sage 3 tsp ground rosemary 4 tsp ground marjoram   Test your knowledge A product that says Salt Free may still contain sodium. True or False Garlic Powder and Hot Pepper Sauce an be used as alternative seasonings.True or False Processed foods have more sodium than fresh foods.  True or False Canned Vegetables have less sodium than froze True or False   WAYS TO DECREASE YOUR SODIUM INTAKE Avoid the use of added salt in cooking and at the table.  Table salt (and other prepared seasonings which contain salt) is probably one of the greatest sources of sodium in the diet.  Unsalted foods can gain flavor from the sweet, sour, and butter taste sensations of herbs and spices.  Instead of  using salt for seasoning, try the following seasonings with the foods listed.  Remember: how you use them to enhance natural food flavors is limited only by your creativity... Allspice-Meat, fish, eggs, fruit, peas, red and yellow vegetables Almond Extract-Fruit baked goods Anise Seed-Sweet breads, fruit, carrots, beets, cottage cheese, cookies (tastes like licorice) Basil-Meat, fish, eggs, vegetables, rice, vegetables salads, soups, sauces Bay Leaf-Meat, fish, stews, poultry Burnet-Salad, vegetables (cucumber-like flavor) Caraway Seed-Bread, cookies, cottage cheese, meat, vegetables, cheese, rice Cardamon-Baked goods, fruit, soups Celery Powder or seed-Salads, salad dressings, sauces, meatloaf, soup, bread.Do not use  celery salt Chervil-Meats, salads, fish, eggs, vegetables, cottage cheese (parsley-like flavor) Chili Power-Meatloaf, chicken cheese, corn, eggplant, egg dishes Chives-Salads cottage cheese, egg dishes, soups, vegetables, sauces Cilantro-Salsa, casseroles Cinnamon-Baked goods, fruit, pork, lamb, chicken, carrots Cloves-Fruit, baked goods, fish, pot roast, green beans, beets, carrots Coriander-Pastry, cookies, meat, salads, cheese (lemon-orange flavor) Cumin-Meatloaf, fish,cheese, eggs, cabbage,fruit pie (caraway flavor) [employer], fruit, eggs, fish, poultry, cottage cheese, vegetables Dill Seed-Meat, cottage cheese, poultry, vegetables, fish, salads, bread Fennel Seed-Bread, cookies, apples, pork, eggs, fish, beets, cabbage, cheese, Licorice-like flavor Garlic-(buds or powder) Salads, meat, poultry, fish, bread, butter, vegetables, potatoes.Do not  use garlic salt Ginger-Fruit, vegetables, baked goods, meat, fish, poultry Horseradish Root-Meet, vegetables, butter Lemon Juice or Extract-Vegetables, fruit, tea, baked goods, fish salads Mace-Baked goods fruit, vegetables, fish, poultry (taste like nutmeg) Maple Extract-Syrups Marjoram-Meat,  chicken, fish, vegetables,  breads, green salads (taste like Sage) Mint-Tea, lamb, sherbet, vegetables, desserts, carrots, cabbage Mustard, Dry or Seed-Cheese, eggs, meats, vegetables, poultry Nutmeg-Baked goods, fruit, chicken, eggs, vegetables, desserts Onion Powder-Meat, fish, poultry, vegetables, cheese, eggs, bread, rice salads (Do not use   Onion salt) Orange Extract-Desserts, baked goods Oregano-Pasta, eggs, cheese, onions, pork, lamb, fish, chicken, vegetables, green salads Paprika-Meat, fish, poultry, eggs, cheese, vegetables Parsley Flakes-Butter, vegetables, meat fish, poultry, eggs, bread, salads (certain forms may   Contain sodium Pepper-Meat fish, poultry, vegetables, eggs Peppermint Extract-Desserts, baked goods Poppy Seed-Eggs, bread, cheese, fruit dressings, baked goods, noodles, vegetables, cottage  [employer], poultry, meat, fish, cauliflower, turnips,eggs bread Saffron-Rice, bread, veal, chicken, fish, eggs Sage-Meat, fish, poultry, onions, eggplant, tomateos, pork, stews Savory-Eggs, salads, poultry, meat, rice, vegetables, soups, pork Tarragon-Meat, poultry, fish, eggs, butter, vegetables (licorice-like flavor)  Thyme-Meat, poultry, fish, eggs, vegetables, (clover-like flavor), sauces, soups Tumeric-Salads, butter, eggs, fish, rice, vegetables (saffron-like flavor) Vanilla Extract-Baked goods, candy Vinegar-Salads, vegetables, meat marinades Walnut Extract-baked goods, candy   2. Choose your Foods Wisely   The following is a list of foods to avoid which are high in sodium:  Meats-Avoid all smoked, canned, salt cured, dried and kosher meat and fish as well as Anchovies   [employer] meats:Bologna, Liverwurst, Pastrami Canned meat or fish  Marinated herring Caviar    Pepperoni Corned Beef   Pizza Dried chipped beef  Salami Frozen breaded fish or meat Salt pork Frankfurters or hot dogs  Sardines Gefilte fish   Sausage Ham (boiled ham,  Proscuitto Smoked butt    spiced ham)   Spam      TV Dinners Vegetables Canned vegetables (Regular) Relish Canned mushrooms  Sauerkraut Olives    Tomato juice Pickles  Bakery and Dessert Products Canned puddings  Cream pies Cheesecake   Decorated cakes Cookies  Beverages/Juices Tomato juice, regular  Gatorade   V-8 vegetable juice, regular  Breads and Cereals Biscuit mixes   Salted potato chips, corn chips, pretzels Bread stuffing mixes  Salted crackers and rolls Pancake and waffle mixes Self-rising flour  Seasonings Accent    Meat sauces Barbecue sauce  Meat tenderizer Catsup    Monosodium glutamate (MSG) Celery salt   Onion salt Chili sauce   Prepared mustard Garlic salt   Salt, seasoned salt, sea salt Gravy mixes   Soy sauce Horseradish   Steak sauce Ketchup   Tartar sauce Lite salt    Teriyaki sauce Marinade mixes   Worcestershire sauce  Others Baking powder   Cocoa and cocoa mixes Baking soda   Commercial casserole mixes Candy-caramels, chocolate  Dehydrated soups    Bars, fudge,nougats  Instant rice and pasta mixes Canned broth or soup  Maraschino cherries Cheese, aged and processed cheese and cheese spreads  Learning Assessment Quiz  Indicated T (for True) or F (for False) for each of the following statements:  _____ Fresh fruits and vegetables and unprocessed grains are generally low in sodium _____ Water  may contain a considerable amount of sodium, depending on the source _____ You can always tell if a food is high in sodium by tasting it _____ Certain laxatives my be high in sodium and should be avoided unless prescribed   by a physician or pharmacist _____ Salt substitutes may be used freely by anyone on a sodium restricted diet _____ Sodium is present in table salt, food additives and as a natural component of   most foods _____ Table salt is  approximately 90% sodium _____ Limiting sodium intake may help prevent excess fluid accumulation in the  body _____ On a sodium-restricted diet, seasonings such as bouillon soy sauce, and    cooking wine should be used in place of table salt _____ On an ingredient list, a product which lists monosodium glutamate as the first   ingredient is an appropriate food to include on a low sodium diet  Circle the best answer(s) to the following statements (Hint: there may be more than one correct answer)  11. On a low-sodium diet, some acceptable snack items are:    A. Olives  F. Bean dip   K. Grapefruit juice    B. Salted Pretzels G. Commercial Popcorn   L. Canned peaches    C. Carrot Sticks  H. Bouillon   M. Unsalted nuts   D. [place] fries  I. Peanut butter crackers N. Salami   E. Sweet pickles J. Tomato Juice   O. Pizza  12.  Seasonings that may be used freely on a reduced - sodium diet include   A. Lemon wedges F.Monosodium glutamate K. Celery seed    B.Soysauce   G. Pepper   L. Mustard powder   C. Sea salt  H. Cooking wine  M. Onion flakes   D. Vinegar  E. Prepared horseradish N. Salsa   E. Sage   J. Worcestershire sauce  O. Chutney

## 2024-02-25 ENCOUNTER — Other Ambulatory Visit: Payer: Self-pay | Admitting: *Deleted

## 2024-02-25 ENCOUNTER — Encounter: Payer: Self-pay | Admitting: *Deleted

## 2024-02-25 MED ORDER — CLENPIQ 10-3.5-12 MG-GM -GM/175ML PO SOLN: 1.0000 | ORAL | Status: DC

## 2024-02-25 NOTE — Progress Notes (Signed)
 Medication Samples have been provided to the patient.  Drug name: Clenpiq        Strength: 10 mg/3.5 g/12 g per 175 ml per bottle        Qty: 1   LOT: [id number]  Exp.Date: 12/01/2024  Dosing instructions: AS DIRECTED  The patient has been instructed regarding the correct time, dose, and frequency of taking this medication, including desired effects and most common side effects.   Madelin Ponto 12:36 PM 02/25/2024

## 2024-02-25 NOTE — Addendum Note (Signed)
 Addended by: WELLINGTON MILLING on: 02/25/2024 10:59 AM   Modules accepted: Orders

## 2024-02-25 NOTE — Telephone Encounter (Signed)
 Pt has been scheduled for 03/04/24, instructions and prep placed up front for pt to pick up tomorrow. Pt states she will have someone to help her with her sleepwalking/eating .

## 2024-02-25 NOTE — Telephone Encounter (Signed)
 This message did not get routed to me until today. Pt was made aware and verbalized understanding. Pt had not been to lab yet, I resubmitted the labs and added on the am cortisol and instructed the pt to go to lab on Thurs morning at 8am to have it along with the others done and to be npo after midnight the night before. Pt verbalized understanding.

## 2024-02-26 ENCOUNTER — Ambulatory Visit (HOSPITAL_COMMUNITY): Attending: Gastroenterology

## 2024-02-27 ENCOUNTER — Encounter (HOSPITAL_COMMUNITY)
Admission: RE | Admit: 2024-02-27 | Discharge: 2024-02-27 | Disposition: A | Discharge status: Home / Self Care | Source: Ambulatory Visit | Attending: Internal Medicine | Admitting: Internal Medicine

## 2024-02-27 NOTE — Pre-Procedure Instructions (Signed)
 Attempted pre-p phone call. VM is full and I could not leave a message.

## 2024-02-28 NOTE — Pre-Procedure Instructions (Signed)
 Attempted pre-op phone call. VM remains full and I could not leave a message.

## 2024-03-02 NOTE — Pre-Procedure Instructions (Signed)
 Attempted pre-op phone call. VM is full and could not leave a message.

## 2024-03-02 NOTE — Pre-Procedure Instructions (Signed)
 Left VM for her daughter, Rosina Marina to call us  back or have patient call us  back.

## 2024-03-03 ENCOUNTER — Encounter (HOSPITAL_COMMUNITY): Payer: Self-pay

## 2024-03-03 ENCOUNTER — Other Ambulatory Visit: Payer: Self-pay

## 2024-03-03 NOTE — Telephone Encounter (Signed)
 Pt informed of US  scheduled for 03/11/24, arrive at 9:15 am to check in at [facility], NPO after midnight.+

## 2024-03-04 ENCOUNTER — Ambulatory Visit (HOSPITAL_COMMUNITY): Admitting: Anesthesiology

## 2024-03-04 ENCOUNTER — Ambulatory Visit (HOSPITAL_BASED_OUTPATIENT_CLINIC_OR_DEPARTMENT_OTHER): Admitting: Anesthesiology

## 2024-03-04 ENCOUNTER — Encounter (HOSPITAL_COMMUNITY)
Admission: RE | Disposition: A | Discharge status: Home / Self Care | Payer: Self-pay | Source: Home / Self Care | Attending: Internal Medicine

## 2024-03-04 ENCOUNTER — Ambulatory Visit (HOSPITAL_COMMUNITY)
Admission: RE | Admit: 2024-03-04 | Discharge: 2024-03-04 | Disposition: A | Discharge status: Home / Self Care | Attending: Internal Medicine | Admitting: Internal Medicine

## 2024-03-04 ENCOUNTER — Encounter (HOSPITAL_COMMUNITY): Payer: Self-pay | Admitting: Internal Medicine

## 2024-03-04 DIAGNOSIS — Z860100 Personal history of colon polyps, unspecified: Secondary | ICD-10-CM | POA: Diagnosis not present

## 2024-03-04 DIAGNOSIS — D123 Benign neoplasm of transverse colon: Secondary | ICD-10-CM

## 2024-03-04 DIAGNOSIS — K3189 Other diseases of stomach and duodenum: Secondary | ICD-10-CM

## 2024-03-04 DIAGNOSIS — K7460 Unspecified cirrhosis of liver: Secondary | ICD-10-CM | POA: Diagnosis not present

## 2024-03-04 DIAGNOSIS — N183 Chronic kidney disease, stage 3 unspecified: Secondary | ICD-10-CM | POA: Diagnosis not present

## 2024-03-04 DIAGNOSIS — Z1211 Encounter for screening for malignant neoplasm of colon: Secondary | ICD-10-CM

## 2024-03-04 DIAGNOSIS — F17200 Nicotine dependence, unspecified, uncomplicated: Secondary | ICD-10-CM | POA: Insufficient documentation

## 2024-03-04 DIAGNOSIS — J4489 Other specified chronic obstructive pulmonary disease: Secondary | ICD-10-CM | POA: Diagnosis not present

## 2024-03-04 DIAGNOSIS — E039 Hypothyroidism, unspecified: Secondary | ICD-10-CM | POA: Insufficient documentation

## 2024-03-04 DIAGNOSIS — K635 Polyp of colon: Secondary | ICD-10-CM | POA: Diagnosis not present

## 2024-03-04 DIAGNOSIS — I10 Essential (primary) hypertension: Secondary | ICD-10-CM

## 2024-03-04 DIAGNOSIS — K219 Gastro-esophageal reflux disease without esophagitis: Secondary | ICD-10-CM | POA: Diagnosis not present

## 2024-03-04 DIAGNOSIS — K766 Portal hypertension: Secondary | ICD-10-CM

## 2024-03-04 DIAGNOSIS — F17210 Nicotine dependence, cigarettes, uncomplicated: Secondary | ICD-10-CM

## 2024-03-04 DIAGNOSIS — Z09 Encounter for follow-up examination after completed treatment for conditions other than malignant neoplasm: Secondary | ICD-10-CM | POA: Diagnosis not present

## 2024-03-04 DIAGNOSIS — I129 Hypertensive chronic kidney disease with stage 1 through stage 4 chronic kidney disease, or unspecified chronic kidney disease: Secondary | ICD-10-CM | POA: Insufficient documentation

## 2024-03-04 DIAGNOSIS — R1310 Dysphagia, unspecified: Secondary | ICD-10-CM | POA: Diagnosis not present

## 2024-03-04 HISTORY — PX: ESOPHAGEAL DILATION: SHX303

## 2024-03-04 HISTORY — PX: COLONOSCOPY: SHX5424

## 2024-03-04 HISTORY — PX: ESOPHAGOGASTRODUODENOSCOPY: SHX5428

## 2024-03-04 SURGERY — COLONOSCOPY
Anesthesia: General

## 2024-03-04 MED ORDER — LIDOCAINE 2% (20 MG/ML) 5 ML SYRINGE
INTRAMUSCULAR | Status: DC | PRN
  Administered 2024-03-04: 50 mg via INTRAVENOUS

## 2024-03-04 MED ORDER — STERILE WATER FOR IRRIGATION IR SOLN
Status: DC | PRN
  Administered 2024-03-04: 50 mL

## 2024-03-04 MED ORDER — PROPOFOL 500 MG/50ML IV EMUL
INTRAVENOUS | Status: DC | PRN
  Administered 2024-03-04: 150 ug/kg/min via INTRAVENOUS

## 2024-03-04 MED ORDER — PROPOFOL 10 MG/ML IV BOLUS
INTRAVENOUS | Status: DC | PRN
  Administered 2024-03-04: 100 mg via INTRAVENOUS
  Administered 2024-03-04: 50 mg via INTRAVENOUS
  Administered 2024-03-04: 100 mg via INTRAVENOUS
  Administered 2024-03-04 (×3): 50 mg via INTRAVENOUS

## 2024-03-04 MED ORDER — LACTATED RINGERS IV SOLN: INTRAVENOUS | Status: DC

## 2024-03-04 NOTE — Anesthesia Postprocedure Evaluation (Signed)
 Anesthesia Post Note  Patient: [name]  Procedure(s) Performed: COLONOSCOPY EGD (ESOPHAGOGASTRODUODENOSCOPY) DILATION, ESOPHAGUS  Patient location during evaluation: Phase II Anesthesia Type: General Level of consciousness: awake and alert Pain management: pain level controlled Vital Signs Assessment: post-procedure vital signs reviewed and stable Respiratory status: spontaneous breathing, nonlabored ventilation and respiratory function stable Cardiovascular status: stable Anesthetic complications: no   There were no known notable events for this encounter.   Last Vitals:  Vitals:   03/04/24 0914 03/04/24 1218  BP: (!) 146/80 (!) 144/74  Pulse:  89  Resp: 14 (!) 25  Temp: 36.9 C 36.5 C  SpO2: 96% 100%    Last Pain:  Vitals:   03/04/24 1218  TempSrc: Oral  PainSc: 7                  Gevork Ayyad L Biridiana Twardowski

## 2024-03-04 NOTE — H&P (Signed)
 @LOGO @   Gastroenterology Progress Note    Primary Care Physician:  Pcp, No Primary Gastroenterologist:  Dr. Tyra  Pre-Procedure History & Physical: HPI:  [name] is a 63 y.o. female here for  further evaluation of longstanding GERD and now with esophageal dysphagia also she is back for surveillance/high risk screening colonoscopy poor prep last year thwarted complete, high-quality colonoscopy.  Past Medical History:  Diagnosis Date   Anemia    Anxiety    Asthma    Cirrhosis (HCC)    Cirrhosis (HCC)    COPD (chronic obstructive pulmonary disease) (HCC)    Depression    Disc degeneration, lumbar    GERD (gastroesophageal reflux disease)    Hypertension    Hypothyroidism    Leukocytosis    Lumbar disc disease    Portal hypertension (HCC)    Shortness of breath    Thrombocytosis     Past Surgical History:  Procedure Laterality Date   ABDOMINAL HYSTERECTOMY     APPENDECTOMY     BACK SURGERY     BIOPSY  03/25/2014   Procedure: GASTRIC BIOPSIES;  Surgeon: Lamar CHRISTELLA Hollingshead, MD;  Location: [facility];  Service: Endoscopy;;   COLONOSCOPY  02/21/2007   MFM:Jwjo papilla and internal hemorrhoids, diminutive rectal polyp/Left-sided diverticula (sigmoid) pedunculated polyps mid descending   COLONOSCOPY  06/04/2006   Dr. Hollingshead: segmental biopsy negative for microscopic colitis. Three polyps, one tubular adenoma. Surveillance due 2013.    COLONOSCOPY WITH PROPOFOL  N/A 03/25/2014   RMR: Rectal and colonic polyps removed as described above.  Colonic diverticulosis. CT abnormality likely artifactual in orgin     COLONOSCOPY WITH PROPOFOL  N/A 07/04/2022   Procedure: COLONOSCOPY WITH PROPOFOL ;  Surgeon: Hollingshead Lamar CHRISTELLA, MD;  Location: [facility];  Service: Endoscopy;  Laterality: N/A;  10:15 am   ESOPHAGOGASTRODUODENOSCOPY  08/08/2005   WLM:Wnmfjo esophagogastroduodenoscopy.   ESOPHAGOGASTRODUODENOSCOPY  06/04/2009   Dr. Hollingshead: normal esophagus, small hiatal hernia, duodenal  diverticulum, negative H.pylori and celiac   ESOPHAGOGASTRODUODENOSCOPY (EGD) WITH PROPOFOL  N/A 03/25/2014   RMR: Probable occult cervical esophageal web- status post dilation as described above. Abnormal gastic mucosa of uncertain significance-status post gasrtric biopsy   ESOPHAGOGASTRODUODENOSCOPY (EGD) WITH PROPOFOL  N/A 07/04/2022   Procedure: ESOPHAGOGASTRODUODENOSCOPY (EGD) WITH PROPOFOL ;  Surgeon: Hollingshead Lamar CHRISTELLA, MD;  Location: [facility];  Service: Endoscopy;  Laterality: N/A;   FLEXIBLE SIGMOIDOSCOPY N/A 09/05/2022   Procedure: FLEXIBLE SIGMOIDOSCOPY;  Surgeon: Hollingshead Lamar CHRISTELLA, MD;  Location: [facility];  Service: Endoscopy;  Laterality: N/A;   LIPOMA EXCISION N/A 11/23/2022   Procedure: Removal of lipoma, L5 paraspinous soft tissue;  Surgeon: Gillie Duncans, MD;  Location: [facility];  Service: Neurosurgery;  Laterality: N/A;  RM 21 3C   MALONEY DILATION N/A 03/25/2014   Procedure: MALONEY ESOPHAGEAL DILATION #54 Fr;  Surgeon: Lamar CHRISTELLA Hollingshead, MD;  Location: [facility];  Service: Endoscopy;  Laterality: N/A;   POLYPECTOMY  03/25/2014   Procedure: SIGMOID AND RECTAL POLYPECTOMY;  Surgeon: Lamar CHRISTELLA Hollingshead, MD;  Location: [facility];  Service: Endoscopy;;   right knee surgery     rt breast biopsy     TOTAL ABDOMINAL HYSTERECTOMY W/ BILATERAL SALPINGOOPHORECTOMY     age 38    Prior to Admission medications   Medication Sig Start Date End Date Taking? Authorizing Provider  ALPRAZolam  (XANAX ) 1 MG tablet Take 1 mg by mouth 3 (three) times daily as needed for anxiety.   Yes [provider]  amLODipine  (NORVASC ) 10 MG tablet Take  10 mg by mouth daily. 08/23/22  Yes [provider]  diphenoxylate-atropine (LOMOTIL) 2.5-0.025 MG tablet Take 2 tablets by mouth 4 (four) times daily. Patient taking differently: Take 2 tablets by mouth 4 (four) times daily as needed. 07/25/23  Yes [provider]  DULoxetine  (CYMBALTA ) 30 MG capsule Take 30 mg by mouth daily. 01/09/21  Yes  [provider]  DULoxetine  (CYMBALTA ) 60 MG capsule Take 60 mg by mouth at bedtime.   Yes [provider]  estradiol  (ESTRACE ) 1 MG tablet Take 1 mg by mouth daily.   Yes [provider]  hydrochlorothiazide (MICROZIDE) 12.5 MG capsule Take 12.5 mg by mouth daily. 09/07/23  Yes [provider]  levothyroxine  (SYNTHROID , LEVOTHROID) 25 MCG tablet Take 25 mcg by mouth daily before breakfast. 10/30/16  Yes [provider]  metoCLOPramide  (REGLAN ) 5 MG tablet Take 5 mg by mouth 4 (four) times daily. 08/13/22  Yes [provider]  metoprolol  tartrate (LOPRESSOR ) 25 MG tablet Take 25 mg by mouth 2 (two) times daily.   Yes [provider]  oxycodone  (ROXICODONE ) 30 MG immediate release tablet Take 30 mg by mouth every 4 (four) hours as needed for pain. 02/09/21  Yes [provider]  pantoprazole  (PROTONIX ) 40 MG tablet Take 1 tablet (40 mg total) by mouth 2 (two) times daily before a meal. 02/24/24  Yes Ezzard Sonny RAMAN, PA-C  promethazine  (PHENERGAN ) 25 MG tablet Take 25 mg by mouth 4 (four) times daily as needed. 02/11/24  Yes [provider]  promethazine  (PHENERGAN ) 50 MG tablet Take 50 mg by mouth 2 (two) times daily as needed for nausea or vomiting. 07/14/21  Yes [provider]  Sod Picosulfate-Mag Ox-Cit Acd (CLENPIQ ) 10-3.5-12 MG-GM -GM/175ML SOLN Take 1 kit by mouth as directed. 02/25/24  Yes Michaeljoseph Revolorio, Lamar HERO, MD  tiZANidine (ZANAFLEX) 4 MG tablet Take 4 mg by mouth every 6 (six) hours as needed for muscle spasms. 08/13/22  Yes [provider]  venlafaxine  XR (EFFEXOR -XR) 75 MG 24 hr capsule Take 75 mg by mouth at bedtime. 04/30/22  Yes [provider]  zolpidem (AMBIEN) 10 MG tablet Take 10 mg by mouth at bedtime.     Yes [provider]    Allergies as of 02/25/2024 - Review Complete 02/24/2024  Allergen Reaction Noted   Codeine Hives and Other (See Comments) 03/03/2008   Flexeril   [cyclobenzaprine ]  11/21/2022   Latex Hives and Itching 02/23/2011   Lyrica [pregabalin] Hives 11/20/2016   Nsaids Nausea And Vomiting 11/20/2016   Other Hives and Itching 02/23/2013    Family History  Adopted: Yes  Problem Relation Age of Onset   Pancreatic cancer Mother    Leukemia Sister        1/2 sister   Hepatitis C Brother    Liver cancer Brother        suicide   Colon cancer Brother        99   Cirrhosis Brother        etoh use   Pancreatic cancer Paternal Grandmother    Kidney cancer Paternal Grandfather    Pancreatic cancer Maternal Aunt    Kidney cancer Maternal Uncle    Pancreatic cancer Maternal Uncle    Colon cancer Paternal Aunt    Breast cancer Neg Hx     Social History   Socioeconomic History   Marital status: Single    Spouse name: Not on file   Number of children: Not on file   Years  of education: Not on file   Highest education level: Not on file  Occupational History   Not on file  Tobacco Use   Smoking status: Every Day    Current packs/day: 0.50    Average packs/day: 0.5 packs/day for 30.0 years (15.0 ttl pk-yrs)    Types: Cigarettes   Smokeless tobacco: Never  Vaping Use   Vaping status: Never Used  Substance and Sexual Activity   Alcohol use: No    Comment: see HPI. Patient denies alcohol use at present/past.   Drug use: No   Sexual activity: Not Currently  Other Topics Concern   Not on file  Social History Narrative   Not on file   Social Drivers of Health   Financial Resource Strain: Not on file  Food Insecurity: Not on file  Transportation Needs: Not on file  Physical Activity: Not on file  Stress: Not on file  Social Connections: Not on file  Intimate Partner Violence: Not on file    Review of Systems   See HPI, otherwise negative ROS  Physical Exam: BP (!) 146/80 (BP Location: Right Arm)   Temp 98.5 F (36.9 C)   Resp 14   SpO2 96%  General:   Alert,  Well-developed, well-nourished, pleasant and cooperative in  NAD Neck:  Supple; no masses or thyromegaly. No significant cervical adenopathy. Lungs:  Clear throughout to auscultation.   No wheezes, crackles, or rhonchi. No acute distress. Heart:  Regular rate and rhythm; no murmurs, clicks, rubs,  or gallops. Abdomen: Non-distended, normal bowel sounds.  Soft and nontender without appreciable mass or hepatosplenomegaly.    Impression/Plan:     63 year old lady with now esophageal dysphagia setting of chronic GERD also positive family history colon cancer and a personal history colonic polyps here for surveillance/high rescreening colonoscopy.  The risks, benefits, limitations, imponderables and alternatives regarding both EGD and colonoscopy have been reviewed with the patient. Questions have been answered. All parties agreeable.      Notice: This dictation was prepared with Dragon dictation along with smaller phrase technology. Any transcriptional errors that result from this process are unintentional and may not be corrected upon review.

## 2024-03-04 NOTE — Anesthesia Preprocedure Evaluation (Addendum)
 Anesthesia Evaluation  Patient identified by MRN, date of birth, ID band Patient awake    Reviewed: Allergy & Precautions, H&P , NPO status , Patient's Chart, lab work & pertinent test results, reviewed documented beta blocker date and time   Airway Mallampati: II  TM Distance: >3 FB Neck ROM: full    Dental  (+) Dental Advisory Given, Edentulous Upper, Missing, Poor Dentition Missing many lower teeth and the rest are rotten:   Pulmonary shortness of breath, asthma , COPD,  COPD inhaler, Current Smoker and Patient abstained from smoking.   Pulmonary exam normal breath sounds clear to auscultation       Cardiovascular Exercise Tolerance: Good hypertension, Normal cardiovascular exam Rhythm:regular Rate:Normal     Neuro/Psych  Headaches PSYCHIATRIC DISORDERS Anxiety Depression     Neuromuscular disease    GI/Hepatic ,GERD  ,,(+) Cirrhosis       Colon cancer   Endo/Other  Hypothyroidism    Renal/GU CRFRenal diseaseStage 3 CKD  negative genitourinary   Musculoskeletal   Abdominal   Peds  Hematology  (+) Blood dyscrasia, anemia   Anesthesia Other Findings   Reproductive/Obstetrics negative OB ROS                              Anesthesia Physical Anesthesia Plan  ASA: 3  Anesthesia Plan: General   Post-op Pain Management: Minimal or no pain anticipated   Induction: Intravenous  PONV Risk Score and Plan: Propofol  infusion  Airway Management Planned: Natural Airway and Nasal Cannula  Additional Equipment: None  Intra-op Plan:   Post-operative Plan:   Informed Consent: I have reviewed the patients History and Physical, chart, labs and discussed the procedure including the risks, benefits and alternatives for the proposed anesthesia with the patient or authorized representative who has indicated his/her understanding and acceptance.     Dental Advisory Given  Plan Discussed with:  CRNA  Anesthesia Plan Comments:         Anesthesia Quick Evaluation

## 2024-03-04 NOTE — Discharge Instructions (Addendum)
 EGD Discharge instructions Please read the instructions outlined below and refer to this sheet in the next few weeks. These discharge instructions provide you with general information on caring for yourself after you leave the hospital. Your doctor may also give you specific instructions. While your treatment has been planned according to the most current medical practices available, unavoidable complications occasionally occur. If you have any problems or questions after discharge, please call your doctor. ACTIVITY You may resume your regular activity but move at a slower pace for the next 24 hours.  Take frequent rest periods for the next 24 hours.  Walking will help expel (get rid of) the air and reduce the bloated feeling in your abdomen.  No driving for 24 hours (because of the anesthesia (medicine) used during the test).  You may shower.  Do not sign any important legal documents or operate any machinery for 24 hours (because of the anesthesia used during the test).  NUTRITION Drink plenty of fluids.  You may resume your normal diet.  Begin with a light meal and progress to your normal diet.  Avoid alcoholic beverages for 24 hours or as instructed by your caregiver.  MEDICATIONS You may resume your normal medications unless your caregiver tells you otherwise.  WHAT YOU CAN EXPECT TODAY You may experience abdominal discomfort such as a feeling of fullness or "gas" pains.  FOLLOW-UP Your doctor will discuss the results of your test with you.  SEEK IMMEDIATE MEDICAL ATTENTION IF ANY OF THE FOLLOWING OCCUR: Excessive nausea (feeling sick to your stomach) and/or vomiting.  Severe abdominal pain and distention (swelling).  Trouble swallowing.  Temperature over 101 F (37.8 C).  Rectal bleeding or vomiting of blood.    Colonoscopy Discharge Instructions  Read the instructions outlined below and refer to this sheet in the next few weeks. These discharge instructions provide you with  general information on caring for yourself after you leave the hospital. Your doctor may also give you specific instructions. While your treatment has been planned according to the most current medical practices available, unavoidable complications occasionally occur. If you have any problems or questions after discharge, call Dr. Shaaron at [phone]. ACTIVITY You may resume your regular activity, but move at a slower pace for the next 24 hours.  Take frequent rest periods for the next 24 hours.  Walking will help get rid of the air and reduce the bloated feeling in your belly (abdomen).  No driving for 24 hours (because of the medicine (anesthesia) used during the test).   Do not sign any important legal documents or operate any machinery for 24 hours (because of the anesthesia used during the test).  NUTRITION Drink plenty of fluids.  You may resume your normal diet as instructed by your doctor.  Begin with a light meal and progress to your normal diet. Heavy or fried foods are harder to digest and may make you feel sick to your stomach (nauseated).  Avoid alcoholic beverages for 24 hours or as instructed.  MEDICATIONS You may resume your normal medications unless your doctor tells you otherwise.  WHAT YOU CAN EXPECT TODAY Some feelings of bloating in the abdomen.  Passage of more gas than usual.  Spotting of blood in your stool or on the toilet paper.  IF YOU HAD POLYPS REMOVED DURING THE COLONOSCOPY: No aspirin  products for 7 days or as instructed.  No alcohol for 7 days or as instructed.  Eat a soft diet for the next 24 hours.  FINDING  OUT THE RESULTS OF YOUR TEST Not all test results are available during your visit. If your test results are not back during the visit, make an appointment with your caregiver to find out the results. Do not assume everything is normal if you have not heard from your caregiver or the medical facility. It is important for you to follow up on all of your test  results.  SEEK IMMEDIATE MEDICAL ATTENTION IF: You have more than a spotting of blood in your stool.  Your belly is swollen (abdominal distention).  You are nauseated or vomiting.  You have a temperature over 101.  You have abdominal pain or discomfort that is severe or gets worse throughout the day.      your esophagus was stretched today  2 small polyps removed from your colon.  Your prep was adequate today  Further recommendations to follow pending review of pathology report  Office visit with us  in 6 months  At patient request, I called Rosina Marina at [phone]-[email or web address] findings and recommendations

## 2024-03-04 NOTE — Transfer of Care (Signed)
 Immediate Anesthesia Transfer of Care Note  Patient: [name]  Procedure(s) Performed: COLONOSCOPY EGD (ESOPHAGOGASTRODUODENOSCOPY) DILATION, ESOPHAGUS  Patient Location: Short Stay  Anesthesia Type:General  Level of Consciousness: awake  Airway & Oxygen Therapy: Patient Spontanous Breathing  Post-op Assessment: Report given to RN  Post vital signs: Reviewed and stable  Last Vitals:  Vitals Value Taken Time  BP    Temp    Pulse    Resp    SpO2      Last Pain:  Vitals:   03/04/24 1137  PainSc: 8       Patients Stated Pain Goal: 7 (03/04/24 0913)  Complications: No notable events documented.

## 2024-03-04 NOTE — Op Note (Signed)
 [facility] Patient Name: [name] Procedure Date: 03/04/2024 11:24 AM MRN: [id number] Date of Birth: 06-26-1960 Attending MD: Lamar Ozell Hollingshead , MD, [id number] CSN: [id number] Age: 63 Admit Type: Outpatient Procedure:                Upper GI endoscopy Indications:              Dysphagia Providers:                Lamar Ozell Hollingshead, MD, Jon LABOR. Gerome RN, RN,                            Leandrew Edelman RN, RN Referring MD:              Medicines:                Propofol  per Anesthesia Complications:            No immediate complications. Estimated Blood Loss:     Estimated blood loss was minimal. Procedure:                Pre-Anesthesia Assessment:                           - Prior to the procedure, a History and Physical                            was performed, and patient medications and                            allergies were reviewed. The patient's tolerance of                            previous anesthesia was also reviewed. The risks                            and benefits of the procedure and the sedation                            options and risks were discussed with the patient.                            All questions were answered, and informed consent                            was obtained. Prior Anticoagulants: The patient has                            taken no anticoagulant or antiplatelet agents. ASA                            Grade Assessment: III - A patient with severe                            systemic disease. After reviewing the risks and  benefits, the patient was deemed in satisfactory                            condition to undergo the procedure.                           After obtaining informed consent, the endoscope was                            passed under direct vision. Throughout the                            procedure, the patient's blood pressure, pulse, and                            oxygen  saturations were monitored continuously. The                            [id number] ([id number] was introduced through the                            mouth, and advanced to the second part of duodenum.                            The upper GI endoscopy was accomplished without                            difficulty. The patient tolerated the procedure                            well. Scope In: 11:41:31 AM Scope Out: 11:46:31 AM Total Procedure Duration: 0 hours 5 minutes 0 seconds  Findings:      The examined esophagus was normal.      Portal hypertensive gastropathy was found in the entire examined       stomach. No gastric varices. Patent pylorus.      The duodenal bulb and second portion of the duodenum were normal. The       scope was withdrawn. Dilation was performed with a Maloney dilator with       mild resistance at 54 Fr. The dilation site was examined following       endoscope reinsertion and showed mild mucosal disruption. Estimated       blood loss was minimal. Impression:               - Normal esophagus. Dilated.                           - Portal hypertensive gastropathy.                           - Normal duodenal bulb and second portion of the                            duodenum.                           -  No specimens collected. Moderate Sedation:      Moderate (conscious) sedation was personally administered by an       anesthesia professional. The following parameters were monitored: oxygen       saturation, heart rate, blood pressure, respiratory rate, EKG, adequacy       of pulmonary ventilation, and response to care. Recommendation:           - Patient has a contact number available for                            emergencies. The signs and symptoms of potential                            delayed complications were discussed with the                            patient. Return to normal activities tomorrow.                            Written discharge instructions  were provided to the                            patient.                           - Advance diet as tolerated.                           - Continue present medications. Continue once daily                            PPI. Office visit in 6 months. Timing of repeat                            endoscopy to be determined. See colonoscopy report Procedure Code(s):        --- Professional ---                           [phone], Esophagogastroduodenoscopy, flexible,                            transoral; diagnostic, including collection of                            specimen(s) by brushing or washing, when performed                            (separate procedure)                           43450, Dilation of esophagus, by unguided sound or                            bougie, single or multiple passes Diagnosis Code(s):        --- Professional ---  K76.6, Portal hypertension                           K31.89, Other diseases of stomach and duodenum                           R13.10, Dysphagia, unspecified CPT copyright 2022 [employer]. All rights reserved. The codes documented in this report are preliminary and upon coder review may  be revised to meet current compliance requirements. Lamar HERO. Deigo Alonso, MD Lamar Ozell Hollingshead, MD 03/04/2024 11:51:42 AM This report has been signed electronically. Number of Addenda: 0

## 2024-03-04 NOTE — Op Note (Signed)
 [facility] Patient Name: [name] Procedure Date: 03/04/2024 11:23 AM MRN: [id number] Date of Birth: Feb 01, 1961 Attending MD: Lamar Ozell Hollingshead , MD, [id number] CSN: [id number] Age: 63 Admit Type: Outpatient Procedure:                Colonoscopy Indications:              High risk colon cancer surveillance: Personal                            history of colonic polyps Providers:                Lamar Ozell Hollingshead, MD, Jon LABOR. Gerome RN, RN,                            Leandrew Edelman RN, RN Referring MD:              Medicines:                Propofol  per Anesthesia Complications:            No immediate complications. Estimated Blood Loss:     Estimated blood loss was minimal. Procedure:                Pre-Anesthesia Assessment:                           - Prior to the procedure, a History and Physical                            was performed, and patient medications and                            allergies were reviewed. The patient's tolerance of                            previous anesthesia was also reviewed. The risks                            and benefits of the procedure and the sedation                            options and risks were discussed with the patient.                            All questions were answered, and informed consent                            was obtained. Prior Anticoagulants: The patient has                            taken no anticoagulant or antiplatelet agents. ASA                            Grade Assessment: III - A patient with severe  systemic disease. After reviewing the risks and                            benefits, the patient was deemed in satisfactory                            condition to undergo the procedure.                           After obtaining informed consent, the colonoscope                            was passed under direct vision. Throughout the                            procedure,  the patient's blood pressure, pulse, and                            oxygen saturations were monitored continuously. The                            [id number] ([id number]) Colon was introduced through                            the anus and advanced to the the cecum, identified                            by appendiceal orifice and ileocecal valve. The                            colonoscopy was performed without difficulty. The                            patient tolerated the procedure well. The quality                            of the bowel preparation was adequate. The                            ileocecal valve, appendiceal orifice, and rectum                            were photographed. Scope In: 11:52:56 AM Scope Out: 12:11:16 PM Scope Withdrawal Time: 0 hours 8 minutes 12 seconds  Total Procedure Duration: 0 hours 18 minutes 20 seconds  Findings:      The perianal and digital rectal examinations were normal.      Two sessile polyps were found in the descending colon and hepatic       flexure. The polyps were 3 to 4 mm in size. These polyps were removed       with a cold snare. Resection and retrieval were complete. Estimated       blood loss was minimal.      The exam was otherwise without abnormality. Rectum seen well on?"face.       Rectal vault  too small to retroflex. Impression:               - Two 3 to 4 mm polyps in the descending colon and                            at the hepatic flexure, removed with a cold snare.                            Resected and retrieved.                           - The examination was otherwise normal. Moderate Sedation:      Moderate (conscious) sedation was personally administered by an       anesthesia professional. The following parameters were monitored: oxygen       saturation, heart rate, blood pressure, respiratory rate, EKG, adequacy       of pulmonary ventilation, and response to care. Recommendation:           - Patient has a contact  number available for                            emergencies. The signs and symptoms of potential                            delayed complications were discussed with the                            patient. Return to normal activities tomorrow.                            Written discharge instructions were provided to the                            patient.                           - Advance diet as tolerated. Follow-up on                            pathology. Office visit with us  in 6 months. See                            EGD report. Procedure Code(s):        --- Professional ---                           [phone], Colonoscopy, flexible; with removal of                            tumor(s), polyp(s), or other lesion(s) by snare                            technique Diagnosis Code(s):        --- Professional ---  Z86.010, Personal history of colonic polyps                           D12.4, Benign neoplasm of descending colon                           D12.3, Benign neoplasm of transverse colon (hepatic                            flexure or splenic flexure) CPT copyright 2022 American [employer] Association. All rights reserved. The codes documented in this report are preliminary and upon coder review may  be revised to meet current compliance requirements. Lamar HERO. Theodore Rahrig, MD Lamar Ozell Hollingshead, MD 03/04/2024 12:17:08 PM This report has been signed electronically. Number of Addenda: 0

## 2024-03-05 ENCOUNTER — Encounter (HOSPITAL_COMMUNITY): Payer: Self-pay | Admitting: Internal Medicine

## 2024-03-05 ENCOUNTER — Ambulatory Visit: Payer: Self-pay | Admitting: Internal Medicine

## 2024-03-05 LAB — SURGICAL PATHOLOGY

## 2024-03-11 ENCOUNTER — Ambulatory Visit (HOSPITAL_COMMUNITY): Attending: Gastroenterology

## 2024-03-18 ENCOUNTER — Emergency Department (HOSPITAL_COMMUNITY)
Admission: EM | Admit: 2024-03-18 | Discharge: 2024-03-19 | Disposition: A | Discharge status: Other Acute Inpatient Hospital

## 2024-03-18 ENCOUNTER — Encounter (HOSPITAL_COMMUNITY): Payer: Self-pay | Admitting: Emergency Medicine

## 2024-03-18 ENCOUNTER — Other Ambulatory Visit: Payer: Self-pay

## 2024-03-18 DIAGNOSIS — R1111 Vomiting without nausea: Secondary | ICD-10-CM | POA: Diagnosis not present

## 2024-03-18 DIAGNOSIS — Z8659 Personal history of other mental and behavioral disorders: Secondary | ICD-10-CM | POA: Insufficient documentation

## 2024-03-18 DIAGNOSIS — F29 Unspecified psychosis not due to a substance or known physiological condition: Secondary | ICD-10-CM | POA: Diagnosis not present

## 2024-03-18 DIAGNOSIS — Z91040 Latex allergy status: Secondary | ICD-10-CM | POA: Insufficient documentation

## 2024-03-18 DIAGNOSIS — Z79899 Other long term (current) drug therapy: Secondary | ICD-10-CM | POA: Insufficient documentation

## 2024-03-18 DIAGNOSIS — R531 Weakness: Secondary | ICD-10-CM | POA: Diagnosis not present

## 2024-03-18 DIAGNOSIS — F32A Depression, unspecified: Secondary | ICD-10-CM | POA: Diagnosis present

## 2024-03-18 DIAGNOSIS — R197 Diarrhea, unspecified: Secondary | ICD-10-CM | POA: Diagnosis not present

## 2024-03-18 DIAGNOSIS — F329 Major depressive disorder, single episode, unspecified: Secondary | ICD-10-CM | POA: Insufficient documentation

## 2024-03-18 DIAGNOSIS — R45851 Suicidal ideations: Secondary | ICD-10-CM | POA: Insufficient documentation

## 2024-03-18 DIAGNOSIS — E876 Hypokalemia: Secondary | ICD-10-CM | POA: Diagnosis not present

## 2024-03-18 DIAGNOSIS — K7682 Hepatic encephalopathy: Secondary | ICD-10-CM | POA: Diagnosis not present

## 2024-03-18 DIAGNOSIS — F209 Schizophrenia, unspecified: Secondary | ICD-10-CM | POA: Diagnosis not present

## 2024-03-18 DIAGNOSIS — I959 Hypotension, unspecified: Secondary | ICD-10-CM | POA: Diagnosis not present

## 2024-03-18 DIAGNOSIS — R4182 Altered mental status, unspecified: Secondary | ICD-10-CM | POA: Diagnosis not present

## 2024-03-18 DIAGNOSIS — M549 Dorsalgia, unspecified: Secondary | ICD-10-CM | POA: Diagnosis not present

## 2024-03-18 LAB — CBC WITH DIFFERENTIAL/PLATELET
Abs Immature Granulocytes: 0.05 K/uL (ref 0.00–0.07)
Basophils Absolute: 0.2 K/uL — ABNORMAL HIGH (ref 0.0–0.1)
Basophils Relative: 1 %
Eosinophils Absolute: 0.4 K/uL (ref 0.0–0.5)
Eosinophils Relative: 3 %
HCT: 37.7 % (ref 36.0–46.0)
Hemoglobin: 12.6 g/dL (ref 12.0–15.0)
Immature Granulocytes: 0 %
Lymphocytes Relative: 24 %
Lymphs Abs: 2.9 K/uL (ref 0.7–4.0)
MCH: 29 pg (ref 26.0–34.0)
MCHC: 33.4 g/dL (ref 30.0–36.0)
MCV: 86.7 fL (ref 80.0–100.0)
Monocytes Absolute: 1.1 K/uL — ABNORMAL HIGH (ref 0.1–1.0)
Monocytes Relative: 9 %
Neutro Abs: 7.5 K/uL (ref 1.7–7.7)
Neutrophils Relative %: 63 %
Platelets: 332 K/uL (ref 150–400)
RBC: 4.35 MIL/uL (ref 3.87–5.11)
RDW: 14.6 % (ref 11.5–15.5)
WBC: 12.1 K/uL — ABNORMAL HIGH (ref 4.0–10.5)
nRBC: 0 % (ref 0.0–0.2)

## 2024-03-18 LAB — URINALYSIS, ROUTINE W REFLEX MICROSCOPIC
Bilirubin Urine: NEGATIVE
Glucose, UA: NEGATIVE mg/dL
Hgb urine dipstick: NEGATIVE
Ketones, ur: NEGATIVE mg/dL
Leukocytes,Ua: NEGATIVE
Nitrite: NEGATIVE
Protein, ur: 100 mg/dL — AB
Specific Gravity, Urine: 1.016 (ref 1.005–1.030)
pH: 5 (ref 5.0–8.0)

## 2024-03-18 LAB — URINE DRUG SCREEN
Amphetamines: NEGATIVE
Barbiturates: NEGATIVE
Benzodiazepines: POSITIVE — AB
Cocaine: NEGATIVE
Fentanyl: NEGATIVE
Methadone Scn, Ur: NEGATIVE
Opiates: NEGATIVE
Tetrahydrocannabinol: NEGATIVE

## 2024-03-18 LAB — BASIC METABOLIC PANEL WITH GFR
Anion gap: 14 (ref 5–15)
BUN: 18 mg/dL (ref 8–23)
CO2: 22 mmol/L (ref 22–32)
Calcium: 9.2 mg/dL (ref 8.9–10.3)
Chloride: 105 mmol/L (ref 98–111)
Creatinine, Ser: 1.87 mg/dL — ABNORMAL HIGH (ref 0.44–1.00)
GFR, Estimated: 30 mL/min — ABNORMAL LOW
Glucose, Bld: 116 mg/dL — ABNORMAL HIGH (ref 70–99)
Potassium: 2.7 mmol/L — CL (ref 3.5–5.1)
Sodium: 141 mmol/L (ref 135–145)

## 2024-03-18 LAB — HEPATIC FUNCTION PANEL
ALT: 29 U/L (ref 0–44)
AST: 36 U/L (ref 15–41)
Albumin: 3.6 g/dL (ref 3.5–5.0)
Alkaline Phosphatase: 286 U/L — ABNORMAL HIGH (ref 38–126)
Bilirubin, Direct: 0.4 mg/dL — ABNORMAL HIGH (ref 0.0–0.2)
Indirect Bilirubin: 0.4 mg/dL (ref 0.3–0.9)
Total Bilirubin: 0.7 mg/dL (ref 0.0–1.2)
Total Protein: 6.5 g/dL (ref 6.5–8.1)

## 2024-03-18 LAB — RESP PANEL BY RT-PCR (RSV, FLU A&B, COVID)  RVPGX2
Influenza A by PCR: NEGATIVE
Influenza B by PCR: NEGATIVE
Resp Syncytial Virus by PCR: NEGATIVE
SARS Coronavirus 2 by RT PCR: NEGATIVE

## 2024-03-18 LAB — ETHANOL: Alcohol, Ethyl (B): <15 mg/dL

## 2024-03-18 LAB — LIPASE, BLOOD: Lipase: 30 U/L (ref 11–51)

## 2024-03-18 MED ORDER — DULOXETINE HCL 30 MG PO CPEP
60.0000 mg | ORAL_CAPSULE | Freq: Every day | ORAL | Status: DC
  Administered 2024-03-18: 60 mg via ORAL
  Filled 2024-03-18: qty 2

## 2024-03-18 MED ORDER — LEVOTHYROXINE SODIUM 50 MCG PO TABS
25.0000 ug | ORAL_TABLET | Freq: Every day | ORAL | Status: DC
Start: 2024-03-18 — End: 2024-03-19
  Administered 2024-03-18: 25 ug via ORAL
  Filled 2024-03-18: qty 1

## 2024-03-18 MED ORDER — POTASSIUM CHLORIDE CRYS ER 20 MEQ PO TBCR
40.0000 meq | EXTENDED_RELEASE_TABLET | Freq: Once | ORAL | Status: AC
  Administered 2024-03-18: 40 meq via ORAL
  Filled 2024-03-18: qty 2

## 2024-03-18 MED ORDER — AMLODIPINE BESYLATE 5 MG PO TABS
10.0000 mg | ORAL_TABLET | Freq: Every day | ORAL | Status: DC
  Administered 2024-03-18: 10 mg via ORAL
  Filled 2024-03-18: qty 2

## 2024-03-18 MED ORDER — LORAZEPAM 2 MG/ML IJ SOLN: 2.0000 mg | Freq: Once | INTRAMUSCULAR | Status: DC

## 2024-03-18 MED ORDER — TIZANIDINE HCL 4 MG PO TABS
4.0000 mg | ORAL_TABLET | Freq: Four times a day (QID) | ORAL | Status: DC | PRN
Start: 2024-03-18 — End: 2024-03-19

## 2024-03-18 MED ORDER — METOCLOPRAMIDE HCL 5 MG/ML IJ SOLN
10.0000 mg | Freq: Once | INTRAMUSCULAR | Status: AC
  Administered 2024-03-18: 10 mg via INTRAMUSCULAR
  Filled 2024-03-18: qty 2

## 2024-03-18 MED ORDER — ZOLPIDEM TARTRATE 5 MG PO TABS
5.0000 mg | ORAL_TABLET | Freq: Every day | ORAL | Status: DC
  Administered 2024-03-18: 5 mg via ORAL
  Filled 2024-03-18: qty 1

## 2024-03-18 MED ORDER — ESTRADIOL 1 MG PO TABS
1.0000 mg | ORAL_TABLET | Freq: Every day | ORAL | Status: DC
  Filled 2024-03-18: qty 1

## 2024-03-18 MED ORDER — PANTOPRAZOLE SODIUM 40 MG PO TBEC
40.0000 mg | DELAYED_RELEASE_TABLET | Freq: Two times a day (BID) | ORAL | Status: DC
  Administered 2024-03-18: 40 mg via ORAL
  Filled 2024-03-18: qty 1

## 2024-03-18 MED ORDER — DULOXETINE HCL 30 MG PO CPEP
30.0000 mg | ORAL_CAPSULE | Freq: Every day | ORAL | Status: DC
  Administered 2024-03-18: 30 mg via ORAL
  Filled 2024-03-18: qty 1

## 2024-03-18 MED ORDER — ONDANSETRON HCL 4 MG/2ML IJ SOLN: 4.0000 mg | Freq: Once | INTRAMUSCULAR | Status: DC

## 2024-03-18 MED ORDER — ONDANSETRON 4 MG PO TBDP
4.0000 mg | ORAL_TABLET | Freq: Once | ORAL | Status: AC
  Administered 2024-03-18: 4 mg via ORAL
  Filled 2024-03-18: qty 1

## 2024-03-18 MED ORDER — METOPROLOL TARTRATE 25 MG PO TABS
25.0000 mg | ORAL_TABLET | Freq: Two times a day (BID) | ORAL | Status: DC
  Administered 2024-03-18: 25 mg via ORAL
  Filled 2024-03-18: qty 1

## 2024-03-18 MED ORDER — ACETAMINOPHEN 500 MG PO TABS
1000.0000 mg | ORAL_TABLET | Freq: Once | ORAL | Status: AC
  Administered 2024-03-18: 1000 mg via ORAL
  Filled 2024-03-18: qty 2

## 2024-03-18 MED ORDER — METHOCARBAMOL 500 MG PO TABS
1000.0000 mg | ORAL_TABLET | Freq: Once | ORAL | Status: AC
  Administered 2024-03-18: 1000 mg via ORAL
  Filled 2024-03-18: qty 2

## 2024-03-18 MED ORDER — VENLAFAXINE HCL ER 37.5 MG PO CP24
75.0000 mg | ORAL_CAPSULE | Freq: Every day | ORAL | Status: DC
  Administered 2024-03-18: 75 mg via ORAL
  Filled 2024-03-18: qty 2

## 2024-03-18 MED ORDER — LORAZEPAM 1 MG PO TABS
2.0000 mg | ORAL_TABLET | Freq: Once | ORAL | Status: AC
  Administered 2024-03-18: 2 mg via ORAL
  Filled 2024-03-18: qty 2

## 2024-03-18 MED ORDER — ALPRAZOLAM 0.5 MG PO TABS: 1.0000 mg | ORAL_TABLET | Freq: Three times a day (TID) | ORAL | Status: DC | PRN

## 2024-03-18 NOTE — ED Notes (Signed)
 Patient has been complaining of pain, wanting to leave and wanting something stronger than tylenol 

## 2024-03-18 NOTE — ED Notes (Signed)
 Pt is stating that she is sick, feeling that she is getting on everyone's nerves. Im trying to get her to relax and remain calm, pt has also been given an emesis bag.

## 2024-03-18 NOTE — ED Notes (Signed)
 Police is here to serve her IVC. Pt is being loud aggressive, and refusing to abide by the orders of her IVC.

## 2024-03-18 NOTE — ED Notes (Signed)
 Pt dressed out into BH scrubs, belongings including jeans, t shirt, and shoes placed into bag and secured into pt locker 9. Security has taken cell phone, keys, and 66 dollars cash to security office. Urine sample collected

## 2024-03-18 NOTE — ED Provider Notes (Signed)
 [name] Provider Note   CSN: [id number] Arrival date & time: 03/18/24  1101     Patient presents with: V70.1   [name] is a 63 y.o. female.   63 year old female presents for evaluation of suicidal ideations.  Was brought in by police.  Per police report patient had multiple family members who spoke with her on the phone as well as [facility] medical office and said patient told him yesterday she had a gun to her head, and had no safety on, wanted to end her life.  She also told multiple people reportedly that she had no will to live.  Patient does not endorse this to me but states she did just recently come off Percocet, states I did it alone.  States she went through withdrawals and is again having some nausea and pain.  States she has a history of cirrhosis.  She denies any homicidal ideations.  She reportedly has schizophrenia and bipolar disorder it is unknown if she is taking her medications.  She states that she has, however per police report family member states they do not think she has been taking her meds.        Prior to Admission medications   Medication Sig Start Date End Date Taking? Authorizing Provider  ALPRAZolam  (XANAX ) 1 MG tablet Take 1 mg by mouth 3 (three) times daily as needed for anxiety.   Yes [provider]  amLODipine  (NORVASC ) 10 MG tablet Take 10 mg by mouth daily. 08/23/22  Yes [provider]  diphenoxylate-atropine (LOMOTIL) 2.5-0.025 MG tablet Take 2 tablets by mouth 4 (four) times daily. Patient taking differently: Take 2 tablets by mouth 4 (four) times daily as needed. 07/25/23  Yes [provider]  DULoxetine  (CYMBALTA ) 30 MG capsule Take 30 mg by mouth daily. 01/09/21  Yes [provider]  DULoxetine  (CYMBALTA ) 60 MG capsule Take 60 mg by mouth at bedtime.   Yes [provider]  estradiol  (ESTRACE ) 1 MG tablet Take 1 mg by mouth  daily.   Yes [provider]  hydrochlorothiazide (MICROZIDE) 12.5 MG capsule Take 12.5 mg by mouth daily. 09/07/23  Yes [provider]  levothyroxine  (SYNTHROID , LEVOTHROID) 25 MCG tablet Take 25 mcg by mouth daily before breakfast. 10/30/16  Yes [provider]  metoCLOPramide  (REGLAN ) 5 MG tablet Take 5 mg by mouth 4 (four) times daily. 08/13/22  Yes [provider]  metoprolol  tartrate (LOPRESSOR ) 25 MG tablet Take 25 mg by mouth 2 (two) times daily.   Yes [provider]  oxycodone  (ROXICODONE ) 30 MG immediate release tablet Take 30 mg by mouth every 4 (four) hours as needed for pain. 02/09/21  Yes [provider]  pantoprazole  (PROTONIX ) 40 MG tablet Take 1 tablet (40 mg total) by mouth 2 (two) times daily before a meal. 02/24/24  Yes Ezzard Sonny RAMAN, PA-C  promethazine  (PHENERGAN ) 25 MG tablet Take 25 mg by mouth 4 (four) times daily as needed. 02/11/24  Yes [provider]  tiZANidine (ZANAFLEX) 4 MG tablet Take 4 mg by mouth every 6 (six) hours as needed for muscle spasms. 08/13/22  Yes [provider]  venlafaxine  XR (EFFEXOR -XR) 75 MG 24 hr capsule Take 75 mg by mouth at bedtime. 04/30/22  Yes [provider]  zolpidem (AMBIEN) 10 MG tablet Take 10 mg by mouth at bedtime.     Yes [provider]    Allergies: Codeine, Flexeril  [cyclobenzaprine ], Latex, Lyrica [  pregabalin], Nsaids, and Other    Review of Systems  Constitutional:  Negative for chills and fever.  HENT:  Negative for ear pain and sore throat.   Eyes:  Negative for pain and visual disturbance.  Respiratory:  Negative for cough and shortness of breath.   Cardiovascular:  Negative for chest pain and palpitations.  Gastrointestinal:  Positive for nausea. Negative for abdominal pain and vomiting.  Genitourinary:  Negative for dysuria and hematuria.  Musculoskeletal:  Negative for arthralgias and back pain.  Skin:  Negative for color change and  rash.  Neurological:  Negative for seizures and syncope.  Psychiatric/Behavioral:  Positive for behavioral problems and dysphoric mood. Negative for suicidal ideas. The patient is nervous/anxious.   All other systems reviewed and are negative.   Updated Vital Signs BP (!) 151/93 (BP Location: Right Arm)   Pulse (!) 103   Temp 97.8 F (36.6 C) (Oral)   Resp 16   SpO2 100%   Physical Exam Vitals and nursing note reviewed.  Constitutional:      General: She is not in acute distress.    Appearance: She is well-developed.  HENT:     Head: Normocephalic and atraumatic.  Eyes:     Conjunctiva/sclera: Conjunctivae normal.  Cardiovascular:     Rate and Rhythm: Normal rate and regular rhythm.     Heart sounds: No murmur heard. Pulmonary:     Effort: Pulmonary effort is normal. No respiratory distress.     Breath sounds: Normal breath sounds.  Abdominal:     Palpations: Abdomen is soft.     Tenderness: There is no abdominal tenderness.  Musculoskeletal:        General: No swelling.     Cervical back: Neck supple.  Skin:    General: Skin is warm and dry.     Capillary Refill: Capillary refill takes less than 2 seconds.  Neurological:     Mental Status: She is alert.  Psychiatric:        Mood and Affect: Mood normal.        Thought Content: Thought content normal.     (all labs ordered are listed, but only abnormal results are displayed) Labs Reviewed  BASIC METABOLIC PANEL WITH GFR - Abnormal; Notable for the following components:      Result Value   Potassium 2.7 (*)    Glucose, Bld 116 (*)    Creatinine, Ser 1.87 (*)    GFR, Estimated 30 (*)    All other components within normal limits  CBC WITH DIFFERENTIAL/PLATELET - Abnormal; Notable for the following components:   WBC 12.1 (*)    Monocytes Absolute 1.1 (*)    Basophils Absolute 0.2 (*)    All other components within normal limits  URINALYSIS, ROUTINE W REFLEX MICROSCOPIC - Abnormal; Notable for the following  components:   APPearance HAZY (*)    Protein, ur 100 (*)    Bacteria, UA RARE (*)    All other components within normal limits  URINE DRUG SCREEN - Abnormal; Notable for the following components:   Benzodiazepines POSITIVE (*)    All other components within normal limits  HEPATIC FUNCTION PANEL - Abnormal; Notable for the following components:   Alkaline Phosphatase 286 (*)    Bilirubin, Direct 0.4 (*)    All other components within normal limits  ETHANOL  LIPASE, BLOOD    EKG: None  Radiology: No results found.   Procedures   Medications Ordered in the ED  methocarbamol (ROBAXIN) tablet  1,000 mg (1,000 mg Oral Given 03/18/24 1210)  ondansetron  (ZOFRAN -ODT) disintegrating tablet 4 mg (4 mg Oral Given 03/18/24 1210)  potassium chloride  SA (KLOR-CON  M) CR tablet 40 mEq (40 mEq Oral Given 03/18/24 1358)  metoCLOPramide  (REGLAN ) injection 10 mg (10 mg Intramuscular Given 03/18/24 1400)    Clinical Course as of 03/18/24 1505  Wed Mar 18, 2024  1423 EKG not crossing over into MUSE, shows sinus rhythm with first-degree AV block, rate 87, LVH, there is slightly prolonged QTc, no STEMI [MK]    Clinical Course User Index [MK] Gennaro Duwaine CROME, DO                                 Medical Decision Making Social determinants of health: History of mental health disorders, noncompliance with medications  Patient here for psych eval.  She is actively suicidal per EMS report.  She has not been suicidal here but told multiple yesterday that she had a gun to her head.  She does have history of schizophrenia bipolar disorder and reportedly stopped taking her meds.  She states she recently went through a self precipitated withdrawals from Percocet as well.  Complaining of some abdominal pain and nausea that she states is chronic from her cirrhosis.  On lab workup she was found have low potassium but is otherwise medically cleared.  TTS consult and psychiatry consult placed and pending  recommendations at this time.  Do think patient might benefit from inpatient psychiatric stay.  Problems Addressed: History of schizophrenia: chronic illness or injury with exacerbation, progression, or side effects of treatment Hypokalemia: acute illness or injury Suicidal ideations: undiagnosed new problem with uncertain prognosis  Amount and/or Complexity of Data Reviewed External Data Reviewed: notes.    Details: Prior ED records reviewed and patient with history of upper GI bleed in April Labs: ordered. Decision-making details documented in ED Course.    Details: Ordered and reviewed by me and patient has hypokalemia but labs otherwise fairly unremarkable ECG/medicine tests: ordered and independent interpretation performed. Decision-making details documented in ED Course.    Details: Ordered interpreted by me in the absence of cardiology and shows slightly prolonged QTc, no STEMI or acute change when compared to prior  Risk OTC drugs. Prescription drug management. Decision regarding hospitalization. Diagnosis or treatment significantly limited by social determinants of health.     Final diagnoses:  Suicidal ideations  History of schizophrenia  Hypokalemia    ED Discharge Orders     None          Gennaro Duwaine CROME, DO 03/18/24 1505

## 2024-03-18 NOTE — Progress Notes (Signed)
 Pt has been accepted to [facility] on 03/18/2024 . Bed assignment: [employer]   Pt meets inpatient criteria per Efrain Patient, NP   Attending Physician will be Dr. Narvis Kleine   Report can be called to: - [phone]  Pt can arrive after 8AM pending negative Covid   Care Team Notified: Anette Arlean Sabins, RN

## 2024-03-18 NOTE — ED Notes (Signed)
 [name] from [facility] stated that inpatient treatment is recommeded for this pt, When asked for clarification if pt was going to go to [facility] at this time, it was stated no

## 2024-03-18 NOTE — ED Notes (Signed)
 Pt endorses recent marijuana use

## 2024-03-18 NOTE — ED Notes (Signed)
 Pt continuing to display outburst after being served IVC papers and not being granted multiple medications including narcotic medications. Pt requiring multiple team members to redirect and deescalate emotional behavior. Multiple team members continue in attempt to educate process and policy of an IVC.

## 2024-03-18 NOTE — ED Notes (Signed)
 Pt Had TTS appointment

## 2024-03-18 NOTE — ED Notes (Signed)
 Pt has been accepted by [facility] in [place], [place]. We need to fax the results of the COVID swab before transportation notified. Fax number is [phone]. Report can be called to [phone] when ready. Accepting physician is Dr. Dala. Facility address is [address], [place], [place] [address].

## 2024-03-18 NOTE — ED Triage Notes (Signed)
 Pt arrived via [facility], accompanied by [employer], with pt from home that stated to a family member yesterday, per [employer], I have a gun to my head and the safety is off. Pt also had an appt at [facility] but missed it which prompted [facility] to reach out and pt stated to an employee at [facility] that I have a gun to my head with the safety off and I want to pull the trigger. Pt has a Hx of schizophrenia per [employer]. Pt also has a Hx of cirrhosis and colon cancer and has, Per [employer], stopped taking her pain medication cold [place] and has been experiencing nausea and diarrhea. Also per [employer], they did not look to see if pt had any firearms in the house.
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
## 2024-03-18 NOTE — ED Notes (Signed)
 Patient had a meltdown, wanting help, feeling overwhelmed after [employer] told her that a judge ordered her to be here and it just made her feel that she was going to be sent off, crying because Im leaving. She's just a little emotional

## 2024-03-18 NOTE — ED Notes (Addendum)
 Per [employer] at bedside pt informed sister, Apolinar Frost, that she had a plan to shoot herself  Contact: [phone]

## 2024-03-18 NOTE — ED Notes (Signed)
 Pt allowed to call daughter to give her an update on the plan of care.

## 2024-03-18 NOTE — Progress Notes (Signed)
 Inpatient Psychiatric Referral  Patient was recommended inpatient per Kyra Weber, NP . There are no available beds at [facility], per [facility]. Patient was referred to the following out of network facilities:  [facility] Provider Address Phone Fax  [facility]  [address] [place] [address] [phone]  [facility]  [address] [place] [address] [phone]  [facility]  [address] [place], [place] [address] [phone]  [facility]  [address] [place], [place] [address] [phone]  [facility]  [address] [place] [address] [phone]  [facility]  [address] [place] [address] [phone]  [facility]  [address] [place] [address] [phone]  [facility]  [address] [place] [address] [phone]  [facility] EFAX  [address] [place], [place] [phone]  [facility]  [address] [place] [address] [phone]  [facility]  [address] [place] [address] [phone]    Situation ongoing, CSW to continue following and update chart as more information becomes available.   Harrie Sofia MSW, LCSWA 03/18/2024  9:37PM

## 2024-03-19 DIAGNOSIS — K219 Gastro-esophageal reflux disease without esophagitis: Secondary | ICD-10-CM | POA: Diagnosis not present

## 2024-03-19 DIAGNOSIS — F13939 Sedative, hypnotic or anxiolytic use, unspecified with withdrawal, unspecified: Secondary | ICD-10-CM | POA: Diagnosis not present

## 2024-03-19 DIAGNOSIS — Z1152 Encounter for screening for COVID-19: Secondary | ICD-10-CM | POA: Diagnosis not present

## 2024-03-19 DIAGNOSIS — E876 Hypokalemia: Secondary | ICD-10-CM | POA: Diagnosis not present

## 2024-03-19 DIAGNOSIS — I959 Hypotension, unspecified: Secondary | ICD-10-CM | POA: Diagnosis not present

## 2024-03-19 DIAGNOSIS — R4182 Altered mental status, unspecified: Secondary | ICD-10-CM | POA: Diagnosis not present

## 2024-03-19 DIAGNOSIS — N179 Acute kidney failure, unspecified: Secondary | ICD-10-CM | POA: Diagnosis not present

## 2024-03-19 DIAGNOSIS — I10 Essential (primary) hypertension: Secondary | ICD-10-CM | POA: Diagnosis not present

## 2024-03-19 DIAGNOSIS — R000 Tachycardia, unspecified: Secondary | ICD-10-CM | POA: Diagnosis not present

## 2024-03-19 DIAGNOSIS — R45851 Suicidal ideations: Secondary | ICD-10-CM | POA: Diagnosis not present

## 2024-03-19 DIAGNOSIS — K7460 Unspecified cirrhosis of liver: Secondary | ICD-10-CM | POA: Diagnosis not present

## 2024-03-19 DIAGNOSIS — K7682 Hepatic encephalopathy: Secondary | ICD-10-CM | POA: Diagnosis not present

## 2024-03-19 DIAGNOSIS — F250 Schizoaffective disorder, bipolar type: Secondary | ICD-10-CM | POA: Diagnosis not present

## 2024-03-19 DIAGNOSIS — E039 Hypothyroidism, unspecified: Secondary | ICD-10-CM | POA: Diagnosis not present

## 2024-03-19 NOTE — ED Provider Notes (Signed)
 Patient accepted to [facility], Dr. Narvis Kleine. Law Enforcement here for transport.    Roselyn Carlin NOVAK, MD 03/19/24 [phone]

## 2024-03-24 NOTE — Addendum Note (Signed)
 Addended by: WELLINGTON MILLING on: 03/24/2024 04:19 PM   Modules accepted: Orders

## 2024-04-20 ENCOUNTER — Ambulatory Visit (INDEPENDENT_AMBULATORY_CARE_PROVIDER_SITE_OTHER): Payer: Self-pay

## 2024-04-20 VITALS — BP 126/75 | HR 76 | Ht 66.0 in | Wt 213.0 lb

## 2024-04-20 DIAGNOSIS — R1011 Right upper quadrant pain: Secondary | ICD-10-CM | POA: Diagnosis not present

## 2024-04-20 DIAGNOSIS — Z860100 Personal history of colon polyps, unspecified: Secondary | ICD-10-CM | POA: Diagnosis not present

## 2024-04-20 DIAGNOSIS — N951 Menopausal and female climacteric states: Secondary | ICD-10-CM | POA: Diagnosis not present

## 2024-04-20 DIAGNOSIS — F418 Other specified anxiety disorders: Secondary | ICD-10-CM | POA: Diagnosis not present

## 2024-04-20 DIAGNOSIS — F5101 Primary insomnia: Secondary | ICD-10-CM

## 2024-04-20 DIAGNOSIS — I10 Essential (primary) hypertension: Secondary | ICD-10-CM

## 2024-04-20 DIAGNOSIS — R188 Other ascites: Secondary | ICD-10-CM | POA: Diagnosis not present

## 2024-04-20 DIAGNOSIS — K7460 Unspecified cirrhosis of liver: Secondary | ICD-10-CM

## 2024-04-20 DIAGNOSIS — K625 Hemorrhage of anus and rectum: Secondary | ICD-10-CM | POA: Diagnosis not present

## 2024-04-20 DIAGNOSIS — K7290 Hepatic failure, unspecified without coma: Secondary | ICD-10-CM | POA: Diagnosis not present

## 2024-04-20 DIAGNOSIS — R7989 Other specified abnormal findings of blood chemistry: Secondary | ICD-10-CM | POA: Diagnosis not present

## 2024-04-20 DIAGNOSIS — K219 Gastro-esophageal reflux disease without esophagitis: Secondary | ICD-10-CM | POA: Diagnosis not present

## 2024-04-20 DIAGNOSIS — G894 Chronic pain syndrome: Secondary | ICD-10-CM | POA: Diagnosis not present

## 2024-04-20 DIAGNOSIS — E039 Hypothyroidism, unspecified: Secondary | ICD-10-CM

## 2024-04-20 DIAGNOSIS — K920 Hematemesis: Secondary | ICD-10-CM | POA: Diagnosis not present

## 2024-04-20 MED ORDER — DULOXETINE HCL 60 MG PO CPEP: 60.0000 mg | ORAL_CAPSULE | Freq: Every day | ORAL | 3 refills | Status: DC

## 2024-04-20 MED ORDER — ZOLPIDEM TARTRATE 5 MG PO TABS: 5.0000 mg | ORAL_TABLET | Freq: Every day | ORAL | 0 refills | Status: DC

## 2024-04-20 MED ORDER — VENLAFAXINE HCL ER 75 MG PO CP24: 75.0000 mg | ORAL_CAPSULE | Freq: Every day | ORAL | 3 refills | Status: DC

## 2024-04-20 MED ORDER — AMLODIPINE BESYLATE 10 MG PO TABS: 10.0000 mg | ORAL_TABLET | Freq: Every day | ORAL | 3 refills | Status: DC

## 2024-04-20 MED ORDER — DULOXETINE HCL 30 MG PO CPEP: 30.0000 mg | ORAL_CAPSULE | Freq: Every day | ORAL | 3 refills | Status: DC

## 2024-04-20 MED ORDER — HYDROCHLOROTHIAZIDE 12.5 MG PO CAPS: 12.5000 mg | ORAL_CAPSULE | Freq: Every day | ORAL | 3 refills | Status: DC

## 2024-04-20 MED ORDER — ESTRADIOL 0.05 MG/24HR TD PTWK: 0.0500 mg | MEDICATED_PATCH | TRANSDERMAL | 3 refills | Status: DC

## 2024-04-20 MED ORDER — LEVOTHYROXINE SODIUM 25 MCG PO TABS: 25.0000 ug | ORAL_TABLET | Freq: Every day | ORAL | 3 refills | Status: DC

## 2024-04-20 MED ORDER — METOPROLOL TARTRATE 25 MG PO TABS: 25.0000 mg | ORAL_TABLET | Freq: Two times a day (BID) | ORAL | 3 refills | Status: DC

## 2024-04-20 NOTE — Progress Notes (Unsigned)
 New Patient Office Visit  Subjective    Patient ID: [name], female    DOB: 1961-03-03  Age: 63 y.o. MRN: [id number]  CC:  Chief Complaint  Patient presents with   Establish Care    Pt here to Establish Care, Pt states she is having troubles with her kidneys, and having pains, she is seeing things that's not there    HPI [name] presents to establish care Discussed the use of AI scribe software for clinical note transcription with the patient, who gave verbal consent to proceed.  History of Present Illness    [name] is a 63 year old female with hypertension and liver cirrhosis who presents for establishment of care.  Hepatic cirrhosis and hepatic encephalopathy - Diagnosed with advanced liver cirrhosis, described as 'worst stage'. - Episodes of hepatic encephalopathy resulting in hospitalization and disorientation, including unawareness of surroundings and inability to form complete sentences. - Received Ativan  during hospitalization, which caused significant hypotension. - Hospitalized for four days, received intravenous potassium and other supplements for low levels. - Underwent paracentesis for ascites; fluid reaccumulates quickly. - Current symptoms include significant fatigue, nausea, pain under the liver, and changes in bowel habits with loose, light-colored stools. - Currently taking lactulose for hepatic encephalopathy. - Family history of liver disease; genetic testing indicates hereditary component.  Hypertension - History of hypertension. - Experienced significant hypotension following Ativan  administration during hospitalization.  Musculoskeletal pain and surgical history - History of knee and back surgeries, including two prior back surgeries. - Ongoing back pain, managed with methocarbamol at night. - Advised that further back surgery may be needed, but feels unable to undergo surgery due to current health status.  Neuropsychiatric  symptoms - History of bipolar disorder and depression. - Recent onset of insomnia, sleeping only two to three hours per night; previously slept 11-12 hours nightly. - Experiences tinnitus and racing thoughts. - Recent episode of suicidal ideation during hepatic encephalopathy, with poor recall of the event. - Attributes some symptoms to stress following hepatic encephalopathy episode. - Currently taking Cymbalta  (60 mg and 30 mg) and Ambien.  Medication withdrawal - Recently discontinued oxycodone , managing withdrawal symptoms at home with support from daughter and dog.  Gastrointestinal symptoms - Nausea and changes in bowel habits, including loose and light-colored stools. - Recently switched from Reglan  to Protonix  for stomach issues. - Currently taking Protonix  twice daily. - Reports being out of most medications due to financial constraints and is on disability.      Outpatient Encounter Medications as of 04/20/2024  Medication Sig   ALPRAZolam  (XANAX ) 1 MG tablet Take 1 mg by mouth 3 (three) times daily as needed for anxiety.   diphenoxylate-atropine (LOMOTIL) 2.5-0.025 MG tablet Take 2 tablets by mouth 4 (four) times daily. (Patient taking differently: Take 2 tablets by mouth 4 (four) times daily as needed.)   estradiol  (CLIMARA  - DOSED IN MG/24 HR) 0.05 mg/24hr patch Place 1 patch (0.05 mg total) onto the skin once a week.   lactulose (CHRONULAC) 10 GM/15ML solution Take 20 g by mouth 2 (two) times daily.   methocarbamol (ROBAXIN) 500 MG tablet Take 500 mg by mouth 3 (three) times daily.   metoCLOPramide  (REGLAN ) 5 MG tablet Take 5 mg by mouth 4 (four) times daily.   pantoprazole  (PROTONIX ) 40 MG tablet Take 1 tablet (40 mg total) by mouth 2 (two) times daily before a meal.   promethazine  (PHENERGAN ) 25 MG tablet Take 25 mg by mouth 4 (four)  times daily as needed.   [DISCONTINUED] amLODipine  (NORVASC ) 10 MG tablet Take 10 mg by mouth daily.   [DISCONTINUED] DULoxetine  (CYMBALTA )  30 MG capsule Take 30 mg by mouth daily.   [DISCONTINUED] DULoxetine  (CYMBALTA ) 60 MG capsule Take 60 mg by mouth at bedtime.   [DISCONTINUED] estradiol  (ESTRACE ) 1 MG tablet Take 1 mg by mouth daily.   [DISCONTINUED] hydrochlorothiazide (MICROZIDE) 12.5 MG capsule Take 12.5 mg by mouth daily.   [DISCONTINUED] levothyroxine  (SYNTHROID , LEVOTHROID) 25 MCG tablet Take 25 mcg by mouth daily before breakfast.   [DISCONTINUED] metoprolol  tartrate (LOPRESSOR ) 25 MG tablet Take 25 mg by mouth 2 (two) times daily.   [DISCONTINUED] venlafaxine  XR (EFFEXOR -XR) 75 MG 24 hr capsule Take 75 mg by mouth at bedtime.   [DISCONTINUED] zolpidem (AMBIEN) 10 MG tablet Take 10 mg by mouth at bedtime.     amLODipine  (NORVASC ) 10 MG tablet Take 1 tablet (10 mg total) by mouth daily.   DULoxetine  (CYMBALTA ) 30 MG capsule Take 1 capsule (30 mg total) by mouth daily.   DULoxetine  (CYMBALTA ) 60 MG capsule Take 1 capsule (60 mg total) by mouth daily.   hydrochlorothiazide (MICROZIDE) 12.5 MG capsule Take 1 capsule (12.5 mg total) by mouth daily.   levothyroxine  (SYNTHROID ) 25 MCG tablet Take 1 tablet (25 mcg total) by mouth daily before breakfast.   metoprolol  tartrate (LOPRESSOR ) 25 MG tablet Take 1 tablet (25 mg total) by mouth 2 (two) times daily.   venlafaxine  XR (EFFEXOR -XR) 75 MG 24 hr capsule Take 1 capsule (75 mg total) by mouth daily with breakfast.   zolpidem (AMBIEN) 5 MG tablet Take 1 tablet (5 mg total) by mouth at bedtime.   [DISCONTINUED] oxycodone  (ROXICODONE ) 30 MG immediate release tablet Take 30 mg by mouth every 4 (four) hours as needed for pain. (Patient not taking: Reported on 04/20/2024)   [DISCONTINUED] tiZANidine (ZANAFLEX) 4 MG tablet Take 4 mg by mouth every 6 (six) hours as needed for muscle spasms. (Patient not taking: Reported on 04/20/2024)   No facility-administered encounter medications on file as of 04/20/2024.    Past Medical History:  Diagnosis Date   Anemia    Anxiety    Asthma     Cirrhosis (HCC)    Cirrhosis (HCC)    COPD (chronic obstructive pulmonary disease) (HCC)    Depression    Disc degeneration, lumbar    GERD (gastroesophageal reflux disease)    Hypertension    Hypothyroidism    Leukocytosis    Lumbar disc disease    Portal hypertension (HCC)    Shortness of breath    Thrombocytosis     Past Surgical History:  Procedure Laterality Date   ABDOMINAL HYSTERECTOMY     APPENDECTOMY     BACK SURGERY     BIOPSY  03/25/2014   Procedure: GASTRIC BIOPSIES;  Surgeon: Lamar CHRISTELLA Hollingshead, MD;  Location: [facility];  Service: Endoscopy;;   COLONOSCOPY  02/21/2007   MFM:Jwjo papilla and internal hemorrhoids, diminutive rectal polyp/Left-sided diverticula (sigmoid) pedunculated polyps mid descending   COLONOSCOPY  06/04/2006   Dr. Hollingshead: segmental biopsy negative for microscopic colitis. Three polyps, one tubular adenoma. Surveillance due 2013.    COLONOSCOPY N/A 03/04/2024   Procedure: COLONOSCOPY;  Surgeon: Hollingshead Lamar CHRISTELLA, MD;  Location: [facility];  Service: Endoscopy;  Laterality: N/A;  10:00 am, asa 3   COLONOSCOPY WITH PROPOFOL  N/A 03/25/2014   RMR: Rectal and colonic polyps removed as described above.  Colonic diverticulosis. CT abnormality likely artifactual in orgin  COLONOSCOPY WITH PROPOFOL  N/A 07/04/2022   Procedure: COLONOSCOPY WITH PROPOFOL ;  Surgeon: Shaaron Lamar HERO, MD;  Location: [facility];  Service: Endoscopy;  Laterality: N/A;  10:15 am   ESOPHAGEAL DILATION N/A 03/04/2024   Procedure: DILATION, ESOPHAGUS;  Surgeon: Shaaron Lamar HERO, MD;  Location: [facility];  Service: Endoscopy;  Laterality: N/A;   ESOPHAGOGASTRODUODENOSCOPY  08/08/2005   WLM:Wnmfjo esophagogastroduodenoscopy.   ESOPHAGOGASTRODUODENOSCOPY  06/04/2009   Dr. Shaaron: normal esophagus, small hiatal hernia, duodenal diverticulum, negative H.pylori and celiac   ESOPHAGOGASTRODUODENOSCOPY N/A 03/04/2024   Procedure: EGD (ESOPHAGOGASTRODUODENOSCOPY);  Surgeon: Shaaron Lamar HERO, MD;   Location: [facility];  Service: Endoscopy;  Laterality: N/A;   ESOPHAGOGASTRODUODENOSCOPY (EGD) WITH PROPOFOL  N/A 03/25/2014   RMR: Probable occult cervical esophageal web- status post dilation as described above. Abnormal gastic mucosa of uncertain significance-status post gasrtric biopsy   ESOPHAGOGASTRODUODENOSCOPY (EGD) WITH PROPOFOL  N/A 07/04/2022   Procedure: ESOPHAGOGASTRODUODENOSCOPY (EGD) WITH PROPOFOL ;  Surgeon: Shaaron Lamar HERO, MD;  Location: [facility];  Service: Endoscopy;  Laterality: N/A;   FLEXIBLE SIGMOIDOSCOPY N/A 09/05/2022   Procedure: FLEXIBLE SIGMOIDOSCOPY;  Surgeon: Shaaron Lamar HERO, MD;  Location: [facility];  Service: Endoscopy;  Laterality: N/A;   LIPOMA EXCISION N/A 11/23/2022   Procedure: Removal of lipoma, L5 paraspinous soft tissue;  Surgeon: Gillie Duncans, MD;  Location: [facility];  Service: Neurosurgery;  Laterality: N/A;  RM 21 3C   MALONEY DILATION N/A 03/25/2014   Procedure: MALONEY ESOPHAGEAL DILATION #54 Fr;  Surgeon: Lamar HERO Shaaron, MD;  Location: [facility];  Service: Endoscopy;  Laterality: N/A;   POLYPECTOMY  03/25/2014   Procedure: SIGMOID AND RECTAL POLYPECTOMY;  Surgeon: Lamar HERO Shaaron, MD;  Location: [facility];  Service: Endoscopy;;   right knee surgery     rt breast biopsy     TOTAL ABDOMINAL HYSTERECTOMY W/ BILATERAL SALPINGOOPHORECTOMY     age 73    Family History  Adopted: Yes  Problem Relation Age of Onset   Pancreatic cancer Mother    Leukemia Sister        1/2 sister   Hepatitis C Brother    Liver cancer Brother        suicide   Colon cancer Brother        58   Cirrhosis Brother        etoh use   Pancreatic cancer Paternal Grandmother    Kidney cancer Paternal Grandfather    Pancreatic cancer Maternal Aunt    Kidney cancer Maternal Uncle    Pancreatic cancer Maternal Uncle    Colon cancer Paternal Aunt    Breast cancer Neg Hx     Social History   Socioeconomic History   Marital status: Single    Spouse name: Not on file    Number of children: Not on file   Years of education: Not on file   Highest education level: Not on file  Occupational History   Not on file  Tobacco Use   Smoking status: Every Day    Current packs/day: 0.50    Average packs/day: 0.5 packs/day for 30.0 years (15.0 ttl pk-yrs)    Types: Cigarettes   Smokeless tobacco: Never  Vaping Use   Vaping status: Never Used  Substance and Sexual Activity   Alcohol use: No    Comment: see HPI. Patient denies alcohol use at present/past.   Drug use: No   Sexual activity: Not Currently  Other Topics Concern   Not on file  Social History Narrative  Not on file   Social Drivers of Health   Financial Resource Strain: Not on file  Food Insecurity: Not on file  Transportation Needs: Not on file  Physical Activity: Not on file  Stress: Not on file  Social Connections: Not on file  Intimate Partner Violence: Not on file    ROS      Objective    BP 126/75   Pulse 76   Ht 5' 6 (1.676 m)   Wt 213 lb (96.6 kg)   SpO2 97%   BMI 34.38 kg/m   Physical Exam Vitals and nursing note reviewed.  Constitutional:      Appearance: Normal appearance. She is obese.  HENT:     Head: Normocephalic.     Right Ear: Tympanic membrane, ear canal and external ear normal.     Left Ear: Tympanic membrane, ear canal and external ear normal.     Nose: Nose normal.     Mouth/Throat:     Mouth: Mucous membranes are moist.     Pharynx: Oropharynx is clear.  Eyes:     Extraocular Movements: Extraocular movements intact.     Conjunctiva/sclera: Conjunctivae normal.     Pupils: Pupils are equal, round, and reactive to light.  Cardiovascular:     Rate and Rhythm: Normal rate and regular rhythm.  Pulmonary:     Effort: Pulmonary effort is normal.     Breath sounds: Normal breath sounds.  Abdominal:     General: Bowel sounds are normal.     Palpations: Abdomen is soft.  Musculoskeletal:        General: Normal range of motion.     Cervical back:  Normal range of motion and neck supple.  Skin:    General: Skin is warm and dry.  Neurological:     Mental Status: She is alert and oriented to person, place, and time.  Psychiatric:        Mood and Affect: Mood normal.        Thought Content: Thought content normal.         Assessment & Plan:   Problem List Items Addressed This Visit       Cardiovascular and Mediastinum   Primary hypertension   Blood pressure well-controlled at 126/75 mmHg on current regimen. - Continue current antihypertensive regimen.      Relevant Medications   amLODipine  (NORVASC ) 10 MG tablet   hydrochlorothiazide (MICROZIDE) 12.5 MG capsule   metoprolol  tartrate (LOPRESSOR ) 25 MG tablet     Digestive   Hepatic cirrhosis (HCC)    Liver cirrhosis with hepatic encephalopathy and ascites Severe liver cirrhosis with hepatic encephalopathy and ascites. Recent hospitalization and concerns about potential liver cancer metastasis. Genetic predisposition noted. Reports exhaustion, loose stools, and abdominal pain. - Continue lactulose for hepatic encephalopathy. - Follow-up with GI as planned        Endocrine   Hypothyroidism - Primary   Stable with levothyroxine  25 mcg.  Will recheck labs at future visit      Relevant Medications   levothyroxine  (SYNTHROID ) 25 MCG tablet   metoprolol  tartrate (LOPRESSOR ) 25 MG tablet     Other   Chronic pain   Chronic pain managed with methocarbamol. Discontinued oxycodone  due to adverse effects. - Continue methocarbamol for back pain management.      Relevant Medications   methocarbamol (ROBAXIN) 500 MG tablet   DULoxetine  (CYMBALTA ) 30 MG capsule   DULoxetine  (CYMBALTA ) 60 MG capsule   venlafaxine  XR (EFFEXOR -XR) 75 MG 24 hr  capsule   Menopausal state   Post-hysterectomy menopausal state. Currently on estradiol , considering switch to patch to reduce liver burden. - Changed estradiol  to patch to reduce liver burden.      Relevant Medications   estradiol   (CLIMARA  - DOSED IN MG/24 HR) 0.05 mg/24hr patch   Primary insomnia   Chronic insomnia possibly exacerbated by stress and liver condition. Currently taking Ambien. - Continue Ambien for insomnia management.      Relevant Medications   zolpidem (AMBIEN) 5 MG tablet   Depression with anxiety   Bipolar disorder and depression with recent symptom exacerbation. Currently on Cymbalta  and venlafaxine . - Continue Cymbalta  and venlafaxine  for mood stabilization.      Relevant Medications   DULoxetine  (CYMBALTA ) 30 MG capsule   DULoxetine  (CYMBALTA ) 60 MG capsule   venlafaxine  XR (EFFEXOR -XR) 75 MG 24 hr capsule       No follow-ups on file.   Leita Longs, FNP

## 2024-04-21 DIAGNOSIS — N951 Menopausal and female climacteric states: Secondary | ICD-10-CM | POA: Insufficient documentation

## 2024-04-21 DIAGNOSIS — I10 Essential (primary) hypertension: Secondary | ICD-10-CM | POA: Insufficient documentation

## 2024-04-21 DIAGNOSIS — F418 Other specified anxiety disorders: Secondary | ICD-10-CM | POA: Insufficient documentation

## 2024-04-21 DIAGNOSIS — F5101 Primary insomnia: Secondary | ICD-10-CM | POA: Insufficient documentation

## 2024-04-21 NOTE — Assessment & Plan Note (Signed)
 Blood pressure well-controlled at 126/75 mmHg on current regimen. - Continue current antihypertensive regimen.

## 2024-04-21 NOTE — Assessment & Plan Note (Signed)
 Chronic insomnia possibly exacerbated by stress and liver condition. Currently taking Ambien. - Continue Ambien for insomnia management.

## 2024-04-21 NOTE — Assessment & Plan Note (Signed)
 Liver cirrhosis with hepatic encephalopathy and ascites Severe liver cirrhosis with hepatic encephalopathy and ascites. Recent hospitalization and concerns about potential liver cancer metastasis. Genetic predisposition noted. Reports exhaustion, loose stools, and abdominal pain. - Continue lactulose for hepatic encephalopathy. - Follow-up with GI as planned

## 2024-04-21 NOTE — Assessment & Plan Note (Signed)
 Post-hysterectomy menopausal state. Currently on estradiol , considering switch to patch to reduce liver burden. - Changed estradiol  to patch to reduce liver burden.

## 2024-04-21 NOTE — Assessment & Plan Note (Signed)
 Stable with levothyroxine  25 mcg.  Will recheck labs at future visit

## 2024-04-21 NOTE — Assessment & Plan Note (Signed)
 Chronic pain managed with methocarbamol. Discontinued oxycodone  due to adverse effects. - Continue methocarbamol for back pain management.

## 2024-04-21 NOTE — Assessment & Plan Note (Signed)
 Bipolar disorder and depression with recent symptom exacerbation. Currently on Cymbalta  and venlafaxine . - Continue Cymbalta  and venlafaxine  for mood stabilization.

## 2024-04-22 LAB — CBC WITH DIFFERENTIAL/PLATELET
Absolute Lymphocytes: 2821 {cells}/uL (ref 850–3900)
Absolute Monocytes: 713 {cells}/uL (ref 200–950)
Basophils Absolute: 107 {cells}/uL (ref 0–200)
Basophils Relative: 1.3 %
Eosinophils Absolute: 312 {cells}/uL (ref 15–500)
Eosinophils Relative: 3.8 %
HCT: 36.4 % (ref 35.0–45.0)
Hemoglobin: 11.8 g/dL (ref 11.7–15.5)
MCH: 28.9 pg (ref 27.0–33.0)
MCHC: 32.4 g/dL (ref 32.0–36.0)
MCV: 89.2 fL (ref 80.0–100.0)
MPV: 11.7 fL (ref 7.5–12.5)
Monocytes Relative: 8.7 %
Neutro Abs: 4248 {cells}/uL (ref 1500–7800)
Neutrophils Relative %: 51.8 %
Platelets: 273 Thousand/uL (ref 140–400)
RBC: 4.08 Million/uL (ref 3.80–5.10)
RDW: 14.2 % (ref 11.0–15.0)
Total Lymphocyte: 34.4 %
WBC: 8.2 Thousand/uL (ref 3.8–10.8)

## 2024-04-22 LAB — COMPREHENSIVE METABOLIC PANEL WITH GFR
AG Ratio: 1.4 (calc) (ref 1.0–2.5)
ALT: 18 U/L (ref 6–29)
AST: 29 U/L (ref 10–35)
Albumin: 3.6 g/dL (ref 3.6–5.1)
Alkaline phosphatase (APISO): 216 U/L — ABNORMAL HIGH (ref 37–153)
BUN/Creatinine Ratio: 11 (calc) (ref 6–22)
BUN: 14 mg/dL (ref 7–25)
CO2: 29 mmol/L (ref 20–32)
Calcium: 9.4 mg/dL (ref 8.6–10.4)
Chloride: 105 mmol/L (ref 98–110)
Creat: 1.32 mg/dL — ABNORMAL HIGH (ref 0.50–1.05)
Globulin: 2.6 g/dL (ref 1.9–3.7)
Glucose, Bld: 64 mg/dL — ABNORMAL LOW (ref 65–99)
Potassium: 3.6 mmol/L (ref 3.5–5.3)
Sodium: 139 mmol/L (ref 135–146)
Total Bilirubin: 0.5 mg/dL (ref 0.2–1.2)
Total Protein: 6.2 g/dL (ref 6.1–8.1)
eGFR: 46 mL/min/1.73m2 — ABNORMAL LOW (ref >=60)

## 2024-04-22 LAB — AFP TUMOR MARKER: AFP-Tumor Marker: 3.8 ng/mL

## 2024-04-22 LAB — PROTIME-INR
INR: 1.1
Prothrombin Time: 11.2 s (ref 9.0–11.5)

## 2024-04-22 LAB — B12 AND FOLATE PANEL
Folate: 2.4 ng/mL — ABNORMAL LOW
Vitamin B-12: 541 pg/mL (ref 200–1100)

## 2024-04-22 LAB — IRON,TIBC AND FERRITIN PANEL
%SAT: 22 % (ref 16–45)
Ferritin: 46 ng/mL (ref 16–288)
Iron: 59 ug/dL (ref 45–288)
TIBC: 269 ug/dL (ref 250–450)

## 2024-05-04 ENCOUNTER — Ambulatory Visit (HOSPITAL_COMMUNITY)
Admission: RE | Admit: 2024-05-04 | Discharge: 2024-05-04 | Disposition: A | Source: Ambulatory Visit | Attending: Gastroenterology | Admitting: Gastroenterology

## 2024-05-04 DIAGNOSIS — K920 Hematemesis: Secondary | ICD-10-CM | POA: Diagnosis not present

## 2024-05-04 DIAGNOSIS — K219 Gastro-esophageal reflux disease without esophagitis: Secondary | ICD-10-CM | POA: Insufficient documentation

## 2024-05-04 DIAGNOSIS — K7290 Hepatic failure, unspecified without coma: Secondary | ICD-10-CM | POA: Insufficient documentation

## 2024-05-04 DIAGNOSIS — K7460 Unspecified cirrhosis of liver: Secondary | ICD-10-CM | POA: Insufficient documentation

## 2024-05-04 DIAGNOSIS — Z860101 Personal history of adenomatous and serrated colon polyps: Secondary | ICD-10-CM | POA: Diagnosis not present

## 2024-05-04 DIAGNOSIS — Z860100 Personal history of colon polyps, unspecified: Secondary | ICD-10-CM | POA: Diagnosis not present

## 2024-05-04 DIAGNOSIS — R1319 Other dysphagia: Secondary | ICD-10-CM | POA: Diagnosis not present

## 2024-05-04 DIAGNOSIS — R1011 Right upper quadrant pain: Secondary | ICD-10-CM | POA: Insufficient documentation

## 2024-05-04 DIAGNOSIS — Z800 Family history of malignant neoplasm of digestive organs: Secondary | ICD-10-CM | POA: Diagnosis not present

## 2024-05-04 DIAGNOSIS — K625 Hemorrhage of anus and rectum: Secondary | ICD-10-CM | POA: Insufficient documentation

## 2024-05-04 DIAGNOSIS — K7689 Other specified diseases of liver: Secondary | ICD-10-CM | POA: Diagnosis not present

## 2024-05-08 ENCOUNTER — Ambulatory Visit: Payer: Self-pay | Admitting: Gastroenterology

## 2024-05-08 DIAGNOSIS — E538 Deficiency of other specified B group vitamins: Secondary | ICD-10-CM

## 2024-05-08 DIAGNOSIS — K7460 Unspecified cirrhosis of liver: Secondary | ICD-10-CM

## 2024-05-25 ENCOUNTER — Ambulatory Visit: Payer: Self-pay

## 2024-05-25 NOTE — Telephone Encounter (Signed)
 Video visit with Me[facility] 1040   FYI Only or Action Required?: FYI only for provider: appointment scheduled on Friday.  Patient was last seen in primary care on 04/20/2024 by Bevely Doffing, FNP.  Called Nurse Triage reporting Medication Refill (Out of xanax  for 1 month, wants a non narcotic for her anxiety and racing thoughts).  Symptoms began 11 days ago.  Interventions attempted: Nothing.  Symptoms are: gradually worsening.  Triage Disposition: See PCP When Office is Open (Within 3 Days)  Patient/caregiver understands and will follow disposition?:   Copied from CRM #[id number]. Topic: Clinical - Red Word Triage >> May 25, 2024  2:30 PM Tiffini S wrote: [facility] that prompted transfer to Nurse Triage: patient father passed yesterday morning/ was living together- mind in moving with rapid thoughts- haven't slept in 11 day and 12 nights Ran out of ALPRAZolam  (XANAX ) 1 MG tablet- need help with nerves Reason for Disposition  [1] Significant weight loss (more than 10 pounds [4.5 kg]; 5% or more) AND [2] not dieting  Answer Assessment - Initial Assessment Questions Contacted [facility], spoke with Brittany, she was able to schedule the pt for a virtual appt this Friday 1040   1. CONCERN: Did anything happen that prompted you to call today?     Her father died yesterday and the pt reports she hasn't slept in 11 days and 11 nights, reports anxiety, racing thoughts  2. ANXIETY SYMPTOMS: Can you describe how you (your loved one; patient) have been feeling? (e.g., tense, restless, panicky, anxious, keyed up, overwhelmed, sense of impending doom).      Restless, insomnia  3. ONSET: How long have you been feeling this way? (e.g., hours, days, weeks)     11 days  4. SEVERITY: How would you rate the level of anxiety? (e.g., 0 - 10; or mild, moderate, severe).     Severe, impacting her sleep, getting worse  6. HISTORY: Have you felt this way before? Have you ever been diagnosed with an  anxiety problem in the past? (e.g., generalized anxiety disorder, panic attacks, PTSD). If Yes, ask: How was this problem treated? (e.g., medicines, counseling, etc.)     Yes, I've dealt with it since I was a teenager  7. RISK OF HARM - SUICIDAL IDEATION: Do you ever have thoughts of hurting or killing yourself? If Yes, ask:  Do you have these feelings now? Do you have a plan on how you would do this?     Denies  8. TREATMENT:  What has been done so far to treat this anxiety? (e.g., medicines, relaxation strategies). What has helped?     States she is taking all of her meds as prescribed  10. POTENTIAL TRIGGERS: Do you drink caffeinated beverages (e.g., coffee, colas, teas), and how much daily? Do you drink alcohol or use any drugs? Have you started any new medicines recently?       Grief, her father died 06/14/2024  44. PATIENT SUPPORT: Who is with you now? Who do you live with? Do you have family or friends who you can talk to?        Daughter and grandchildren  58. OTHER SYMPTOMS: Do you have any other symptoms? (e.g., feeling depressed, trouble concentrating, trouble sleeping, trouble breathing, palpitations or fast heartbeat, chest pain, sweating, nausea, or diarrhea)      Has lost 10 lbs, diarrhea  Out of xanax  for 1 month, wants a non narcotic for her anxiety and racing thoughts Hx of anxiety and depression since teenage  years, schizophrenia and bipolar disorder per chart Recent ED visit for SI in October  Protocols used: Anxiety and Panic Attack-A-AH

## 2024-05-26 ENCOUNTER — Ambulatory Visit

## 2024-05-26 DIAGNOSIS — F411 Generalized anxiety disorder: Secondary | ICD-10-CM | POA: Diagnosis not present

## 2024-05-28 ENCOUNTER — Inpatient Hospital Stay (HOSPITAL_COMMUNITY)
Admission: EM | Admit: 2024-05-28 | Discharge: 2024-05-31 | DRG: 433 | Disposition: A | Discharge status: Home / Self Care | Attending: Internal Medicine | Admitting: Internal Medicine

## 2024-05-28 ENCOUNTER — Emergency Department (HOSPITAL_COMMUNITY)

## 2024-05-28 ENCOUNTER — Other Ambulatory Visit: Payer: Self-pay

## 2024-05-28 ENCOUNTER — Encounter (HOSPITAL_COMMUNITY): Payer: Self-pay

## 2024-05-28 ENCOUNTER — Inpatient Hospital Stay (HOSPITAL_COMMUNITY)

## 2024-05-28 DIAGNOSIS — K7682 Hepatic encephalopathy: Secondary | ICD-10-CM | POA: Diagnosis present

## 2024-05-28 DIAGNOSIS — E871 Hypo-osmolality and hyponatremia: Secondary | ICD-10-CM | POA: Diagnosis not present

## 2024-05-28 DIAGNOSIS — F13239 Sedative, hypnotic or anxiolytic dependence with withdrawal, unspecified: Secondary | ICD-10-CM | POA: Diagnosis present

## 2024-05-28 DIAGNOSIS — N179 Acute kidney failure, unspecified: Principal | ICD-10-CM | POA: Diagnosis present

## 2024-05-28 DIAGNOSIS — J449 Chronic obstructive pulmonary disease, unspecified: Secondary | ICD-10-CM | POA: Diagnosis present

## 2024-05-28 DIAGNOSIS — E039 Hypothyroidism, unspecified: Secondary | ICD-10-CM | POA: Diagnosis present

## 2024-05-28 DIAGNOSIS — E870 Hyperosmolality and hypernatremia: Secondary | ICD-10-CM | POA: Diagnosis not present

## 2024-05-28 DIAGNOSIS — Z59868 Other specified financial insecurity: Secondary | ICD-10-CM

## 2024-05-28 DIAGNOSIS — Z5941 Food insecurity: Secondary | ICD-10-CM

## 2024-05-28 DIAGNOSIS — G8929 Other chronic pain: Secondary | ICD-10-CM | POA: Diagnosis present

## 2024-05-28 DIAGNOSIS — K7460 Unspecified cirrhosis of liver: Principal | ICD-10-CM | POA: Diagnosis present

## 2024-05-28 DIAGNOSIS — K7290 Hepatic failure, unspecified without coma: Secondary | ICD-10-CM | POA: Diagnosis not present

## 2024-05-28 DIAGNOSIS — E861 Hypovolemia: Secondary | ICD-10-CM | POA: Diagnosis present

## 2024-05-28 DIAGNOSIS — Z1152 Encounter for screening for COVID-19: Secondary | ICD-10-CM | POA: Diagnosis not present

## 2024-05-28 DIAGNOSIS — K219 Gastro-esophageal reflux disease without esophagitis: Secondary | ICD-10-CM | POA: Diagnosis present

## 2024-05-28 DIAGNOSIS — F17210 Nicotine dependence, cigarettes, uncomplicated: Secondary | ICD-10-CM | POA: Diagnosis present

## 2024-05-28 DIAGNOSIS — E872 Acidosis, unspecified: Secondary | ICD-10-CM | POA: Diagnosis present

## 2024-05-28 DIAGNOSIS — F418 Other specified anxiety disorders: Secondary | ICD-10-CM

## 2024-05-28 DIAGNOSIS — Z8051 Family history of malignant neoplasm of kidney: Secondary | ICD-10-CM

## 2024-05-28 DIAGNOSIS — Z885 Allergy status to narcotic agent status: Secondary | ICD-10-CM | POA: Diagnosis not present

## 2024-05-28 DIAGNOSIS — I10 Essential (primary) hypertension: Secondary | ICD-10-CM | POA: Diagnosis present

## 2024-05-28 DIAGNOSIS — Z91040 Latex allergy status: Secondary | ICD-10-CM | POA: Diagnosis not present

## 2024-05-28 DIAGNOSIS — F32A Depression, unspecified: Secondary | ICD-10-CM | POA: Diagnosis present

## 2024-05-28 DIAGNOSIS — Z79890 Hormone replacement therapy (postmenopausal): Secondary | ICD-10-CM

## 2024-05-28 DIAGNOSIS — I129 Hypertensive chronic kidney disease with stage 1 through stage 4 chronic kidney disease, or unspecified chronic kidney disease: Secondary | ICD-10-CM | POA: Diagnosis present

## 2024-05-28 DIAGNOSIS — Z634 Disappearance and death of family member: Secondary | ICD-10-CM | POA: Diagnosis not present

## 2024-05-28 DIAGNOSIS — Z79899 Other long term (current) drug therapy: Secondary | ICD-10-CM

## 2024-05-28 DIAGNOSIS — J029 Acute pharyngitis, unspecified: Secondary | ICD-10-CM | POA: Diagnosis present

## 2024-05-28 DIAGNOSIS — D72829 Elevated white blood cell count, unspecified: Secondary | ICD-10-CM | POA: Diagnosis present

## 2024-05-28 DIAGNOSIS — R509 Fever, unspecified: Secondary | ICD-10-CM | POA: Diagnosis not present

## 2024-05-28 DIAGNOSIS — R7989 Other specified abnormal findings of blood chemistry: Secondary | ICD-10-CM | POA: Diagnosis present

## 2024-05-28 DIAGNOSIS — E876 Hypokalemia: Secondary | ICD-10-CM | POA: Diagnosis not present

## 2024-05-28 DIAGNOSIS — G894 Chronic pain syndrome: Secondary | ICD-10-CM

## 2024-05-28 DIAGNOSIS — Z91148 Patient's other noncompliance with medication regimen for other reason: Secondary | ICD-10-CM

## 2024-05-28 DIAGNOSIS — F419 Anxiety disorder, unspecified: Secondary | ICD-10-CM | POA: Diagnosis present

## 2024-05-28 DIAGNOSIS — N183 Chronic kidney disease, stage 3 unspecified: Secondary | ICD-10-CM | POA: Diagnosis present

## 2024-05-28 DIAGNOSIS — E8721 Acute metabolic acidosis: Secondary | ICD-10-CM | POA: Diagnosis not present

## 2024-05-28 LAB — URINE DRUG SCREEN
Amphetamines: NEGATIVE
Barbiturates: NEGATIVE
Benzodiazepines: NEGATIVE
Cocaine: NEGATIVE
Fentanyl: NEGATIVE
Methadone Scn, Ur: NEGATIVE
Opiates: NEGATIVE
Tetrahydrocannabinol: NEGATIVE

## 2024-05-28 LAB — CBC WITH DIFFERENTIAL/PLATELET
Abs Immature Granulocytes: 0.09 K/uL — ABNORMAL HIGH (ref 0.00–0.07)
Basophils Absolute: 0.1 K/uL (ref 0.0–0.1)
Basophils Relative: 1 %
Eosinophils Absolute: 0 K/uL (ref 0.0–0.5)
Eosinophils Relative: 0 %
HCT: 35.1 % — ABNORMAL LOW (ref 36.0–46.0)
Hemoglobin: 12.1 g/dL (ref 12.0–15.0)
Immature Granulocytes: 1 %
Lymphocytes Relative: 11 %
Lymphs Abs: 1.9 K/uL (ref 0.7–4.0)
MCH: 29.4 pg (ref 26.0–34.0)
MCHC: 34.5 g/dL (ref 30.0–36.0)
MCV: 85.4 fL (ref 80.0–100.0)
Monocytes Absolute: 1.9 K/uL — ABNORMAL HIGH (ref 0.1–1.0)
Monocytes Relative: 12 %
Neutro Abs: 12.3 K/uL — ABNORMAL HIGH (ref 1.7–7.7)
Neutrophils Relative %: 75 %
Platelets: 376 K/uL (ref 150–400)
RBC: 4.11 MIL/uL (ref 3.87–5.11)
RDW: 14.8 % (ref 11.5–15.5)
WBC: 16.2 K/uL — ABNORMAL HIGH (ref 4.0–10.5)
nRBC: 0 % (ref 0.0–0.2)

## 2024-05-28 LAB — COMPREHENSIVE METABOLIC PANEL WITH GFR
ALT: 48 U/L — ABNORMAL HIGH (ref 0–44)
AST: 136 U/L — ABNORMAL HIGH (ref 15–41)
Albumin: 4.2 g/dL (ref 3.5–5.0)
Alkaline Phosphatase: 276 U/L — ABNORMAL HIGH (ref 38–126)
Anion gap: 24 — ABNORMAL HIGH (ref 5–15)
BUN: 49 mg/dL — ABNORMAL HIGH (ref 8–23)
CO2: 18 mmol/L — ABNORMAL LOW (ref 22–32)
Calcium: 9.8 mg/dL (ref 8.9–10.3)
Chloride: 102 mmol/L (ref 98–111)
Creatinine, Ser: 4.5 mg/dL — ABNORMAL HIGH (ref 0.44–1.00)
GFR, Estimated: 10 mL/min — ABNORMAL LOW
Glucose, Bld: 126 mg/dL — ABNORMAL HIGH (ref 70–99)
Potassium: 2.4 mmol/L — CL (ref 3.5–5.1)
Sodium: 144 mmol/L (ref 135–145)
Total Bilirubin: 1.4 mg/dL — ABNORMAL HIGH (ref 0.0–1.2)
Total Protein: 7.4 g/dL (ref 6.5–8.1)

## 2024-05-28 LAB — URINALYSIS, W/ REFLEX TO CULTURE (INFECTION SUSPECTED)
Bilirubin Urine: NEGATIVE
Glucose, UA: NEGATIVE mg/dL
Ketones, ur: NEGATIVE mg/dL
Leukocytes,Ua: NEGATIVE
Nitrite: NEGATIVE
Protein, ur: 30 mg/dL — AB
Specific Gravity, Urine: 1.011 (ref 1.005–1.030)
pH: 5 (ref 5.0–8.0)

## 2024-05-28 LAB — AMMONIA: Ammonia: 130 umol/L — ABNORMAL HIGH (ref 9–35)

## 2024-05-28 LAB — I-STAT CG4 LACTIC ACID, ED
Lactic Acid, Venous: 2.2 mmol/L (ref 0.5–1.9)
Lactic Acid, Venous: 1.6 mmol/L (ref 0.5–1.9)

## 2024-05-28 LAB — RESP PANEL BY RT-PCR (RSV, FLU A&B, COVID)  RVPGX2
Influenza A by PCR: NEGATIVE
Influenza B by PCR: NEGATIVE
Resp Syncytial Virus by PCR: NEGATIVE
SARS Coronavirus 2 by RT PCR: NEGATIVE

## 2024-05-28 LAB — PROTIME-INR
INR: 1.2 (ref 0.8–1.2)
Prothrombin Time: 15.6 s — ABNORMAL HIGH (ref 11.4–15.2)

## 2024-05-28 LAB — ETHANOL: Alcohol, Ethyl (B): <15 mg/dL

## 2024-05-28 MED ORDER — MAGNESIUM SULFATE 2 GM/50ML IV SOLN
2.0000 g | Freq: Once | INTRAVENOUS | Status: AC
  Administered 2024-05-28: 2 g via INTRAVENOUS
  Filled 2024-05-28: qty 50

## 2024-05-28 MED ORDER — LACTATED RINGERS IV SOLN: INTRAVENOUS | Status: DC

## 2024-05-28 MED ORDER — AMLODIPINE BESYLATE 10 MG PO TABS
10.0000 mg | ORAL_TABLET | Freq: Every day | ORAL | Status: DC
  Administered 2024-05-29 – 2024-05-31 (×3): 10 mg via ORAL
  Filled 2024-05-28 (×3): qty 1

## 2024-05-28 MED ORDER — POTASSIUM CHLORIDE 20 MEQ PO PACK: 40.0000 meq | PACK | Freq: Two times a day (BID) | ORAL | Status: DC

## 2024-05-28 MED ORDER — SODIUM CHLORIDE 0.9 % IV BOLUS
1000.0000 mL | Freq: Once | INTRAVENOUS | Status: AC
  Administered 2024-05-28: 1000 mL via INTRAVENOUS

## 2024-05-28 MED ORDER — DULOXETINE HCL 60 MG PO CPEP
60.0000 mg | ORAL_CAPSULE | Freq: Every day | ORAL | Status: DC
  Administered 2024-05-29 – 2024-05-31 (×3): 60 mg via ORAL
  Filled 2024-05-28 (×3): qty 1

## 2024-05-28 MED ORDER — LACTATED RINGERS IV BOLUS
500.0000 mL | Freq: Once | INTRAVENOUS | Status: AC
  Administered 2024-05-28: 500 mL via INTRAVENOUS

## 2024-05-28 MED ORDER — LACTULOSE 10 GM/15ML PO SOLN
30.0000 g | Freq: Three times a day (TID) | ORAL | Status: DC
  Administered 2024-05-28 – 2024-05-30 (×5): 30 g via ORAL
  Filled 2024-05-28 (×3): qty 45
  Filled 2024-05-28: qty 60
  Filled 2024-05-28 (×2): qty 45

## 2024-05-28 MED ORDER — VENLAFAXINE HCL ER 75 MG PO CP24
75.0000 mg | ORAL_CAPSULE | Freq: Every day | ORAL | Status: DC
  Administered 2024-05-29 – 2024-05-31 (×3): 75 mg via ORAL
  Filled 2024-05-28 (×3): qty 1

## 2024-05-28 MED ORDER — LACTULOSE 10 GM/15ML PO SOLN
10.0000 g | Freq: Once | ORAL | Status: AC
  Administered 2024-05-28: 10 g via ORAL
  Filled 2024-05-28: qty 30

## 2024-05-28 MED ORDER — POTASSIUM CHLORIDE 10 MEQ/100ML IV SOLN
10.0000 meq | Freq: Once | INTRAVENOUS | Status: AC
  Administered 2024-05-28: 10 meq via INTRAVENOUS
  Filled 2024-05-28: qty 100

## 2024-05-28 MED ORDER — METOPROLOL TARTRATE 25 MG PO TABS
25.0000 mg | ORAL_TABLET | Freq: Two times a day (BID) | ORAL | Status: DC
  Administered 2024-05-28 – 2024-05-31 (×6): 25 mg via ORAL
  Filled 2024-05-28 (×7): qty 1

## 2024-05-28 MED ORDER — LEVOTHYROXINE SODIUM 25 MCG PO TABS
25.0000 ug | ORAL_TABLET | Freq: Every day | ORAL | Status: DC
  Administered 2024-05-29 – 2024-05-31 (×3): 25 ug via ORAL
  Filled 2024-05-28 (×3): qty 1

## 2024-05-28 NOTE — ED Notes (Signed)
 Attempted to call report to 29M RN x2 with no answer.

## 2024-05-28 NOTE — ED Provider Notes (Signed)
 " [name] EMERGENCY DEPARTMENT AT [facility] Provider Note   CSN: [id number] Arrival date & time: 05/28/24  1454     Patient presents with: [name]   [name] is a 63 y.o. female.  With a history of cirrhosis who presents to ED for altered mental status.  Patient suffered the loss of family ember recently and has not been taking her medications including lactulose  at home.  Daughter noted her to be confused today covered in bruises.  No fevers or other overt infectious symptoms.  The patient herself remains confused unable to provide additional history at this time    Fall       Prior to Admission medications  Medication Sig Start Date End Date Taking? Authorizing Provider  ALPRAZolam  (XANAX ) 1 MG tablet Take 1 mg by mouth 3 (three) times daily as needed for anxiety.    [provider]  amLODipine  (NORVASC ) 10 MG tablet Take 1 tablet (10 mg total) by mouth daily. 04/20/24   Bevely Doffing, FNP  diphenoxylate-atropine (LOMOTIL) 2.5-0.025 MG tablet Take 2 tablets by mouth 4 (four) times daily. Patient taking differently: Take 2 tablets by mouth 4 (four) times daily as needed. 07/25/23   [provider]  DULoxetine  (CYMBALTA ) 30 MG capsule Take 1 capsule (30 mg total) by mouth daily. 04/20/24   Bevely Doffing, FNP  DULoxetine  (CYMBALTA ) 60 MG capsule Take 1 capsule (60 mg total) by mouth daily. 04/20/24   Bevely Doffing, FNP  estradiol  (CLIMARA  - DOSED IN MG/24 HR) 0.05 mg/24hr patch Place 1 patch (0.05 mg total) onto the skin once a week. 04/20/24   Bevely Doffing, FNP  hydrochlorothiazide  (MICROZIDE ) 12.5 MG capsule Take 1 capsule (12.5 mg total) by mouth daily. 04/20/24   Bevely Doffing, FNP  lactulose  (CHRONULAC ) 10 GM/15ML solution Take 20 g by mouth 2 (two) times daily. 03/23/24   [provider]  levothyroxine  (SYNTHROID ) 25 MCG tablet Take 1 tablet (25 mcg total) by mouth daily before breakfast. 04/20/24   Bevely Doffing, FNP   methocarbamol  (ROBAXIN ) 500 MG tablet Take 500 mg by mouth 3 (three) times daily. 03/23/24   [provider]  metoCLOPramide  (REGLAN ) 5 MG tablet Take 5 mg by mouth 4 (four) times daily. 08/13/22   [provider]  metoprolol  tartrate (LOPRESSOR ) 25 MG tablet Take 1 tablet (25 mg total) by mouth 2 (two) times daily. 04/20/24   Bevely Doffing, FNP  pantoprazole  (PROTONIX ) 40 MG tablet Take 1 tablet (40 mg total) by mouth 2 (two) times daily before a meal. 02/24/24   Ezzard Sonny RAMAN, PA-C  promethazine  (PHENERGAN ) 25 MG tablet Take 25 mg by mouth 4 (four) times daily as needed. 02/11/24   [provider]  venlafaxine  XR (EFFEXOR -XR) 75 MG 24 hr capsule Take 1 capsule (75 mg total) by mouth daily with breakfast. 04/20/24   Bevely Doffing, FNP  zolpidem  (AMBIEN ) 5 MG tablet Take 1 tablet (5 mg total) by mouth at bedtime. 04/20/24   Bevely Doffing, FNP    Allergies: Codeine, Flexeril  [cyclobenzaprine ], Latex, Lyrica [pregabalin], Nsaids, and Other    Review of Systems  Updated Vital Signs BP (!) 132/58   Pulse (!) 115   Temp 97.7 F (36.5 C) (Oral)   Resp 16   Ht 5' 6 (1.676 m)   Wt 90.7 kg   SpO2 100%   BMI 32.28 kg/m   Physical Exam Vitals and nursing note reviewed.  HENT:     Head: Normocephalic and atraumatic.  Eyes:  Pupils: Pupils are equal, round, and reactive to light.  Cardiovascular:     Rate and Rhythm: Normal rate and regular rhythm.  Pulmonary:     Effort: Pulmonary effort is normal.     Breath sounds: Normal breath sounds.  Abdominal:     Palpations: Abdomen is soft.     Tenderness: There is no abdominal tenderness.  Skin:    General: Skin is warm and dry.     Comments: Diffuse bruising  Neurological:     Mental Status: She is alert.     Sensory: No sensory deficit.     Motor: No weakness.     Comments: Asterixis of both hands Oriented to self only Confused speech  Psychiatric:        Mood and Affect: Mood normal.     (all  labs ordered are listed, but only abnormal results are displayed) Labs Reviewed  COMPREHENSIVE METABOLIC PANEL WITH GFR - Abnormal; Notable for the following components:      Result Value   Potassium 2.4 (*)    CO2 18 (*)    Glucose, Bld 126 (*)    BUN 49 (*)    Creatinine, Ser 4.50 (*)    AST 136 (*)    ALT 48 (*)    Alkaline Phosphatase 276 (*)    Total Bilirubin 1.4 (*)    GFR, Estimated 10 (*)    Anion gap 24 (*)    All other components within normal limits  CBC WITH DIFFERENTIAL/PLATELET - Abnormal; Notable for the following components:   WBC 16.2 (*)    HCT 35.1 (*)    Neutro Abs 12.3 (*)    Monocytes Absolute 1.9 (*)    Abs Immature Granulocytes 0.09 (*)    All other components within normal limits  URINALYSIS, W/ REFLEX TO CULTURE (INFECTION SUSPECTED) - Abnormal; Notable for the following components:   APPearance HAZY (*)    Hgb urine dipstick MODERATE (*)    Protein, ur 30 (*)    Bacteria, UA RARE (*)    All other components within normal limits  AMMONIA - Abnormal; Notable for the following components:   Ammonia 130 (*)    All other components within normal limits  PROTIME-INR - Abnormal; Notable for the following components:   Prothrombin Time 15.6 (*)    All other components within normal limits  I-STAT CG4 LACTIC ACID, ED - Abnormal; Notable for the following components:   Lactic Acid, Venous 2.2 (*)    All other components within normal limits  RESP PANEL BY RT-PCR (RSV, FLU A&B, COVID)  RVPGX2  URINE DRUG SCREEN  ETHANOL  I-STAT CG4 LACTIC ACID, ED    EKG: None  Radiology: DG Chest Portable 1 View Result Date: 05/28/2024 CLINICAL DATA:  Unwitnessed fall.  Question pneumonia. EXAM: PORTABLE CHEST 1 VIEW COMPARISON:  09/18/2023 FINDINGS: The cardiomediastinal contours are normal. The lungs are clear. Pulmonary vasculature is normal. No consolidation, pleural effusion, or pneumothorax. No acute osseous abnormalities are seen. IMPRESSION: No acute chest  findings.  No evidence of pneumonia. Electronically Signed   By: Andrea Gasman M.D.   On: 05/28/2024 17:24   CT HEAD WO CONTRAST Result Date: 05/28/2024 EXAM: CT HEAD WITHOUT CONTRAST 05/28/2024 04:25:00 PM TECHNIQUE: CT of the head was performed without the administration of intravenous contrast. Automated exposure control, iterative reconstruction, and/or weight based adjustment of the mA/kV was utilized to reduce the radiation dose to as low as reasonably achievable. COMPARISON: CT head 02/09/2021 CLINICAL HISTORY:  Head trauma, abnormal mental status (Age 68-64y) FINDINGS: BRAIN AND VENTRICLES: No acute hemorrhage. No evidence of acute infarct. No hydrocephalus. No extra-axial collection. No mass effect or midline shift. ORBITS: No acute abnormality. SINUSES: No acute abnormality. SOFT TISSUES AND SKULL: No acute soft tissue abnormality. No skull fracture. IMPRESSION: 1. No acute intracranial abnormality. Electronically signed by: Gilmore Molt 05/28/2024 04:44 PM EST RP Workstation: [id number]     .Critical Care  Performed by: Pamella Ozell LABOR, DO Authorized by: Pamella Ozell LABOR, DO   Critical care provider statement:    Critical care time (minutes):  30   Critical care was necessary to treat or prevent imminent or life-threatening deterioration of the following conditions:  Hepatic failure and metabolic crisis   Critical care was time spent personally by me on the following activities:  Development of treatment plan with patient or surrogate, discussions with consultants, evaluation of patient's response to treatment, examination of patient, ordering and review of laboratory studies, ordering and review of radiographic studies, ordering and performing treatments and interventions, pulse oximetry, re-evaluation of patient's condition and review of old charts   I assumed direction of critical care for this patient from another provider in my specialty: no     Care discussed with: admitting  provider      Medications Ordered in the ED  potassium chloride  (KLOR-CON ) packet 40 mEq (has no administration in time range)  sodium chloride  0.9 % bolus 1,000 mL (0 mLs Intravenous Stopped 05/28/24 1819)  lactulose  (CHRONULAC ) 10 GM/15ML solution 10 g (10 g Oral Given 05/28/24 1712)  potassium chloride  10 mEq in 100 mL IVPB (0 mEq Intravenous Stopped 05/28/24 2035)  magnesium  sulfate IVPB 2 g 50 mL (0 g Intravenous Stopped 05/28/24 2034)    Clinical Course as of 05/28/24 2052  Thu May 28, 2024  1930 Renal injury.  Critical hypokalemia of 2.4.  Will repeat potassium and magnesium .  Elevated ammonia level.  Will administer lactulose .  No evidence of traumatic findings on CT head no evidence of pneumonia viral respiratory illness or UTI.    Discussed with admitting hospitalist who will admit patient for admission. [MP]    Clinical Course User Index [MP] Pamella Ozell LABOR, DO                                 Medical Decision Making 63 year old female with history as above presented to the ED for concern of altered mental status bruising.  Has been noncompliant with lactulose  at home.  Appears encephalopathic on my assessment with asterixis.  Will obtain laboratory workup including ammonia level along with CT head to evaluate for traumatic injury.  Admission likely  Amount and/or Complexity of Data Reviewed Labs: ordered. Radiology: ordered.  Risk Prescription drug management. Decision regarding hospitalization.        Final diagnoses:  Hepatic encephalopathy [facility])  Hypokalemia    ED Discharge Orders     None          Pamella Ozell LABOR, DO 05/28/24 2052  "

## 2024-05-28 NOTE — ED Triage Notes (Signed)
 Pt to er, pt states that today is christmas and [place] is at [facility].  Pt states that she is here because she fell.  Per ed tech family states that pt was found down and they don't know when she fell, pt has multiple bruises on her arms and legs in various stages of healing.

## 2024-05-28 NOTE — ED Notes (Signed)
 Lactic results to paden b.rn by at

## 2024-05-28 NOTE — H&P (Signed)
 " History and Physical    [name] FMW:[id number] DOB: May 16, 1961 DOA: 05/28/2024  Patient coming from: Home.  History obtained from patient's daughter and prior records.  Chief Complaint: Confusion.  HPI: [name] is a 63 y.o. female with history of liver cirrhosis of unknown cause, hypertension, hypothyroidism, depression was brought to the ER after patient was found to be confused at home.  Patient's daughter states that patient's father died about 5 days ago and since then she has not been taking her medications and eating.  They last talked to her on the phone yesterday when patient appeared normal.  Since she was not answering her phone today family went to check on her and she was appearing confused and had bruises and ecchymotic areas on her extremities.  Patient appeared confused and was brought to the ER.  Patient denies any nausea vomiting diarrhea or taking any NSAIDs.  ED Course: In the ER patient appeared tachycardic confused with some asterixis.  CT head is unremarkable.  Labs show creatinine of 4.5 bicarb of 18 alkaline phosphatase 276 AST 136 ALT 48 total bili 1.4 lactic acid initially was 2.2 improved with fluids WBC 16.2 platelets 376 INR 1.2 ammonia level 130.  UA is negative for ketones leukocyte esterase and nitrites.  Mild protein.  Patient was given lactulose  IV fluids admitted for acute renal failure with hepatic encephalopathy.  Review of Systems: As per HPI, rest all negative.   Past Medical History:  Diagnosis Date   Anemia    Anxiety    Asthma    Cirrhosis (HCC)    Cirrhosis (HCC)    COPD (chronic obstructive pulmonary disease) (HCC)    Depression    Disc degeneration, lumbar    GERD (gastroesophageal reflux disease)    Hypertension    Hypothyroidism    Leukocytosis    Lumbar disc disease    Portal hypertension (HCC)    Shortness of breath    Thrombocytosis     Past Surgical History:  Procedure Laterality Date   ABDOMINAL  HYSTERECTOMY     APPENDECTOMY     BACK SURGERY     BIOPSY  03/25/2014   Procedure: GASTRIC BIOPSIES;  Surgeon: Lamar CHRISTELLA Hollingshead, MD;  Location: [facility];  Service: Endoscopy;;   COLONOSCOPY  02/21/2007   MFM:Jwjo papilla and internal hemorrhoids, diminutive rectal polyp/Left-sided diverticula (sigmoid) pedunculated polyps mid descending   COLONOSCOPY  06/04/2006   Dr. Hollingshead: segmental biopsy negative for microscopic colitis. Three polyps, one tubular adenoma. Surveillance due 2013.    COLONOSCOPY N/A 03/04/2024   Procedure: COLONOSCOPY;  Surgeon: Hollingshead Lamar CHRISTELLA, MD;  Location: [facility];  Service: Endoscopy;  Laterality: N/A;  10:00 am, asa 3   COLONOSCOPY WITH PROPOFOL  N/A 03/25/2014   RMR: Rectal and colonic polyps removed as described above.  Colonic diverticulosis. CT abnormality likely artifactual in orgin     COLONOSCOPY WITH PROPOFOL  N/A 07/04/2022   Procedure: COLONOSCOPY WITH PROPOFOL ;  Surgeon: Hollingshead Lamar CHRISTELLA, MD;  Location: [facility];  Service: Endoscopy;  Laterality: N/A;  10:15 am   ESOPHAGEAL DILATION N/A 03/04/2024   Procedure: DILATION, ESOPHAGUS;  Surgeon: Hollingshead Lamar CHRISTELLA, MD;  Location: [facility];  Service: Endoscopy;  Laterality: N/A;   ESOPHAGOGASTRODUODENOSCOPY  08/08/2005   WLM:Wnmfjo esophagogastroduodenoscopy.   ESOPHAGOGASTRODUODENOSCOPY  06/04/2009   Dr. Hollingshead: normal esophagus, small hiatal hernia, duodenal diverticulum, negative H.pylori and celiac   ESOPHAGOGASTRODUODENOSCOPY N/A 03/04/2024   Procedure: EGD (ESOPHAGOGASTRODUODENOSCOPY);  Surgeon: Hollingshead Lamar CHRISTELLA, MD;  Location: [facility];  Service: Endoscopy;  Laterality: N/A;   ESOPHAGOGASTRODUODENOSCOPY (EGD) WITH PROPOFOL  N/A 03/25/2014   RMR: Probable occult cervical esophageal web- status post dilation as described above. Abnormal gastic mucosa of uncertain significance-status post gasrtric biopsy   ESOPHAGOGASTRODUODENOSCOPY (EGD) WITH PROPOFOL  N/A 07/04/2022   Procedure:  ESOPHAGOGASTRODUODENOSCOPY (EGD) WITH PROPOFOL ;  Surgeon: Shaaron Lamar HERO, MD;  [facility]: [facility];  Service: Endoscopy;  Laterality: N/A;   FLEXIBLE SIGMOIDOSCOPY N/A 09/05/2022   Procedure: FLEXIBLE SIGMOIDOSCOPY;  Surgeon: Shaaron Lamar HERO, MD;  Location: [facility];  Service: Endoscopy;  Laterality: N/A;   LIPOMA EXCISION N/A 11/23/2022   Procedure: Removal of lipoma, L5 paraspinous soft tissue;  Surgeon: Gillie Duncans, MD;  Location: [facility];  Service: Neurosurgery;  Laterality: N/A;  RM 21 3C   MALONEY DILATION N/A 03/25/2014   Procedure: MALONEY ESOPHAGEAL DILATION #54 Fr;  Surgeon: Lamar HERO Shaaron, MD;  Location: [facility];  Service: Endoscopy;  Laterality: N/A;   POLYPECTOMY  03/25/2014   Procedure: SIGMOID AND RECTAL POLYPECTOMY;  Surgeon: Lamar HERO Shaaron, MD;  Location: [facility];  Service: Endoscopy;;   right knee surgery     rt breast biopsy     TOTAL ABDOMINAL HYSTERECTOMY W/ BILATERAL SALPINGOOPHORECTOMY     age 50     reports that she has been smoking cigarettes. She has a 15 pack-year smoking history. She has never used smokeless tobacco. She reports that she does not drink alcohol and does not use drugs.  Allergies[1]  Family History  Adopted: Yes  Problem Relation Age of Onset   Pancreatic cancer Mother    Leukemia Sister        1/2 sister   Hepatitis C Brother    Liver cancer Brother        suicide   Colon cancer Brother        75   Cirrhosis Brother        etoh use   Pancreatic cancer Paternal Grandmother    Kidney cancer Paternal Grandfather    Pancreatic cancer Maternal Aunt    Kidney cancer Maternal Uncle    Pancreatic cancer Maternal Uncle    Colon cancer Paternal Aunt    Breast cancer Neg Hx     Prior to Admission medications  Medication Sig Start Date End Date Taking? Authorizing Provider  ALPRAZolam  (XANAX ) 1 MG tablet Take 1 mg by mouth 3 (three) times daily as needed for anxiety.    [provider]  amLODipine  (NORVASC ) 10 MG tablet Take  1 tablet (10 mg total) by mouth daily. 04/20/24   Bevely Doffing, FNP  diphenoxylate-atropine (LOMOTIL) 2.5-0.025 MG tablet Take 2 tablets by mouth 4 (four) times daily. Patient taking differently: Take 2 tablets by mouth 4 (four) times daily as needed. 07/25/23   [provider]  DULoxetine  (CYMBALTA ) 30 MG capsule Take 1 capsule (30 mg total) by mouth daily. 04/20/24   Bevely Doffing, FNP  DULoxetine  (CYMBALTA ) 60 MG capsule Take 1 capsule (60 mg total) by mouth daily. 04/20/24   Bevely Doffing, FNP  estradiol  (CLIMARA  - DOSED IN MG/24 HR) 0.05 mg/24hr patch Place 1 patch (0.05 mg total) onto the skin once a week. 04/20/24   Bevely Doffing, FNP  hydrochlorothiazide  (MICROZIDE ) 12.5 MG capsule Take 1 capsule (12.5 mg total) by mouth daily. 04/20/24   Bevely Doffing, FNP  lactulose  (CHRONULAC ) 10 GM/15ML solution Take 20 g by mouth 2 (two) times daily. 03/23/24   [provider]  levothyroxine  (SYNTHROID ) 25  MCG tablet Take 1 tablet (25 mcg total) by mouth daily before breakfast. 04/20/24   Bevely Doffing, FNP  methocarbamol  (ROBAXIN ) 500 MG tablet Take 500 mg by mouth 3 (three) times daily. 03/23/24   [provider]  metoCLOPramide  (REGLAN ) 5 MG tablet Take 5 mg by mouth 4 (four) times daily. 08/13/22   [provider]  metoprolol  tartrate (LOPRESSOR ) 25 MG tablet Take 1 tablet (25 mg total) by mouth 2 (two) times daily. 04/20/24   Bevely Doffing, FNP  pantoprazole  (PROTONIX ) 40 MG tablet Take 1 tablet (40 mg total) by mouth 2 (two) times daily before a meal. 02/24/24   Ezzard Sonny RAMAN, PA-C  promethazine  (PHENERGAN ) 25 MG tablet Take 25 mg by mouth 4 (four) times daily as needed. 02/11/24   [provider]  venlafaxine  XR (EFFEXOR -XR) 75 MG 24 hr capsule Take 1 capsule (75 mg total) by mouth daily with breakfast. 04/20/24   Bevely Doffing, FNP  zolpidem  (AMBIEN ) 5 MG tablet Take 1 tablet (5 mg total) by mouth at bedtime. 04/20/24   Bevely Doffing, FNP     Physical Exam: Constitutional: Moderately built and nourished. Vitals:   05/28/24 1745 05/28/24 1815 05/28/24 1845 05/28/24 1945  BP: (!) 163/75 (!) 158/87 (!) 146/74 (!) 132/58  Pulse: (!) 119 (!) 116 (!) 118 (!) 115  Resp: (!) 27 18 20 16   Temp:      TempSrc:      SpO2: 100% 100% 100% 100%  Weight:      Height:       Eyes: Anicteric no pallor. ENMT: No discharge from the ears eyes nose or mouth. Neck: No mass felt.  No neck rigidity. Respiratory: No rhonchi or crepitations. Cardiovascular: S1-S2 heard. Abdomen: Soft nontender bowel sound present. Musculoskeletal: No edema. Skin: No rash. Neurologic: Alert awake oriented to name and place moving all extremities. Psychiatric: Appears mildly confused.   Labs on Admission: I have personally reviewed following labs and imaging studies  CBC: Recent Labs  Lab 05/28/24 1552  WBC 16.2*  NEUTROABS 12.3*  HGB 12.1  HCT 35.1*  MCV 85.4  PLT 376   Basic Metabolic Panel: Recent Labs  Lab 05/28/24 1552  NA 144  K 2.4*  CL 102  CO2 18*  GLUCOSE 126*  BUN 49*  CREATININE 4.50*  CALCIUM 9.8   GFR: Estimated Creatinine Clearance: 14.7 mL/min (A) (by C-G formula based on SCr of 4.5 mg/dL (H)). Liver Function Tests: Recent Labs  Lab 05/28/24 1552  AST 136*  ALT 48*  ALKPHOS 276*  BILITOT 1.4*  PROT 7.4  ALBUMIN 4.2   No results for input(s): LIPASE, AMYLASE in the last 168 hours. Recent Labs  Lab 05/28/24 1552  AMMONIA 130*   Coagulation Profile: Recent Labs  Lab 05/28/24 1638  INR 1.2   Cardiac Enzymes: No results for input(s): CKTOTAL, CKMB, CKMBINDEX, TROPONINI in the last 168 hours. BNP (last 3 results) No results for input(s): PROBNP in the last 8760 hours. HbA1C: No results for input(s): HGBA1C in the last 72 hours. CBG: No results for input(s): GLUCAP in the last 168 hours. Lipid Profile: No results for input(s): CHOL, HDL, LDLCALC, TRIG, CHOLHDL, LDLDIRECT  in the last 72 hours. Thyroid  Function Tests: No results for input(s): TSH, T4TOTAL, FREET4, T3FREE, THYROIDAB in the last 72 hours. Anemia Panel: No results for input(s): VITAMINB12, FOLATE, FERRITIN, TIBC, IRON, RETICCTPCT in the last 72 hours. Urine analysis:    Component Value Date/Time   COLORURINE YELLOW 05/28/2024 1552  APPEARANCEUR HAZY (A) 05/28/2024 1552   LABSPEC 1.011 05/28/2024 1552   PHURINE 5.0 05/28/2024 1552   GLUCOSEU NEGATIVE 05/28/2024 1552   HGBUR MODERATE (A) 05/28/2024 1552   BILIRUBINUR NEGATIVE 05/28/2024 1552   KETONESUR NEGATIVE 05/28/2024 1552   PROTEINUR 30 (A) 05/28/2024 1552   UROBILINOGEN 0.2 04/04/2009 2232   NITRITE NEGATIVE 05/28/2024 1552   LEUKOCYTESUR NEGATIVE 05/28/2024 1552   Sepsis Labs: @LABRCNTIP (procalcitonin:4,lacticidven:4) ) Recent Results (from the past 240 hours)  Resp panel by RT-PCR (RSV, Flu A&B, Covid) Anterior Nasal Swab     Status: None   Collection Time: 05/28/24  4:16 PM   Specimen: Anterior Nasal Swab  Result Value Ref Range Status   SARS Coronavirus 2 by RT PCR NEGATIVE NEGATIVE Final   Influenza A by PCR NEGATIVE NEGATIVE Final   Influenza B by PCR NEGATIVE NEGATIVE Final    Comment: (NOTE) The Xpert Xpress SARS-CoV-2/FLU/RSV plus assay is intended as an aid in the diagnosis of influenza from Nasopharyngeal swab specimens and should not be used as a sole basis for treatment. Nasal washings and aspirates are unacceptable for Xpert Xpress SARS-CoV-2/FLU/RSV testing.  Fact Sheet for Patients: bloggercourse.com  Fact Sheet for Healthcare Providers: seriousbroker.it  This test is not yet approved or cleared by the [place]  FDA and has been authorized for detection and/or diagnosis of SARS-CoV-2 by FDA under an Emergency Use Authorization (EUA). This EUA will remain in effect (meaning this test can be used) for the duration of the COVID-19  declaration under Section 564(b)(1) of the Act, 21 U.S.C. section 360bbb-3(b)(1), unless the authorization is terminated or revoked.     Resp Syncytial Virus by PCR NEGATIVE NEGATIVE Final    Comment: (NOTE) Fact Sheet for Patients: bloggercourse.com  Fact Sheet for Healthcare Providers: seriousbroker.it  This test is not yet approved or cleared by the [place]  FDA and has been authorized for detection and/or diagnosis of SARS-CoV-2 by FDA under an Emergency Use Authorization (EUA). This EUA will remain in effect (meaning this test can be used) for the duration of the COVID-19 declaration under Section 564(b)(1) of the Act, 21 U.S.C. section 360bbb-3(b)(1), unless the authorization is terminated or revoked.  Performed at [facility], [address] [place], [place] [address]      Radiological Exams on Admission: DG Chest Portable 1 View Result Date: 05/28/2024 CLINICAL DATA:  Unwitnessed fall.  Question pneumonia. EXAM: PORTABLE CHEST 1 VIEW COMPARISON:  09/18/2023 FINDINGS: The cardiomediastinal contours are normal. The lungs are clear. Pulmonary vasculature is normal. No consolidation, pleural effusion, or pneumothorax. No acute osseous abnormalities are seen. IMPRESSION: No acute chest findings.  No evidence of pneumonia. Electronically Signed   By: Andrea Gasman M.D.   On: 05/28/2024 17:24   CT HEAD WO CONTRAST Result Date: 05/28/2024 EXAM: CT HEAD WITHOUT CONTRAST 05/28/2024 04:25:00 PM TECHNIQUE: CT of the head was performed without the administration of intravenous contrast. Automated exposure control, iterative reconstruction, and/or weight based adjustment of the mA/kV was utilized to reduce the radiation dose to as low as reasonably achievable. COMPARISON: CT head 02/09/2021 CLINICAL HISTORY: Head trauma, abnormal mental status (Age 27-64y) FINDINGS: BRAIN AND VENTRICLES: No acute hemorrhage. No evidence of  acute infarct. No hydrocephalus. No extra-axial collection. No mass effect or midline shift. ORBITS: No acute abnormality. SINUSES: No acute abnormality. SOFT TISSUES AND SKULL: No acute soft tissue abnormality. No skull fracture. IMPRESSION: 1. No acute intracranial abnormality. Electronically signed by: Gilmore Molt 05/28/2024 04:44 PM EST RP Workstation: [id number]  EKG: Independently reviewed.  Sinus tachycardia.  Assessment/Plan Principal Problem:   ARF (acute renal failure) Active Problems:   Hypothyroidism   Hypokalemia   Hepatic cirrhosis (HCC)   Acute hepatic encephalopathy (HCC)    Acute renal failure on chronic kidney disease stage III with metabolic acidosis baseline creatinine in November 2025 was 1.3.  Presently is 4.5.  Patient is making urine.  Suspect prerenal.  Will check renal imaging to make sure there is no obstruction.  Continue with hydration.  Hold hydrochlorothiazide .  Check urine sodium and creatinine.  Closely follow intake or metabolic panel.  If creatinine does not improve with hydration will need nephrology consult.   Hepatic encephalopathy per family patient has not been taking her medicines last 5 days.  Will keep patient on lactulose  closely follow mental status.  CT head is unremarkable.  Patient is presently afebrile. Cirrhosis of the liver unknown cause follows with Dr. Debbie gastroenterologist in [place].  Has had EGD in October 2025 which did show some portal gastropathy.  MELD score of 23.  Follow CT abdomen pelvis. Elevated LFTs denies any abdominal pain.  Will trend LFTs.  Check acute hepatitis panel.  Follow CT abdomen pelvis.  Denies drinking alcohol. Hypertension takes metoprolol  and amlodipine  which we will restart.  Hold hydrochlorothiazide  due to acute renal failure. History of depression patient states she takes both Cymbalta  and Effexor . Hypothyroidism on Synthroid  check TSH. Hypokalemia replace and recheck. Leukocytosis likely  demargination.  No definite signs of infection.  Patient afebrile.  Will continue to monitor.  Since patient has acute renal failure with hepatic encephalopathy will need close monitoring further management and more than 2 midnight stay.  DVT prophylaxis: SCDs. Code Status: Full code. Family Communication: Patient's daughter. Disposition Plan: Monitored bed. Consults called: Solicitor. Admission status: Inpatient.         [1]  Allergies Allergen Reactions   Codeine Hives and Other (See Comments)    Hallucinations   Flexeril  [Cyclobenzaprine ]     Hallucinations    Latex Hives and Itching   Lyrica [Pregabalin] Hives   Nsaids Nausea And Vomiting   Other Hives and Itching    Oral vitamins   "

## 2024-05-28 NOTE — ED Notes (Signed)
 RN reached out to phlebotomy to stick pt for lactic.

## 2024-05-29 ENCOUNTER — Ambulatory Visit: Payer: Self-pay | Admitting: Family Medicine

## 2024-05-29 DIAGNOSIS — K7460 Unspecified cirrhosis of liver: Secondary | ICD-10-CM

## 2024-05-29 DIAGNOSIS — E8721 Acute metabolic acidosis: Secondary | ICD-10-CM | POA: Diagnosis not present

## 2024-05-29 DIAGNOSIS — K7682 Hepatic encephalopathy: Secondary | ICD-10-CM | POA: Diagnosis not present

## 2024-05-29 DIAGNOSIS — K7290 Hepatic failure, unspecified without coma: Secondary | ICD-10-CM

## 2024-05-29 DIAGNOSIS — E876 Hypokalemia: Secondary | ICD-10-CM | POA: Diagnosis not present

## 2024-05-29 DIAGNOSIS — R509 Fever, unspecified: Secondary | ICD-10-CM | POA: Diagnosis not present

## 2024-05-29 DIAGNOSIS — E871 Hypo-osmolality and hyponatremia: Secondary | ICD-10-CM | POA: Diagnosis not present

## 2024-05-29 DIAGNOSIS — N179 Acute kidney failure, unspecified: Secondary | ICD-10-CM

## 2024-05-29 LAB — CBC WITH DIFFERENTIAL/PLATELET
Abs Immature Granulocytes: 0.08 K/uL — ABNORMAL HIGH (ref 0.00–0.07)
Basophils Absolute: 0.1 K/uL (ref 0.0–0.1)
Basophils Relative: 1 %
Eosinophils Absolute: 0.1 K/uL (ref 0.0–0.5)
Eosinophils Relative: 0 %
HCT: 29.8 % — ABNORMAL LOW (ref 36.0–46.0)
Hemoglobin: 11 g/dL — ABNORMAL LOW (ref 12.0–15.0)
Immature Granulocytes: 1 %
Lymphocytes Relative: 6 %
Lymphs Abs: 0.7 K/uL (ref 0.7–4.0)
MCH: 30.2 pg (ref 26.0–34.0)
MCHC: 36.9 g/dL — ABNORMAL HIGH (ref 30.0–36.0)
MCV: 81.9 fL (ref 80.0–100.0)
Monocytes Absolute: 1.5 K/uL — ABNORMAL HIGH (ref 0.1–1.0)
Monocytes Relative: 12 %
Neutro Abs: 10.5 K/uL — ABNORMAL HIGH (ref 1.7–7.7)
Neutrophils Relative %: 80 %
Platelets: 291 K/uL (ref 150–400)
RBC: 3.64 MIL/uL — ABNORMAL LOW (ref 3.87–5.11)
RDW: 14.6 % (ref 11.5–15.5)
WBC: 12.9 K/uL — ABNORMAL HIGH (ref 4.0–10.5)
nRBC: 0.2 % (ref 0.0–0.2)

## 2024-05-29 LAB — HEPATIC FUNCTION PANEL
ALT: 48 U/L — ABNORMAL HIGH (ref 0–44)
AST: 133 U/L — ABNORMAL HIGH (ref 15–41)
Albumin: 3.6 g/dL (ref 3.5–5.0)
Alkaline Phosphatase: 285 U/L — ABNORMAL HIGH (ref 38–126)
Bilirubin, Direct: 0.7 mg/dL — ABNORMAL HIGH (ref 0.0–0.2)
Indirect Bilirubin: 0.7 mg/dL (ref 0.3–0.9)
Total Bilirubin: 1.3 mg/dL — ABNORMAL HIGH (ref 0.0–1.2)
Total Protein: 6.5 g/dL (ref 6.5–8.1)

## 2024-05-29 LAB — HEPATITIS PANEL, ACUTE
HCV Ab: NONREACTIVE
Hep A IgM: NONREACTIVE
Hep B C IgM: NONREACTIVE
Hepatitis B Surface Ag: NONREACTIVE

## 2024-05-29 LAB — PROTIME-INR
INR: 1.3 — ABNORMAL HIGH (ref 0.8–1.2)
Prothrombin Time: 16.4 s — ABNORMAL HIGH (ref 11.4–15.2)

## 2024-05-29 LAB — BASIC METABOLIC PANEL WITH GFR
Anion gap: 19 — ABNORMAL HIGH (ref 5–15)
BUN: 43 mg/dL — ABNORMAL HIGH (ref 8–23)
CO2: 21 mmol/L — ABNORMAL LOW (ref 22–32)
Calcium: 9.2 mg/dL (ref 8.9–10.3)
Chloride: 106 mmol/L (ref 98–111)
Creatinine, Ser: 3.47 mg/dL — ABNORMAL HIGH (ref 0.44–1.00)
GFR, Estimated: 14 mL/min — ABNORMAL LOW
Glucose, Bld: 134 mg/dL — ABNORMAL HIGH (ref 70–99)
Potassium: 2.2 mmol/L — CL (ref 3.5–5.1)
Sodium: 146 mmol/L — ABNORMAL HIGH (ref 135–145)

## 2024-05-29 LAB — SODIUM, URINE, RANDOM: Sodium, Ur: <30 mmol/L

## 2024-05-29 LAB — MAGNESIUM: Magnesium: 2 mg/dL (ref 1.7–2.4)

## 2024-05-29 LAB — ACETAMINOPHEN LEVEL: Acetaminophen (Tylenol), Serum: <10 ug/mL — ABNORMAL LOW (ref 10–30)

## 2024-05-29 LAB — HIV ANTIBODY (ROUTINE TESTING W REFLEX): HIV Screen 4th Generation wRfx: NONREACTIVE

## 2024-05-29 LAB — TSH: TSH: 0.183 u[IU]/mL — ABNORMAL LOW (ref 0.350–4.500)

## 2024-05-29 LAB — CREATININE, URINE, RANDOM: Creatinine, Urine: <10 mg/dL

## 2024-05-29 LAB — GLUCOSE, CAPILLARY: Glucose-Capillary: 127 mg/dL — ABNORMAL HIGH (ref 70–99)

## 2024-05-29 MED ORDER — POTASSIUM CHLORIDE 10 MEQ/100ML IV SOLN
10.0000 meq | INTRAVENOUS | Status: AC
  Administered 2024-05-29 (×4): 10 meq via INTRAVENOUS
  Filled 2024-05-29 (×4): qty 100

## 2024-05-29 MED ORDER — GLUCOSE 40 % PO GEL: 2.0000 | ORAL | Status: DC

## 2024-05-29 MED ORDER — ALPRAZOLAM 0.25 MG PO TABS
0.2500 mg | ORAL_TABLET | Freq: Two times a day (BID) | ORAL | Status: DC
  Administered 2024-05-29 – 2024-05-31 (×5): 0.25 mg via ORAL
  Filled 2024-05-29 (×5): qty 1

## 2024-05-29 MED ORDER — POTASSIUM CHLORIDE CRYS ER 20 MEQ PO TBCR
40.0000 meq | EXTENDED_RELEASE_TABLET | Freq: Once | ORAL | Status: AC
  Administered 2024-05-29: 40 meq via ORAL
  Filled 2024-05-29: qty 2

## 2024-05-29 MED ORDER — POTASSIUM CHLORIDE IN NACL 40-0.9 MEQ/L-% IV SOLN
INTRAVENOUS | Status: DC
  Filled 2024-05-29 (×4): qty 1000

## 2024-05-29 MED ORDER — METOCLOPRAMIDE HCL 5 MG PO TABS: 5.0000 mg | ORAL_TABLET | Freq: Four times a day (QID) | ORAL | Status: DC | PRN

## 2024-05-29 NOTE — Plan of Care (Signed)

## 2024-05-29 NOTE — Hospital Course (Addendum)
 63 year old white female HTN hypothyroid bipolar chronic pain tobacco abuse Cirrhosis with ascites with prior paracentesis in the past-first diagnosed around 05/2017 and was followed by Dr. Bill--- last MELD score 05/08/2024 in the outpatient setting when followed with Dr. Ivonne office was 11 She has had equivocal testing with previously negative ANA then followed by positive ANA and an intermittently elevated ASMA Previous admission for hypertensive crisis intractable nausea probably from migraine versus gastroenteritis

## 2024-05-29 NOTE — Consult Note (Signed)
 "                                               Consultation Note   Referring Provider:   [facility] PCP: Bevely Doffing, FNP Primary Gastroenterologist:   [facility]      Reason for Consultation: Cirrhosis ; altered mental status DOA: 05/28/2024         Hospital Day: 2   ASSESSMENT    63 year old female with history of decompensated cirrhosis (etiology not clear) with history of hepatic encephalopathy admitted with altered mental status .  No acute findings on head CT .  UDS negative . Low suspicion for infectious process . Probable hepatic encephalopathy but also query component of benzodiazepine withdrawal after additional information received from daughter at bedside today   MELD 3.0: 23 at 05/29/2024  2:59 AM MELD-Na: 22 at 05/29/2024  2:59 AM *MELD elevated in the setting of AKI Patient abruptly self discontinued her lactulose  nearly a week ago.  Daughter present in the room and I was able to get additional history.  Patient has been on  Xanax  2-3 times a day for several years and this was abruptly discontinued nearly a week ago .  I will share this this newly obtained information with the primary team today.    Home lactulose  has already been resumed and mental status improving.    AKI on CKD  Hypokalemia  GERD Takes pantoprazole  as needed at home  Anxiety Depression Was pn chronic benzodiazepine. For unclear reasons she is no longer taking alprazolam  as a several days ago (no longer being prescribed maybe?) this will need to be addressed as outpatient to manage her anxiety.  With a history of hepatic encephalopathy benzodiazepines are not preferable but she was apparently managing okay prior to self discontinuation of lactulose .    History of colon polyps Followed by [facility]  Hypothyroidism  Hypertension  See [facility] for any additional medical history  / medical problems  Principal Problem:   ARF (acute renal failure) Active Problems:   Leukocytosis    Elevated LFTs   Hypothyroidism   Hypokalemia   Hepatic cirrhosis (HCC)   Essential hypertension   Acute hepatic encephalopathy (HCC)    PLAN:   -Continue 3 times daily lactulose  -Discussed case with Hospitalist, there could be a component of benzodiazepine withdrawal following  abrupt cessation of alprazolam  ~ a week ago. Appreciate his help.  -Home diuretics on hold -Continue 2 g sodium restricted diet   HPI   Brief History:  Patient is 63 year old female with a history of decompensated cirrhosis with hepatic encephalopathy.  followed by [facility], last office not from 02/24/2024 reviewed.  Because of cirrhosis not exactly clear. She has a history of intermittently mildly elevated ASMA, chronically elevated alkaline phosphatase, GGT 209. ANA previously negative followed by positive ANA, Anti-GP-210 Ab neg. Anti-SP-100 Ab negative.     Patient presented to the ED yesterday with altered mental status.  Daughter found her at home confused, covered in bruises.  H&P reviewed.  In the ED she was tachycardic with asterixis.  CT head was unremarkable.  Liver chemistries were elevated, white count was elevated, she was admitted with AKI and hepatic encephalopathy.   Daughter [name] in the room and helps with history today.  Patient's father passed away on 07/06/2024.  Patient abruptly discontinued her lactulose  on 07/06/2024.  Patient tells me her father told her that it was okay to stop the lactulose .  Taking the lactulose  she was having 2-3 bowel movements a day.  Per daughter patient has been taking Xanax  2-3 times a day for many years and this was also abruptly stopped on Saturday (?  No longer prescribed).  It does not sound like she has been started on any new medications and the lactulose  and Xanax  are the only ones that have been recently stopped.  Patient has no physical complaints.  She has not seen any blood in her stools.    Admission studies / evaluation notable for:   In the ED  patient was tachycardic at 123, afebrile.    Lactic acid 2.2 , WBC 16  Hemoglobin 12 (at baseline)  SARS, flu A / B negative  K+2.4, BUN 49, creatinine 4.5  Alk Phos 276, AST 136, ALT 48,  T bilirubin 1.4, INR 1.3.  Ammonia 130  UDS negative  Noncontrast CT CHEST ABDOMEN PELVIS  Cirrhotic liver with  recanalized umbilical vein and lower abdominal wall varices consistent with portal hypertension. No splenomegaly or ascites.          Recent Labs    05/28/24 1552 05/29/24 0259  PROT 7.4 6.5  ALBUMIN 4.2 3.6  AST 136* 133*  ALT 48* 48*  ALKPHOS 276* 285*  BILITOT 1.4* 1.3*  BILIDIR  --  0.7*  IBILI  --  0.7   Recent Labs    05/28/24 1552 05/29/24 0259  WBC 16.2* 12.9*  HGB 12.1 11.0*  HCT 35.1* 29.8*  MCV 85.4 81.9  PLT 376 291   Recent Labs    05/28/24 1552 05/29/24 0259  NA 144 146*  K 2.4* 2.2*  CL 102 106  CO2 18* 21*  GLUCOSE 126* 134*  BUN 49* 43*  CREATININE 4.50* 3.47*  CALCIUM 9.8 9.2     CT CHEST ABDOMEN PELVIS WO CONTRAST EXAM: CT CHEST, ABDOMEN AND PELVIS WITHOUT CONTRAST 05/28/2024 09:58:00 PM  TECHNIQUE: CT of the chest, abdomen and pelvis was performed without the administration of intravenous contrast. Multiplanar reformatted images are provided for review. Automated exposure control, iterative reconstruction, and/or weight based adjustment of the mA/kV was utilized to reduce the radiation dose to as low as reasonably achievable.  COMPARISON: Portable chest from today, CT abdomen and pelvis with contrast 09/18/2023 and 07/30/2021, and CTA chest abdomen and pelvis 02/10/2021.  CLINICAL HISTORY: Acute renal failure. The patient fell today and was found down by family, unknown down time.  FINDINGS:  CHEST:  MEDIASTINUM AND LYMPH NODES: Cardiac size is normal. There is no pericardial effusion. The pulmonary arteries and veins, aorta, and great vessels are normal in caliber.  Small amount of calcific plaque in the aorta and  great vessels. No visible coronary calcifications. The central airways are clear. No mediastinal, hilar or axillary lymphadenopathy.  LUNGS AND PLEURA: Mild reticulated scarring at the lung apices. Respiratory motion limits fine detail in the lungs.  There is chronic linear scarring in the left lower lobe.  Mild posterior atelectasis. There is no consolidation, effusion, pneumothorax, or visible nodules.  ABDOMEN AND PELVIS: Respiratory motion limits fine detail in the abdomen.  LIVER: The liver is small and cirrhotic with a recanalized umbilical vein consistent with portal hypertension. No masses seen without contrast.  GALLBLADDER AND BILE DUCTS: The gallbladder is not well evaluated but is grossly unremarkable. No biliary ductal dilatation.  SPLEEN: The spleen is normal in size and attenuation.  PANCREAS:  The pancreas is atrophic with no contour deforming mass or ductal dilatation.  ADRENAL GLANDS: There is no adrenal mass.  KIDNEYS, URETERS AND BLADDER: There is no contour-deforming renal mass. No stones in the kidneys or ureters. No hydronephrosis. No perinephric or periureteral stranding. Urinary bladder is unremarkable.  GI AND BOWEL: The stomach is contracted. The small bowel is normal caliber without inflammation.  The appendix is again noted absent. No findings of acute colitis or diverticulitis.  REPRODUCTIVE ORGANS: The uterus is absent. No adnexal mass.  PERITONEUM AND RETROPERITONEUM: No ascites. No free air.  VASCULATURE: Aorta is normal in caliber. There is aortoiliac calcific plaque without aneurysm.  ABDOMINAL AND PELVIS LYMPH NODES: No lymphadenopathy.  BONES AND SOFT TISSUES: Mild thoracic spine dextroscoliosis .no acute osseous findings in the thorax. Small umbilical fat hernia. There are degenerative changes of the lumbar spine greatest at L4-5 with advanced facet hypertrophy and grade 1 anterolisthesis.  No acute or significant  osseous findings. There are varices in the lower abdominal wall draining to the proximal superficial femoral veins.  IMPRESSION: 1. No acute noncontrast CT findings in the chest, abdomen, or pelvis. Motion limited exam. 2. Cirrhotic liver with recanalized umbilical vein and lower abdominal wall varices consistent with portal hypertension. No splenomegaly or ascites. 3. . Chronic findings.  Electronically signed by: Francis Quam MD 05/28/2024 10:34 PM EST RP Workstation: [id number] DG Chest Portable 1 View CLINICAL DATA:  Unwitnessed fall.  Question pneumonia.  EXAM: PORTABLE CHEST 1 VIEW  COMPARISON:  09/18/2023  FINDINGS: The cardiomediastinal contours are normal. The lungs are clear. Pulmonary vasculature is normal. No consolidation, pleural effusion, or pneumothorax. No acute osseous abnormalities are seen.  IMPRESSION: No acute chest findings.  No evidence of pneumonia.  Electronically Signed   By: Andrea Gasman M.D.   On: 05/28/2024 17:24 CT HEAD WO CONTRAST EXAM: CT HEAD WITHOUT CONTRAST 05/28/2024 04:25:00 PM  TECHNIQUE: CT of the head was performed without the administration of intravenous contrast. Automated exposure control, iterative reconstruction, and/or weight based adjustment of the mA/kV was utilized to reduce the radiation dose to as low as reasonably achievable.  COMPARISON: CT head 02/09/2021  CLINICAL HISTORY: Head trauma, abnormal mental status (Age 30-64y)  FINDINGS:  BRAIN AND VENTRICLES: No acute hemorrhage. No evidence of acute infarct. No hydrocephalus. No extra-axial collection. No mass effect or midline shift.  ORBITS: No acute abnormality.  SINUSES: No acute abnormality.  SOFT TISSUES AND SKULL: No acute soft tissue abnormality. No skull fracture.  IMPRESSION: 1. No acute intracranial abnormality.  Electronically signed by: Gilmore Molt 05/28/2024 04:44 PM EST RP Workstation: [id number]       Prior  Endoscopic Evaluations:    EGD 03/04/2024 for evaluation of dysphagia Normal esophagusm dilated.  Portal hypertensive gastropathy.  Normal duodenal bulb and second portion of duodenum  EGD 03/04/2024 for evaluation of dysphagia Normal esophagusm dilated.  Portal hypertensive gastropathy.  Normal duodenal bulb and second portion of duodenum  EGD January 2024:  -Gastric mucosal changes consistent with portal hypertensive gastropathy -Gastric biopsy showed mild nonspecific reactive gastropathy, gastric oxyntic mucosa with parietal cell hyperplasia as can be seen in hypergastrinemia state such as PPI therapy.  No H. pylori.   Attempted colonoscopy January 2024: -Preparation of the colon was inadequate.  Formed stool balls found in the rectum traveling up to the sigmoid colon.  8 mm polyp seen in the rectosigmoid colon, not removed. -Hemorrhoids found on perianal exam.   Colonoscopy 09/2022: -prep of colon inadequ   Review  of Systems: All systems reviewed and negative except where noted in HPI.  Physical Exam: Vital signs in last 24 hours: Temp:  [97.7 F (36.5 C)-99 F (37.2 C)] 98.3 F (36.8 C) (12/26 0753) Pulse Rate:  [82-123] 94 (12/26 0822) Resp:  [16-31] 19 (12/26 0441) BP: (113-185)/(58-161) 132/76 (12/26 0822) SpO2:  [97 %-100 %] 98 % (12/26 0441) Weight:  [90.7 kg] 90.7 kg (12/25 1500)   General:  Pleasant female in NAD Psych:  Cooperative. Normal mood and affect Eyes: Pupils equal Ears:  Normal auditory acuity Nose: No deformity, discharge or lesions Neck:  Supple, no masses felt Lungs:  Clear to auscultation.  Heart:  Regular rate, regular rhythm.  Abdomen:  Soft, nondistended, nontender, active bowel sounds, no masses felt Rectal :  Deferred Msk: Symmetrical without gross deformities.  Neurologic:  Alert, oriented to person, place, year, and month but does frequently talk about things that seem unrelated /out of context.  Hard to assess for asterixis as her hands  are very tremulous Extremities : 1-2+ bilateral lower extremity edema .  Generalized bruising to right upper thigh Skin:  Intact without significant lesions.   OUTPATIENT MEDICATIONS Prior to Admission medications  Medication Sig Start Date End Date Taking? Authorizing Provider  acetaminophen  (TYLENOL ) 500 MG tablet Take 1,000 mg by mouth every 6 (six) hours as needed for mild pain (pain score 1-3) or headache.   Yes [provider]  amLODipine  (NORVASC ) 10 MG tablet Take 1 tablet (10 mg total) by mouth daily. 04/20/24  Yes Bevely Doffing, FNP  baclofen  (LIORESAL ) 10 MG tablet Take 10 mg by mouth 4 (four) times daily as needed for muscle spasms.   Yes [provider]  diphenoxylate-atropine (LOMOTIL) 2.5-0.025 MG tablet Take 2 tablets by mouth 4 (four) times daily. Patient taking differently: Take 2 tablets by mouth 4 (four) times daily as needed. 07/25/23  Yes [provider]  DULoxetine  (CYMBALTA ) 30 MG capsule Take 1 capsule (30 mg total) by mouth daily. 04/20/24  Yes Bevely Doffing, FNP  DULoxetine  (CYMBALTA ) 60 MG capsule Take 1 capsule (60 mg total) by mouth daily. Patient taking differently: Take 60 mg by mouth at bedtime. 04/20/24  Yes Bevely Doffing, FNP  estradiol  (CLIMARA  - DOSED IN MG/24 HR) 0.05 mg/24hr patch Place 1 patch (0.05 mg total) onto the skin once a week. 04/20/24  Yes Bevely Doffing, FNP  hydrochlorothiazide  (MICROZIDE ) 12.5 MG capsule Take 1 capsule (12.5 mg total) by mouth daily. 04/20/24  Yes Bevely Doffing, FNP  lactulose  (CHRONULAC ) 10 GM/15ML solution Take 20 g by mouth 2 (two) times daily. 03/23/24  Yes [provider]  levothyroxine  (SYNTHROID ) 25 MCG tablet Take 1 tablet (25 mcg total) by mouth daily before breakfast. 04/20/24  Yes Huenink, Doffing, FNP  methocarbamol  (ROBAXIN ) 500 MG tablet Take 500 mg by mouth every 8 (eight) hours as needed for muscle spasms. 03/23/24  Yes [provider]  metoCLOPramide  (REGLAN ) 5 MG  tablet Take 5 mg by mouth every 6 (six) hours as needed for nausea or vomiting. 08/13/22  Yes [provider]  metoprolol  tartrate (LOPRESSOR ) 25 MG tablet Take 1 tablet (25 mg total) by mouth 2 (two) times daily. 04/20/24  Yes Bevely Doffing, FNP  ondansetron  (ZOFRAN -ODT) 4 MG disintegrating tablet Take 4 mg by mouth every 8 (eight) hours as needed for nausea or vomiting.   Yes [provider]  pantoprazole  (PROTONIX ) 40 MG tablet Take 1 tablet (40 mg total) by mouth 2 (two) times daily before a  meal. Patient taking differently: Take 40 mg by mouth 2 (two) times daily as needed (Acid reflux, heartburn). 02/24/24  Yes Ezzard Sonny RAMAN, PA-C  promethazine  (PHENERGAN ) 25 MG tablet Take 25 mg by mouth 4 (four) times daily as needed. 02/11/24  Yes [provider]  venlafaxine  XR (EFFEXOR -XR) 75 MG 24 hr capsule Take 1 capsule (75 mg total) by mouth daily with breakfast. 04/20/24  Yes Bevely Doffing, FNP  zolpidem  (AMBIEN ) 5 MG tablet Take 1 tablet (5 mg total) by mouth at bedtime. Patient not taking: Reported on 05/28/2024 04/20/24   Bevely Doffing, FNP    Allergies as of 05/28/2024 - Review Complete 05/28/2024  Allergen Reaction Noted   Codeine Hives, Nausea And Vomiting, and Other (See Comments) 03/03/2008   Complex [vitamins-lipotropics er] Hives, Itching, and Other (See Comments) 05/28/2024   Nsaids Nausea And Vomiting and Other (See Comments) 11/20/2016   Flexeril  [cyclobenzaprine ] Other (See Comments) 11/21/2022   Latex Hives and Itching 02/23/2011   Lyrica [pregabalin] Hives and Itching 11/20/2016    INPATIENT MEDICATIONS Current Facility-Administered Medications  Medication Dose Route Frequency Provider Last Rate Last Admin   0.9 % NaCl with KCl 40 mEq / L  infusion   Intravenous Continuous Samtani, Jai-Gurmukh, MD 100 mL/hr at 05/29/24 0842 New Bag at 05/29/24 0842   amLODipine  (NORVASC ) tablet 10 mg  10 mg Oral Daily Franky Redia SAILOR, MD   10 mg at 05/29/24  0825   DULoxetine  (CYMBALTA ) DR capsule 60 mg  60 mg Oral Daily Franky Redia SAILOR, MD   60 mg at 05/29/24 9176   lactulose  (CHRONULAC ) 10 GM/15ML solution 30 g  30 g Oral TID Franky Redia SAILOR, MD   30 g at 05/29/24 9175   levothyroxine  (SYNTHROID ) tablet 25 mcg  25 mcg Oral QAC breakfast Franky Redia SAILOR, MD   25 mcg at 05/29/24 9175   metoprolol  tartrate (LOPRESSOR ) tablet 25 mg  25 mg Oral BID Franky Redia SAILOR, MD   25 mg at 05/29/24 9177   venlafaxine  XR (EFFEXOR -XR) 24 hr capsule 75 mg  75 mg Oral Q breakfast Franky Redia SAILOR, MD   75 mg at 05/29/24 9175     Past Medical History:  Diagnosis Date   Anemia    Anxiety    Asthma    Cirrhosis (HCC)    Cirrhosis (HCC)    COPD (chronic obstructive pulmonary disease) (HCC)    Depression    Disc degeneration, lumbar    GERD (gastroesophageal reflux disease)    Hypertension    Hypothyroidism    Leukocytosis    Lumbar disc disease    Portal hypertension (HCC)    Shortness of breath    Thrombocytosis     Past Surgical History:  Procedure Laterality Date   ABDOMINAL HYSTERECTOMY     APPENDECTOMY     BACK SURGERY     BIOPSY  03/25/2014   Procedure: GASTRIC BIOPSIES;  Surgeon: Lamar CHRISTELLA Hollingshead, MD;  Location: [facility];  Service: Endoscopy;;   COLONOSCOPY  02/21/2007   MFM:Jwjo papilla and internal hemorrhoids, diminutive rectal polyp/Left-sided diverticula (sigmoid) pedunculated polyps mid descending   COLONOSCOPY  06/04/2006   Dr. Hollingshead: segmental biopsy negative for microscopic colitis. Three polyps, one tubular adenoma. Surveillance due 2013.    COLONOSCOPY N/A 03/04/2024   Procedure: COLONOSCOPY;  Surgeon: Hollingshead Lamar CHRISTELLA, MD;  Location: [facility];  Service: Endoscopy;  Laterality: N/A;  10:00 am, asa 3   COLONOSCOPY WITH PROPOFOL  N/A 03/25/2014   RMR: Rectal  and colonic polyps removed as described above.  Colonic diverticulosis. CT abnormality likely artifactual in orgin     COLONOSCOPY WITH PROPOFOL  N/A  07/04/2022   Procedure: COLONOSCOPY WITH PROPOFOL ;  Surgeon: Shaaron Lamar HERO, MD;  Location: [facility];  Service: Endoscopy;  Laterality: N/A;  10:15 am   ESOPHAGEAL DILATION N/A 03/04/2024   Procedure: DILATION, ESOPHAGUS;  Surgeon: Shaaron Lamar HERO, MD;  Location: [facility];  Service: Endoscopy;  Laterality: N/A;   ESOPHAGOGASTRODUODENOSCOPY  08/08/2005   WLM:Wnmfjo esophagogastroduodenoscopy.   ESOPHAGOGASTRODUODENOSCOPY  06/04/2009   Dr. Shaaron: normal esophagus, small hiatal hernia, duodenal diverticulum, negative H.pylori and celiac   ESOPHAGOGASTRODUODENOSCOPY N/A 03/04/2024   Procedure: EGD (ESOPHAGOGASTRODUODENOSCOPY);  Surgeon: Shaaron Lamar HERO, MD;  Location: [facility];  Service: Endoscopy;  Laterality: N/A;   ESOPHAGOGASTRODUODENOSCOPY (EGD) WITH PROPOFOL  N/A 03/25/2014   RMR: Probable occult cervical esophageal web- status post dilation as described above. Abnormal gastic mucosa of uncertain significance-status post gasrtric biopsy   ESOPHAGOGASTRODUODENOSCOPY (EGD) WITH PROPOFOL  N/A 07/04/2022   Procedure: ESOPHAGOGASTRODUODENOSCOPY (EGD) WITH PROPOFOL ;  Surgeon: Shaaron Lamar HERO, MD;  Location: [facility];  Service: Endoscopy;  Laterality: N/A;   FLEXIBLE SIGMOIDOSCOPY N/A 09/05/2022   Procedure: FLEXIBLE SIGMOIDOSCOPY;  Surgeon: Shaaron Lamar HERO, MD;  Location: [facility];  Service: Endoscopy;  Laterality: N/A;   LIPOMA EXCISION N/A 11/23/2022   Procedure: Removal of lipoma, L5 paraspinous soft tissue;  Surgeon: Gillie Duncans, MD;  Location: [facility];  Service: Neurosurgery;  Laterality: N/A;  RM 21 3C   MALONEY DILATION N/A 03/25/2014   Procedure: MALONEY ESOPHAGEAL DILATION #54 Fr;  Surgeon: Lamar HERO Shaaron, MD;  Location: [facility];  Service: Endoscopy;  Laterality: N/A;   POLYPECTOMY  03/25/2014   Procedure: SIGMOID AND RECTAL POLYPECTOMY;  Surgeon: Lamar HERO Shaaron, MD;  Location: [facility];  Service: Endoscopy;;   right knee surgery     rt breast biopsy     TOTAL  ABDOMINAL HYSTERECTOMY W/ BILATERAL SALPINGOOPHORECTOMY     age 71    Family History  Adopted: Yes  Problem Relation Age of Onset   Pancreatic cancer Mother    Leukemia Sister        1/2 sister   Hepatitis C Brother    Liver cancer Brother        suicide   Colon cancer Brother        75   Cirrhosis Brother        etoh use   Pancreatic cancer Paternal Grandmother    Kidney cancer Paternal Grandfather    Pancreatic cancer Maternal Aunt    Kidney cancer Maternal Uncle    Pancreatic cancer Maternal Uncle    Colon cancer Paternal Aunt    Breast cancer Neg Hx     Social History   Socioeconomic History   Marital status: Single    Spouse name: Not on file   Number of children: Not on file   Years of education: Not on file   Highest education level: GED or equivalent  Occupational History   Not on file  Tobacco Use   Smoking status: Every Day    Current packs/day: 0.50    Average packs/day: 0.5 packs/day for 30.0 years (15.0 ttl pk-yrs)    Types: Cigarettes   Smokeless tobacco: Never  Vaping Use   Vaping status: Never Used  Substance and Sexual Activity   Alcohol use: Never   Drug use: No   Sexual activity: Not Currently  Other Topics Concern  Not on file  Social History Narrative   Not on file   Social Drivers of Health   Tobacco Use: High Risk (05/28/2024)   Patient History    Smoking Tobacco Use: Every Day    Smokeless Tobacco Use: Never    Passive Exposure: Not on file  Financial Resource Strain: High Risk (05/26/2024)   Overall Financial Resource Strain (CARDIA)    Difficulty of Paying Living Expenses: Hard  Food Insecurity: Food Insecurity Present (05/28/2024)   Epic    Worried About Programme Researcher, Broadcasting/film/video in the Last Year: Sometimes true    Ran Out of Food in the Last Year: Never true  Transportation Needs: No Transportation Needs (05/28/2024)   Epic    Lack of Transportation (Medical): No    Lack of Transportation (Non-Medical): No  Physical  Activity: Inactive (05/26/2024)   Exercise Vital Sign    Days of Exercise per Week: 0 days    Minutes of Exercise per Session: Not on file  Stress: Stress Concern Present (05/26/2024)   [employer] of Occupational Health - Occupational Stress Questionnaire    Feeling of Stress: Very much  Social Connections: Socially Isolated (05/26/2024)   Social Connection and Isolation Panel    Frequency of Communication with Friends and Family: More than three times a week    Frequency of Social Gatherings with Friends and Family: Never    Attends Religious Services: Never    Database Administrator or Organizations: No    Attends Engineer, Structural: Not on file    Marital Status: Never married  Intimate Partner Violence: Not At Risk (05/28/2024)   Epic    Fear of Current or Ex-Partner: No    Emotionally Abused: No    Physically Abused: No    Sexually Abused: No  Depression (PHQ2-9): High Risk (04/20/2024)   Depression (PHQ2-9)    PHQ-2 Score: 17  Alcohol Screen: Not on file  Housing: Low Risk (05/28/2024)   Epic    Unable to Pay for Housing in the Last Year: No    Number of Times Moved in the Last Year: 0    Homeless in the Last Year: No  Utilities: Not At Risk (05/28/2024)   Epic    Threatened with loss of utilities: No  Health Literacy: Not on file    Code Status   Code Status: Full Code   Vina Dasen, NP-C   05/29/2024, 12:10 PM     "

## 2024-05-29 NOTE — Progress Notes (Deleted)
 [facility]  TAHJ LINDSETH FMW:[id number]  DOB: 1961/03/23  DOA: 05/28/2024  PCP: Bevely Doffing, FNP  05/29/2024,2:47 PM  LOS: 1 day    Code Status: Full code     from: Home   63 year old white female HTN hypothyroid bipolar chronic pain tobacco abuse Cirrhosis with ascites with prior paracentesis in the past-first diagnosed around 05/2017 and was followed by Dr. Bill--- last MELD score 05/08/2024 in the outpatient setting when followed with Dr. Ivonne office was 11 She has had equivocal testing with previously negative ANA then followed by positive ANA and an intermittently elevated ASMA Previous admission for hypertensive crisis intractable nausea probably from migraine versus gastroenteritis  Apparently family member died and she abruptly stopped taking her lactulose  a week ago in addition to not getting BZD in the past for anxiety In that setting patient came in with some confusion altered mental status covered in bruises tachycardic with some asterixis   CT head no acute intracranial abnormality, CXR no acute findings of pneumonia   Assessment  & Plan :    Decompensated cirrhosis--- has autoimmune liver disease previously referred to [facility] but not yet established Hepatic encephalopathy MELD score on arrival 25 ?  AKI from poor p.o.-is hypertensive-unlikely hepatorenal syndrome- Treat with lactulose  as she had been off of it for several days and see if mentation clears she seems more anxious then overly confused----I do not think it is delirium On exam no shifting dullness-would not remove fluid even if she had ascites given elevated creatinine I do not think she has acute component of liver failure as bilirubin is only slightly elevated-we will see how the next labs turn up Lactulose  increased from 20 twice daily to 30 3 times daily-monitor trends Appreciate GI care.  Chronic pain/depression Recent stressor of loss of family member At home is on Effexor  75 Robaxin  500 every 8 as needed  Cymbalta  90 daily and baclofen  10 4 times daily Unclear if she was taking more than she was supposed to?--- Daughter found several bottles strewn around the patient's home but they were not empty and it did not seem like she had any plan for SI given the recent events as above Patient was prescribed Xanax  11/6 and her PCP Dr. Bertell changed and she was left without a place to refill over the past 2 weeks I will start very sparing Xanax  0.25 twice daily  AKI with metabolic acidosis and elevated anion gap 24 Hypokalemia PTA on HCTZ which can cause both AKI and could have precipitated hypokalemia--- has not been eating as per below We will hold going forward-would continue NS with 40 of K to replace electrolytes--- magnesium  is okay at 2.0 Lactic acidosis unlikely to be infectious-probably from AKI  Data Reviewed today: Sodium 146 potassium 3.2 bicarb 21 BUN/creatinine 43/3.4 alk phos 285 AST 133 ALT 48 total bili 1.3 WBC 12.9 hemoglobin 11.0 platelet 291 INR 1.3 Tylenol  level less than 10 hepatitis panel negative HIV negative ethanol level negative   DVT prophylaxis: SCD   Dispo/Global plan: Likely home when stabilized     Subjective:   Daughter adds to h/o--patient was driving till a few weeks ago, normally is lucid/perfoms ADL and IADL Patient stopped eating on Sunday 12/21 as the patient's sister had stayed with her for 48 hours after daughter and patient ate a meal on Saturday evening and the sister did not notice that she had eaten anything Patient is hyperverbal slightly agitated and a little bit delirious and has some delusions of  reference to nursing trying to give her meds She is able to tell me the month year    Objective + exam Vitals:   05/28/24 2216 05/29/24 0441 05/29/24 0753 05/29/24 0822  BP: (!) 148/81 113/67 132/76 132/76  Pulse: (!) 120 82 94 94  Resp: 20 19    Temp: 99 F (37.2 C) 98.6 F (37 C) 98.3 F (36.8 C)   TempSrc:   Oral   SpO2: 100% 98%     Weight:      Height:       Filed Weights   05/28/24 1500  Weight: 90.7 kg     Examination: EOMI NCAT no focal deficit a little bit hyperverbal slightly confused but when asked orienting question gets most of them right-moving 4 limbs equally somewhat hyperkinetic/agitated-no asterixis S1-S2 no murmur no rub no gallop Power 5/5 ROM intact Mild asterixis depending Moderate dentition  Scheduled Meds:  ALPRAZolam   0.25 mg Oral BID   amLODipine   10 mg Oral Daily   DULoxetine   60 mg Oral Daily   lactulose   30 g Oral TID   levothyroxine   25 mcg Oral QAC breakfast   metoprolol  tartrate  25 mg Oral BID   venlafaxine  XR  75 mg Oral Q breakfast   Continuous Infusions:  0.9 % NaCl with KCl 40 mEq / L 100 mL/hr at 05/29/24 9157     Time 70  Colen Grimes, MD  [facility]

## 2024-05-30 ENCOUNTER — Inpatient Hospital Stay (HOSPITAL_COMMUNITY)

## 2024-05-30 DIAGNOSIS — N179 Acute kidney failure, unspecified: Secondary | ICD-10-CM | POA: Diagnosis not present

## 2024-05-30 DIAGNOSIS — R509 Fever, unspecified: Secondary | ICD-10-CM

## 2024-05-30 DIAGNOSIS — K7682 Hepatic encephalopathy: Secondary | ICD-10-CM | POA: Diagnosis not present

## 2024-05-30 DIAGNOSIS — E871 Hypo-osmolality and hyponatremia: Secondary | ICD-10-CM

## 2024-05-30 DIAGNOSIS — K7290 Hepatic failure, unspecified without coma: Secondary | ICD-10-CM | POA: Diagnosis not present

## 2024-05-30 LAB — COMPREHENSIVE METABOLIC PANEL WITH GFR
ALT: 55 U/L — ABNORMAL HIGH (ref 0–44)
AST: 148 U/L — ABNORMAL HIGH (ref 15–41)
Albumin: 2.9 g/dL — ABNORMAL LOW (ref 3.5–5.0)
Alkaline Phosphatase: 292 U/L — ABNORMAL HIGH (ref 38–126)
Anion gap: 15 (ref 5–15)
BUN: 37 mg/dL — ABNORMAL HIGH (ref 8–23)
CO2: 14 mmol/L — ABNORMAL LOW (ref 22–32)
Calcium: 8.7 mg/dL — ABNORMAL LOW (ref 8.9–10.3)
Chloride: 111 mmol/L (ref 98–111)
Creatinine, Ser: 2.1 mg/dL — ABNORMAL HIGH (ref 0.44–1.00)
GFR, Estimated: 26 mL/min — ABNORMAL LOW
Glucose, Bld: 92 mg/dL (ref 70–99)
Potassium: 3.8 mmol/L (ref 3.5–5.1)
Sodium: 140 mmol/L (ref 135–145)
Total Bilirubin: 1.5 mg/dL — ABNORMAL HIGH (ref 0.0–1.2)
Total Protein: 6.5 g/dL (ref 6.5–8.1)

## 2024-05-30 LAB — RESPIRATORY PANEL BY PCR

## 2024-05-30 LAB — CBC WITH DIFFERENTIAL/PLATELET
Abs Immature Granulocytes: 0.09 K/uL — ABNORMAL HIGH (ref 0.00–0.07)
Basophils Absolute: 0.1 K/uL (ref 0.0–0.1)
Basophils Relative: 0 %
Eosinophils Absolute: 0.2 K/uL (ref 0.0–0.5)
Eosinophils Relative: 2 %
HCT: 35.9 % — ABNORMAL LOW (ref 36.0–46.0)
Hemoglobin: 12.5 g/dL (ref 12.0–15.0)
Immature Granulocytes: 1 %
Lymphocytes Relative: 4 %
Lymphs Abs: 0.6 K/uL — ABNORMAL LOW (ref 0.7–4.0)
MCH: 29.6 pg (ref 26.0–34.0)
MCHC: 34.8 g/dL (ref 30.0–36.0)
MCV: 84.9 fL (ref 80.0–100.0)
Monocytes Absolute: 1.1 K/uL — ABNORMAL HIGH (ref 0.1–1.0)
Monocytes Relative: 8 %
Neutro Abs: 12.2 K/uL — ABNORMAL HIGH (ref 1.7–7.7)
Neutrophils Relative %: 85 %
Platelets: 279 K/uL (ref 150–400)
RBC: 4.23 MIL/uL (ref 3.87–5.11)
RDW: 15.6 % — ABNORMAL HIGH (ref 11.5–15.5)
WBC: 14.2 K/uL — ABNORMAL HIGH (ref 4.0–10.5)
nRBC: 0.1 % (ref 0.0–0.2)

## 2024-05-30 LAB — BASIC METABOLIC PANEL WITH GFR
Anion gap: 16 — ABNORMAL HIGH (ref 5–15)
BUN: 38 mg/dL — ABNORMAL HIGH (ref 8–23)
CO2: 19 mmol/L — ABNORMAL LOW (ref 22–32)
Calcium: 9.1 mg/dL (ref 8.9–10.3)
Chloride: 115 mmol/L — ABNORMAL HIGH (ref 98–111)
Creatinine, Ser: 2.46 mg/dL — ABNORMAL HIGH (ref 0.44–1.00)
GFR, Estimated: 22 mL/min — ABNORMAL LOW
Glucose, Bld: 127 mg/dL — ABNORMAL HIGH (ref 70–99)
Potassium: 2.7 mmol/L — CL (ref 3.5–5.1)
Sodium: 150 mmol/L — ABNORMAL HIGH (ref 135–145)

## 2024-05-30 LAB — POTASSIUM: Potassium: 3.3 mmol/L — ABNORMAL LOW (ref 3.5–5.1)

## 2024-05-30 MED ORDER — POTASSIUM CHLORIDE 2 MEQ/ML IV SOLN
INTRAVENOUS | Status: DC
  Filled 2024-05-30 (×3): qty 1000

## 2024-05-30 MED ORDER — POTASSIUM CHLORIDE 10 MEQ/100ML IV SOLN
10.0000 meq | INTRAVENOUS | Status: AC
  Administered 2024-05-30 (×4): 10 meq via INTRAVENOUS
  Filled 2024-05-30 (×4): qty 100

## 2024-05-30 MED ORDER — OSELTAMIVIR PHOSPHATE 75 MG PO CAPS
75.0000 mg | ORAL_CAPSULE | Freq: Once | ORAL | Status: AC
  Administered 2024-05-30: 75 mg via ORAL
  Filled 2024-05-30: qty 1

## 2024-05-30 MED ORDER — LACTULOSE 10 GM/15ML PO SOLN
30.0000 g | Freq: Two times a day (BID) | ORAL | Status: DC
  Administered 2024-05-30 – 2024-05-31 (×2): 30 g via ORAL
  Filled 2024-05-30 (×2): qty 45

## 2024-05-30 MED ORDER — OSELTAMIVIR PHOSPHATE 30 MG PO CAPS
30.0000 mg | ORAL_CAPSULE | Freq: Two times a day (BID) | ORAL | Status: DC
  Administered 2024-05-31: 30 mg via ORAL
  Filled 2024-05-30 (×2): qty 1

## 2024-05-30 MED ORDER — POTASSIUM CHLORIDE CRYS ER 20 MEQ PO TBCR
40.0000 meq | EXTENDED_RELEASE_TABLET | Freq: Once | ORAL | Status: AC
  Administered 2024-05-30: 40 meq via ORAL
  Filled 2024-05-30: qty 2

## 2024-05-30 MED ORDER — OSELTAMIVIR PHOSPHATE 75 MG PO CAPS: 75.0000 mg | ORAL_CAPSULE | Freq: Two times a day (BID) | ORAL | Status: DC

## 2024-05-30 MED ORDER — SODIUM BICARBONATE 650 MG PO TABS
1300.0000 mg | ORAL_TABLET | Freq: Two times a day (BID) | ORAL | Status: DC
  Administered 2024-05-30 – 2024-05-31 (×3): 1300 mg via ORAL
  Filled 2024-05-30 (×3): qty 2

## 2024-05-30 MED ORDER — ACETAMINOPHEN 325 MG PO TABS
650.0000 mg | ORAL_TABLET | Freq: Once | ORAL | Status: AC
  Administered 2024-05-30: 650 mg via ORAL
  Filled 2024-05-30: qty 2

## 2024-05-30 NOTE — Progress Notes (Deleted)
 [facility]  [name] [id number]  DOB: Jun 19, 1960  DOA: 05/28/2024  PCP: Bevely Doffing, FNP  05/29/2024,2:47 PM  LOS: 1 day    Code Status: Full code     from: Home     63 year old white female HTN hypothyroid bipolar chronic pain tobacco abuse Cirrhosis with ascites with prior paracentesis in the past-first diagnosed around 05/2017 and was followed by Dr. Bill--- last MELD score 05/08/2024 in the outpatient setting when followed with Dr. Ivonne office was 11 She has had equivocal testing with previously negative ANA then followed by positive ANA and an intermittently elevated ASMA Previous admission for hypertensive crisis intractable nausea probably from migraine versus gastroenteritis   Apparently family member died and she abruptly stopped taking her lactulose  a week ago in addition to not getting BZD in the past for anxiety In that setting patient came in with some confusion altered mental status covered in bruises tachycardic with some asterixis    CT head no acute intracranial abnormality, CXR no acute findings of pneumonia    Assessment  & Plan :      Decompensated cirrhosis--- has autoimmune liver disease previously referred to [facility] but not yet established Hepatic encephalopathy MELD score on arrival 25 ?  AKI from poor p.o.-is hypertensive-unlikely hepatorenal syndrome- Mentation clearing-cut back dose to 30 twice daily lactulose  unclear if can afford rifaximin See below   Chronic pain/depression Recent stressor of loss of family member At home is on Effexor  75 Robaxin  500 every 8 as needed Cymbalta  90 daily and baclofen  10 4 times daily Patient was prescribed Xanax  11/6 and her PCP Dr. Bertell changed and she was left without a place to refill over the past 2 weeks--- cautious dosingXanax 0.25 twice daily   AKI with metabolic acidosis and elevated anion gap 24 Hypokalemia PTA on HCTZ which can cause both AKI and could have precipitated hypokalemia--- has not been  eating as per below Fluids adjusted to D5 with 40 of K@100  cc/H-to treat the metabolic acidosis would add bicarb 1.3 g twice daily   Data Reviewed today: Sodium 150 potassium 2.7 bicarb 19 chloride 115 BUN/creatinine 38/2.4 WBC 14.2 platelet 279 hemoglobin 12.5   DVT prophylaxis: SCD     Dispo/Global plan: Likely home when stabilized               Subjective:    Awake coherent no distress much more with it can tell me date time year person president and former president No real flap     Objective + exam BP (!) 131/56 (BP Location: Right Arm)   Pulse 80   Temp 98.4 F (36.9 C) (Oral)   Resp 20   Ht 5' 6 (1.676 m)   Wt 90.7 kg   SpO2 95%   BMI 32.28 kg/m  EOMI NCAT no focal deficit no icterus no pallor no wheeze no rales no rhonchi CTAB Abdomen soft no rebound distended slightly Power 5/5   Scheduled Meds:  ALPRAZolam   0.25 mg Oral BID   amLODipine   10 mg Oral Daily   DULoxetine   60 mg Oral Daily   lactulose   30 g Oral TID   levothyroxine   25 mcg Oral QAC breakfast   metoprolol  tartrate  25 mg Oral BID   sodium bicarbonate   1,300 mg Oral BID   venlafaxine  XR  75 mg Oral Q breakfast   Continuous Infusions:  dextrose  5 % 1,000 mL with potassium chloride  40 mEq infusion 100 mL/hr at 05/30/24 0849   PRN Meds:.metoCLOPramide   Time 24   Colen Grimes, MD  [facility]

## 2024-05-30 NOTE — Progress Notes (Deleted)
 [facility]  [name] [id number]  DOB: 12/24/1960  DOA: 05/28/2024  PCP: Bevely Doffing, FNP  05/30/2024,10:17 AM  LOS: 2 days    Code Status: Full code     from: Home   63 year old white female HTN hypothyroid bipolar chronic pain tobacco abuse Cirrhosis with ascites with prior paracentesis in the past-first diagnosed around 05/2017 and was followed by Dr. Bill--- last MELD score 05/08/2024 in the outpatient setting when followed with Dr. Ivonne office was 11 She has had equivocal testing with previously negative ANA then followed by positive ANA and an intermittently elevated ASMA Previous admission for hypertensive crisis intractable nausea probably from migraine versus gastroenteritis  Apparently family member died and she abruptly stopped taking her lactulose  a week ago in addition to not getting BZD in the past for anxiety In that setting patient came in with some confusion altered mental status covered in bruises tachycardic with some asterixis   CT head no acute intracranial abnormality, CXR no acute findings of pneumonia   Assessment  & Plan :    Decompensated cirrhosis---autoimmune liver disease previously [facility]  Hepatic encephalopathy MELD score on arrival 25 ?  AKI from poor p.o.-is hypertensive-unlikely hepatorenal syndrome Treat with lactulose  30 tid--cut back to bid given improvements? Appreciate GI care  One-time fever 103 Tylenol  given-blood cultures not obtained at time of fever Obtain blood culture X2-not really sure if she has pneumonia-get CXR X1, Hoarse and sore throat-COVID testing on admission negative-expanded RVP ordered  Chronic pain/depression Recent stressor of loss of family member on Effexor  75 Robaxin  500 every 8 as needed Cymbalta  90 daily and baclofen  10 4 times daily Unlikely overmedicated on admit Patient was prescribed Xanax  11/6 ---her PCP Dr. Bertell changed and she was left without a place to refill over the past 2 weeks sparing Xanax  0.25  twice daily  AKI with metabolic acidosis and elevated anion gap 24 Hypokalemia Hypovolemic hyponatremia -Urine sodium less than 30 urine creatinine less than 10 indicative likely volume depletion PTA on HCTZ which can cause both AKI and could have precipitated hypokalemia- Potassium overcorrected secondary to iatrogenic fluids Currently D5 +40 K@100  cc/H-+2 L so far-likely slow down rate in the next 12 hours once potassium/sodium correction occurs  Data Reviewed today: Sodium 146 potassium 3.2 bicarb 21 BUN/creatinine 43/3.4 alk phos 285 AST 133 ALT 48 total bili 1.3 WBC 12.9 hemoglobin 11.0 platelet 291 INR 1.3 Tylenol  level less than 10 hepatitis panel negative HIV negative ethanol level negative   DVT prophylaxis: SCD   Dispo/Global plan: Likely home when stabilized     Subjective:   More awake coherent pleasant no distress looks fair-coughing a little bit voice is still hoarse Cannot tell me date time year president as well as former president No other real complaints   Objective + exam Vitals:   05/29/24 2115 05/30/24 0448 05/30/24 0522 05/30/24 0827  BP: (!) 156/69 (!) 153/75  (!) 131/56  Pulse: (!) 103 99  80  Resp: 20 20  20   Temp: 98.6 F (37 C) (!) 102.9 F (39.4 C) 98.5 F (36.9 C) 98.4 F (36.9 C)  TempSrc: Oral Oral Oral Oral  SpO2: 100% 100%  95%  Weight:      Height:       Filed Weights   05/28/24 1500  Weight: 90.7 kg     Examination: EOMI NCAT no focal deficit a little bit hyperverbal slightly confused but when asked orienting question gets most of them right-moving 4 limbs equally somewhat hyperkinetic/agitated-no  asterixis S1-S2 no murmur no rub no gallop Power 5/5 ROM intact Mild asterixis depending Moderate dentition  Scheduled Meds:  ALPRAZolam   0.25 mg Oral BID   amLODipine   10 mg Oral Daily   DULoxetine   60 mg Oral Daily   lactulose   30 g Oral BID   levothyroxine   25 mcg Oral QAC breakfast   metoprolol  tartrate  25 mg Oral BID    sodium bicarbonate   1,300 mg Oral BID   venlafaxine  XR  75 mg Oral Q breakfast   Continuous Infusions:  dextrose  5 % 1,000 mL with potassium chloride  40 mEq infusion 100 mL/hr at 05/30/24 9150   metoCLOPramide   Time 40  Tammy Jamarious Febo, MD  [facility]

## 2024-05-30 NOTE — Progress Notes (Signed)
 Note from 12/26  [name] FMW:[id number]  DOB: [birth year]  DOA: 05/28/2024  PCP: Bevely Doffing, FNP  05/29/2024,2:47 PM  LOS: 1 day    Code Status: Full code     from: Home     63 year old white female HTN hypothyroid bipolar chronic pain tobacco abuse Cirrhosis with ascites with prior paracentesis in the past-first diagnosed around 05/2017 and was followed by Dr. Bill--- last MELD score 05/08/2024 in the outpatient setting when followed with Dr. Ivonne office was 11 She has had equivocal testing with previously negative ANA then followed by positive ANA and an intermittently elevated ASMA Previous admission for hypertensive crisis intractable nausea probably from migraine versus gastroenteritis   Apparently family member died and she abruptly stopped taking her lactulose  a week ago in addition to not getting BZD in the past for anxiety In that setting patient came in with some confusion altered mental status covered in bruises tachycardic with some asterixis    CT head no acute intracranial abnormality, CXR no acute findings of pneumonia    Assessment  & Plan :      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  AKI from poor p.o.-is hypertensive-unlikely hepatorenal syndrome- Treat with lactulose  as she had been off of it for several days and see if mentation clears she seems more anxious then overly confused----I do not think it is delirium On exam no shifting dullness-would not remove fluid even if she had ascites given elevated creatinine I do not think she has acute component of liver failure as bilirubin is only slightly elevated-we will see how the next labs turn up Lactulose  increased from 20 twice daily to 30 3 times daily-monitor trends Appreciate GI care.   Chronic pain/depression Recent stressor of loss of family member At home is on Effexor  75 Robaxin   500 every 8 as needed Cymbalta  90 daily and baclofen  10 4 times daily Unclear if she was taking more than she was supposed to?--- Daughter found several bottles strewn around the patient's home but they were not empty and it did not seem like she had any plan for SI given the recent events as above Patient was prescribed Xanax  11/6 and her PCP Dr. Bertell changed and she was left without a place to refill over the past 2 weeks I will start very sparing Xanax  0.25 twice daily   AKI with metabolic acidosis and elevated anion gap 24 Hypokalemia PTA on HCTZ which can cause both AKI and could have precipitated hypokalemia--- has not been eating as per below We will hold going forward-would continue NS with 40 of K to replace electrolytes--- magnesium  is okay at 2.0 Lactic acidosis unlikely to be infectious-probably from AKI   Data Reviewed today: Sodium 146 potassium 3.2 bicarb 21 BUN/creatinine 43/3.4 alk phos 285 AST 133 ALT 48 total bili 1.3 WBC 12.9 hemoglobin 11.0 platelet 291 INR 1.3 Tylenol  level less than 10 hepatitis panel negative HIV negative ethanol level negative     DVT prophylaxis: SCD     Dispo/Global plan: Likely home when stabilized               Subjective:         Objective + exam BP (!) 131/56 (BP Location: Right Arm)   Pulse 80   Temp 98.4 F (36.9 C) (Oral)   Resp 20   Ht 5' 6 (1.676 m)   Wt 90.7 kg  SpO2 95%   BMI 32.28 kg/m         Examination: EOMI NCAT no focal deficit a little bit hyperverbal slightly confused but when asked orienting question gets most of them right-moving 4 limbs equally somewhat hyperkinetic/agitated-no asterixis S1-S2 no murmur no rub no gallop Power 5/5 ROM intact Mild asterixis depending Moderate dentition   Scheduled Meds: Scheduled Meds:  ALPRAZolam   0.25 mg Oral BID   amLODipine   10 mg Oral Daily   DULoxetine   60 mg Oral Daily   lactulose   30 g Oral TID   levothyroxine   25 mcg Oral QAC breakfast   metoprolol   tartrate  25 mg Oral BID   sodium bicarbonate   1,300 mg Oral BID   venlafaxine  XR  75 mg Oral Q breakfast   Continuous Infusions:  dextrose  5 % 1,000 mL with potassium chloride  40 mEq infusion 100 mL/hr at 05/30/24 0849   PRN Meds:.metoCLOPramide       Time 70   Jai-Gurmukh Jazier Mcglamery, MD  [facility]

## 2024-05-30 NOTE — Progress Notes (Signed)
 [facility]  [name] [id number]  DOB: 1960/11/02  DOA: 05/28/2024  PCP: Bevely Doffing, FNP  05/30/2024,5:26 PM  LOS: 2 days    Code Status: Full code     from: Home   63 year old white female HTN hypothyroid bipolar chronic pain tobacco abuse Cirrhosis with ascites with prior paracentesis in the past-first diagnosed around 05/2017 and was followed by Dr. Bill--- last MELD score 05/08/2024 in the outpatient setting when followed with Dr. Ivonne office was 11 She has had equivocal testing with previously negative ANA then followed by positive ANA and an intermittently elevated ASMA Previous admission for hypertensive crisis intractable nausea probably from migraine versus gastroenteritis  Apparently family member died and she abruptly stopped taking her lactulose  a week ago in addition to not getting BZD in the past for anxiety In that setting patient came in with some confusion altered mental status covered in bruises tachycardic with some asterixis   CT head no acute intracranial abnormality, CXR no acute findings of pneumonia   Assessment  & Plan :    Decompensated cirrhosis---autoimmune liver disease previously [facility]  Hepatic encephalopathy MELD score on arrival 25 ?  AKI from poor p.o.-is hypertensive-unlikely hepatorenal syndrome Treat with lactulose  30 tid--cut back to bid given improvements? Appreciate GI care  One-time fever 103 Tylenol  given-blood cultures not obtained at time of fever Obtain blood culture X2-not really sure if she has pneumonia-get CXR X1, Hoarse and sore throat-COVID testing on admission negative-expanded RVP ordered  Chronic pain/depression Recent stressor of loss of family member on Effexor  75 Robaxin  500 every 8 as needed Cymbalta  90 daily and baclofen  10 4 times daily Unlikely overmedicated on admit Patient was prescribed Xanax  11/6 ---her PCP Dr. Bertell changed and she was left without a place to refill over the past 2 weeks sparing Xanax  0.25  twice daily  AKI with metabolic acidosis and elevated anion gap 24 Hypokalemia Hypovolemic hyponatremia -Urine sodium less than 30 urine creatinine less than 10 indicative likely volume depletion PTA on HCTZ which can cause both AKI and could have precipitated hypokalemia- Potassium overcorrected secondary to iatrogenic fluids Currently D5 +40 K@100  cc/H-+2 L so far-likely slow down rate in the next 12 hours once potassium/sodium correction occurs  Data Reviewed today: Sodium 146 potassium 3.2 bicarb 21 BUN/creatinine 43/3.4 alk phos 285 AST 133 ALT 48 total bili 1.3 WBC 12.9 hemoglobin 11.0 platelet 291 INR 1.3 Tylenol  level less than 10 hepatitis panel negative HIV negative ethanol level negative   DVT prophylaxis: SCD   Dispo/Global plan: Likely home when stabilized     Subjective:   More awake coherent pleasant no distress looks fair-coughing a little bit voice is still hoarse Cannot tell me date time year president as well as former president No other real complaints   Objective + exam Vitals:   05/29/24 2115 05/30/24 0448 05/30/24 0522 05/30/24 0827  BP: (!) 156/69 (!) 153/75  (!) 131/56  Pulse: (!) 103 99  80  Resp: 20 20  20   Temp: 98.6 F (37 C) (!) 102.9 F (39.4 C) 98.5 F (36.9 C) 98.4 F (36.9 C)  TempSrc: Oral Oral Oral Oral  SpO2: 100% 100%  95%  Weight:      Height:       Filed Weights   05/28/24 1500  Weight: 90.7 kg     Examination: EOMI NCAT no focal deficit a little bit hyperverbal slightly confused but when asked orienting question gets most of them right-moving 4 limbs equally somewhat hyperkinetic/agitated-no  asterixis S1-S2 no murmur no rub no gallop Power 5/5 ROM intact Mild asterixis depending Moderate dentition  Scheduled Meds:  ALPRAZolam   0.25 mg Oral BID   amLODipine   10 mg Oral Daily   DULoxetine   60 mg Oral Daily   lactulose   30 g Oral BID   levothyroxine   25 mcg Oral QAC breakfast   metoprolol  tartrate  25 mg Oral BID    sodium bicarbonate   1,300 mg Oral BID   venlafaxine  XR  75 mg Oral Q breakfast   Continuous Infusions:  dextrose  5 % 1,000 mL with potassium chloride  40 mEq infusion 100 mL/hr at 05/30/24 0849   metoCLOPramide   Time 40  Jai-Gurmukh Avanna Sowder, MD  [facility]

## 2024-05-30 NOTE — Progress Notes (Signed)
 Addend  FLU A is +--CXRis neg Start Tamiflu  75 bid--airborne precutions

## 2024-05-30 NOTE — Progress Notes (Deleted)
 Note from 12/26  [name] FMW:[id number]  DOB: [birth year]  DOA: 05/28/2024  PCP: Bevely Doffing, FNP  05/29/2024,2:47 PM  LOS: 1 day    Code Status: Full code     from: Home     63 year old white female HTN hypothyroid bipolar chronic pain tobacco abuse Cirrhosis with ascites with prior paracentesis in the past-first diagnosed around 05/2017 and was followed by Dr. Bill--- last MELD score 05/08/2024 in the outpatient setting when followed with Dr. Ivonne office was 11 She has had equivocal testing with previously negative ANA then followed by positive ANA and an intermittently elevated ASMA Previous admission for hypertensive crisis intractable nausea probably from migraine versus gastroenteritis   Apparently family member died and she abruptly stopped taking her lactulose  a week ago in addition to not getting BZD in the past for anxiety In that setting patient came in with some confusion altered mental status covered in bruises tachycardic with some asterixis    CT head no acute intracranial abnormality, CXR no acute findings of pneumonia    Assessment  & Plan :      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  AKI from poor p.o.-is hypertensive-unlikely hepatorenal syndrome- Treat with lactulose  as she had been off of it for several days and see if mentation clears she seems more anxious then overly confused----I do not think it is delirium On exam no shifting dullness-would not remove fluid even if she had ascites given elevated creatinine I do not think she has acute component of liver failure as bilirubin is only slightly elevated-we will see how the next labs turn up Lactulose  increased from 20 twice daily to 30 3 times daily-monitor trends Appreciate GI care.   Chronic pain/depression Recent stressor of loss of family member At home is on Effexor  75 Robaxin   500 every 8 as needed Cymbalta  90 daily and baclofen  10 4 times daily Unclear if she was taking more than she was supposed to?--- Daughter found several bottles strewn around the patient's home but they were not empty and it did not seem like she had any plan for SI given the recent events as above Patient was prescribed Xanax  11/6 and her PCP Dr. Bertell changed and she was left without a place to refill over the past 2 weeks I will start very sparing Xanax  0.25 twice daily   AKI with metabolic acidosis and elevated anion gap 24 Hypokalemia PTA on HCTZ which can cause both AKI and could have precipitated hypokalemia--- has not been eating as per below We will hold going forward-would continue NS with 40 of K to replace electrolytes--- magnesium  is okay at 2.0 Lactic acidosis unlikely to be infectious-probably from AKI   Data Reviewed today: Sodium 146 potassium 3.2 bicarb 21 BUN/creatinine 43/3.4 alk phos 285 AST 133 ALT 48 total bili 1.3 WBC 12.9 hemoglobin 11.0 platelet 291 INR 1.3 Tylenol  level less than 10 hepatitis panel negative HIV negative ethanol level negative     DVT prophylaxis: SCD     Dispo/Global plan: Likely home when stabilized               Subjective:         Objective + exam BP (!) 131/56 (BP Location: Right Arm)   Pulse 80   Temp 98.4 F (36.9 C) (Oral)   Resp 20   Ht 5' 6 (1.676 m)   Wt 90.7 kg  SpO2 95%   BMI 32.28 kg/m         Examination: EOMI NCAT no focal deficit a little bit hyperverbal slightly confused but when asked orienting question gets most of them right-moving 4 limbs equally somewhat hyperkinetic/agitated-no asterixis S1-S2 no murmur no rub no gallop Power 5/5 ROM intact Mild asterixis depending Moderate dentition   Scheduled Meds: Scheduled Meds:  ALPRAZolam   0.25 mg Oral BID   amLODipine   10 mg Oral Daily   DULoxetine   60 mg Oral Daily   lactulose   30 g Oral TID   levothyroxine   25 mcg Oral QAC breakfast   metoprolol   tartrate  25 mg Oral BID   sodium bicarbonate   1,300 mg Oral BID   venlafaxine  XR  75 mg Oral Q breakfast   Continuous Infusions:  dextrose  5 % 1,000 mL with potassium chloride  40 mEq infusion 100 mL/hr at 05/30/24 0849   PRN Meds:.metoCLOPramide       Time 70   Jai-Gurmukh Jazier Mcglamery, MD  [facility]

## 2024-05-30 NOTE — Plan of Care (Signed)

## 2024-05-30 NOTE — Progress Notes (Signed)
 "    Daily Progress Note  DOA: 05/28/2024 Hospital Day: 3  Cc: Cirrhosis, altered mental status  ASSESSMENT    63 year old female with history of decompensated cirrhosis (etiology not clear, possibly autoimmune disease) with history of hepatic encephalopathy admitted with altered mental status .  Probable hepatic encephalopathy after sudden self discontinuation of lactulose  but also ? component of benzodiazepine withdrawal  MELD -Na was 22 on admission but elevated in setting of  Today: Mentation improved compared to yesterday.   AKI Low K+, hypernatremia.  Today :   potassium improving with repletion. Renal function improving ( creatinine down to 2.4, GFR up to 22). Na+ 150 today, discussed with primary MD, he is aware and likely due to using MD to using normal saline. IV fluids have been switched.   Fever ( earlier this am) Coughing / Rhonchi Temp 102.9 at 450am today, given Tylenol , no recurrent fever so not sure accurate.  She feels okay. Infectous workup in progress. Primary MD had already ordered Bld cultures  (negative at < 12 hrs). No ascites on CT scan this admission but still at risk for infections given cirrhosis  Principal Problem:   ARF (acute renal failure) Active Problems:   Leukocytosis   Elevated LFTs   Hypothyroidism   Hypokalemia   Hepatic cirrhosis (HCC)   Essential hypertension   Acute hepatic encephalopathy (HCC)   PLAN   --CXR, respiratory panel being ordered by primary team --Am electrolytes --Incentive spirometry --Continue lactulose   --Continue 2 grams Na restricted diet.  - Subjective   Throat feels dry and sore, coughing. No other complaints.   Objective     Recent Labs    05/28/24 1552 05/29/24 0259 05/30/24 0241  WBC 16.2* 12.9* 14.2*  HGB 12.1 11.0* 12.5  HCT 35.1* 29.8* 35.9*  MCV 85.4 81.9 84.9  PLT 376 291 279   No results for input(s): FOLATE, VITAMINB12, FERRITIN, TIBC, IRONPCTSAT in the last 72  hours. Recent Labs    05/28/24 1552 05/29/24 0259 05/30/24 0241 05/30/24 1200  NA 144 146* 150*  --   K 2.4* 2.2* 2.7* 3.3*  CL 102 106 115*  --   CO2 18* 21* 19*  --   GLUCOSE 126* 134* 127*  --   BUN 49* 43* 38*  --   CREATININE 4.50* 3.47* 2.46*  --   CALCIUM 9.8 9.2 9.1  --    Recent Labs    05/28/24 1552 05/29/24 0259  PROT 7.4 6.5  ALBUMIN 4.2 3.6  AST 136* 133*  ALT 48* 48*  ALKPHOS 276* 285*  BILITOT 1.4* 1.3*  BILIDIR  --  0.7*  IBILI  --  0.7      Imaging:  CT CHEST ABDOMEN PELVIS WO CONTRAST EXAM: CT CHEST, ABDOMEN AND PELVIS WITHOUT CONTRAST 05/28/2024 09:58:00 PM  TECHNIQUE: CT of the chest, abdomen and pelvis was performed without the administration of intravenous contrast. Multiplanar reformatted images are provided for review. Automated exposure control, iterative reconstruction, and/or weight based adjustment of the mA/kV was utilized to reduce the radiation dose to as low as reasonably achievable.  COMPARISON: Portable chest from today, CT abdomen and pelvis with contrast 09/18/2023 and 07/30/2021, and CTA chest abdomen and pelvis 02/10/2021.  CLINICAL HISTORY: Acute renal failure. The patient fell today and was found down by family, unknown down time.  FINDINGS:  CHEST:  MEDIASTINUM AND LYMPH NODES: Cardiac size is normal. There is no pericardial effusion. The pulmonary arteries and veins, aorta, and great vessels are normal  in caliber.  Small amount of calcific plaque in the aorta and great vessels. No visible coronary calcifications. The central airways are clear. No mediastinal, hilar or axillary lymphadenopathy.  LUNGS AND PLEURA: Mild reticulated scarring at the lung apices. Respiratory motion limits fine detail in the lungs.  There is chronic linear scarring in the left lower lobe.  Mild posterior atelectasis. There is no consolidation, effusion, pneumothorax, or visible nodules.  ABDOMEN AND PELVIS: Respiratory  motion limits fine detail in the abdomen.  LIVER: The liver is small and cirrhotic with a recanalized umbilical vein consistent with portal hypertension. No masses seen without contrast.  GALLBLADDER AND BILE DUCTS: The gallbladder is not well evaluated but is grossly unremarkable. No biliary ductal dilatation.  SPLEEN: The spleen is normal in size and attenuation.  PANCREAS: The pancreas is atrophic with no contour deforming mass or ductal dilatation.  ADRENAL GLANDS: There is no adrenal mass.  KIDNEYS, URETERS AND BLADDER: There is no contour-deforming renal mass. No stones in the kidneys or ureters. No hydronephrosis. No perinephric or periureteral stranding. Urinary bladder is unremarkable.  GI AND BOWEL: The stomach is contracted. The small bowel is normal caliber without inflammation.  The appendix is again noted absent. No findings of acute colitis or diverticulitis.  REPRODUCTIVE ORGANS: The uterus is absent. No adnexal mass.  PERITONEUM AND RETROPERITONEUM: No ascites. No free air.  VASCULATURE: Aorta is normal in caliber. There is aortoiliac calcific plaque without aneurysm.  ABDOMINAL AND PELVIS LYMPH NODES: No lymphadenopathy.  BONES AND SOFT TISSUES: Mild thoracic spine dextroscoliosis .no acute osseous findings in the thorax. Small umbilical fat hernia. There are degenerative changes of the lumbar spine greatest at L4-5 with advanced facet hypertrophy and grade 1 anterolisthesis.  No acute or significant osseous findings. There are varices in the lower abdominal wall draining to the proximal superficial femoral veins.  IMPRESSION: 1. No acute noncontrast CT findings in the chest, abdomen, or pelvis. Motion limited exam. 2. Cirrhotic liver with recanalized umbilical vein and lower abdominal wall varices consistent with portal hypertension. No splenomegaly or ascites. 3. . Chronic findings.  Electronically signed by: Francis Quam MD 05/28/2024  10:34 PM EST RP Workstation: [id number] DG Chest Portable 1 View CLINICAL DATA:  Unwitnessed fall.  Question pneumonia.  EXAM: PORTABLE CHEST 1 VIEW  COMPARISON:  09/18/2023  FINDINGS: The cardiomediastinal contours are normal. The lungs are clear. Pulmonary vasculature is normal. No consolidation, pleural effusion, or pneumothorax. No acute osseous abnormalities are seen.  IMPRESSION: No acute chest findings.  No evidence of pneumonia.  Electronically Signed   By: Andrea Gasman M.D.   On: 05/28/2024 17:24 CT HEAD WO CONTRAST EXAM: CT HEAD WITHOUT CONTRAST 05/28/2024 04:25:00 PM  TECHNIQUE: CT of the head was performed without the administration of intravenous contrast. Automated exposure control, iterative reconstruction, and/or weight based adjustment of the mA/kV was utilized to reduce the radiation dose to as low as reasonably achievable.  COMPARISON: CT head 02/09/2021  CLINICAL HISTORY: Head trauma, abnormal mental status (Age 71-64y)  FINDINGS:  BRAIN AND VENTRICLES: No acute hemorrhage. No evidence of acute infarct. No hydrocephalus. No extra-axial collection. No mass effect or midline shift.  ORBITS: No acute abnormality.  SINUSES: No acute abnormality.  SOFT TISSUES AND SKULL: No acute soft tissue abnormality. No skull fracture.  IMPRESSION: 1. No acute intracranial abnormality.  Electronically signed by: Gilmore Molt 05/28/2024 04:44 PM EST RP Workstation: [id number]     Scheduled inpatient medications:   ALPRAZolam   0.25 mg Oral BID  amLODipine   10 mg Oral Daily   DULoxetine   60 mg Oral Daily   lactulose   30 g Oral BID   levothyroxine   25 mcg Oral QAC breakfast   metoprolol  tartrate  25 mg Oral BID   sodium bicarbonate   1,300 mg Oral BID   venlafaxine  XR  75 mg Oral Q breakfast   Continuous inpatient infusions:   dextrose  5 % 1,000 mL with potassium chloride  40 mEq infusion 100 mL/hr at 05/30/24 0849   PRN inpatient  medications: metoCLOPramide   Vital signs in last 24 hours: Temp:  [98.1 F (36.7 C)-102.9 F (39.4 C)] 98.4 F (36.9 C) (12/27 0827) Pulse Rate:  [80-103] 80 (12/27 0827) Resp:  [18-20] 20 (12/27 0827) BP: (131-158)/(56-76) 131/56 (12/27 0827) SpO2:  [95 %-100 %] 95 % (12/27 0827) Last BM Date : 05/28/24  Intake/Output Summary (Last 24 hours) at 05/30/2024 1539 Last data filed at 05/30/2024 0849 Gross per 24 hour  Intake 900 ml  Output 0 ml  Net 900 ml    Intake/Output from previous day: 12/26 0701 - 12/27 0700 In: 1040 [P.O.:1040] Out: 0  Intake/Output this shift: Total I/O In: 200 [P.O.:200] Out: -    Physical Exam:  General: Alert female in NAD Heart:  Regular rate and rhythm.  Pulmonary: Coarse breath sounds bilaterally. No wheezes or crackles.  Abdomen: Soft, nondistended, nontender. Normal bowel sounds. Extremities: No extremity edema . Neurologic: Alert and oriented Psych: Pleasant. Cooperative     LOS: 2 days   Vina Dasen ,NP 05/30/2024, 3:39 PM       "

## 2024-05-31 DIAGNOSIS — K7682 Hepatic encephalopathy: Secondary | ICD-10-CM | POA: Diagnosis not present

## 2024-05-31 DIAGNOSIS — E8721 Acute metabolic acidosis: Secondary | ICD-10-CM

## 2024-05-31 DIAGNOSIS — K7290 Hepatic failure, unspecified without coma: Secondary | ICD-10-CM | POA: Diagnosis not present

## 2024-05-31 LAB — CBC WITH DIFFERENTIAL/PLATELET
Abs Immature Granulocytes: 0.07 K/uL (ref 0.00–0.07)
Basophils Absolute: 0.1 K/uL (ref 0.0–0.1)
Basophils Relative: 0 %
Eosinophils Absolute: 0.5 K/uL (ref 0.0–0.5)
Eosinophils Relative: 4 %
HCT: 33 % — ABNORMAL LOW (ref 36.0–46.0)
Hemoglobin: 11.3 g/dL — ABNORMAL LOW (ref 12.0–15.0)
Immature Granulocytes: 1 %
Lymphocytes Relative: 11 %
Lymphs Abs: 1.2 K/uL (ref 0.7–4.0)
MCH: 30.2 pg (ref 26.0–34.0)
MCHC: 34.2 g/dL (ref 30.0–36.0)
MCV: 88.2 fL (ref 80.0–100.0)
Monocytes Absolute: 1.1 K/uL — ABNORMAL HIGH (ref 0.1–1.0)
Monocytes Relative: 10 %
Neutro Abs: 8.4 K/uL — ABNORMAL HIGH (ref 1.7–7.7)
Neutrophils Relative %: 74 %
Platelets: 174 K/uL (ref 150–400)
RBC: 3.74 MIL/uL — ABNORMAL LOW (ref 3.87–5.11)
RDW: 16.4 % — ABNORMAL HIGH (ref 11.5–15.5)
WBC: 11.3 K/uL — ABNORMAL HIGH (ref 4.0–10.5)
nRBC: 0.3 % — ABNORMAL HIGH (ref 0.0–0.2)

## 2024-05-31 LAB — BASIC METABOLIC PANEL WITH GFR
Anion gap: 13 (ref 5–15)
BUN: 33 mg/dL — ABNORMAL HIGH (ref 8–23)
CO2: 14 mmol/L — ABNORMAL LOW (ref 22–32)
Calcium: 8.3 mg/dL — ABNORMAL LOW (ref 8.9–10.3)
Chloride: 105 mmol/L (ref 98–111)
Creatinine, Ser: 1.83 mg/dL — ABNORMAL HIGH (ref 0.44–1.00)
GFR, Estimated: 31 mL/min — ABNORMAL LOW
Glucose, Bld: 89 mg/dL (ref 70–99)
Potassium: 3.5 mmol/L (ref 3.5–5.1)
Sodium: 133 mmol/L — ABNORMAL LOW (ref 135–145)

## 2024-05-31 LAB — MAGNESIUM: Magnesium: 1.2 mg/dL — ABNORMAL LOW (ref 1.7–2.4)

## 2024-05-31 MED ORDER — MAGNESIUM SULFATE 4 GM/100ML IV SOLN
4.0000 g | Freq: Once | INTRAVENOUS | Status: AC
  Administered 2024-05-31: 4 g via INTRAVENOUS
  Filled 2024-05-31: qty 100

## 2024-05-31 MED ORDER — LACTULOSE 10 GM/15ML PO SOLN: 30.0000 g | Freq: Two times a day (BID) | ORAL | 0 refills | Status: DC

## 2024-05-31 MED ORDER — OSELTAMIVIR PHOSPHATE 30 MG PO CAPS: 30.0000 mg | ORAL_CAPSULE | Freq: Two times a day (BID) | ORAL | 0 refills | Status: AC

## 2024-05-31 MED ORDER — PHENOL 1.4 % MT LIQD
1.0000 | OROMUCOSAL | Status: DC | PRN
  Administered 2024-05-31: 1 via OROMUCOSAL
  Filled 2024-05-31: qty 177

## 2024-05-31 MED ORDER — MAGIC MOUTHWASH W/LIDOCAINE
5.0000 mL | Freq: Three times a day (TID) | ORAL | Status: DC | PRN
  Administered 2024-05-31: 5 mL via ORAL
  Filled 2024-05-31 (×2): qty 5

## 2024-05-31 MED ORDER — METOPROLOL TARTRATE 25 MG PO TABS: 25.0000 mg | ORAL_TABLET | Freq: Two times a day (BID) | ORAL | 0 refills | Status: DC

## 2024-05-31 MED ORDER — LEVOTHYROXINE SODIUM 25 MCG PO TABS: 25.0000 ug | ORAL_TABLET | Freq: Every day | ORAL | 0 refills | Status: DC

## 2024-05-31 MED ORDER — GERHARDT'S BUTT CREAM
TOPICAL_CREAM | Freq: Two times a day (BID) | CUTANEOUS | Status: DC
  Administered 2024-05-31: 1 via TOPICAL
  Filled 2024-05-31: qty 60

## 2024-05-31 MED ORDER — DULOXETINE HCL 30 MG PO CPEP: 30.0000 mg | ORAL_CAPSULE | Freq: Every day | ORAL | 0 refills | Status: DC

## 2024-05-31 MED ORDER — PHENOL 1.4 % MT LIQD: 1.0000 | OROMUCOSAL | Status: DC | PRN

## 2024-05-31 MED ORDER — ALPRAZOLAM 0.25 MG PO TABS: 0.2500 mg | ORAL_TABLET | Freq: Two times a day (BID) | ORAL | 0 refills | Status: DC

## 2024-05-31 MED ORDER — SODIUM BICARBONATE 650 MG PO TABS: 1300.0000 mg | ORAL_TABLET | Freq: Two times a day (BID) | ORAL | 0 refills | Status: AC

## 2024-05-31 NOTE — Plan of Care (Signed)

## 2024-05-31 NOTE — Progress Notes (Signed)
 "    Daily Progress Note  DOA: 05/28/2024 Hospital Day: 4  Cc: Cirrhosis, altered mental status  ASSESSMENT  & PLAN   63 year old female with history of decompensated cirrhosis (etiology not clear, possibly autoimmune disease) with history of hepatic encephalopathy admitted with altered mental status-probable hepatic encephalopathy after sudden self discontinuation of lactulose  but also ? component of benzodiazepine withdrawal  MELD -Na 22 on admit but high in setting of AKI  Mentation normal now. . Has been getting lactulose  30 g 2-3 times a day. Had 3 BMs yesterday according to I+O. Currently getting Lactulose  30 gram BID.  Continue BID Lactulose  for now. Upon discharge she may be able to resume home 20 g BID - she will need to titrate to 3 BMs daily  She will need to follow up with [facility] upon hospital discharge. GI will sign off, call for questions.    AKI Renal function continues to improve Patient was only taking HCTZ at home prior to admission. Appears cirrhosis not previously associated with significant amount of ascites.   Influneza A Sore throat Cough Coughing / chest congestion today. Had neg CXR yesterday. . No fever today. On Tamiflu  Will add Chloraseptic spray as needed for sore throat   Principal Problem:   ARF (acute renal failure) Active Problems:   Leukocytosis   Elevated LFTs   Hypothyroidism   Hypokalemia   Hepatic cirrhosis (HCC)   Essential hypertension   Acute hepatic encephalopathy (HCC)   Subjective:  Sore throat.  Recent Labs    05/29/24 0259 05/30/24 0241 05/31/24 0649  WBC 12.9* 14.2* 11.3*  HGB 11.0* 12.5 11.3*  HCT 29.8* 35.9* 33.0*  MCV 81.9 84.9 88.2  PLT 291 279 174   No results for input(s): FOLATE, VITAMINB12, FERRITIN, TIBC, IRONPCTSAT in the last 72 hours. Recent Labs    05/30/24 0241 05/30/24 1200 05/30/24 1904 05/31/24 0649  NA 150*  --  140 133*  K 2.7* 3.3* 3.8 3.5  CL 115*  --  111 105  CO2  19*  --  14* 14*  GLUCOSE 127*  --  92 89  BUN 38*  --  37* 33*  CREATININE 2.46*  --  2.10* 1.83*  CALCIUM 9.1  --  8.7* 8.3*   Recent Labs    05/28/24 1552 05/29/24 0259 05/30/24 1904  PROT 7.4 6.5 6.5  ALBUMIN 4.2 3.6 2.9*  AST 136* 133* 148*  ALT 48* 48* 55*  ALKPHOS 276* 285* 292*  BILITOT 1.4* 1.3* 1.5*  BILIDIR  --  0.7*  --   IBILI  --  0.7  --     Scheduled inpatient medications:   ALPRAZolam   0.25 mg Oral BID   amLODipine   10 mg Oral Daily   DULoxetine   60 mg Oral Daily   lactulose   30 g Oral BID   levothyroxine   25 mcg Oral QAC breakfast   metoprolol  tartrate  25 mg Oral BID   oseltamivir   30 mg Oral BID   sodium bicarbonate   1,300 mg Oral BID   venlafaxine  XR  75 mg Oral Q breakfast   Continuous inpatient infusions:   dextrose  5 % 1,000 mL with potassium chloride  40 mEq infusion 50 mL/hr at 05/31/24 0618   PRN inpatient medications: metoCLOPramide   Vital signs in last 24 hours: Temp:  [98 F (36.7 C)-98.9 F (37.2 C)] 98.6 F (37 C) (12/28 0825) Pulse Rate:  [81-95] 95 (12/28 0825) Resp:  [17-18] 18 (12/28 0523) BP: (100-127)/(61-75) 100/65 (12/28 0825)  SpO2:  [96 %-99 %] 96 % (12/28 0825) Last BM Date : 05/30/24  Intake/Output Summary (Last 24 hours) at 05/31/2024 1022 Last data filed at 05/31/2024 0600 Gross per 24 hour  Intake 2948.31 ml  Output 400 ml  Net 2548.31 ml    Intake/Output from previous day: 12/27 0701 - 12/28 0700 In: 3148.3 [P.O.:2040; I.V.:1108.3] Out: 400 [Urine:400] Intake/Output this shift: No intake/output data recorded.   Physical Exam:  General: Alert female in NAD. Speaks in a whisper due to sore throat Mouth: no definite candida. Scant amount of white residue on tongue. Tongue slightly dry Heart:  Regular rate and rhythm.  Pulmonary: Congested cough. No wheezes,. No respiratory distress.  Abdomen: Soft, nondistended, nontender. Normal bowel sounds. Extremities: No lower extremity edema  Neurologic: Alert and  oriented Psych: Pleasant. Cooperative     LOS: 3 days   Vina Dasen ,NP 05/31/2024, 10:22 AM       "

## 2024-05-31 NOTE — Plan of Care (Signed)
" °  Problem: Education: Goal: Knowledge of General Education information will improve Description: Including pain rating scale, medication(s)/side effects and non-pharmacologic comfort measures 05/31/2024 1641 by Emmitt Browning, RN Outcome: Adequate for Discharge 05/31/2024 1559 by Emmitt Browning, RN Outcome: Adequate for Discharge   Problem: Health Behavior/Discharge Planning: Goal: Ability to manage health-related needs will improve 05/31/2024 1641 by Emmitt Browning, RN Outcome: Adequate for Discharge 05/31/2024 1559 by Emmitt Browning, RN Outcome: Adequate for Discharge   Problem: Clinical Measurements: Goal: Ability to maintain clinical measurements within normal limits will improve 05/31/2024 1641 by Emmitt Browning, RN Outcome: Adequate for Discharge 05/31/2024 1559 by Emmitt Browning, RN Outcome: Adequate for Discharge Goal: Will remain free from infection 05/31/2024 1641 by Emmitt Browning, RN Outcome: Adequate for Discharge 05/31/2024 1559 by Emmitt Browning, RN Outcome: Adequate for Discharge Goal: Diagnostic test results will improve 05/31/2024 1641 by Emmitt Browning, RN Outcome: Adequate for Discharge 05/31/2024 1559 by Emmitt Browning, RN Outcome: Adequate for Discharge Goal: Respiratory complications will improve 05/31/2024 1641 by Emmitt Browning, RN Outcome: Adequate for Discharge 05/31/2024 1559 by Emmitt Browning, RN Outcome: Adequate for Discharge Goal: Cardiovascular complication will be avoided 05/31/2024 1641 by Emmitt Browning, RN Outcome: Adequate for Discharge 05/31/2024 1559 by Emmitt Browning, RN Outcome: Adequate for Discharge   Problem: Activity: Goal: Risk for activity intolerance will decrease 05/31/2024 1641 by Emmitt Browning, RN Outcome: Adequate for Discharge 05/31/2024 1559 by Emmitt Browning, RN Outcome: Adequate for Discharge   Problem: Nutrition: Goal: Adequate nutrition will be  maintained 05/31/2024 1641 by Emmitt Browning, RN Outcome: Adequate for Discharge 05/31/2024 1559 by Emmitt Browning, RN Outcome: Adequate for Discharge   Problem: Coping: Goal: Level of anxiety will decrease 05/31/2024 1641 by Emmitt Browning, RN Outcome: Adequate for Discharge 05/31/2024 1559 by Emmitt Browning, RN Outcome: Adequate for Discharge   Problem: Elimination: Goal: Will not experience complications related to bowel motility 05/31/2024 1641 by Emmitt Browning, RN Outcome: Adequate for Discharge 05/31/2024 1559 by Emmitt Browning, RN Outcome: Adequate for Discharge Goal: Will not experience complications related to urinary retention 05/31/2024 1641 by Emmitt Browning, RN Outcome: Adequate for Discharge 05/31/2024 1559 by Emmitt Browning, RN Outcome: Adequate for Discharge   Problem: Pain Managment: Goal: General experience of comfort will improve and/or be controlled 05/31/2024 1641 by Emmitt Browning, RN Outcome: Adequate for Discharge 05/31/2024 1559 by Emmitt Browning, RN Outcome: Adequate for Discharge   Problem: Safety: Goal: Ability to remain free from injury will improve 05/31/2024 1641 by Emmitt Browning, RN Outcome: Adequate for Discharge 05/31/2024 1559 by Emmitt Browning, RN Outcome: Adequate for Discharge   Problem: Skin Integrity: Goal: Risk for impaired skin integrity will decrease 05/31/2024 1641 by Emmitt Browning, RN Outcome: Adequate for Discharge 05/31/2024 1559 by Emmitt Browning, RN Outcome: Adequate for Discharge   "

## 2024-05-31 NOTE — Progress Notes (Signed)
 Patient's daughter picked patient up.  Discharge paperwork given to daughter.  All PIV removed.  All questions addressed.  Patient has personal belongings with her.  Suzen Ice RN

## 2024-05-31 NOTE — Discharge Summary (Addendum)
 " Physician Discharge Summary   Patient: [name] MRN: [id number] DOB: 04/13/1961  Admit date:     05/28/2024  Discharge date: 05/31/2024  Discharge Physician: Garnette Pelt   PCP: Bevely Doffing, FNP   Recommendations at discharge:    Follow up with PCP in 1-2 weeks Recommend recheck BMET in 1 week. Focus on electrolytes and renal function  Discharge Diagnoses: Principal Problem:   ARF (acute renal failure) Active Problems:   Leukocytosis   Elevated LFTs   Hypothyroidism   Hypokalemia   Hepatic cirrhosis (HCC)   Essential hypertension   Acute hepatic encephalopathy (HCC)  Resolved Problems:   * No resolved hospital problems. *  Hospital Course: 63 year old white female HTN hypothyroid bipolar chronic pain tobacco abuse Cirrhosis with ascites with prior paracentesis in the past-first diagnosed around 05/2017 and was followed by Dr. Bill--- last MELD score 05/08/2024 in the outpatient setting when followed with Dr. Ivonne office was 11 She has had equivocal testing with previously negative ANA then followed by positive ANA and an intermittently elevated ASMA Previous admission for hypertensive crisis intractable nausea probably from migraine versus gastroenteritis  Assessment and Plan:  Decompensated cirrhosis---autoimmune liver disease previously [facility]  Hepatic encephalopathy MELD score on arrival 25 AKI from poor p.o.-is hypertensive-unlikely hepatorenal syndrome Appreciate GI care. Lactulose  resumed Mentation normalized   One-time fever 103 secondary to influenza Flu test pos Tamiflu  initiated in hospital   Chronic pain/depression Recent stressor of loss of family member on Effexor  75 Robaxin  500 every 8 as needed Cymbalta  90 daily and baclofen  10 4 times daily Unlikely overmedicated on admit Patient was prescribed Xanax  11/6 ---her PCP Dr. Bertell changed and she was left without a place to refill over the past 2 weeks [facility] reviewed. Pt is confirmed to be  prescribed benzo chronically, overdue for refill at this time. Given concern of possible withdrawal symptoms, will provide limited quantity of xanax  for pt to f/u with provider for additional refills.    AKI with metabolic acidosis and elevated anion gap 24 Hypokalemia Hypovolemic hyponatremia -Urine sodium less than 30 urine creatinine less than 10 indicative likely volume depletion Recommend hold hydrochlorothiazide  while symptomatic from flu, resume when symptoms resolve Renal function did improve while in hospital    Consultants: GI Procedures performed:   Disposition: Home Diet recommendation:  Regular diet DISCHARGE MEDICATION: Allergies as of 05/31/2024       Reactions   Codeine Hives, Nausea And Vomiting, Other (See Comments)   Hallucinations   Complex [vitamins-lipotropics Er] Hives, Itching, Other (See Comments)   Patient cannot take any vitamins or supplements due to mental breakdowns, hallucinations, and other symptoms.    Nsaids Nausea And Vomiting, Other (See Comments)   Mental breakdown, depression.   Flexeril  [cyclobenzaprine ] Other (See Comments)   Hallucinations; patient states she does not recall the reaction to this medication or why it is on her list    Latex Hives, Itching   Lyrica [pregabalin] Hives, Itching        Medication List     PAUSE taking these medications    hydrochlorothiazide  12.5 MG capsule Wait to take this until: June 06, 2024 Commonly known as: MICROZIDE  Take 1 capsule (12.5 mg total) by mouth daily.       STOP taking these medications    methocarbamol  500 MG tablet Commonly known as: ROBAXIN    zolpidem  5 MG tablet Commonly known as: AMBIEN        TAKE these medications    acetaminophen  500 MG tablet  Commonly known as: TYLENOL  Take 1,000 mg by mouth every 6 (six) hours as needed for mild pain (pain score 1-3) or headache.   amLODipine  10 MG tablet Commonly known as: NORVASC  Take 1 tablet (10 mg total) by mouth  daily.   baclofen  10 MG tablet Commonly known as: LIORESAL  Take 10 mg by mouth 4 (four) times daily as needed for muscle spasms.   diphenoxylate-atropine 2.5-0.025 MG tablet Commonly known as: LOMOTIL Take 2 tablets by mouth 4 (four) times daily. What changed:  when to take this reasons to take this   DULoxetine  30 MG capsule Commonly known as: CYMBALTA  Take 1 capsule (30 mg total) by mouth daily. What changed: Another medication with the same name was removed. Continue taking this medication, and follow the directions you see here.   estradiol  0.05 mg/24hr patch Commonly known as: CLIMARA  - Dosed in mg/24 hr Place 1 patch (0.05 mg total) onto the skin once a week.   lactulose  10 GM/15ML solution Commonly known as: CHRONULAC  Take 45 mLs (30 g total) by mouth 2 (two) times daily. What changed: how much to take   levothyroxine  25 MCG tablet Commonly known as: SYNTHROID  Take 1 tablet (25 mcg total) by mouth daily before breakfast.   metoCLOPramide  5 MG tablet Commonly known as: REGLAN  Take 5 mg by mouth every 6 (six) hours as needed for nausea or vomiting.   metoprolol  tartrate 25 MG tablet Commonly known as: LOPRESSOR  Take 1 tablet (25 mg total) by mouth 2 (two) times daily.   ondansetron  4 MG disintegrating tablet Commonly known as: ZOFRAN -ODT Take 4 mg by mouth every 8 (eight) hours as needed for nausea or vomiting.   oseltamivir  30 MG capsule Commonly known as: TAMIFLU  Take 1 capsule (30 mg total) by mouth 2 (two) times daily for 5 days.   pantoprazole  40 MG tablet Commonly known as: PROTONIX  Take 1 tablet (40 mg total) by mouth 2 (two) times daily before a meal. What changed:  when to take this reasons to take this   phenol 1.4 % Liqd Commonly known as: CHLORASEPTIC Use as directed 1 spray in the mouth or throat as needed for throat irritation / pain.   promethazine  25 MG tablet Commonly known as: PHENERGAN  Take 25 mg by mouth 4 (four) times daily as  needed.   sodium bicarbonate  650 MG tablet Take 2 tablets (1,300 mg total) by mouth 2 (two) times daily for 7 days.   venlafaxine  XR 75 MG 24 hr capsule Commonly known as: EFFEXOR -XR Take 1 capsule (75 mg total) by mouth daily with breakfast.        Follow-up Information     Bevely Doffing, FNP Follow up in 2 week(s).   Specialty: Family Medicine Why: Hospital follow up Contact information: [address] [place] [address] [phone]                Discharge Exam: [name]   05/28/24 1500  Weight: 90.7 kg   General exam: Awake, laying in bed, in nad Respiratory system: Normal respiratory effort, no wheezing Cardiovascular system: regular rate, s1, s2 Gastrointestinal system: Soft, nondistended, positive BS Central nervous system: CN2-12 grossly intact, strength intact Extremities: Perfused, no clubbing Skin: Normal skin turgor, no notable skin lesions seen Psychiatry: Mood normal // no visual hallucinations   Condition at discharge: fair  The results of significant diagnostics from this hospitalization (including imaging, microbiology, ancillary and laboratory) are listed below for reference.   Imaging Studies: DG  CHEST PORT 1 VIEW Result Date: 05/30/2024 EXAM: 1 VIEW(S) XRAY OF THE CHEST 05/30/2024 05:29:00 PM COMPARISON: 05/28/2024 CLINICAL HISTORY: PNA (pneumonia) FINDINGS: LUNGS AND PLEURA: No focal pulmonary opacity. No pleural effusion. No pneumothorax. HEART AND MEDIASTINUM: No acute abnormality of the cardiac and mediastinal silhouettes. BONES AND SOFT TISSUES: No acute osseous abnormality. IMPRESSION: 1. No acute process. Electronically signed by: Morgane Naveau MD 05/30/2024 06:35 PM EST RP Workstation: [id number]   CT CHEST ABDOMEN PELVIS WO CONTRAST Result Date: 05/28/2024 EXAM: CT CHEST, ABDOMEN AND PELVIS WITHOUT CONTRAST 05/28/2024 09:58:00 PM TECHNIQUE: CT of the chest, abdomen and pelvis was performed without the  administration of intravenous contrast. Multiplanar reformatted images are provided for review. Automated exposure control, iterative reconstruction, and/or weight based adjustment of the mA/kV was utilized to reduce the radiation dose to as low as reasonably achievable. COMPARISON: Portable chest from today, CT abdomen and pelvis with contrast 09/18/2023 and 07/30/2021, and CTA chest abdomen and pelvis 02/10/2021. CLINICAL HISTORY: Acute renal failure. The patient fell today and was found down by family, unknown down time. FINDINGS: CHEST: MEDIASTINUM AND LYMPH NODES: Cardiac size is normal. There is no pericardial effusion. The pulmonary arteries and veins, aorta, and great vessels are normal in caliber. Small amount of calcific plaque in the aorta and great vessels. No visible coronary calcifications. The central airways are clear. No mediastinal, hilar or axillary lymphadenopathy. LUNGS AND PLEURA: Mild reticulated scarring at the lung apices. Respiratory motion limits fine detail in the lungs. There is chronic linear scarring in the left lower lobe. Mild posterior atelectasis. There is no consolidation, effusion, pneumothorax, or visible nodules. ABDOMEN AND PELVIS: Respiratory motion limits fine detail in the abdomen. LIVER: The liver is small and cirrhotic with a recanalized umbilical vein consistent with portal hypertension. No masses seen without contrast. GALLBLADDER AND BILE DUCTS: The gallbladder is not well evaluated but is grossly unremarkable. No biliary ductal dilatation. SPLEEN: The spleen is normal in size and attenuation. PANCREAS: The pancreas is atrophic with no contour deforming mass or ductal dilatation. ADRENAL GLANDS: There is no adrenal mass. KIDNEYS, URETERS AND BLADDER: There is no contour-deforming renal mass. No stones in the kidneys or ureters. No hydronephrosis. No perinephric or periureteral stranding. Urinary bladder is unremarkable. GI AND BOWEL: The stomach is contracted. The  small bowel is normal caliber without inflammation. The appendix is again noted absent. No findings of acute colitis or diverticulitis. REPRODUCTIVE ORGANS: The uterus is absent. No adnexal mass. PERITONEUM AND RETROPERITONEUM: No ascites. No free air. VASCULATURE: Aorta is normal in caliber. There is aortoiliac calcific plaque without aneurysm. ABDOMINAL AND PELVIS LYMPH NODES: No lymphadenopathy. BONES AND SOFT TISSUES: Mild thoracic spine dextroscoliosis .no acute osseous findings in the thorax. Small umbilical fat hernia. There are degenerative changes of the lumbar spine greatest at L4-5 with advanced facet hypertrophy and grade 1 anterolisthesis. No acute or significant osseous findings. There are varices in the lower abdominal wall draining to the proximal superficial femoral veins. IMPRESSION: 1. No acute noncontrast CT findings in the chest, abdomen, or pelvis. Motion limited exam. 2. Cirrhotic liver with recanalized umbilical vein and lower abdominal wall varices consistent with portal hypertension. No splenomegaly or ascites. 3. . Chronic findings. Electronically signed by: Francis Quam MD 05/28/2024 10:34 PM EST RP Workstation: [id number]   DG Chest Portable 1 View Result Date: 05/28/2024 CLINICAL DATA:  Unwitnessed fall.  Question pneumonia. EXAM: PORTABLE CHEST 1 VIEW COMPARISON:  09/18/2023 FINDINGS: The cardiomediastinal contours are normal. The lungs are clear. Pulmonary  vasculature is normal. No consolidation, pleural effusion, or pneumothorax. No acute osseous abnormalities are seen. IMPRESSION: No acute chest findings.  No evidence of pneumonia. Electronically Signed   By: Andrea Gasman M.D.   On: 05/28/2024 17:24   CT HEAD WO CONTRAST Result Date: 05/28/2024 EXAM: CT HEAD WITHOUT CONTRAST 05/28/2024 04:25:00 PM TECHNIQUE: CT of the head was performed without the administration of intravenous contrast. Automated exposure control, iterative reconstruction, and/or weight based  adjustment of the mA/kV was utilized to reduce the radiation dose to as low as reasonably achievable. COMPARISON: CT head 02/09/2021 CLINICAL HISTORY: Head trauma, abnormal mental status (Age 70-64y) FINDINGS: BRAIN AND VENTRICLES: No acute hemorrhage. No evidence of acute infarct. No hydrocephalus. No extra-axial collection. No mass effect or midline shift. ORBITS: No acute abnormality. SINUSES: No acute abnormality. SOFT TISSUES AND SKULL: No acute soft tissue abnormality. No skull fracture. IMPRESSION: 1. No acute intracranial abnormality. Electronically signed by: Gilmore Molt 05/28/2024 04:44 PM EST RP Workstation: [id number]   US  ABDOMEN LIMITED RUQ (LIVER/GB) Result Date: 05/07/2024 CLINICAL DATA:  cirrhosis, ruq pain EXAM: ULTRASOUND ABDOMEN LIMITED RIGHT UPPER QUADRANT COMPARISON:  September 18, 2023, May 25, 2022 FINDINGS: Gallbladder: No gallstones or wall thickening visualized. No sonographic Murphy sign noted by sonographer. Common bile duct: Diameter: Visualized portion measures 7 mm, the upper limits of normal. Liver: No focal lesion identified. Heterogeneous and coarsened in parenchymal echogenicity with a nodular liver contour. Portal vein is patent on color Doppler imaging with normal direction of blood flow towards the liver. Recanalized paraumbilical vein. Other: None. IMPRESSION: 1. Cirrhotic liver morphology. No focal lesion identified. 2. No sonographic etiology for right upper quadrant pain identified. Electronically Signed   By: Corean Salter M.D.   On: 05/07/2024 13:43    Microbiology: Results for orders placed or performed during the hospital encounter of 05/28/24  Resp panel by RT-PCR (RSV, Flu A&B, Covid) Anterior Nasal Swab     Status: None   Collection Time: 05/28/24  4:16 PM   Specimen: Anterior Nasal Swab  Result Value Ref Range Status   SARS Coronavirus 2 by RT PCR NEGATIVE NEGATIVE Final   Influenza A by PCR NEGATIVE NEGATIVE Final   Influenza B by PCR  NEGATIVE NEGATIVE Final    Comment: (NOTE) The Xpert Xpress SARS-CoV-2/FLU/RSV plus assay is intended as an aid in the diagnosis of influenza from Nasopharyngeal swab specimens and should not be used as a sole basis for treatment. Nasal washings and aspirates are unacceptable for Xpert Xpress SARS-CoV-2/FLU/RSV testing.  Fact Sheet for Patients: bloggercourse.com  Fact Sheet for Healthcare Providers: seriousbroker.it  This test is not yet approved or cleared by the [place]  FDA and has been authorized for detection and/or diagnosis of SARS-CoV-2 by FDA under an Emergency Use Authorization (EUA). This EUA will remain in effect (meaning this test can be used) for the duration of the COVID-19 declaration under Section 564(b)(1) of the Act, 21 U.S.C. section 360bbb-3(b)(1), unless the authorization is terminated or revoked.     Resp Syncytial Virus by PCR NEGATIVE NEGATIVE Final    Comment: (NOTE) Fact Sheet for Patients: bloggercourse.com  Fact Sheet for Healthcare Providers: seriousbroker.it  This test is not yet approved or cleared by the [place]  FDA and has been authorized for detection and/or diagnosis of SARS-CoV-2 by FDA under an Emergency Use Authorization (EUA). This EUA will remain in effect (meaning this test can be used) for the duration of the COVID-19 declaration under Section 564(b)(1) of the Act, 21 U.S.C.  section 360bbb-3(b)(1), unless the authorization is terminated or revoked.  Performed at [facility], [address] [place], [place] [address]   Culture, blood (Routine X 2) w Reflex to ID Panel     Status: None (Preliminary result)   Collection Time: 05/30/24  6:38 AM   Specimen: BLOOD LEFT ARM  Result Value Ref Range Status   Specimen Description BLOOD LEFT ARM  Final   Special Requests   Final    BOTTLES DRAWN AEROBIC AND ANAEROBIC  Blood Culture results may not be optimal due to an inadequate volume of blood received in culture bottles   Culture   Final    NO GROWTH 1 DAY Performed at [facility], [address], [place], [place] [address]    Report Status PENDING  Incomplete  Culture, blood (Routine X 2) w Reflex to ID Panel     Status: None (Preliminary result)   Collection Time: 05/30/24  6:38 AM   Specimen: BLOOD RIGHT HAND  Result Value Ref Range Status   Specimen Description BLOOD RIGHT HAND  Final   Special Requests   Final    BOTTLES DRAWN AEROBIC AND ANAEROBIC Blood Culture results may not be optimal due to an inadequate volume of blood received in culture bottles   Culture   Final    NO GROWTH 1 DAY Performed at [facility], [address] [place], [place] [address]    Report Status PENDING  Incomplete  Respiratory (~20 pathogens) panel by PCR     Status: Abnormal   Collection Time: 05/30/24  5:21 PM   Specimen: Nasopharyngeal Swab; Respiratory  Result Value Ref Range Status   Adenovirus NOT DETECTED NOT DETECTED Final   Coronavirus 229E NOT DETECTED NOT DETECTED Final    Comment: (NOTE) The Coronavirus on the Respiratory Panel, DOES NOT test for the novel  Coronavirus (2019 nCoV)    Coronavirus HKU1 NOT DETECTED NOT DETECTED Final   Coronavirus NL63 NOT DETECTED NOT DETECTED Final   Coronavirus OC43 NOT DETECTED NOT DETECTED Final   Metapneumovirus NOT DETECTED NOT DETECTED Final   Rhinovirus / Enterovirus NOT DETECTED NOT DETECTED Final   Influenza A H3 DETECTED (A) NOT DETECTED Final   Influenza B NOT DETECTED NOT DETECTED Final   Parainfluenza Virus 1 NOT DETECTED NOT DETECTED Final   Parainfluenza Virus 2 NOT DETECTED NOT DETECTED Final   Parainfluenza Virus 3 NOT DETECTED NOT DETECTED Final   Parainfluenza Virus 4 NOT DETECTED NOT DETECTED Final   Respiratory Syncytial Virus NOT DETECTED NOT DETECTED Final   Bordetella pertussis NOT DETECTED NOT DETECTED Final   Bordetella  Parapertussis NOT DETECTED NOT DETECTED Final   Chlamydophila pneumoniae NOT DETECTED NOT DETECTED Final   Mycoplasma pneumoniae NOT DETECTED NOT DETECTED Final    Comment: Performed at [facility], [address] [place], [place] [address]    Labs: CBC: Recent Labs  Lab 05/28/24 1552 05/29/24 0259 05/30/24 0241 05/31/24 0649  WBC 16.2* 12.9* 14.2* 11.3*  NEUTROABS 12.3* 10.5* 12.2* 8.4*  HGB 12.1 11.0* 12.5 11.3*  HCT 35.1* 29.8* 35.9* 33.0*  MCV 85.4 81.9 84.9 88.2  PLT 376 291 279 174   Basic Metabolic Panel: Recent Labs  Lab 05/28/24 1552 05/29/24 0259 05/30/24 0241 05/30/24 1200 05/30/24 1904 05/31/24 0649  NA 144 146* 150*  --  140 133*  K 2.4* 2.2* 2.7* 3.3* 3.8 3.5  CL 102 106 115*  --  111 105  CO2 18*  21* 19*  --  14* 14*  GLUCOSE 126* 134* 127*  --  92 89  BUN 49* 43* 38*  --  37* 33*  CREATININE 4.50* 3.47* 2.46*  --  2.10* 1.83*  CALCIUM 9.8 9.2 9.1  --  8.7* 8.3*  MG  --  2.0  --   --   --  1.2*   Liver Function Tests: Recent Labs  Lab 05/28/24 1552 05/29/24 0259 05/30/24 1904  AST 136* 133* 148*  ALT 48* 48* 55*  ALKPHOS 276* 285* 292*  BILITOT 1.4* 1.3* 1.5*  PROT 7.4 6.5 6.5  ALBUMIN 4.2 3.6 2.9*   CBG: Recent Labs  Lab 05/29/24 0820  GLUCAP 127*    Discharge time spent: less than 30 minutes.  Signed: Garnette Pelt, MD [facility] 05/31/2024 "

## 2024-06-04 LAB — CULTURE, BLOOD (ROUTINE X 2)
Culture: NO GROWTH
Culture: NO GROWTH

## 2024-06-08 DIAGNOSIS — E538 Deficiency of other specified B group vitamins: Secondary | ICD-10-CM

## 2024-06-08 DIAGNOSIS — K7460 Unspecified cirrhosis of liver: Secondary | ICD-10-CM

## 2024-06-09 ENCOUNTER — Telehealth: Payer: Self-pay

## 2024-06-09 NOTE — Telephone Encounter (Signed)
 Copied from CRM #[id number]. Topic: General - Other >> Jun 09, 2024  8:39 AM Eva FALCON wrote: Reason for CRM: Wolm Jarred from [facility] home was calling in to report the death of this patient. But also states when he enters Leita Longs name in the [facility] dave system it says she cannot be found. States he needs a call back so that he can create a death certificate for this patient. Please call [phone].

## 2024-06-09 NOTE — Telephone Encounter (Signed)
 I have now been added to [employer], so if someone could reach out to this funeral home and let them know that I should be in there.  Thanks!

## 2024-06-10 ENCOUNTER — Telehealth: Payer: Self-pay

## 2024-06-10 NOTE — Telephone Encounter (Signed)
 Called funeral home and someone will be calling me back this pm to let me know whose name is currently on the death cert and if it can be changed back to Leita to go in and sign

## 2024-06-10 NOTE — Telephone Encounter (Signed)
 I did receive an email about patient from [employer].  I need the patient's SS# to complete form on [employer] website, but I don't have access to this information.

## 2024-06-10 NOTE — Telephone Encounter (Signed)
 Sorry to hear this. Please cancel referral to [facility].

## 2024-06-10 NOTE — Telephone Encounter (Signed)
 FYI: This patient is deceased as of Jul 08, 2024.

## 2024-06-11 NOTE — Telephone Encounter (Signed)
 SS number obtained and called [facility] and they updated the info on [place] dave so the provider is able to complete the certification and sign

## 2024-06-23 ENCOUNTER — Telehealth: Payer: Self-pay

## 2024-06-23 NOTE — Telephone Encounter (Signed)
 [facility] [employer] calling needs death certificate signed it was sent in not signed. Needs the electronic signature.

## 2024-07-05 DEATH — deceased

## 2024-07-14 ENCOUNTER — Ambulatory Visit

## 2024-10-19 ENCOUNTER — Ambulatory Visit
